# Patient Record
Sex: Female | Born: 1960 | Race: White | Hispanic: No | Smoking: Former smoker
Health system: Southern US, Community
[De-identification: ages and names within clinical notes are randomized; demographics above are authoritative.]

## PROBLEM LIST (undated history)

## (undated) DIAGNOSIS — G8929 Other chronic pain: Secondary | ICD-10-CM

## (undated) DIAGNOSIS — Z124 Encounter for screening for malignant neoplasm of cervix: Secondary | ICD-10-CM

## (undated) DIAGNOSIS — T7840XA Allergy, unspecified, initial encounter: Secondary | ICD-10-CM

## (undated) DIAGNOSIS — Z860100 Personal history of colon polyps, unspecified: Secondary | ICD-10-CM

## (undated) DIAGNOSIS — M1990 Unspecified osteoarthritis, unspecified site: Secondary | ICD-10-CM

## (undated) DIAGNOSIS — M109 Gout, unspecified: Secondary | ICD-10-CM

## (undated) DIAGNOSIS — Z0000 Encounter for general adult medical examination without abnormal findings: Secondary | ICD-10-CM

## (undated) DIAGNOSIS — M549 Dorsalgia, unspecified: Secondary | ICD-10-CM

## (undated) DIAGNOSIS — N83201 Unspecified ovarian cyst, right side: Secondary | ICD-10-CM

## (undated) DIAGNOSIS — F419 Anxiety disorder, unspecified: Secondary | ICD-10-CM

## (undated) DIAGNOSIS — M25561 Pain in right knee: Secondary | ICD-10-CM

## (undated) DIAGNOSIS — G4700 Insomnia, unspecified: Secondary | ICD-10-CM

## (undated) DIAGNOSIS — F329 Major depressive disorder, single episode, unspecified: Secondary | ICD-10-CM

## (undated) DIAGNOSIS — Z1239 Encounter for other screening for malignant neoplasm of breast: Secondary | ICD-10-CM

## (undated) DIAGNOSIS — F4310 Post-traumatic stress disorder, unspecified: Secondary | ICD-10-CM

## (undated) DIAGNOSIS — E559 Vitamin D deficiency, unspecified: Secondary | ICD-10-CM

## (undated) DIAGNOSIS — F418 Other specified anxiety disorders: Secondary | ICD-10-CM

## (undated) DIAGNOSIS — F32A Depression, unspecified: Secondary | ICD-10-CM

## (undated) DIAGNOSIS — G039 Meningitis, unspecified: Secondary | ICD-10-CM

## (undated) DIAGNOSIS — M79671 Pain in right foot: Secondary | ICD-10-CM

## (undated) DIAGNOSIS — J069 Acute upper respiratory infection, unspecified: Secondary | ICD-10-CM

## (undated) DIAGNOSIS — I10 Essential (primary) hypertension: Secondary | ICD-10-CM

## (undated) DIAGNOSIS — Z8739 Personal history of other diseases of the musculoskeletal system and connective tissue: Secondary | ICD-10-CM

## (undated) DIAGNOSIS — N83202 Unspecified ovarian cyst, left side: Secondary | ICD-10-CM

## (undated) HISTORY — DX: Dorsalgia, unspecified: M54.9

## (undated) HISTORY — DX: Vitamin D deficiency, unspecified: E55.9

## (undated) HISTORY — DX: Personal history of other diseases of the musculoskeletal system and connective tissue: Z87.39

## (undated) HISTORY — PX: COLONOSCOPY: SHX174

## (undated) HISTORY — DX: Morbid (severe) obesity due to excess calories: E66.01

## (undated) HISTORY — DX: Pain in right foot: M79.671

## (undated) HISTORY — DX: Allergy, unspecified, initial encounter: T78.40XA

## (undated) HISTORY — DX: Anxiety disorder, unspecified: F41.9

## (undated) HISTORY — DX: Unspecified ovarian cyst, right side: N83.201

## (undated) HISTORY — DX: Personal history of colonic polyps: Z86.010

## (undated) HISTORY — DX: Depression, unspecified: F32.A

## (undated) HISTORY — DX: Encounter for general adult medical examination without abnormal findings: Z00.00

## (undated) HISTORY — DX: Post-traumatic stress disorder, unspecified: F43.10

## (undated) HISTORY — DX: Other specified anxiety disorders: F41.8

## (undated) HISTORY — DX: Unspecified osteoarthritis, unspecified site: M19.90

## (undated) HISTORY — PX: OVARIAN CYST REMOVAL: SHX89

## (undated) HISTORY — DX: Gout, unspecified: M10.9

## (undated) HISTORY — DX: Other chronic pain: G89.29

## (undated) HISTORY — DX: Encounter for screening for malignant neoplasm of cervix: Z12.4

## (undated) HISTORY — DX: Insomnia, unspecified: G47.00

## (undated) HISTORY — DX: Personal history of colon polyps, unspecified: Z86.0100

## (undated) HISTORY — DX: Meningitis, unspecified: G03.9

## (undated) HISTORY — DX: Major depressive disorder, single episode, unspecified: F32.9

## (undated) HISTORY — DX: Essential (primary) hypertension: I10

## (undated) HISTORY — DX: Unspecified ovarian cyst, left side: N83.202

## (undated) HISTORY — DX: Acute upper respiratory infection, unspecified: J06.9

## (undated) HISTORY — DX: Encounter for other screening for malignant neoplasm of breast: Z12.39

## (undated) HISTORY — DX: Pain in right knee: M25.561

---

## 1999-03-28 ENCOUNTER — Other Ambulatory Visit: Admission: RE | Admit: 1999-03-28 | Discharge: 1999-03-28 | Payer: Self-pay | Admitting: *Deleted

## 1999-04-09 ENCOUNTER — Ambulatory Visit (HOSPITAL_COMMUNITY): Admission: RE | Admit: 1999-04-09 | Discharge: 1999-04-09 | Payer: Self-pay | Admitting: *Deleted

## 1999-04-23 ENCOUNTER — Ambulatory Visit (HOSPITAL_COMMUNITY): Admission: RE | Admit: 1999-04-23 | Discharge: 1999-04-23 | Payer: Self-pay | Admitting: *Deleted

## 2000-03-31 ENCOUNTER — Other Ambulatory Visit: Admission: RE | Admit: 2000-03-31 | Discharge: 2000-03-31 | Payer: Self-pay | Admitting: *Deleted

## 2001-05-30 ENCOUNTER — Encounter: Payer: Self-pay | Admitting: Emergency Medicine

## 2001-05-30 ENCOUNTER — Emergency Department (HOSPITAL_COMMUNITY): Admission: EM | Admit: 2001-05-30 | Discharge: 2001-05-30 | Payer: Self-pay | Admitting: *Deleted

## 2002-12-23 ENCOUNTER — Emergency Department (HOSPITAL_COMMUNITY): Admission: EM | Admit: 2002-12-23 | Discharge: 2002-12-23 | Payer: Self-pay | Admitting: Emergency Medicine

## 2002-12-23 ENCOUNTER — Encounter: Payer: Self-pay | Admitting: Emergency Medicine

## 2003-07-14 ENCOUNTER — Encounter: Payer: Self-pay | Admitting: Emergency Medicine

## 2003-07-14 ENCOUNTER — Emergency Department (HOSPITAL_COMMUNITY): Admission: AC | Admit: 2003-07-14 | Discharge: 2003-07-14 | Payer: Self-pay

## 2004-02-09 ENCOUNTER — Other Ambulatory Visit: Admission: RE | Admit: 2004-02-09 | Discharge: 2004-02-09 | Payer: Self-pay | Admitting: Obstetrics and Gynecology

## 2004-03-12 ENCOUNTER — Ambulatory Visit (HOSPITAL_COMMUNITY): Admission: RE | Admit: 2004-03-12 | Discharge: 2004-03-12 | Payer: Self-pay | Admitting: Obstetrics and Gynecology

## 2005-11-20 ENCOUNTER — Ambulatory Visit: Payer: Self-pay | Admitting: Family Medicine

## 2007-02-09 ENCOUNTER — Ambulatory Visit: Payer: Self-pay | Admitting: Family Medicine

## 2007-07-15 ENCOUNTER — Telehealth (INDEPENDENT_AMBULATORY_CARE_PROVIDER_SITE_OTHER): Payer: Self-pay | Admitting: *Deleted

## 2007-07-21 ENCOUNTER — Telehealth: Payer: Self-pay | Admitting: Family Medicine

## 2007-07-22 ENCOUNTER — Telehealth: Payer: Self-pay | Admitting: Family Medicine

## 2007-08-17 ENCOUNTER — Telehealth: Payer: Self-pay | Admitting: Family Medicine

## 2008-03-09 ENCOUNTER — Ambulatory Visit: Payer: Self-pay | Admitting: Family Medicine

## 2008-03-09 DIAGNOSIS — M5416 Radiculopathy, lumbar region: Secondary | ICD-10-CM | POA: Insufficient documentation

## 2008-03-09 DIAGNOSIS — F411 Generalized anxiety disorder: Secondary | ICD-10-CM | POA: Insufficient documentation

## 2008-03-09 DIAGNOSIS — G4700 Insomnia, unspecified: Secondary | ICD-10-CM | POA: Insufficient documentation

## 2008-03-09 DIAGNOSIS — M545 Low back pain: Secondary | ICD-10-CM

## 2008-04-05 ENCOUNTER — Emergency Department (HOSPITAL_BASED_OUTPATIENT_CLINIC_OR_DEPARTMENT_OTHER): Admission: EM | Admit: 2008-04-05 | Discharge: 2008-04-05 | Payer: Self-pay | Admitting: Emergency Medicine

## 2008-04-05 ENCOUNTER — Telehealth: Payer: Self-pay | Admitting: Family Medicine

## 2008-04-06 ENCOUNTER — Telehealth: Payer: Self-pay | Admitting: Family Medicine

## 2008-04-07 ENCOUNTER — Telehealth: Payer: Self-pay | Admitting: Family Medicine

## 2008-04-11 ENCOUNTER — Telehealth: Payer: Self-pay | Admitting: Family Medicine

## 2008-05-18 ENCOUNTER — Telehealth: Payer: Self-pay | Admitting: Family Medicine

## 2008-05-24 ENCOUNTER — Ambulatory Visit: Payer: Self-pay | Admitting: Family Medicine

## 2008-05-24 ENCOUNTER — Telehealth: Payer: Self-pay | Admitting: Family Medicine

## 2008-08-23 ENCOUNTER — Ambulatory Visit: Payer: Self-pay | Admitting: Family Medicine

## 2008-08-23 DIAGNOSIS — F418 Other specified anxiety disorders: Secondary | ICD-10-CM | POA: Insufficient documentation

## 2008-08-23 HISTORY — DX: Other specified anxiety disorders: F41.8

## 2008-09-21 ENCOUNTER — Telehealth: Payer: Self-pay | Admitting: Family Medicine

## 2008-10-05 ENCOUNTER — Telehealth: Payer: Self-pay | Admitting: Family Medicine

## 2008-10-12 ENCOUNTER — Ambulatory Visit: Payer: Self-pay | Admitting: Family Medicine

## 2008-10-12 DIAGNOSIS — E039 Hypothyroidism, unspecified: Secondary | ICD-10-CM | POA: Insufficient documentation

## 2008-10-17 ENCOUNTER — Telehealth: Payer: Self-pay | Admitting: Family Medicine

## 2008-10-31 ENCOUNTER — Telehealth: Payer: Self-pay | Admitting: Family Medicine

## 2008-11-11 ENCOUNTER — Encounter: Payer: Self-pay | Admitting: Family Medicine

## 2008-11-14 ENCOUNTER — Telehealth: Payer: Self-pay | Admitting: Family Medicine

## 2008-11-15 ENCOUNTER — Telehealth: Payer: Self-pay | Admitting: Family Medicine

## 2008-11-28 ENCOUNTER — Ambulatory Visit: Payer: Self-pay | Admitting: Family Medicine

## 2008-11-28 DIAGNOSIS — R0789 Other chest pain: Secondary | ICD-10-CM | POA: Insufficient documentation

## 2008-11-28 DIAGNOSIS — E785 Hyperlipidemia, unspecified: Secondary | ICD-10-CM | POA: Insufficient documentation

## 2008-11-28 DIAGNOSIS — R079 Chest pain, unspecified: Secondary | ICD-10-CM | POA: Insufficient documentation

## 2008-12-06 ENCOUNTER — Ambulatory Visit: Payer: Self-pay | Admitting: Cardiovascular Disease

## 2008-12-07 ENCOUNTER — Ambulatory Visit: Payer: Self-pay

## 2008-12-14 ENCOUNTER — Ambulatory Visit: Payer: Self-pay

## 2008-12-14 ENCOUNTER — Encounter: Payer: Self-pay | Admitting: Cardiovascular Disease

## 2008-12-18 ENCOUNTER — Encounter: Payer: Self-pay | Admitting: Cardiovascular Disease

## 2008-12-18 ENCOUNTER — Ambulatory Visit: Payer: Self-pay | Admitting: Cardiovascular Disease

## 2008-12-18 DIAGNOSIS — R609 Edema, unspecified: Secondary | ICD-10-CM | POA: Insufficient documentation

## 2008-12-18 DIAGNOSIS — R002 Palpitations: Secondary | ICD-10-CM | POA: Insufficient documentation

## 2008-12-19 LAB — CONVERTED CEMR LAB
BUN: 11 mg/dL (ref 6–23)
CO2: 32 meq/L (ref 19–32)
Calcium: 10.2 mg/dL (ref 8.4–10.5)
Chloride: 101 meq/L (ref 96–112)
Creatinine, Ser: 0.6 mg/dL (ref 0.4–1.2)
GFR calc non Af Amer: 113.82 mL/min (ref >60)
Glucose, Bld: 123 mg/dL — ABNORMAL HIGH (ref 70–99)
Potassium: 4.3 meq/L (ref 3.5–5.1)
Sodium: 140 meq/L (ref 135–145)
TSH: 0.74 microintl units/mL (ref 0.35–5.50)

## 2009-01-01 ENCOUNTER — Ambulatory Visit: Payer: Self-pay | Admitting: Family Medicine

## 2009-01-01 LAB — CONVERTED CEMR LAB
Bilirubin Urine: NEGATIVE
Blood in Urine, dipstick: NEGATIVE
Glucose, Urine, Semiquant: NEGATIVE
Ketones, urine, test strip: NEGATIVE
Nitrite: NEGATIVE
Protein, U semiquant: NEGATIVE
Specific Gravity, Urine: 1.01
Urobilinogen, UA: 0.2
WBC Urine, dipstick: NEGATIVE
pH: 6

## 2009-01-09 ENCOUNTER — Ambulatory Visit: Payer: Self-pay | Admitting: Family Medicine

## 2009-01-09 ENCOUNTER — Other Ambulatory Visit: Admission: RE | Admit: 2009-01-09 | Discharge: 2009-01-09 | Payer: Self-pay | Admitting: Family Medicine

## 2009-01-09 ENCOUNTER — Encounter: Payer: Self-pay | Admitting: Family Medicine

## 2009-01-09 LAB — CONVERTED CEMR LAB
ALT: 33 units/L (ref 0–35)
AST: 27 units/L (ref 0–37)
Albumin: 3.8 g/dL (ref 3.5–5.2)
Alkaline Phosphatase: 57 units/L (ref 39–117)
BUN: 25 mg/dL — ABNORMAL HIGH (ref 6–23)
Basophils Absolute: 0 10*3/uL (ref 0.0–0.1)
Basophils Relative: 0.5 % (ref 0.0–3.0)
Bilirubin, Direct: 0.2 mg/dL (ref 0.0–0.3)
CO2: 29 meq/L (ref 19–32)
Calcium: 10.4 mg/dL (ref 8.4–10.5)
Chloride: 104 meq/L (ref 96–112)
Cholesterol: 218 mg/dL — ABNORMAL HIGH (ref 0–200)
Creatinine, Ser: 1 mg/dL (ref 0.4–1.2)
Direct LDL: 155.5 mg/dL
Eosinophils Absolute: 0.2 10*3/uL (ref 0.0–0.7)
Eosinophils Relative: 2.2 % (ref 0.0–5.0)
GFR calc non Af Amer: 63.11 mL/min (ref >60)
Glucose, Bld: 121 mg/dL — ABNORMAL HIGH (ref 70–99)
HCT: 37.8 % (ref 36.0–46.0)
HDL: 48.3 mg/dL (ref >39.00)
Hemoglobin: 13.1 g/dL (ref 12.0–15.0)
Hgb A1c MFr Bld: 6.1 % (ref 4.6–6.5)
Lymphocytes Relative: 32.2 % (ref 12.0–46.0)
Lymphs Abs: 2.2 10*3/uL (ref 0.7–4.0)
MCHC: 34.6 g/dL (ref 30.0–36.0)
MCV: 96.5 fL (ref 78.0–100.0)
Monocytes Absolute: 0.7 10*3/uL (ref 0.1–1.0)
Monocytes Relative: 9.5 % (ref 3.0–12.0)
Neutro Abs: 3.8 10*3/uL (ref 1.4–7.7)
Neutrophils Relative %: 55.6 % (ref 43.0–77.0)
Platelets: 188 10*3/uL (ref 150.0–400.0)
Potassium: 4.7 meq/L (ref 3.5–5.1)
RBC: 3.91 M/uL (ref 3.87–5.11)
RDW: 13 % (ref 11.5–14.6)
Sodium: 145 meq/L (ref 135–145)
TSH: 1.14 microintl units/mL (ref 0.35–5.50)
Total Bilirubin: 1 mg/dL (ref 0.3–1.2)
Total CHOL/HDL Ratio: 5
Total Protein: 6.3 g/dL (ref 6.0–8.3)
Triglycerides: 156 mg/dL — ABNORMAL HIGH (ref 0.0–149.0)
VLDL: 31.2 mg/dL (ref 0.0–40.0)
WBC: 6.9 10*3/uL (ref 4.5–10.5)

## 2009-01-16 ENCOUNTER — Encounter: Payer: Self-pay | Admitting: Family Medicine

## 2009-01-18 ENCOUNTER — Ambulatory Visit: Payer: Self-pay | Admitting: Family Medicine

## 2009-01-18 DIAGNOSIS — K119 Disease of salivary gland, unspecified: Secondary | ICD-10-CM | POA: Insufficient documentation

## 2009-01-18 LAB — CONVERTED CEMR LAB
Basophils Absolute: 0.1 10*3/uL (ref 0.0–0.1)
Basophils Relative: 0.8 % (ref 0.0–3.0)
Eosinophils Absolute: 0.3 10*3/uL (ref 0.0–0.7)
Eosinophils Relative: 3.7 % (ref 0.0–5.0)
HCT: 34.5 % — ABNORMAL LOW (ref 36.0–46.0)
Hemoglobin: 12.3 g/dL (ref 12.0–15.0)
Lymphocytes Relative: 30.3 % (ref 12.0–46.0)
Lymphs Abs: 2.8 10*3/uL (ref 0.7–4.0)
MCHC: 35.5 g/dL (ref 30.0–36.0)
MCV: 95.1 fL (ref 78.0–100.0)
Monocytes Absolute: 0.8 10*3/uL (ref 0.1–1.0)
Monocytes Relative: 8.2 % (ref 3.0–12.0)
Neutro Abs: 5.3 10*3/uL (ref 1.4–7.7)
Neutrophils Relative %: 57 % (ref 43.0–77.0)
Platelets: 202 10*3/uL (ref 150.0–400.0)
RBC: 3.63 M/uL — ABNORMAL LOW (ref 3.87–5.11)
RDW: 13 % (ref 11.5–14.6)
WBC: 9.3 10*3/uL (ref 4.5–10.5)

## 2009-01-23 ENCOUNTER — Telehealth: Payer: Self-pay | Admitting: Family Medicine

## 2009-02-06 ENCOUNTER — Ambulatory Visit (HOSPITAL_COMMUNITY): Admission: RE | Admit: 2009-02-06 | Discharge: 2009-02-06 | Payer: Self-pay | Admitting: Family Medicine

## 2009-02-06 LAB — HM MAMMOGRAPHY

## 2009-02-21 ENCOUNTER — Ambulatory Visit: Payer: Self-pay | Admitting: Family Medicine

## 2009-03-13 ENCOUNTER — Telehealth (INDEPENDENT_AMBULATORY_CARE_PROVIDER_SITE_OTHER): Payer: Self-pay | Admitting: *Deleted

## 2009-03-20 ENCOUNTER — Telehealth: Payer: Self-pay | Admitting: Family Medicine

## 2009-05-22 ENCOUNTER — Telehealth (INDEPENDENT_AMBULATORY_CARE_PROVIDER_SITE_OTHER): Payer: Self-pay | Admitting: *Deleted

## 2009-05-24 ENCOUNTER — Ambulatory Visit: Payer: Self-pay | Admitting: Family Medicine

## 2009-05-24 DIAGNOSIS — M1710 Unilateral primary osteoarthritis, unspecified knee: Secondary | ICD-10-CM | POA: Insufficient documentation

## 2009-05-24 DIAGNOSIS — M5380 Other specified dorsopathies, site unspecified: Secondary | ICD-10-CM | POA: Insufficient documentation

## 2009-05-24 DIAGNOSIS — IMO0002 Reserved for concepts with insufficient information to code with codable children: Secondary | ICD-10-CM

## 2009-05-24 DIAGNOSIS — I10 Essential (primary) hypertension: Secondary | ICD-10-CM | POA: Insufficient documentation

## 2009-05-28 ENCOUNTER — Encounter (INDEPENDENT_AMBULATORY_CARE_PROVIDER_SITE_OTHER): Payer: Self-pay | Admitting: *Deleted

## 2009-08-22 ENCOUNTER — Ambulatory Visit: Payer: Self-pay | Admitting: Family Medicine

## 2009-11-22 ENCOUNTER — Ambulatory Visit: Payer: Self-pay | Admitting: Family Medicine

## 2009-12-20 ENCOUNTER — Telehealth: Payer: Self-pay | Admitting: Family Medicine

## 2010-02-19 ENCOUNTER — Ambulatory Visit: Payer: Self-pay | Admitting: Family Medicine

## 2010-04-22 ENCOUNTER — Ambulatory Visit: Payer: Self-pay | Admitting: Family Medicine

## 2010-04-30 ENCOUNTER — Ambulatory Visit: Payer: Self-pay | Admitting: Family Medicine

## 2010-04-30 ENCOUNTER — Other Ambulatory Visit: Admission: RE | Admit: 2010-04-30 | Discharge: 2010-04-30 | Payer: Self-pay | Admitting: Family Medicine

## 2010-04-30 DIAGNOSIS — R7309 Other abnormal glucose: Secondary | ICD-10-CM | POA: Insufficient documentation

## 2010-04-30 DIAGNOSIS — N951 Menopausal and female climacteric states: Secondary | ICD-10-CM | POA: Insufficient documentation

## 2010-04-30 LAB — CONVERTED CEMR LAB
ALT: 51 units/L — ABNORMAL HIGH (ref 0–35)
AST: 52 units/L — ABNORMAL HIGH (ref 0–37)
Albumin: 4.7 g/dL (ref 3.5–5.2)
Alkaline Phosphatase: 84 units/L (ref 39–117)
BUN: 11 mg/dL (ref 6–23)
Basophils Absolute: 0 10*3/uL (ref 0.0–0.1)
Basophils Relative: 0.4 % (ref 0.0–3.0)
Bilirubin Urine: NEGATIVE
Bilirubin, Direct: 0.2 mg/dL (ref 0.0–0.3)
Blood in Urine, dipstick: NEGATIVE
CO2: 31 meq/L (ref 19–32)
Calcium: 10.6 mg/dL — ABNORMAL HIGH (ref 8.4–10.5)
Chloride: 97 meq/L (ref 96–112)
Cholesterol: 290 mg/dL — ABNORMAL HIGH (ref 0–200)
Creatinine, Ser: 0.8 mg/dL (ref 0.4–1.2)
Direct LDL: 204.2 mg/dL
Eosinophils Absolute: 0.1 10*3/uL (ref 0.0–0.7)
Eosinophils Relative: 1.9 % (ref 0.0–5.0)
GFR calc non Af Amer: 77.82 mL/min (ref >60)
Glucose, Bld: 135 mg/dL — ABNORMAL HIGH (ref 70–99)
Glucose, Urine, Semiquant: NEGATIVE
HCT: 42.7 % (ref 36.0–46.0)
HDL: 52.6 mg/dL (ref >39.00)
Hemoglobin: 14.9 g/dL (ref 12.0–15.0)
Ketones, urine, test strip: NEGATIVE
Lymphocytes Relative: 19.8 % (ref 12.0–46.0)
Lymphs Abs: 1.5 10*3/uL (ref 0.7–4.0)
MCHC: 34.9 g/dL (ref 30.0–36.0)
MCV: 95 fL (ref 78.0–100.0)
Monocytes Absolute: 0.5 10*3/uL (ref 0.1–1.0)
Monocytes Relative: 6.4 % (ref 3.0–12.0)
Neutro Abs: 5.5 10*3/uL (ref 1.4–7.7)
Neutrophils Relative %: 71.5 % (ref 43.0–77.0)
Nitrite: NEGATIVE
Platelets: 231 10*3/uL (ref 150.0–400.0)
Potassium: 3.7 meq/L (ref 3.5–5.1)
Protein, U semiquant: NEGATIVE
RBC: 4.49 M/uL (ref 3.87–5.11)
RDW: 13.4 % (ref 11.5–14.6)
Sodium: 136 meq/L (ref 135–145)
Specific Gravity, Urine: 1.01
TSH: 1.51 microintl units/mL (ref 0.35–5.50)
Total Bilirubin: 1.1 mg/dL (ref 0.3–1.2)
Total CHOL/HDL Ratio: 6
Total Protein: 7.9 g/dL (ref 6.0–8.3)
Triglycerides: 273 mg/dL — ABNORMAL HIGH (ref 0.0–149.0)
Urobilinogen, UA: 0.2
VLDL: 54.6 mg/dL — ABNORMAL HIGH (ref 0.0–40.0)
WBC Urine, dipstick: NEGATIVE
WBC: 7.8 10*3/uL (ref 4.5–10.5)
pH: 5

## 2010-04-30 LAB — HM PAP SMEAR

## 2010-05-02 LAB — CONVERTED CEMR LAB
FSH: 48.1 milliintl units/mL
Hgb A1c MFr Bld: 5.7 % (ref 4.6–6.5)

## 2010-05-14 ENCOUNTER — Encounter: Payer: Self-pay | Admitting: Family Medicine

## 2010-05-14 LAB — CONVERTED CEMR LAB: Pap Smear: NEGATIVE

## 2010-06-24 ENCOUNTER — Telehealth (INDEPENDENT_AMBULATORY_CARE_PROVIDER_SITE_OTHER): Payer: Self-pay | Admitting: *Deleted

## 2010-07-01 ENCOUNTER — Ambulatory Visit: Payer: Self-pay | Admitting: Family Medicine

## 2010-07-01 DIAGNOSIS — K5900 Constipation, unspecified: Secondary | ICD-10-CM | POA: Insufficient documentation

## 2010-07-01 DIAGNOSIS — E669 Obesity, unspecified: Secondary | ICD-10-CM | POA: Insufficient documentation

## 2010-07-23 ENCOUNTER — Telehealth (INDEPENDENT_AMBULATORY_CARE_PROVIDER_SITE_OTHER): Payer: Self-pay | Admitting: *Deleted

## 2010-08-19 ENCOUNTER — Ambulatory Visit: Payer: Self-pay | Admitting: Family Medicine

## 2010-09-17 ENCOUNTER — Ambulatory Visit: Payer: Self-pay | Admitting: Family Medicine

## 2010-10-02 ENCOUNTER — Telehealth: Payer: Self-pay | Admitting: Family Medicine

## 2010-10-03 ENCOUNTER — Telehealth: Payer: Self-pay | Admitting: Family Medicine

## 2010-10-07 ENCOUNTER — Telehealth (INDEPENDENT_AMBULATORY_CARE_PROVIDER_SITE_OTHER): Payer: Self-pay | Admitting: *Deleted

## 2010-10-14 ENCOUNTER — Emergency Department (HOSPITAL_BASED_OUTPATIENT_CLINIC_OR_DEPARTMENT_OTHER)
Admission: EM | Admit: 2010-10-14 | Discharge: 2010-10-15 | Payer: Self-pay | Source: Home / Self Care | Admitting: Emergency Medicine

## 2010-10-16 ENCOUNTER — Ambulatory Visit: Admit: 2010-10-16 | Payer: Self-pay | Admitting: Internal Medicine

## 2010-10-16 ENCOUNTER — Telehealth: Payer: Self-pay | Admitting: Family Medicine

## 2010-10-16 LAB — CBC
HCT: 40.7 % (ref 36.0–46.0)
Hemoglobin: 14.1 g/dL (ref 12.0–15.0)
MCH: 31.8 pg (ref 26.0–34.0)
MCHC: 34.6 g/dL (ref 30.0–36.0)
MCV: 91.7 fL (ref 78.0–100.0)
Platelets: 206 10*3/uL (ref 150–400)
RBC: 4.44 MIL/uL (ref 3.87–5.11)
RDW: 12.8 % (ref 11.5–15.5)
WBC: 12.7 10*3/uL — ABNORMAL HIGH (ref 4.0–10.5)

## 2010-10-16 LAB — DIFFERENTIAL
Basophils Absolute: 0 10*3/uL (ref 0.0–0.1)
Basophils Relative: 0 % (ref 0–1)
Eosinophils Absolute: 0.3 10*3/uL (ref 0.0–0.7)
Eosinophils Relative: 3 % (ref 0–5)
Lymphocytes Relative: 18 % (ref 12–46)
Lymphs Abs: 2.3 10*3/uL (ref 0.7–4.0)
Monocytes Absolute: 0.8 10*3/uL (ref 0.1–1.0)
Monocytes Relative: 6 % (ref 3–12)
Neutro Abs: 9.3 10*3/uL — ABNORMAL HIGH (ref 1.7–7.7)
Neutrophils Relative %: 73 % (ref 43–77)

## 2010-10-16 LAB — POCT CARDIAC MARKERS
CKMB, poc: <1 ng/mL — ABNORMAL LOW (ref 1.0–8.0)
Myoglobin, poc: 28.3 ng/mL (ref 12–200)
Troponin i, poc: <0.05 ng/mL (ref 0.00–0.09)

## 2010-10-16 LAB — BASIC METABOLIC PANEL
BUN: 10 mg/dL (ref 6–23)
CO2: 22 mEq/L (ref 19–32)
Calcium: 10.3 mg/dL (ref 8.4–10.5)
Chloride: 107 mEq/L (ref 96–112)
Creatinine, Ser: 0.6 mg/dL (ref 0.4–1.2)
GFR calc Af Amer: >60 mL/min (ref >60)
GFR calc non Af Amer: >60 mL/min (ref >60)
Glucose, Bld: 110 mg/dL — ABNORMAL HIGH (ref 70–99)
Potassium: 4.2 mEq/L (ref 3.5–5.1)
Sodium: 142 mEq/L (ref 135–145)

## 2010-10-17 ENCOUNTER — Other Ambulatory Visit: Payer: Self-pay | Admitting: Internal Medicine

## 2010-10-17 ENCOUNTER — Telehealth: Payer: Self-pay | Admitting: Internal Medicine

## 2010-10-17 ENCOUNTER — Encounter: Payer: Self-pay | Admitting: Family Medicine

## 2010-10-17 ENCOUNTER — Ambulatory Visit
Admission: RE | Admit: 2010-10-17 | Discharge: 2010-10-17 | Payer: Self-pay | Source: Home / Self Care | Attending: Internal Medicine | Admitting: Internal Medicine

## 2010-10-17 DIAGNOSIS — R109 Unspecified abdominal pain: Secondary | ICD-10-CM | POA: Insufficient documentation

## 2010-10-17 LAB — HEPATIC FUNCTION PANEL
ALT: 25 U/L (ref 0–35)
AST: 32 U/L (ref 0–37)
Albumin: 4 g/dL (ref 3.5–5.2)
Alkaline Phosphatase: 89 U/L (ref 39–117)
Bilirubin, Direct: 0.2 mg/dL (ref 0.0–0.3)
Total Bilirubin: 0.7 mg/dL (ref 0.3–1.2)
Total Protein: 7.4 g/dL (ref 6.0–8.3)

## 2010-10-17 LAB — AMYLASE: Amylase: 57 U/L (ref 27–131)

## 2010-10-17 LAB — LIPASE: Lipase: 185 U/L — ABNORMAL HIGH (ref 11.0–59.0)

## 2010-10-18 ENCOUNTER — Telehealth: Payer: Self-pay

## 2010-10-18 ENCOUNTER — Telehealth: Payer: Self-pay | Admitting: Cardiovascular Disease

## 2010-10-18 DIAGNOSIS — IMO0001 Reserved for inherently not codable concepts without codable children: Secondary | ICD-10-CM | POA: Insufficient documentation

## 2010-10-21 ENCOUNTER — Encounter: Payer: Self-pay | Admitting: Family Medicine

## 2010-10-27 LAB — CONVERTED CEMR LAB
ALT: 60 units/L — ABNORMAL HIGH (ref 0–35)
AST: 29 units/L (ref 0–37)
Albumin: 4 g/dL (ref 3.5–5.2)
Alkaline Phosphatase: 59 units/L (ref 39–117)
BUN: 12 mg/dL (ref 6–23)
Basophils Absolute: 0 10*3/uL (ref 0.0–0.1)
Basophils Relative: 0.2 % (ref 0.0–3.0)
Bilirubin, Direct: 0.1 mg/dL (ref 0.0–0.3)
CO2: 28 meq/L (ref 19–32)
Calcium: 10.4 mg/dL (ref 8.4–10.5)
Chloride: 104 meq/L (ref 96–112)
Cholesterol: 175 mg/dL (ref 0–200)
Creatinine, Ser: 0.5 mg/dL (ref 0.4–1.2)
Eosinophils Absolute: 0.2 10*3/uL (ref 0.0–0.7)
Eosinophils Relative: 2.8 % (ref 0.0–5.0)
GFR calc Af Amer: 170 mL/min
GFR calc non Af Amer: 141 mL/min
Glucose, Bld: 100 mg/dL — ABNORMAL HIGH (ref 70–99)
HCT: 41.6 % (ref 36.0–46.0)
HDL: 36.7 mg/dL — ABNORMAL LOW (ref >39.0)
Hemoglobin: 14.5 g/dL (ref 12.0–15.0)
LDL Cholesterol: 113 mg/dL — ABNORMAL HIGH (ref 0–99)
Lymphocytes Relative: 27.8 % (ref 12.0–46.0)
MCHC: 34.8 g/dL (ref 30.0–36.0)
MCV: 97.1 fL (ref 78.0–100.0)
Monocytes Absolute: 0.5 10*3/uL (ref 0.1–1.0)
Monocytes Relative: 6.7 % (ref 3.0–12.0)
Neutro Abs: 4.4 10*3/uL (ref 1.4–7.7)
Neutrophils Relative %: 62.5 % (ref 43.0–77.0)
Platelets: 233 10*3/uL (ref 150–400)
Potassium: 3.6 meq/L (ref 3.5–5.1)
RBC: 4.28 M/uL (ref 3.87–5.11)
RDW: 12.7 % (ref 11.5–14.6)
Sodium: 140 meq/L (ref 135–145)
Total Bilirubin: 0.7 mg/dL (ref 0.3–1.2)
Total CHOL/HDL Ratio: 4.8
Total Protein: 7.2 g/dL (ref 6.0–8.3)
Triglycerides: 127 mg/dL (ref 0–149)
VLDL: 25 mg/dL (ref 0–40)
WBC: 7 10*3/uL (ref 4.5–10.5)

## 2010-10-29 NOTE — Progress Notes (Signed)
Summary: REQ FOR RETURN CALL  Phone Note Call from Patient   Caller: Patient  [phone] Summary of Call: Pt would like a return call to  [phone] so she can confirm whether or not her medication will be refilled by Dr Abner Greenspan if she comes in for OV and pays the cost of same due to pt having to pay out of pocket due to no insurance.... Pt adv she can't afford going to pain clinic at this time but needs medications refilled.... Appt she has scheduled with Dr Scotty Court has been bumped and has been rescheduled to Dec. 20, 2011 (adv meds will run out before then)...Marland KitchenMarland KitchenAlso, pt adv that she is planning on applying for disability and will need documentation in order for same?  Initial call taken by: Debbra Riding,  July 23, 2010 2:08 PM  Follow-up for Phone Call        I will cont her Oxycodone at [facility] dosing for November unchanged when it is due but I am not a disability doctor so I will not be able to do long term paper work for her, she would have to talk to Dr Scotty Court when he gets back or see a disability doctor Follow-up by: Danise Edge MD,  July 23, 2010 5:19 PM  Additional Follow-up for Phone Call Additional follow up Details #1::        Patient informed. Additional Follow-up by: Josph Macho RMA,  July 24, 2010 8:39 AM

## 2010-10-29 NOTE — Assessment & Plan Note (Signed)
Summary: med check//ccm rsc bmp/njr   Vital Signs:  Patient profile:   50 year old female Weight:      230 pounds Temp:     98.4 degrees F oral Pulse rate:   78 / minute BP sitting:   118 / 78  (left arm) Cuff size:   large  Vitals Entered By: Alfred Levins, CMA (August 19, 2010 3:09 PM) CC: renew meds   History of Present Illness: Patient is a 50 yo caucasian female in today concerned about some increasing pain. She is concerned about her FH of Rheumatoid Arthritis in her father and in a paternal grandfather. Also notes her paternal grandmother has been diagnosed with fibromyalgia. Patient is concerned regarding pain in her hands/stiffness is bad enough to make it difficult to make a fist. Patient also notes increased pain and stiffness in b/l gluteal muscles and now same discomfort behind b/l knees. Patient also notes some burning in b/l thighs for days after even minimal exercise. Has been having redness and swelling in her ankles but not in the hands. The stiffness in the hands does not follow a pattern (ie am vs pm). She is post menopausal, denies hot flashes. Is struggling with some irritability and low grade deprssion. Denies suicidal ideation  Current Medications (verified): 1)  Etodolac 400 Mg  Tabs (Etodolac) .Marland Kitchen.. 1 Tab Two Times A Day Pc 2)  Lisinopril 10 Mg Tabs (Lisinopril) .Marland Kitchen.. 1 By Mouth Once Daily 3)  Alprazolam 1 Mg Tabs (Alprazolam) .Marland Kitchen.. 1 Three Times A Day For Stress 4)  Zolpidem Tartrate 10 Mg Tabs (Zolpidem Tartrate) .Marland Kitchen.. 1 By Mouth Q Hs As Needed Sleep 5)  Triamterene-Hctz 75-50 Mg Tabs (Triamterene-Hctz) .Marland Kitchen.. 1 Tab As Needed Edema 6)  Toprol Xl 25 Mg Xr24h-Tab (Metoprolol Succinate) .Marland Kitchen.. 1 Tab Daily 7)  Cyclobenzaprine Hcl 10 Mg Tabs (Cyclobenzaprine Hcl) .Marland Kitchen.. 1 By Mouth Three Times A Day For Muscle Spasm 8)  Oxycodone Hcl 20 Mg Tabs (Oxycodone Hcl) .Marland Kitchen.. 1 Q4h As Needed Pain , Not Over 4 Per Day Fill Oct 24, May Add These 30 Tabs To The Rx Provided By Her Pmd in  August For October That Is Already On File  Allergies (verified): No Known Drug Allergies  Past History:  Past medical history reviewed for relevance to current acute and chronic problems. Social history (including risk factors) reviewed for relevance to current acute and chronic problems.  Past Medical History: Reviewed history from 02/19/2010 and no changes required. 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  Review of Systems      See HPI  Physical Exam  General:  Well-developed,well-nourished,in no acute distress; alert,appropriate and cooperative throughout examination Head:  Normocephalic and atraumatic without obvious abnormalities. No apparent alopecia or balding. Nose:  External nasal examination shows no deformity or inflammation. Nasal mucosa are pink and moist without lesions or exudates. Mouth:  Oral mucosa and oropharynx without lesions or exudates.  Teeth in good repair. Neck:  No deformities, masses, or tenderness noted. Lungs:  Normal respiratory effort, chest expands symmetrically. Lungs are clear to auscultation, no crackles or wheezes. Heart:  Normal rate and regular rhythm. S1 and S2 normal without gallop, murmur, click, rub or other extra sounds. Abdomen:  Bowel sounds positive,abdomen soft and non-tender without masses, organomegaly or hernias noted. Msk:  No deformity or scoliosis noted of thoracic or lumbar spine.   Pulses:  R and L posterior tibial pulses are full and equal bilaterally Extremities:  No clubbing, cyanosis, edema, or deformity noted with normal full range of motion of all joints.   Neurologic:  No cranial nerve deficits noted. Station and gait are normal. Plantar reflexes are down-going bilaterally. DTRs are symmetrical throughout. Sensory, motor and coordinative functions appear intact. Skin:  Intact without suspicious lesions or rashes Cervical Nodes:  No lymphadenopathy noted Psych:  Cognition  and judgment appear intact. Alert and cooperative with normal attention span and concentration. No apparent delusions, illusions, hallucinations   Impression & Recommendations:  Problem # 1:  DEPRESSION (ICD-311)  Her updated medication list for this problem includes:    Alprazolam 1 Mg Tabs (Alprazolam) .Marland Kitchen... 1 three times a day for stress    Cymbalta 30 Mg Cpep (Duloxetine hcl) .Marland Kitchen... 1 cap by mouth daily x 7 days then increase to 60mg  daily    Cymbalta 60 Mg Cpep (Duloxetine hcl) .Marland Kitchen... 1 cap by mouth daily aftewr a week of 30mg  tabs Consider fibromyalgia, will start above meds, cont current meds and have her f/u with PMD next month  Problem # 2:  ESSENTIAL HYPERTENSION (ICD-401.9)  Her updated medication list for this problem includes:    Lisinopril 10 Mg Tabs (Lisinopril) .Marland Kitchen... 1 by mouth once daily    Triamterene-hctz 75-50 Mg Tabs (Triamterene-hctz) .Marland Kitchen... 1 tab as needed edema    Toprol Xl 25 Mg Xr24h-tab (Metoprolol succinate) .Marland Kitchen... 1 tab daily Adequate control no changes today  Complete Medication List: 1)  Etodolac 400 Mg Tabs (Etodolac) .Marland Kitchen.. 1 tab two times a day pc 2)  Lisinopril 10 Mg Tabs (Lisinopril) .Marland Kitchen.. 1 by mouth once daily 3)  Alprazolam 1 Mg Tabs (Alprazolam) .Marland Kitchen.. 1 three times a day for stress 4)  Zolpidem Tartrate 10 Mg Tabs (Zolpidem tartrate) .Marland Kitchen.. 1 by mouth q hs as needed sleep 5)  Triamterene-hctz 75-50 Mg Tabs (Triamterene-hctz) .Marland Kitchen.. 1 tab as needed edema 6)  Toprol Xl 25 Mg Xr24h-tab (Metoprolol succinate) .Marland Kitchen.. 1 tab daily 7)  Cyclobenzaprine Hcl 10 Mg Tabs (Cyclobenzaprine hcl) .Marland Kitchen.. 1 by mouth three times a day for muscle spasm 8)  Oxycodone Hcl 20 Mg Tabs (Oxycodone hcl) .Marland Kitchen.. 1 q4h as needed pain , not over 4 per 24 hour period, to fill on 08/20/2010 9)  Cymbalta 30 Mg Cpep (Duloxetine hcl) .Marland Kitchen.. 1 cap by mouth daily x 7 days then increase to 60mg  daily 10)  Cymbalta 60 Mg Cpep (Duloxetine hcl) .Marland Kitchen.. 1 cap by mouth daily aftewr a week of 30mg  tabs  Patient  Instructions: 1)  Please schedule a follow-up appointment as needed, has appt with Dr Scotty Court on 09/17/2010. Prescriptions: OXYCODONE HCL 20 MG TABS (OXYCODONE HCL) 1 q4h as needed pain , not over 4 per 24 hour period, to fill on 08/20/2010  #120 x 0   Entered and Authorized by:   Danise Edge MD   Signed by:   Danise Edge MD on 08/19/2010   Method used:   Print then Give to Patient   RxID:   [id number]    Orders Added: 1)  Est. Patient Level III [57846]

## 2010-10-29 NOTE — Progress Notes (Signed)
Summary: MED OTY NOT CORRECT PER PT  Phone Note Call from Patient Call back at Home Phone [phone]   Caller: Patient Call For: Judithann Sheen MD Summary of Call: pt [facility]ycodone 20mg  #90 should have been #120. Pt take med 4 times a day. Rx for oct is for #90 should be #120 please advise. Initial call taken by: Heron Sabins,  June 24, 2010 1:16 PM  Follow-up for Phone Call        will not change amount Dr Scotty Court gives her. She should have rx well into November still, can call back when med is due Follow-up by: Danise Edge MD,  June 24, 2010 3:47 PM  Additional Follow-up for Phone Call Additional follow up Details #1::        Patient informed and informed to contact [place] with further questions. Additional Follow-up by: Josph Macho RMA,  June 24, 2010 4:13 PM

## 2010-10-29 NOTE — Assessment & Plan Note (Signed)
Summary: refill meds/dm   Vital Signs:  Patient profile:   50 year old female Weight:      236 pounds BMI:     39.41 O2 Sat:      97 % Temp:     98.2 degrees F Pulse rate:   80 / minute BP sitting:   112 / 80  (left arm) Cuff size:   large  Vitals Entered By: Pura Spice, RN (Feb 19, 2010 4:14 PM) CC: wants med refills -oxycodone    History of Present Illness: This 50 year old white female who is obese and has had chronic back pain evaluation previously but no improvement but rather stable pain. She has had workup and evaluation past and has degenerative disease she is in today for refill of her medications. She has been instructed to lose weight but this has been the unknown satisfactory approach since she used gained 8 or 9 pounds since her last visit her and she continues to have some anxiety and episodes of tachycardia which has been relieved with Toprol. She's been evaluated by cardiologist no underlying disease although was  Allergies (verified): No Known Drug Allergies  Past History:  Past Medical History: ovarian cyts bilaterall 30   Review of Systems  The patient denies anorexia, fever, weight loss, weight gain, vision loss, decreased hearing, hoarseness, chest pain, syncope, dyspnea on exertion, peripheral edema, prolonged cough, headaches, hemoptysis, abdominal pain, melena, hematochezia, severe indigestion/heartburn, hematuria, incontinence, genital sores, muscle weakness, suspicious skin lesions, transient blindness, difficulty walking, depression, unusual weight change, abnormal bleeding, enlarged lymph nodes, angioedema, breast masses, and testicular masses.    Physical Exam  General:  Well-developed,well-nourished,in no acute distress; alert,appropriate and cooperative throughout examinationoverweight-appearing.   Lungs:  Normal respiratory effort, chest expands symmetrically. Lungs are clear to auscultation, no crackles or wheezes. Heart:  Normal rate and regular  rhythm. S1 and S2 normal without gallop, murmur, click, rub or other extra sounds. Abdomen:  Bowel sounds positive,abdomen soft and non-tender without masses, organomegaly or hernias noted. Msk:  No deformity or scoliosis noted of thoracic or lumbar spine.   Extremities:  No clubbing, cyanosis, edema, or deformity noted with normal full range of motion of all joints.     Impression & Recommendations:  Problem # 1:  MUSCLE SPASM, BACK (ICD-724.8) Assessment Improved  Her updated medication list for this problem includes:    Etodolac 400 Mg Tabs (Etodolac) .Marland Kitchen... 1 tab two times a day pc    Cyclobenzaprine Hcl 10 Mg Tabs (Cyclobenzaprine hcl) .Marland Kitchen... 1 by mouth three times a day for muscle spasm    Oxycodone Hcl 20 Mg Tabs (Oxycodone hcl) .Marland Kitchen... 1 q4h as needed pain, not over 4 per day,may 24    Oxycodone Hcl 20 Mg Tabs (Oxycodone hcl) .Marland Kitchen... 1 q4h as needed pain, not over per day, fill june 24    Oxycodone Hcl 20 Mg Tabs (Oxycodone hcl) .Marland Kitchen... 1 q4h as needed pain , not over 4 per day fill july 24  Problem # 2:  ESSENTIAL HYPERTENSION (ICD-401.9) Assessment: Improved  Her updated medication list for this problem includes:    Lisinopril 10 Mg Tabs (Lisinopril) .Marland Kitchen... 1 by mouth once daily    Triamterene-hctz 75-50 Mg Tabs (Triamterene-hctz) .Marland Kitchen... 1 tab as needed edema    Toprol Xl 25 Mg Xr24h-tab (Metoprolol succinate) .Marland Kitchen... 1 tab daily  Problem # 3:  EDEMA (ICD-782.3) Assessment: Improved  Her updated medication list for this problem includes:    Triamterene-hctz 75-50 Mg Tabs (Triamterene-hctz) .Marland KitchenMarland KitchenMarland KitchenMarland Kitchen  1 tab as needed edema  Problem # 4:  PALPITATIONS, OCCASIONAL (ICD-785.1) Assessment: Improved  Her updated medication list for this problem includes:    Toprol Xl 25 Mg Xr24h-tab (Metoprolol succinate) .Marland Kitchen... 1 tab daily  Problem # 5:  INSOMNIA (ICD-780.52) Assessment: Unchanged  Her updated medication list for this problem includes:    Zolpidem Tartrate 10 Mg Tabs (Zolpidem tartrate)  .Marland Kitchen... 1 by mouth q hs as needed sleep  Problem # 6:  EXOGENOUS OBESITY (ICD-278.00) Assessment: Deteriorated  Complete Medication List: 1)  Etodolac 400 Mg Tabs (Etodolac) .Marland Kitchen.. 1 tab two times a day pc 2)  Lisinopril 10 Mg Tabs (Lisinopril) .Marland Kitchen.. 1 by mouth once daily 3)  Alprazolam 1 Mg Tabs (Alprazolam) .Marland Kitchen.. 1 three times a day for stress 4)  Zolpidem Tartrate 10 Mg Tabs (Zolpidem tartrate) .Marland Kitchen.. 1 by mouth q hs as needed sleep 5)  Triamterene-hctz 75-50 Mg Tabs (Triamterene-hctz) .Marland Kitchen.. 1 tab as needed edema 6)  Toprol Xl 25 Mg Xr24h-tab (Metoprolol succinate) .Marland Kitchen.. 1 tab daily 7)  Cyclobenzaprine Hcl 10 Mg Tabs (Cyclobenzaprine hcl) .Marland Kitchen.. 1 by mouth three times a day for muscle spasm 8)  Oxycodone Hcl 20 Mg Tabs (Oxycodone hcl) .Marland Kitchen.. 1 q4h as needed pain, not over 4 per day,may 24 9)  Oxycodone Hcl 20 Mg Tabs (Oxycodone hcl) .Marland Kitchen.. 1 q4h as needed pain, not over per day, fill june 24 10)  Oxycodone Hcl 20 Mg Tabs (Oxycodone hcl) .Marland Kitchen.. 1 q4h as needed pain , not over 4 per day fill july 24  Patient Instructions: 1)  You need to lose weight. Consider a lower calorie diet and regular exercise.  2)  will refill your medications for chronic back pain\par 3)  Continue patient is prescribed Prescriptions: OXYCODONE HCL 20 MG TABS (OXYCODONE HCL) 1 q4h as needed pain , not over 4 per day fill july 24  #120 x 0   Entered and Authorized by:   Judithann Sheen MD   Signed by:   Judithann Sheen MD on 02/19/2010   Method used:   Print then Give to Patient   RxID:   [id number] OXYCODONE HCL 20 MG TABS (OXYCODONE HCL) 1 q4h as needed pain, not over per day, fill June 24  #120 x 0   Entered and Authorized by:   Judithann Sheen MD   Signed by:   Judithann Sheen MD on 02/19/2010   Method used:   Print then Give to Patient   RxID:   [id number] OXYCODONE HCL 20 MG TABS (OXYCODONE HCL) 1 q4h as needed pain, not over 4 per day,May 24  #120 x 0   Entered and Authorized by:   Judithann Sheen MD   Signed by:   Judithann Sheen MD on 02/19/2010   Method used:   Print then Give to Patient   RxID:   [id number]

## 2010-10-29 NOTE — Assessment & Plan Note (Signed)
Summary: MED CK (REFILLS) // RS   Vital Signs:  Patient profile:   50 year old female Weight:      230 pounds O2 Sat:      98 % Temp:     97.8 degrees F Pulse rate:   90 / minute BP sitting:   110 / 72  (left arm) Cuff size:   large  Vitals Entered By: Pura Spice, RN (November 22, 2009 1:26 PM) CC: refill meds    History of Present Illness: This 50 year old white obese female is in to have her medications refilled and discuss the progression of her medical problems. Her exogenous obesity persist however she has lost 4 pounds she continues to have pain in the low back he gallops as well as some knee pain. Etodolac does help her arthritic pain some but she continues to need her analgesics Patient was in tachycardia have been controlled with Toprol and her blood pressure is being controlled with these medications as well as Maxzide Anxiety and depression continues however the patient states she is doing very well in this regard Needs her medications refilled and plans to return for further examination when it is due  Allergies (verified): No Known Drug Allergies  Review of Systems  The patient denies anorexia, fever, weight loss, weight gain, vision loss, decreased hearing, hoarseness, chest pain, syncope, dyspnea on exertion, peripheral edema, prolonged cough, headaches, hemoptysis, abdominal pain, melena, hematochezia, severe indigestion/heartburn, hematuria, incontinence, genital sores, muscle weakness, suspicious skin lesions, transient blindness, difficulty walking, depression, unusual weight change, abnormal bleeding, enlarged lymph nodes, angioedema, breast masses, and testicular masses.    Physical Exam  General:  Well-developed,well-nourished,in no acute distress; alert,appropriate and cooperative throughout examinationoverweight-appearing.   Lungs:  Normal respiratory effort, chest expands symmetrically. Lungs are clear to auscultation, no crackles or wheezes. Heart:   Normal rate and regular rhythm. S1 and S2 normal without gallop, murmur, click, rub or other extra sounds. Abdomen:  Bowel sounds positive,abdomen soft and non-tender without masses, organomegaly or hernias noted.except OB Msk:  tenderness over both SI joints Extremities:  No clubbing, cyanosis, edema, or deformity noted with normal full range of motion of all joints.     Impression & Recommendations:  Problem # 1:  ESSENTIAL HYPERTENSION (ICD-401.9) Assessment Improved  Her updated medication list for this problem includes:    Lisinopril 10 Mg Tabs (Lisinopril) .Marland Kitchen... 1 by mouth once daily    Triamterene-hctz 75-50 Mg Tabs (Triamterene-hctz) .Marland Kitchen... As needed    Toprol Xl 25 Mg Xr24h-tab (Metoprolol succinate) .Marland Kitchen... 1 tab daily  Problem # 2:  ARTHRITIS, RIGHT KNEE (ICD-716.96) Assessment: Unchanged  Orders: Prescription Created Electronically [phone])  Problem # 3:  PALPITATIONS, OCCASIONAL (ICD-785.1) Assessment: Improved  Her updated medication list for this problem includes:    Toprol Xl 25 Mg Xr24h-tab (Metoprolol succinate) .Marland Kitchen... 1 tab daily  Problem # 4:  CHEST PAIN (ICD-786.50) Assessment: Improved  Problem # 5:  DEPRESSION (ICD-311) Assessment: Improved  Her updated medication list for this problem includes:    Alprazolam 1 Mg Tabs (Alprazolam) .Marland Kitchen... 1 three times a day for stress  Problem # 6:  INSOMNIA (ICD-780.52) Assessment: Improved  Her updated medication list for this problem includes:    Zolpidem Tartrate 10 Mg Tabs (Zolpidem tartrate) .Marland Kitchen... 1 by mouth q hs as needed sleep  Problem # 7:  EXOGENOUS OBESITY (ICD-278.00) Assessment: Unchanged  Problem # 8:  ANXIETY (ICD-300.00) Assessment: Improved  Her updated medication list for this problem includes:  Alprazolam 1 Mg Tabs (Alprazolam) .Marland Kitchen... 1 three times a day for stress  Problem # 9:  BACK PAIN, CHRONIC (ICD-724.5) Assessment: Unchanged  The following medications were removed from the medication  list:    Tramadol Hcl 50 Mg Tabs (Tramadol hcl) .Marland Kitchen... 1-2 q4h as needed pain Her updated medication list for this problem includes:    Etodolac 400 Mg Tabs (Etodolac) .Marland Kitchen... 1 tab two times a day pc    Cyclobenzaprine Hcl 10 Mg Tabs (Cyclobenzaprine hcl) .Marland Kitchen... 1 by mouth three times a day for muscle spasm    Oxycodone Hcl 20 Mg Tabs (Oxycodone hcl) .Marland Kitchen... 1 q4h as needed pain, not over 4 per day,fiapril 24  Complete Medication List: 1)  Etodolac 400 Mg Tabs (Etodolac) .Marland Kitchen.. 1 tab two times a day pc 2)  Lisinopril 10 Mg Tabs (Lisinopril) .Marland Kitchen.. 1 by mouth once daily 3)  Alprazolam 1 Mg Tabs (Alprazolam) .Marland Kitchen.. 1 three times a day for stress 4)  Zolpidem Tartrate 10 Mg Tabs (Zolpidem tartrate) .Marland Kitchen.. 1 by mouth q hs as needed sleep 5)  Triamterene-hctz 75-50 Mg Tabs (Triamterene-hctz) .... As needed 6)  Toprol Xl 25 Mg Xr24h-tab (Metoprolol succinate) .Marland Kitchen.. 1 tab daily 7)  Cyclobenzaprine Hcl 10 Mg Tabs (Cyclobenzaprine hcl) .Marland Kitchen.. 1 by mouth three times a day for muscle spasm 8)  Oxycodone Hcl 20 Mg Tabs (Oxycodone hcl) .Marland Kitchen.. 1 q4h as needed pain, not over 4 per day,fiapril 24  Patient Instructions: 1)  after discussing all of your problems will refill your medications which are noted in 2)  Return for your scheduled physical examination 3)  You need to lose weight. Consider a lower calorie diet and regular exercise. please attempt to this Prescriptions: OXYCODONE HCL 20 MG TABS (OXYCODONE HCL) 1 q4h as needed pain, not over 4 per day,fiApril 24  #120 x 0   Entered and Authorized by:   Judithann Sheen MD   Signed by:   Judithann Sheen MD on 11/22/2009   Method used:   Print then Give to Patient   RxID:   [id number] OXYCODONE HCL 20 MG TABS (OXYCODONE HCL) 1 q4h as needed pain, not over 4 per day,fillMar 245  #120 x 0   Entered and Authorized by:   Judithann Sheen MD   Signed by:   Judithann Sheen MD on 11/22/2009   Method used:   Print then Give to Patient   RxID:    [id number] OXYCODONE HCL 20 MG TABS (OXYCODONE HCL) 1 q4h as needed pain, not over 4 per day,fill Feb 24  #120 x 0   Entered and Authorized by:   Judithann Sheen MD   Signed by:   Judithann Sheen MD on 11/22/2009   Method used:   Print then Give to Patient   RxID:   [id number] ZOLPIDEM TARTRATE 10 MG TABS (ZOLPIDEM TARTRATE) 1 by mouth q hs as needed sleep  #30 x 5   Entered and Authorized by:   Judithann Sheen MD   Signed by:   Judithann Sheen MD on 11/22/2009   Method used:   Print then Give to Patient   RxID:   [id number] ALPRAZOLAM 1 MG TABS (ALPRAZOLAM) 1 three times a day for stress  #90 x 5   Entered and Authorized by:   Judithann Sheen MD   Signed by:   Judithann Sheen MD on 11/22/2009   Method used:  Print then Give to Patient   RxID:   [id number] LISINOPRIL 10 MG TABS (LISINOPRIL) 1 by mouth once daily  #30 x 11   Entered and Authorized by:   Judithann Sheen MD   Signed by:   Judithann Sheen MD on 11/22/2009   Method used:   Electronically to        [facility]* (retail)       [address] [place], [place]  [address]       Ph: [phone]       Fax: [phone]   RxID:   [id number] ETODOLAC 400 MG  TABS (ETODOLAC) 1 tab two times a day pc  #60 x 11   Entered and Authorized by:   Judithann Sheen MD   Signed by:   Judithann Sheen MD on 11/22/2009   Method used:   Electronically to        [facility]* (retail)       [address] [place], [place]  [address]       Ph: [phone]       Fax: [phone]   RxID:   [id number]

## 2010-10-29 NOTE — Progress Notes (Signed)
Summary: REQ FOR MEDICATION  Phone Note Call from Patient   Caller: Patient  [phone] Summary of Call: Pt called and adv that she is attempting to get a prescription refilled for med: Triamterene-hctz 75-50 Mg .Marland Kitchen... Pt adv that she takes the pill as needed ...... Pt adv that she is no longer using [facility] and would like the Rx to be sent to [facility] on [address].  Initial call taken by: Debbra Riding,  December 20, 2009 11:48 AM  Follow-up for Phone Call        [facility]

## 2010-10-29 NOTE — Assessment & Plan Note (Signed)
Summary: DISCUSS MEDS AND REFERRAL TO PAIN CLINIC//SLM   Vital Signs:  Patient profile:   50 year old female Height:      65 inches (165.10 cm) Weight:      236 pounds (107.27 kg) O2 Sat:      96 % on Room air Temp:     98.6 degrees F (37.00 degrees C) oral Pulse rate:   98 / minute BP sitting:   108 / 80  (left arm) Cuff size:   large  Vitals Entered By: Josph Macho RMA (July 01, 2010 12:36 PM)  O2 Flow:  Room air CC: Discuss meds and referral to pain clinic/ flu vaccinaton today/ CF Is Patient Diabetic? No   History of Present Illness: Patient in today for evaluation of chronic low back pain, does have pain from neck all the way down with to the sacrum at times. Also c/o pain in hips. Has been using Oxycodone 20mg  by mouth qid or three times a day for years. She feels that she does better with the QID dosing. Last November QID dosing she felt was helping. She reports at her last visit she was kept with the same sig of 1 q 4 hr as needed max of 4 once daily but only given 90 and she feels her pain is significantly worse on the 3 tabs daily. She reports initially she did not notice that there were less pills, so she ran out early and was in a great deal more pain than usual. She is requesting she return to the 4 a day. No recent injury, f, c, malaise, CP, palp, SOB. She notes a long history of GI difficulty with intermittent diarrhea and constipation. No bloody or tarry stool. She also notes a > 20 year history of urinary hesitancy but without any prolapse/dysuria/hematuria/urgency/frequency.   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 1 Tab Two Times A Day Pc 2)  Lisinopril 10 Mg Tabs (Lisinopril) .Marland Kitchen.. 1 By Mouth Once Daily 3)  Alprazolam 1 Mg Tabs (Alprazolam) .Marland Kitchen.. 1 Three Times A Day For Stress 4)  Zolpidem Tartrate 10 Mg Tabs (Zolpidem Tartrate) .Marland Kitchen.. 1 By Mouth Q Hs As Needed Sleep 5)  Triamterene-Hctz 75-50 Mg Tabs (Triamterene-Hctz) .Marland Kitchen.. 1 Tab As Needed Edema 6)  Toprol Xl 25 Mg Xr24h-Tab (Metoprolol Succinate) .Marland Kitchen.. 1 Tab Daily 7)  Cyclobenzaprine Hcl 10 Mg Tabs (Cyclobenzaprine Hcl) .Marland Kitchen.. 1 By Mouth Three Times A Day For Muscle Spasm 8)  Oxycodone Hcl 20 Mg Tabs (Oxycodone Hcl) .Marland Kitchen.. 1 Q4h As Needed Pain, Not Over 4 Per Day,aug 24 9)  Oxycodone Hcl 20 Mg Tabs (Oxycodone Hcl) .Marland Kitchen.. 1 Q4h As Needed Pain, Not Over Per Day, Fill Sept 24 10)  Oxycodone Hcl 20 Mg  Tabs (Oxycodone Hcl) .Marland Kitchen.. 1 Q4h As Needed Pain , Not Over 4 Per Day Fill Oct 24  Allergies (verified): No Known Drug Allergies  Past History:  Past medical history reviewed for relevance to current acute and chronic problems. Social history (including risk factors) reviewed for relevance to current acute and chronic problems.  Past Medical History: Reviewed history from 02/19/2010 and no changes required. 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  Review of Systems      See HPI       Flu Vaccine Consent Questions     Do you have a history of severe allergic reactions to this vaccine? no    Any prior history of allergic reactions to egg and/or gelatin? no    Do you have a sensitivity  to the preservative Thimersol? no    Do you have a past history of Guillan-Barre Syndrome? no    Do you currently have an acute febrile illness? no    Have you ever had a severe reaction to latex? no    Vaccine information given and explained to patient? yes    Are you currently pregnant? no    Lot Number:[id number]   Exp Date:03/29/2011   Site Given  Left Deltoid IM Josph Macho RMA  July 01, 2010 12:49 PM    Physical Exam  General:  Well-developed,well-nourished,in no acute distress; alert,appropriate and cooperative throughout examination. Obese Head:  Normocephalic and atraumatic without obvious abnormalities. No apparent alopecia or balding. Mouth:  Oral mucosa and oropharynx without lesions or exudates.  Teeth in good repair. Neck:  No deformities, masses, or tenderness noted. Lungs:  Normal respiratory effort, chest expands symmetrically. Lungs are clear to auscultation, no crackles or wheezes. Heart:  Normal rate and regular rhythm. S1 and S2 normal without gallop, murmur, click, rub or other extra sounds. Abdomen:  Bowel sounds positive,abdomen soft and non-tender without masses, organomegaly or hernias noted.   Impression & Recommendations:  Problem # 1:  BACK PAIN, CHRONIC (ICD-724.5)  Her updated medication list for this problem includes:    Etodolac 400 Mg Tabs (Etodolac) .Marland Kitchen... 1 tab two times a day pc    Cyclobenzaprine Hcl 10 Mg Tabs (Cyclobenzaprine hcl) .Marland Kitchen... 1 by mouth three times a day for muscle spasm    Oxycodone Hcl 20 Mg Tabs (Oxycodone hcl) .Marland Kitchen... 1 q4h as needed pain, not over 4 per day, sept 24, may these 30 to the september rx given to the patient by her pmd in august    Oxycodone Hcl 20 Mg Tabs (Oxycodone hcl) .Marland Kitchen... 1 q4h as needed pain, not over per day, fill sept 24    Oxycodone Hcl 20 Mg Tabs (Oxycodone hcl) .Marland Kitchen... 1 q4h as needed pain , not over 4 per day fill oct 24, may add these 30 tabs to the rx provided by her pmd in august for october that is  already on file Reviewed patient chart, did have 120 Oxycodone until last visit. Patient does not acknowledge that they ever discussed decreasing her tabs to just 3 a day. So will allow her to have the extra 30 tabs per month temporarily. She also agrees to referral to pain management for further evaluation. She is warned that ongoing narcotic use has significant risk associated with it and that pain management will help her to sort out ways to manage her pain while minimizing narcotic use. They will also be able to help her consider the diagnosis of Fibromyalgia  as a complicating factor to her pain  Orders: Pain Clinic Referral (Pain)  Problem # 2:  CONSTIPATION, INTERMITTENT (ICD-564.00) Add Align caps, 1 cap daily Benefiber 2 tsp by mouth bid Yogurt daily  If no BM at day2 take Senna S, at day 3 day MOM and a Dulcolax supp. Increase fluids and roughage in diet  Problem # 3:  MORBID OBESITY (ICD-278.01) Decrease by mouth intake, increase roughage and lean proteins, increase exercise  Problem # 4:  ESSENTIAL HYPERTENSION (ICD-401.9)  Her updated medication list for this problem includes:    Lisinopril 10 Mg Tabs (Lisinopril) .Marland Kitchen... 1 by mouth once daily    Triamterene-hctz 75-50 Mg Tabs (Triamterene-hctz) .Marland Kitchen... 1 tab as needed edema    Toprol Xl 25 Mg Xr24h-tab (Metoprolol succinate) .Marland Kitchen... 1 tab daily  Well controlled at current visit, no changes to therapy  Complete Medication List: 1)  Etodolac 400 Mg Tabs (Etodolac) .Marland Kitchen.. 1 tab two times a day pc 2)  Lisinopril 10 Mg Tabs (Lisinopril) .Marland Kitchen.. 1 by mouth once daily 3)  Alprazolam 1 Mg Tabs (Alprazolam) .Marland Kitchen.. 1 three times a day for stress 4)  Zolpidem Tartrate 10 Mg Tabs (Zolpidem tartrate) .Marland Kitchen.. 1 by mouth q hs as needed sleep 5)  Triamterene-hctz 75-50 Mg Tabs (Triamterene-hctz) .Marland Kitchen.. 1 tab as needed edema 6)  Toprol Xl 25 Mg Xr24h-tab (Metoprolol succinate) .Marland Kitchen.. 1 tab daily 7)  Cyclobenzaprine Hcl 10 Mg Tabs (Cyclobenzaprine hcl) .Marland Kitchen.. 1  by mouth three times a day for muscle spasm 8)  Oxycodone Hcl 20 Mg Tabs (Oxycodone hcl) .Marland Kitchen.. 1 q4h as needed pain, not over 4 per day, sept 24, may these 30 to the september rx given to the patient by her pmd in august 9)  Oxycodone Hcl 20 Mg Tabs (Oxycodone hcl) .Marland Kitchen.. 1 q4h as needed pain, not over per day, fill sept 24 10)  Oxycodone Hcl 20 Mg Tabs (Oxycodone hcl) .Marland Kitchen.. 1 q4h as needed pain , not over 4 per day fill oct 24, may add these 30 tabs to the rx provided by her pmd in august for october that is already on file  Other Orders: Admin 1st Vaccine (54098) Flu Vaccine 39yrs + [phone])  Patient Instructions: 1)  Please schedule a follow-up appointment as needed if symptoms worsen or do not improve 2)  Most patients (90%) with low back pain will improve with time ( 2-6 weeks). Keep active but avoid activities that are painful. Apply moist heat and/or ice to lower back several times a day.  3)  Stay as active as possible, attempt gentle daily stretching 4)  For bowels add Align 1 cap daily, yogurt 1 daily and Benefiber 2 tsp by mouth two times a day in food or liquid. 5)  If no BM at day 2 take Senna S if no BM at day 3 take Milk of Mag, 2 tbls by mouth and a Dulcolax supporitory pr repeat in 6 hours if no repsonse Prescriptions: OXYCODONE HCL 20 MG TABS (OXYCODONE HCL) 1 q4h as needed pain, not over 4 per day, Sept 24, may these 30 to the September rx given to the patient by her PMD in August  #30 x 0   Entered and Authorized by:   Danise Edge MD   Signed by:   Danise Edge MD on 07/01/2010   Method used:   Print then Give to Patient   RxID:   [id number] OXYCODONE HCL 20 MG TABS (OXYCODONE HCL) 1 q4h as needed pain , not over  4 per day fill Oct 24, may add these 30 tabs to the rx provided by her PMD in August for October that is already on file  #30 x 0   Entered and Authorized by:   Danise Edge MD   Signed by:   Danise Edge MD on 07/01/2010   Method used:   Print then Give to  Patient   RxID:   [id number]

## 2010-10-29 NOTE — Letter (Signed)
Summary: Results Follow-up Letter  [facility]  [address] [place], [place] [address]   Phone: [phone]  Fax: [phone]    05/14/2010  [address] [place], [place]  [address]  Dear [name] [name],   The following are the results of your recent test(s):  Test     Result     Pap Smear    Normal___ yes ____    _  Sincerely,    Dr Nell Range  [place] [facility]

## 2010-10-29 NOTE — Assessment & Plan Note (Signed)
Summary: cpx/pap/cjr   Vital Signs:  Patient profile:   50 year old female Weight:      236 pounds O2 Sat:      95 % Temp:     97.7 degrees F Pulse rate:   92 / minute Pulse rhythm:   regular BP sitting:   124 / 88  (left arm) Cuff size:   large  Vitals Entered By: Pura Spice, RN (April 30, 2010 2:07 PM) CC: cpx with pap  Is Patient Diabetic? No   History of Present Illness: This 50 year old white female, morbidly obese is in for complete physical examination she relates she is doing well except for difficulty losing weight. She also relates she had her last menstrual period in over one year or an would like to discuss whether she's in menopause or not but does not have any symptom Continues to have some peripheral edema but controlled with diuretic she has no chest pain or palpitation denies any for some time it has continued to take her Toprol Chronic back pain and arthritis and is controlled with etodolac as well as having to take oxycodone  Allergies: No Known Drug Allergies  Past History:  Past Medical History: Last updated: 02/19/2010 ovarian cyts bilaterall 30   Review of Systems      See HPI  The patient denies anorexia, fever, weight loss, weight gain, vision loss, decreased hearing, hoarseness, chest pain, syncope, dyspnea on exertion, peripheral edema, prolonged cough, headaches, hemoptysis, abdominal pain, melena, hematochezia, severe indigestion/heartburn, hematuria, incontinence, genital sores, muscle weakness, suspicious skin lesions, transient blindness, difficulty walking, depression, unusual weight change, abnormal bleeding, enlarged lymph nodes, angioedema, breast masses, and testicular masses.    Physical Exam  General:  Well-developed,well-nourished,in no acute distress; alert,appropriate and cooperative throughout examinationoverweight-appearing.   Head:  Normocephalic and atraumatic without obvious abnormalities. No apparent alopecia or  balding. Eyes:  No corneal or conjunctival inflammation noted. EOMI. Perrla. Funduscopic exam benign, without hemorrhages, exudates or papilledema. Vision grossly normal. Ears:  External ear exam shows no significant lesions or deformities.  Otoscopic examination reveals clear canals, tympanic membranes are intact bilaterally without bulging, retraction, inflammation or discharge. Hearing is grossly normal bilaterally. Nose:  External nasal examination shows no deformity or inflammation. Nasal mucosa are pink and moist without lesions or exudates. Mouth:  Oral mucosa and oropharynx without lesions or exudates.  Teeth in good repair. Neck:  No deformities, masses, or tenderness noted. Chest Wall:  No deformities, masses, or tenderness noted. Breasts:  No mass, nodules, thickening, tenderness, bulging, retraction, inflamation, nipple discharge or skin changes noted.   Lungs:  Normal respiratory effort, chest expands symmetrically. Lungs are clear to auscultation, no crackles or wheezes. Heart:  Normal rate and regular rhythm. S1 and S2 normal without gallop, murmur, click, rub or other extra sounds. Abdomen:  obese abdomen, liver spleen kidneys nonpalpable no masses normal bowel phlegm no tenderness Rectal:  No external abnormalities noted. Normal sphincter tone. No rectal masses or tenderness. Genitalia:  Normal introitus for age, no external lesions, no vaginal discharge, mucosa pink and moist, no vaginal or cervical lesions, no vaginal atrophy, no friaility or hemorrhage, normal uterus size and position, no adnexal masses or tenderness Msk:  No deformity or scoliosis noted of thoracic or lumbar spine.   Extremities:  left pretibial edema and right pretibial edema.  1+ Neurologic:  No cranial nerve deficits noted. Station and gait are normal. Plantar reflexes are down-going bilaterally. DTRs are symmetrical throughout. Sensory, motor and coordinative  functions appear intact. Skin:  Intact without  suspicious lesions or rashes Cervical Nodes:  No lymphadenopathy noted Axillary Nodes:  No palpable lymphadenopathy Inguinal Nodes:  No significant adenopathy Psych:  Cognition and judgment appear intact. Alert and cooperative with normal attention span and concentration. No apparent delusions, illusions, hallucinations   Impression & Recommendations:  Problem # 1:  PHYSICAL EXAMINATION (ICD-V70.0) Assessment Unchanged  Orders: UA Dipstick w/o Micro (automated)  (81003)  Problem # 2:  ESSENTIAL HYPERTENSION (ICD-401.9) Assessment: Improved  Her updated medication list for this problem includes:    Lisinopril 10 Mg Tabs (Lisinopril) .Marland Kitchen... 1 by mouth once daily    Triamterene-hctz 75-50 Mg Tabs (Triamterene-hctz) .Marland Kitchen... 1 tab as needed edema    Toprol Xl 25 Mg Xr24h-tab (Metoprolol succinate) .Marland Kitchen... 1 tab daily  Problem # 3:  ARTHRITIS, RIGHT KNEE (ICD-716.96) Assessment: Improved  Problem # 4:  ANXIETY (ICD-300.00) Assessment: Unchanged  Her updated medication list for this problem includes:    Alprazolam 1 Mg Tabs (Alprazolam) .Marland Kitchen... 1 three times a day for stress  Problem # 5:  BACK PAIN, CHRONIC (ICD-724.5) Assessment: Unchanged  Her updated medication list for this problem includes:    Etodolac 400 Mg Tabs (Etodolac) .Marland Kitchen... 1 tab two times a day pc    Cyclobenzaprine Hcl 10 Mg Tabs (Cyclobenzaprine hcl) .Marland Kitchen... 1 by mouth three times a day for muscle spasm    Oxycodone Hcl 20 Mg Tabs (Oxycodone hcl) .Marland Kitchen... 1 q4h as needed pain, not over 4 per day,aug 24    Oxycodone Hcl 20 Mg Tabs (Oxycodone hcl) .Marland Kitchen... 1 q4h as needed pain, not over per day, fill sept 24    Oxycodone Hcl 20 Mg Tabs (Oxycodone hcl) .Marland Kitchen... 1 q4h as needed pain , not over 4 per day fill oct 24  Problem # 6:  MENOPAUSE, EARLY (ICD-627.2) Assessment: New  Orders: Venipuncture (40347) Specimen Handling (42595) TLB-FSH (Follicle Stimulating Hormone) (83001-FSH)  Problem # 7:  EDEMA (ICD-782.3) Assessment:  Improved  Her updated medication list for this problem includes:    Triamterene-hctz 75-50 Mg Tabs (Triamterene-hctz) .Marland Kitchen... 1 tab as needed edema  Problem # 8:  INSOMNIA (ICD-780.52) Assessment: Unchanged  Her updated medication list for this problem includes:    Zolpidem Tartrate 10 Mg Tabs (Zolpidem tartrate) .Marland Kitchen... 1 by mouth q hs as needed sleep  Complete Medication List: 1)  Etodolac 400 Mg Tabs (Etodolac) .Marland Kitchen.. 1 tab two times a day pc 2)  Lisinopril 10 Mg Tabs (Lisinopril) .Marland Kitchen.. 1 by mouth once daily 3)  Alprazolam 1 Mg Tabs (Alprazolam) .Marland Kitchen.. 1 three times a day for stress 4)  Zolpidem Tartrate 10 Mg Tabs (Zolpidem tartrate) .Marland Kitchen.. 1 by mouth q hs as needed sleep 5)  Triamterene-hctz 75-50 Mg Tabs (Triamterene-hctz) .Marland Kitchen.. 1 tab as needed edema 6)  Toprol Xl 25 Mg Xr24h-tab (Metoprolol succinate) .Marland Kitchen.. 1 tab daily 7)  Cyclobenzaprine Hcl 10 Mg Tabs (Cyclobenzaprine hcl) .Marland Kitchen.. 1 by mouth three times a day for muscle spasm 8)  Oxycodone Hcl 20 Mg Tabs (Oxycodone hcl) .Marland Kitchen.. 1 q4h as needed pain, not over 4 per day,aug 24 9)  Oxycodone Hcl 20 Mg Tabs (Oxycodone hcl) .Marland Kitchen.. 1 q4h as needed pain, not over per day, fill sept 24 10)  Oxycodone Hcl 20 Mg Tabs (Oxycodone hcl) .Marland Kitchen.. 1 q4h as needed pain , not over 4 per day fill oct 24  Other Orders: TLB-A1C / Hgb A1C (Glycohemoglobin) (83036-A1C)  Patient Instructions: 1)  recommend you try much harder regarding weight loss  program as well as increasing your physical activity to help with what 2)  Continue medications as prescribed 3)  We'll call lab result Prescriptions: OXYCODONE HCL 20 MG TABS (OXYCODONE HCL) 1 q4h as needed pain , not over 4 per day fill Oct 24  #90 x 0   Entered and Authorized by:   Judithann Sheen MD   Signed by:   Judithann Sheen MD on 04/30/2010   Method used:   Print then Give to Patient   RxID:   [id number] OXYCODONE HCL 20 MG TABS (OXYCODONE HCL) 1 q4h as needed pain, not over per day, fill Sept 24  #90  x 0   Entered and Authorized by:   Judithann Sheen MD   Signed by:   Judithann Sheen MD on 04/30/2010   Method used:   Print then Give to Patient   RxID:   [id number] OXYCODONE HCL 20 MG TABS (OXYCODONE HCL) 1 q4h as needed pain, not over 4 per day,Aug 24  #90 x 0   Entered and Authorized by:   Judithann Sheen MD   Signed by:   Judithann Sheen MD on 04/30/2010   Method used:   Print then Give to Patient   RxID:   [id number] ZOLPIDEM TARTRATE 10 MG TABS (ZOLPIDEM TARTRATE) 1 by mouth q hs as needed sleep  #30 x 5   Entered and Authorized by:   Judithann Sheen MD   Signed by:   Judithann Sheen MD on 04/30/2010   Method used:   Print then Give to Patient   RxID:   [id number] ALPRAZOLAM 1 MG TABS (ALPRAZOLAM) 1 three times a day for stress  #90 x 5   Entered and Authorized by:   Judithann Sheen MD   Signed by:   Judithann Sheen MD on 04/30/2010   Method used:   Print then Give to Patient   RxID:   [id number] CYCLOBENZAPRINE HCL 10 MG TABS (CYCLOBENZAPRINE HCL) 1 by mouth three times a day for muscle spasm  #90 x 11   Entered and Authorized by:   Judithann Sheen MD   Signed by:   Judithann Sheen MD on 04/30/2010   Method used:   Electronically to        [facility]* (retail)       [address] [place], [place]  [address]       Ph: [phone]       Fax: [phone]   RxID:   [id number] TOPROL XL 25 MG XR24H-TAB (METOPROLOL SUCCINATE) 1 tab daily  #30 x 11   Entered and Authorized by:   Judithann Sheen MD   Signed by:   Judithann Sheen MD on 04/30/2010   Method used:   Electronically to        [facility]* (retail)       [address] [place], [place]  [address]       Ph: [phone]       Fax: [phone]   RxID:   [id number]   Laboratory Results   Urine Tests  Date/Time Recieved: April 30, 2010 3:35  PM  Date/Time Reported: April 30, 2010 3:35 PM  Routine Urinalysis   Color: yellow Appearance: Clear Glucose: negative   (Normal Range: Negative) Bilirubin: negative   (Normal Range: Negative) Ketone: negative   (Normal Range: Negative) Spec. Gravity: 1.010   (Normal Range: 1.003-1.035) Blood: negative   (Normal Range: Negative) pH: 5.0   (Normal Range: 5.0-8.0) Protein: negative   (Normal Range: Negative) Urobilinogen: 0.2   (Normal Range: 0-1) Nitrite: negative   (Normal Range: Negative) Leukocyte Esterace: negative   (Normal Range: Negative)    Comments: Wynona Canes, CMA  April 30, 2010 3:35 PM

## 2010-10-31 NOTE — Progress Notes (Signed)
Summary: Pt req to get 16 tabs of Oxcodone filled today. Pt in pain  Phone Note Call from Patient Call back at Home Phone [phone]   Caller: Patient Summary of Call: Pt took script for Oxcodone to pharmacy and it says not to fill until 11/20/10. Pt says that she needs this filled today for 16 tabs. Pls call.  Initial call taken by: Lucy Antigua,  October 17, 2010 1:17 PM  Follow-up for Phone Call        pt called again. needs meds, asap. thanks. Follow-up by: Warnell Forester,  October 17, 2010 1:37 PM  Additional Follow-up for Phone Call Additional follow up Details #1::        ok to fill. Additional Follow-up by: Edwyna Perfect MD,  October 17, 2010 2:45 PM    Additional Follow-up for Phone Call Additional follow up Details #2::    Controlled Substance Contact given to patient to sign Follow-up by: Trixie Dredge,  October 17, 2010 4:27 PM  Additional Follow-up for Phone Call Additional follow up Details #3:: Details for Additional Follow-up Action Taken: corrected prescription printed. Additional Follow-up by: Edwyna Perfect MD,  October 17, 2010 4:32 PM  New/Updated Medications: OXYCODONE HCL 20 MG TABS (OXYCODONE HCL) one by mouth q6 hours as needed pain. max 4  a day. ok to fill now Prescriptions: OXYCODONE HCL 20 MG TABS (OXYCODONE HCL) one by mouth q6 hours as needed pain. max 4  a day. ok to fill now  #30 x 0   Entered and Authorized by:   Edwyna Perfect MD   Signed by:   Edwyna Perfect MD on 10/17/2010   Method used:   Print then Give to Patient   RxID:   [id number]

## 2010-10-31 NOTE — Assessment & Plan Note (Signed)
Summary: fup er//ccm   Vital Signs:  Patient profile:   50 year old female Weight:      227 pounds Pulse rate:   112 / minute Pulse rhythm:   regular BP sitting:   120 / 80  (left arm) Cuff size:   large  Vitals Entered By: Kyung Rudd, CMA (October 17, 2010 10:01 AM) CC: ER f/u from Monday   CC:  ER f/u from Monday.  History of Present Illness: Patient presents to clinic as a workin for evaluation of abd pain.  States presented to ED in [place] 1/16 with left cp and abd bloating. Underwent CK/EKG/CXR/CBC/chem7 neg. Reports neg nuclear stress  ~2yago. Sees cardiology and plans to f/u in near future. No further cp. Does c/o abd bloating/pain/tenderness with nausea without emesis. +intermittent constipation and does take long term narcotics. Last BM several days ago. States lost  ~60 tablets of oxycodone and has noted subsequent increase in pain.  Preventive Screening-Counseling & Management  Alcohol-Tobacco     Smoking Status: never  Current Medications (verified): 1)  Etodolac 400 Mg  Tabs (Etodolac) .Marland Kitchen.. 1 Tab Two Times A Day Pc 2)  Lisinopril 10 Mg Tabs (Lisinopril) .Marland Kitchen.. 1 By Mouth Once Daily 3)  Alprazolam 1 Mg Tabs (Alprazolam) .Marland Kitchen.. 1 Three Times A Day For Stress 4)  Zolpidem Tartrate 10 Mg Tabs (Zolpidem Tartrate) .Marland Kitchen.. 1 By Mouth Q Hs As Needed Sleep 5)  Triamterene-Hctz 75-50 Mg Tabs (Triamterene-Hctz) .Marland Kitchen.. 1 Tab As Needed Edema 6)  Toprol Xl 25 Mg Xr24h-Tab (Metoprolol Succinate) .Marland Kitchen.. 1 Tab Daily 7)  Cyclobenzaprine Hcl 10 Mg Tabs (Cyclobenzaprine Hcl) .Marland Kitchen.. 1 By Mouth Qid For Mucle Spasm and Pain, Fill On Schedule 8)  Oxycodone Hcl 20 Mg Tabs (Oxycodone Hcl) .Marland Kitchen.. 1 Q4h As Needed Pain , Not Over 4 Per Day Fill 09/17/10 9)  Cymbalta 30 Mg Cpep (Duloxetine Hcl) .Marland Kitchen.. 1 Cap By Mouth Daily X 7 Days Then Increase To 60mg  Daily 10)  Cymbalta 60 Mg Cpep (Duloxetine Hcl) .Marland Kitchen.. 1 Cap By Mouth Daily Aftewr A Week of 30mg  Tabs 11)  Oxycodone Hcl 20 Mg Tabs (Oxycodone Hcl) .Marland Kitchen.. 1q4-6 H As  Needed Pain, Not Over 4 Per Day, Fill On 10/20/10 12)  Oxycodone Hcl 20 Mg Tabs (Oxycodone Hcl) .Marland Kitchen.. 1 Q4-6 H As Needed Pain, Not Over 4 Per Day, Fill Feb 22,2012  Allergies (verified): No Known Drug Allergies  Past History:  Past medical, surgical, family and social histories (including risk factors) reviewed for relevance to current acute and chronic problems.  Past Medical History: Reviewed history from 09/17/2010 and no changes required. ovarian cyts bilaterall 30  chronic back pain Morbid obesity Hypertension Chronic anxiety depression Chronic insomnia Suspected fibromyalgia  Family History: Reviewed history and no changes required.  Social History: Reviewed history and no changes required. Never Smoked Smoking Status:  never  Review of Systems      See HPI General:  Denies chills, fever, and sweats. CV:  Complains of chest pain or discomfort; denies fainting, lightheadness, palpitations, and shortness of breath with exertion. Resp:  Complains of chest discomfort; denies cough and shortness of breath. GI:  Complains of abdominal pain, constipation, and nausea; denies bloody stools, dark tarry stools, loss of appetite, vomiting, vomiting blood, and yellowish skin color. MS:  Complains of joint pain, low back pain, and muscle aches.  Physical Exam  General:  Well-developed,well-nourished,in no acute distress; alert,appropriate and cooperative throughout examination Head:  Normocephalic and atraumatic without obvious abnormalities. No apparent alopecia or  balding. Eyes:  pupils equal, pupils round, corneas and lenses clear, and no injection.   Ears:  no external deformities.   Nose:  no external deformity.   Neck:  No deformities, masses, or tenderness noted. Lungs:  Normal respiratory effort, chest expands symmetrically. Lungs are clear to auscultation, no crackles or wheezes. Heart:  Normal rate and regular rhythm. S1 and S2 normal without gallop, murmur, click, rub or  other extra sounds. Abdomen:  soft, normal bowel sounds, no distention, no masses, no guarding, no rigidity, no rebound tenderness, no abdominal hernia, no hepatomegaly, and no splenomegaly.  +tenderness upper quandrants Skin:  turgor normal, color normal, and no rashes.   Psych:  Oriented X3, good eye contact, not anxious appearing, and not depressed appearing.     Impression & Recommendations:  Problem # 1:  ABDOMINAL PAIN OTHER SPECIFIED SITE (ICD-789.09) Assessment New Obtain amylase/lipase, lft. Obtain abd xray. Begin fleets enema/mag citrate today. Followup if no improvement or worsening.   Orders: Venipuncture (04540) Specimen Handling (98119) T-Abdomen 2-view (74020TC) TLB-Hepatic/Liver Function Pnl (80076-HEPATIC) TLB-Amylase (82150-AMYL) TLB-Lipase (83690-LIPASE)  Problem # 2:  CHEST PAIN (ICD-786.50) Assessment: Unchanged Unremarkable ED w/u. F/u with cardiology as outpt.  Problem # 3:  BACK PAIN, CHRONIC (ICD-724.5) Assessment: Unchanged [place] narcotic database reviewed. Agreed to single prescription for #30 oxycodone. All further prescriptions to be provided by pmd. Cautioned JY:[id number] for sedation, constipation, addiction and/or tolerance and states understanding. Recommended consideration of pain clinic as previously discussed.  Complete Medication List: 1)  Etodolac 400 Mg Tabs (Etodolac) .Marland Kitchen.. 1 tab two times a day pc 2)  Lisinopril 10 Mg Tabs (Lisinopril) .Marland Kitchen.. 1 by mouth once daily 3)  Alprazolam 1 Mg Tabs (Alprazolam) .Marland Kitchen.. 1 three times a day for stress 4)  Zolpidem Tartrate 10 Mg Tabs (Zolpidem tartrate) .Marland Kitchen.. 1 by mouth q hs as needed sleep 5)  Triamterene-hctz 75-50 Mg Tabs (Triamterene-hctz) .Marland Kitchen.. 1 tab as needed edema 6)  Toprol Xl 25 Mg Xr24h-tab (Metoprolol succinate) .Marland Kitchen.. 1 tab daily 7)  Cyclobenzaprine Hcl 10 Mg Tabs (Cyclobenzaprine hcl) .Marland Kitchen.. 1 by mouth qid for mucle spasm and pain, fill on schedule 8)  Oxycodone Hcl 20 Mg Tabs (Oxycodone hcl) .Marland Kitchen.. 1 q4h  as needed pain , not over 4 per day fill 09/17/10 9)  Cymbalta 30 Mg Cpep (Duloxetine hcl) .Marland Kitchen.. 1 cap by mouth daily x 7 days then increase to 60mg  daily 10)  Cymbalta 60 Mg Cpep (Duloxetine hcl) .Marland Kitchen.. 1 cap by mouth daily aftewr a week of 30mg  tabs 11)  Oxycodone Hcl 20 Mg Tabs (Oxycodone hcl) .Marland Kitchen.. 1q4-6 h as needed pain, not over 4 per day, fill on 10/20/10 12)  Oxycodone Hcl 20 Mg Tabs (Oxycodone hcl) .Marland Kitchen.. 1 q4-6 h as needed pain, not over 4 per day, fill feb 22,2012 Prescriptions: OXYCODONE HCL 20 MG TABS (OXYCODONE HCL) 1 q4-6 h as needed pain, not over 4 per day, fill Feb 22,2012  #30 x 0   Entered and Authorized by:   Edwyna Perfect MD   Signed by:   Edwyna Perfect MD on 10/17/2010   Method used:   Print then Give to Patient   RxID:   [id number]    Orders Added: 1)  Venipuncture [13244] 2)  Specimen Handling [99000] 3)  T-Abdomen 2-view [74020TC] 4)  TLB-Hepatic/Liver Function Pnl [80076-HEPATIC] 5)  TLB-Amylase [82150-AMYL] 6)  TLB-Lipase [83690-LIPASE] 7)  Est. Patient Level IV [01027]

## 2010-10-31 NOTE — Progress Notes (Signed)
Summary: Pt lost majority of oxycodone down sink and vent in floor  Phone Note Call from Patient Call back at Home Phone [phone]     Dosage confirmed as above?Dosage Confirmed Summary of Call: Pt called and said that she lost the majority of Oxycodone. Pt says that she only has 33 pills left. Pt says that her room mate startled her and she dropped them in bathroom and they fell down the sink and radiator. Pt req refill. Initial call taken by: Lucy Antigua,  October 02, 2010 3:53 PM  Follow-up for Phone Call        Pt states she will be out of meds by tomorrow. Follow-up by: Trixie Dredge,  October 07, 2010 8:34 AM  Additional Follow-up for Phone Call Additional follow up Details #1::        pt cb Additional Follow-up by: Heron Sabins,  October 08, 2010 11:56 AM    Additional Follow-up for Phone Call Additional follow up Details #2::    call patient not recorder left message to call us at Wednesday I may not be here so I'm leaving a message with daily tell the patient I will not refill the oxycodone at this time I will talk to her about that Follow-up by: Judithann Sheen MD,  October 08, 2010 6:45 PM

## 2010-10-31 NOTE — Letter (Signed)
Summary: CONTROLLED SUBSTANCES CONTRACT  CONTROLLED SUBSTANCES CONTRACT   Imported By: Georgian Co 10/17/2010 16:44:51  _____________________________________________________________________  External Attachment:    Type:   Image     Comment:   External Document

## 2010-10-31 NOTE — Progress Notes (Signed)
  Phone Note Call from Patient   Caller: Patient Call For: Judithann Sheen MD Summary of Call: Wants Dr. Scotty Court to know she went to the ER on 68 with chest pain........ER did not find anything, and discharged.  Had no BM in days, and went to another Dr. in [facility]........did nothing but gave her a 365.00 med, but no diagnosis.  Today she is still in pain, bought OTC indigestion meds that is not helping.  Does not want to do now.  What should she eat now?  No BM since Saturday.  Bland diet until tomorrow when she sees Dr. Rodena Medin. Initial call taken by: [facility] CMA AAMA,  October 16, 2010 3:10 PM  Follow-up for Phone Call        Dr Scotty Court aware Follow-up by: Alfred Levins, CMA,  October 16, 2010 4:32 PM

## 2010-10-31 NOTE — Assessment & Plan Note (Signed)
Summary: back problems//renew medicine//lch---PT [facility] // RS   Vital Signs:  Patient profile:   50 year old female Weight:      242 pounds Temp:     98.5 degrees F oral Pulse rate:   80 / minute Pulse rhythm:   regular BP sitting:   122 / 74  (left arm) Cuff size:   large  Vitals Entered By: Alfred Levins, CMA (September 17, 2010 2:44 PM) CC: renew meds   History of Present Illness: This 50 year old morbidly obese white female who is chronic back pain and also complained of pain in her fingers in her neck. Also complained of some muscle aches and pains episodically. She denies concerned somewhat that maybe she has mild fibromyalgia . She continues to have back pain Morbid obesity, excising the obesity and in fact since her last visit she is gained from 2:30 to 242 Blood pressure control and is normal 122/74 A shunt is in to have her medications renewed   Current Medications (verified): 1)  Etodolac 400 Mg  Tabs (Etodolac) .Marland Kitchen.. 1 Tab Two Times A Day Pc 2)  Lisinopril 10 Mg Tabs (Lisinopril) .Marland Kitchen.. 1 By Mouth Once Daily 3)  Alprazolam 1 Mg Tabs (Alprazolam) .Marland Kitchen.. 1 Three Times A Day For Stress 4)  Zolpidem Tartrate 10 Mg Tabs (Zolpidem Tartrate) .Marland Kitchen.. 1 By Mouth Q Hs As Needed Sleep 5)  Triamterene-Hctz 75-50 Mg Tabs (Triamterene-Hctz) .Marland Kitchen.. 1 Tab As Needed Edema 6)  Toprol Xl 25 Mg Xr24h-Tab (Metoprolol Succinate) .Marland Kitchen.. 1 Tab Daily 7)  Cyclobenzaprine Hcl 10 Mg Tabs (Cyclobenzaprine Hcl) .Marland Kitchen.. 1 By Mouth Three Times A Day For Muscle Spasm 8)  Oxycodone Hcl 20 Mg Tabs (Oxycodone Hcl) .Marland Kitchen.. 1 Q4h As Needed Pain , Not Over 4 Per 24 Hour Period, To Fill On 08/20/2010 9)  Cymbalta 30 Mg Cpep (Duloxetine Hcl) .Marland Kitchen.. 1 Cap By Mouth Daily X 7 Days Then Increase To 60mg  Daily 10)  Cymbalta 60 Mg Cpep (Duloxetine Hcl) .Marland Kitchen.. 1 Cap By Mouth Daily Aftewr A Week of 30mg  Tabs  Allergies (verified): No Known Drug Allergies  Past History:  Past Medical History: ovarian cyts bilaterall 30  chronic back  pain Morbid obesity Hypertension Chronic anxiety depression Chronic insomnia Suspected fibromyalgia  Review of Systems      See HPI  Physical Exam  General:  alert, well-developed, well-nourished, well-hydrated, and overweight-appearing.   Head:  Normocephalic and atraumatic without obvious abnormalities. No apparent alopecia or balding. Eyes:  No corneal or conjunctival inflammation noted. EOMI. Perrla. Funduscopic exam benign, without hemorrhages, exudates or papilledema. Vision grossly normal. Ears:  External ear exam shows no significant lesions or deformities.  Otoscopic examination reveals clear canals, tympanic membranes are intact bilaterally without bulging, retraction, inflammation or discharge. Hearing is grossly normal bilaterally. Nose:  External nasal examination shows no deformity or inflammation. Nasal mucosa are pink and moist without lesions or exudates. Mouth:  Oral mucosa and oropharynx without lesions or exudates.  Teeth in good repair. Neck:   Cervical spine Chest Wall:  No deformities, masses, or tenderness noted. Breasts:  not examined Lungs:  Normal respiratory effort, chest expands symmetrically. Lungs are clear to auscultation, no crackles or wheezes. Heart:  Normal rate and regular rhythm. S1 and S2 normal without gallop, murmur, click, rub or other extra sounds. Abdomen:  a large obese abdomen, liver spleen tenderness or mild palpable Rectal:  not examined Msk:  tenderness cervical as well as tenderness of the PIP joint both plan, tenderness over Pulses:  R  and L carotid,radial,femoral,dorsalis pedis and posterior tibial pulses are full and equal bilaterally Extremities:  left pretibial edema and right pretibial edema.  , trace of edema   Problems:  Medical Problems Added: 1)  Dx of Syncope  (ICD-780.2)  Impression & Recommendations:  Problem # 1:  MORBID OBESITY (ICD-278.01) Assessment Unchanged  Problem # 2:  CONSTIPATION, INTERMITTENT  (ICD-564.00) Assessment: Unchanged  Problem # 3:  HYPERGLYCEMIA (ICD-790.29) Assessment: Unchanged 2 recheck fasting blood sugar  Problem # 4:  ESSENTIAL HYPERTENSION (ICD-401.9) Assessment: Improved  Her updated medication list for this problem includes:    Lisinopril 10 Mg Tabs (Lisinopril) .Marland Kitchen... 1 by mouth once daily    Triamterene-hctz 75-50 Mg Tabs (Triamterene-hctz) .Marland Kitchen... 1 tab as needed edema    Toprol Xl 25 Mg Xr24h-tab (Metoprolol succinate) .Marland Kitchen... 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 1 tab as needed edema  Problem # 7:  PALPITATIONS, OCCASIONAL (ICD-785.1) Assessment: Unchanged  Her updated medication list for this problem includes:    Toprol Xl 25 Mg Xr24h-tab (Metoprolol succinate) .Marland Kitchen... 1 tab daily  Problem # 8:  BACK PAIN, CHRONIC (ICD-724.5) Assessment: Unchanged  Her updated medication list for this problem includes:    Etodolac 400 Mg Tabs (Etodolac) .Marland Kitchen... 1 tab two times a day pc    Cyclobenzaprine Hcl 10 Mg Tabs (Cyclobenzaprine hcl) .Marland Kitchen... 1 by mouth qid for mucle spasm and pain, fill on schedule    Oxycodone Hcl 20 Mg Tabs (Oxycodone hcl) .Marland Kitchen... 1 q4h as needed pain , not over 4 per day fill 09/17/10    Oxycodone Hcl 20 Mg Tabs (Oxycodone hcl) .Marland Kitchen... 1q4-6 h as needed pain, not over 4 per day, fill on 10/20/10    Oxycodone Hcl 20 Mg Tabs (Oxycodone hcl) .Marland Kitchen... 1 q4-6 h as needed pain, not over 4 per day, fill feb 22,2012  Complete Medication List: 1)  Etodolac 400 Mg Tabs (Etodolac) .Marland Kitchen.. 1 tab two times a day pc 2)  Lisinopril 10 Mg Tabs (Lisinopril) .Marland Kitchen.. 1 by mouth once daily 3)  Alprazolam 1 Mg Tabs (Alprazolam) .Marland Kitchen.. 1 three times a day for stress 4)  Zolpidem Tartrate 10 Mg Tabs (Zolpidem tartrate) .Marland Kitchen.. 1 by mouth q hs as needed sleep 5)   Triamterene-hctz 75-50 Mg Tabs (Triamterene-hctz) .Marland Kitchen.. 1 tab as needed edema 6)  Toprol Xl 25 Mg Xr24h-tab (Metoprolol succinate) .Marland Kitchen.. 1 tab daily 7)  Cyclobenzaprine Hcl 10 Mg Tabs (Cyclobenzaprine hcl) .Marland Kitchen.. 1 by mouth qid for mucle spasm and pain, fill on schedule 8)  Oxycodone Hcl 20 Mg Tabs (Oxycodone hcl) .Marland Kitchen.. 1 q4h as needed pain , not over 4 per day fill 09/17/10 9)  Cymbalta 30 Mg Cpep (Duloxetine hcl) .Marland Kitchen.. 1 cap by mouth daily x 7 days then increase to 60mg  daily 10)  Cymbalta 60 Mg Cpep (Duloxetine hcl) .Marland Kitchen.. 1 cap by mouth daily aftewr a week of 30mg  tabs 11)  Oxycodone Hcl 20 Mg Tabs (Oxycodone hcl) .Marland Kitchen.. 1q4-6 h as needed pain, not over 4 per day, fill on 10/20/10 12)  Oxycodone Hcl 20 Mg Tabs (Oxycodone hcl) .Marland Kitchen.. 1 q4-6 h as needed pain, not over 4 per day, fill feb 22,2012  Patient Instructions: 1)  refill medications 2)  Return for physical examination complete 3)   and appropriate 4)  You need to lose  weight. Consider a lower calorie diet and regular exercise.  Prescriptions: CYCLOBENZAPRINE HCL 10 MG TABS (CYCLOBENZAPRINE HCL) 1 by mouth qid for mucle spasm and pain, fill on schedule  #120 x 11   Entered and Authorized by:   Judithann Sheen MD   Signed by:   Judithann Sheen MD on 09/17/2010   Method used:   Electronically to        [facility]* (retail)       [address] [place], [place]  [address]       Ph: [phone]       Fax: [phone]   RxID:   [id number] CYCLOBENZAPRINE HCL 10 MG TABS (CYCLOBENZAPRINE HCL) 1 by mouth qid for mucle spasm and pain, fill on schedule  #120 x 11   Entered and Authorized by:   Judithann Sheen MD   Signed by:   Judithann Sheen MD on 09/17/2010   Method used:   Print then Give to Patient   RxID:   [id number] OXYCODONE HCL 20 MG TABS (OXYCODONE HCL) 1 q4h as needed pain , not over 4 per day fill 09/17/10  #120 x 0   Entered and Authorized by:   Judithann Sheen MD    Signed by:   Judithann Sheen MD on 09/17/2010   Method used:   Print then Give to Patient   RxID:   [id number] OXYCODONE HCL 20 MG TABS (OXYCODONE HCL) 1 q4h as needed pain , not over 4 per 24 hour period, to fill on 12 22 11   #120 x 0   Entered and Authorized by:   Judithann Sheen MD   Signed by:   Judithann Sheen MD on 09/17/2010   Method used:   Print then Give to Patient   RxID:   [id number] OXYCODONE HCL 20 MG TABS (OXYCODONE HCL) 1 q4-6 h as needed pain, not over 4 per day, fill Feb 22,2012  #120 x 0   Entered and Authorized by:   Judithann Sheen MD   Signed by:   Judithann Sheen MD on 09/17/2010   Method used:   Print then Give to Patient   RxID:   [id number] OXYCODONE HCL 20 MG TABS (OXYCODONE HCL) 1q4-6 h as needed pain, not over 4 per day, fill on 10/20/10  #120 x 0   Entered and Authorized by:   Judithann Sheen MD   Signed by:   Judithann Sheen MD on 09/17/2010   Method used:   Print then Give to Patient   RxID:   [id number]    Orders Added: 1)  Est. Patient Level IV [31517]

## 2010-10-31 NOTE — Progress Notes (Signed)
   DDS Request Received sent to [facility]. [name] [name]  October 07, 2010 12:49 PM

## 2010-10-31 NOTE — Progress Notes (Signed)
----   Converted from flag ---- ---- 10/18/2010 7:59 AM, Edwyna Perfect MD wrote: xray normal except moderate amount of stool in colon. should be trying laxative/enema. labs nl except mild elevation of pancreas test. suggest recent inflammation/mild pancreatitis. avoid alcohol. let us know if abd symptoms don't go away in next several days ------------------------------  Phone Note Outgoing Call   Call placed by: Kyung Rudd, CMA,  October 18, 2010 10:17 AM Call placed to: Patient Details for Reason: results Summary of Call: pt did the enema and drank mag citrate as recommended by Dr Rodena Medin and per pt abd doesn't seem to be bloated and tender anymore. Pt notes she still has extremely sharp pain in her left shoulder...pls advise Initial call taken by: Kyung Rudd, CMA,  October 18, 2010 10:21 AM  Follow-up for Phone Call        good news about abd. as for left shoulder. likely musculoskeletal especially if worse with movement/position change. use topical heat and pain medication as needed. see if improves Follow-up by: Edwyna Perfect MD,  October 18, 2010 10:27 AM  Additional Follow-up for Phone Call Additional follow up Details #1::        left message to call back Additional Follow-up by: Kyung Rudd, CMA,  October 18, 2010 4:56 PM  New Problems: MYALGIA (ICD-729.1)   Additional Follow-up for Phone Call Additional follow up Details #2::    pt returned call after business hours on Friday and before business hours on Monday. Message states that she is having a lot of shoulder pain and would like a referral. "I don't know if it's a bone spur, a pintched nerve, a ruptured disc..." She also states that she requested for blood work to be done to test for rheumatoid arthritis and wants to know if it was done and the outcome if it was done. Ms. Hanneman also says she needs needs someone to give her a dx of fibromyalgia. She thinks it's what she has and it has been confirmed, per the  pt, by other staff and doctors but no one has given her an official dx...she wasnts a referral or to see someone who can and will give her the dx. Pt feels like she is getting mixed information from Dr. Scotty Court and Dr. Rodena Medin about her situation. Says she would like to continue with seeing Dr. Rodena Medin but wants a referral  Follow-up by: Kyung Rudd, CMA,  October 21, 2010 8:35 AM  Additional Follow-up for Phone Call Additional follow up Details #3:: Details for Additional Follow-up Action Taken: she did not request that bloodwork from me. the majority of her conversation with me was about her abd symptoms and her narcotics. if she wishes to be officially evaluatd for fibromyalgia then will make rheum referral. her pmd is Dr. Scotty Court. I saw her as a work in only. Additional Follow-up by: Edwyna Perfect MD,  October 21, 2010 8:39 AM  New Problems: MYALGIA (ICD-729.1)  Appended Document:  pt aware

## 2010-10-31 NOTE — Progress Notes (Signed)
Summary: pt has question re appt   Phone Note Call from Patient   Caller: Patient [phone] Reason for Call: Talk to Nurse Summary of Call: pt went to er with chest pain/left arm/shoulder pain-told she has a bile blockage-nothing coronary as far as they could tell told her to fu with Almus Woodham, offered her 2-14, pt not sure that's soon enough Initial call taken by: Glynda Jaeger,  October 18, 2010 3:38 PM  Follow-up for Phone Call        Left message to call back, may need to see dr hodgin instead of Laurrie Toppin for bile blockage Deliah Goody, RN  October 18, 2010 4:47 PM  spoke with pt, she has not had any further problems with chest discomfort but she states she received a letter from [place] to make a follow up. appt scheduled Deliah Goody, RN  October 22, 2010 1:20 PM

## 2010-10-31 NOTE — Progress Notes (Signed)
Summary: Clarification on Rx  Phone Note Call from Patient Call back at Home Phone [phone]   Summary of Call: Pt is trying to get cyclobenzaprine filled at pharmacy for #120, however the pharmacy states they only have record of physician prescribing #90.  Please clarify. Initial call taken by: Trixie Dredge,  October 03, 2010 10:48 AM  Follow-up for Phone Call        Pt is calling back about previous note, and is asking to speak to someone that can help her.  Paged nurse. Follow-up by: Lynann Beaver CMA AAMA,  October 04, 2010 12:25 PM  Additional Follow-up for Phone Call Additional follow up Details #1::        Pt is calling back again to speak to [place]. Additional Follow-up by: Lynann Beaver CMA AAMA,  October 04, 2010 1:33 PM    Prescriptions: CYCLOBENZAPRINE HCL 10 MG TABS (CYCLOBENZAPRINE HCL) 1 by mouth qid for mucle spasm and pain, fill on schedule  #30 x 0   Entered by:   Lynann Beaver CMA AAMA   Authorized by:   Judithann Sheen MD   Signed by:   Lynann Beaver CMA AAMA on 10/04/2010   Method used:   Electronically to        [facility]* (retail)       [address] [place], [place]  [address]       Ph: [phone]       Fax: [phone]   RxID:   [id number]  Per Dr. Scotty Court.

## 2010-11-05 ENCOUNTER — Telehealth: Payer: Self-pay | Admitting: *Deleted

## 2010-11-05 NOTE — Telephone Encounter (Signed)
Wants to talk to Dr. Suzanne Boron about number of Cyclobenzaprine she is getting.

## 2010-11-06 ENCOUNTER — Telehealth: Payer: Self-pay | Admitting: Family Medicine

## 2010-11-06 NOTE — Telephone Encounter (Signed)
Pt called and said that Clonazepam was originally written for #90, and was suppose to have been changed to #120, by Dr Scotty Court. According to, [facility] ,there are no refills and script was not changed to #120. Pt is req that script be rewritten as #120 with refills and called in to [facility].

## 2010-11-08 NOTE — Telephone Encounter (Signed)
ATTEMPTED TO CALL PT, UNABLE TO SPEAK WITH PT

## 2010-11-18 NOTE — Telephone Encounter (Signed)
rx changed

## 2010-11-19 ENCOUNTER — Other Ambulatory Visit: Payer: Self-pay | Admitting: Family Medicine

## 2010-11-20 ENCOUNTER — Other Ambulatory Visit: Payer: Self-pay | Admitting: Family Medicine

## 2010-11-21 ENCOUNTER — Other Ambulatory Visit: Payer: Self-pay | Admitting: Family Medicine

## 2010-11-21 ENCOUNTER — Telehealth: Payer: Self-pay | Admitting: Family Medicine

## 2010-11-21 NOTE — Telephone Encounter (Signed)
Pt needs refill of alprazolam 1 mg sent to [facility] [phone]

## 2010-11-26 NOTE — Telephone Encounter (Signed)
Refilled alprazolam and clorazepam

## 2010-12-09 ENCOUNTER — Encounter: Payer: Self-pay | Admitting: Family Medicine

## 2010-12-13 ENCOUNTER — Encounter: Payer: Self-pay | Admitting: Cardiovascular Disease

## 2010-12-19 ENCOUNTER — Encounter: Payer: Self-pay | Admitting: Family Medicine

## 2010-12-19 ENCOUNTER — Ambulatory Visit (INDEPENDENT_AMBULATORY_CARE_PROVIDER_SITE_OTHER): Payer: BC Managed Care – PPO | Admitting: Family Medicine

## 2010-12-19 ENCOUNTER — Telehealth: Payer: Self-pay | Admitting: Family Medicine

## 2010-12-19 VITALS — BP 138/90 | HR 126 | Temp 98.0°F | Wt 231.0 lb

## 2010-12-19 DIAGNOSIS — K529 Noninfective gastroenteritis and colitis, unspecified: Secondary | ICD-10-CM

## 2010-12-19 DIAGNOSIS — K5289 Other specified noninfective gastroenteritis and colitis: Secondary | ICD-10-CM

## 2010-12-19 DIAGNOSIS — I10 Essential (primary) hypertension: Secondary | ICD-10-CM

## 2010-12-19 DIAGNOSIS — IMO0001 Reserved for inherently not codable concepts without codable children: Secondary | ICD-10-CM

## 2010-12-19 DIAGNOSIS — M129 Arthropathy, unspecified: Secondary | ICD-10-CM

## 2010-12-19 DIAGNOSIS — M1990 Unspecified osteoarthritis, unspecified site: Secondary | ICD-10-CM

## 2010-12-19 DIAGNOSIS — M791 Myalgia, unspecified site: Secondary | ICD-10-CM

## 2010-12-19 DIAGNOSIS — M549 Dorsalgia, unspecified: Secondary | ICD-10-CM

## 2010-12-19 DIAGNOSIS — G8929 Other chronic pain: Secondary | ICD-10-CM

## 2010-12-19 MED ORDER — OXYCODONE HCL 20 MG PO TABS: 120.0000 mg | ORAL_TABLET | Freq: Four times a day (QID) | ORAL | Status: DC | PRN

## 2010-12-19 MED ORDER — QUETIAPINE FUMARATE 25 MG PO TABS: 25.0000 mg | ORAL_TABLET | Freq: Every day | ORAL | Status: AC

## 2010-12-19 MED ORDER — OXYCODONE HCL 20 MG PO TABS: 20.0000 mg | ORAL_TABLET | Freq: Four times a day (QID) | ORAL | Status: DC | PRN

## 2010-12-19 MED ORDER — PROMETHAZINE HCL 25 MG PO TABS: 25.0000 mg | ORAL_TABLET | Freq: Four times a day (QID) | ORAL | Status: DC | PRN

## 2010-12-19 NOTE — Telephone Encounter (Signed)
Pharmacy needed clarification of phenergan rx; 1-2 every 4 to 6 hours----done

## 2010-12-19 NOTE — Patient Instructions (Signed)
You have gastroenteritis, diet should be clear liquids as gatorade, coke ,pesi water, gadually increase to soup as chicken noodle,rice soup. Promethazine tabs for nausea and Imodium for diarrhwa To start seroquel for isomnia and depression Continue other medications for poluarthralgia and fibromyalgia Refilled oxycodone Will write leter after  You see the fibromyalgia specialist

## 2010-12-23 NOTE — Progress Notes (Signed)
  Subjective:    Patient ID: [name] [name], female    DOB: [birth year], 50 y.o.   MRN: [id number] This 50 year old white single female with chronic anxiety on alprazolam 1 mg q.i.d. Chronic arthritis, chronic back pain peripheral edema as well as chronic insomnia and requested a trial of Seroquel to help her sleep. Has hypertension which is controlled Her most acute complaints as she said vomiting diarrhea over the past week was improved slightly She went to see a rheumatologist who diagnosed her as polyarthropathy but not his fibromyalgia and the patient is to see a specialist in fibromyalgia regarding her possible diagnosis. Patient has filed for disability and chronic aching joint pain muscle aches HPI    Review of Systems see history of present illness no other complaints or symptoms     Objective:   Physical Exam the patient is well-developed well-nourished morbidly obese white female who appears acutely old with nausea, vomiting and diarrhea HEENT negative Lungs are clear to palpation percussion off sedation Heart exam normal Abdominal exam reveals mild general tenderness namely increased bowel sounds no masses felt liver spleen kidneys are nonpalpable No edema        Assessment & Plan:   acute gastroenteritis 2 treat with clear liquids gradually increased to semi solids, treat nausea with promethazine and diarrhea with Imodium Chronic back pain arthritis will refill her oxycodone and other necessary medications Hypertension controllled no change oximetry all hydrochlorothiazide and metacarpal Chronic insomnia to be treated with seroquel. 25 mg at bedtime

## 2010-12-26 ENCOUNTER — Other Ambulatory Visit: Payer: Self-pay | Admitting: Family Medicine

## 2010-12-26 ENCOUNTER — Ambulatory Visit: Payer: Self-pay | Admitting: Cardiovascular Disease

## 2010-12-27 ENCOUNTER — Telehealth: Payer: Self-pay | Admitting: Family Medicine

## 2010-12-27 NOTE — Telephone Encounter (Signed)
Pt would like Dr. Scotty Court to call her back at his earliest convince.  She would like a referral regarding a diagnosis and she needs a letter. Please advise

## 2011-01-01 ENCOUNTER — Telehealth: Payer: Self-pay | Admitting: *Deleted

## 2011-01-01 NOTE — Telephone Encounter (Signed)
Pt is calling back and asking if Dr. Scotty Court would call her ASAP.

## 2011-01-01 NOTE — Telephone Encounter (Signed)
Was referred to Dr. Kellie Simmering, and given a diagnosis of polyarthralgia, and wants another referral to DR. Gay in [place], Publishing rights manager.

## 2011-01-02 NOTE — Telephone Encounter (Signed)
Pt is calling and stating she needs to talk to Dr. Scotty Court tonight very badly.

## 2011-01-07 NOTE — Telephone Encounter (Signed)
To refer to Dr. Cardell Peach

## 2011-01-13 ENCOUNTER — Other Ambulatory Visit: Payer: Self-pay | Admitting: Family Medicine

## 2011-01-14 NOTE — Telephone Encounter (Signed)
Does not need referral to DR. Gay, has appt with Dr. Kevan Rosebush

## 2011-01-21 ENCOUNTER — Ambulatory Visit (INDEPENDENT_AMBULATORY_CARE_PROVIDER_SITE_OTHER): Payer: BC Managed Care – PPO | Admitting: Family Medicine

## 2011-01-21 DIAGNOSIS — F329 Major depressive disorder, single episode, unspecified: Secondary | ICD-10-CM

## 2011-01-21 DIAGNOSIS — F32A Depression, unspecified: Secondary | ICD-10-CM

## 2011-01-21 DIAGNOSIS — M797 Fibromyalgia: Secondary | ICD-10-CM

## 2011-01-21 DIAGNOSIS — IMO0001 Reserved for inherently not codable concepts without codable children: Secondary | ICD-10-CM

## 2011-01-21 DIAGNOSIS — F341 Dysthymic disorder: Secondary | ICD-10-CM

## 2011-01-21 DIAGNOSIS — M549 Dorsalgia, unspecified: Secondary | ICD-10-CM

## 2011-01-21 DIAGNOSIS — I10 Essential (primary) hypertension: Secondary | ICD-10-CM

## 2011-01-21 DIAGNOSIS — G8929 Other chronic pain: Secondary | ICD-10-CM

## 2011-01-21 MED ORDER — GABAPENTIN 300 MG PO CAPS: 300.0000 mg | ORAL_CAPSULE | Freq: Three times a day (TID) | ORAL | Status: DC

## 2011-02-05 ENCOUNTER — Telehealth: Payer: Self-pay | Admitting: Family Medicine

## 2011-02-05 DIAGNOSIS — M797 Fibromyalgia: Secondary | ICD-10-CM

## 2011-02-05 NOTE — Telephone Encounter (Signed)
Needs an updated referral to see Dr Titus Dubin. She sees her. But the office needs a new one. Please call her at [phone]. Her appt has not been made yet. Wants to get in next week if possible.

## 2011-02-06 ENCOUNTER — Ambulatory Visit: Payer: Self-pay | Admitting: Family Medicine

## 2011-02-11 ENCOUNTER — Telehealth: Payer: Self-pay

## 2011-02-11 NOTE — Telephone Encounter (Signed)
Referal for Dr. Titus Dubin ok per Dr. Scotty Court

## 2011-02-11 NOTE — Assessment & Plan Note (Signed)
[facility]                            CARDIOLOGY OFFICE NOTE   NAME:[name] [name] MCEVER                       MRN:          [id number]  DATE:12/06/2008                            DOB:          [birth year]    A 50 year old patient referred for multiple complaints including rapid  heart rate, palpitations, heart quivering, atypical chest pain, dyspnea,  hypertension.   The patient has had a couple of episodes recently of quivering of the  heart.  She has felt her heart beating rapidly.  She was seen in the  emergency room recently.  She was in sinus rhythm with sinus tach.  There was no evidence of atrial arrhythmias.  She ruled out for  myocardial infarction.  I do not see recent thyroid studies on her.   She has no history of anemia.   She has also had somewhat atypical chest pain with some sharp pains in  the middle of her chest that can radiate to her shoulders and down her  arms.  They are not necessarily associated with exercise.  She is very  sedentary.  She claims to have lost 25 pounds recently, but continues to  be over 240 pounds.   She has not had a previous stress test or echo.   The patient previously was on a diuretic.  When she was seen in the  emergency room, this was stopped and she was started on lisinopril for  hypertension.   Review of systems is otherwise negative.   Her past medical history is remarkable for no previous surgery.  She has  had central obesity, a question of hypertension, palpitations,  arthritis.   She has no known allergies.   Family history is noncontributory.   The patient is single.  She does not have children.  She is a nonsmoker  and nondrinker.  She has family in the area, but lives by herself.   She is currently unemployed and use to work in the [employer].  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   Meds only include:  1. Etodolac 40 b.i.d.  2. Lisinopril 10 a day.   PHYSICAL EXAMINATION:  GENERAL:  Remarkable for an obese female in no  distress.  VITAL SIGNS:  Blood pressure is 130/80, pulse is 88 and regular,  respiratory 14 and afebrile, weight 240.  HEENT:  Unremarkable.  NECK:  Carotids normal without bruit.  No lymphadenopathy, thyromegaly,  or JVP elevation.  LUNGS:  Clear.  Good diaphragmatic motion.  No wheezing.  HEART:  S1 and S2 with distant heart sounds.  PMI not palpable.  ABDOMEN:  Benign.  Bowel sounds positive.  No AAA.  No tenderness.  No  bruit.  No hepatosplenomegaly, hepatojugular reflux, or tenderness.  EXTREMITIES:  Distal pulses are intact.  No edema.  NEUROLOGIC:  Nonfocal.  SKIN:  Warm and dry.  MUSCULOSKELETAL:  No muscular weakness.   Multiple EKGs from the ER were reviewed.  They were from November 29, 2008.  They show poor R-wave progression,  but were sinus rhythm.   IMPRESSION:  1. Atypical chest pain.  Abnormal EKG likely representing artifact      from large breast tissue.  Check 2-D echocardiogram to rule out      anterior wall myocardial infarction.  Check stress Myoview to rule      out ischemia.  2. Palpitations.  Depending on the results of her echo and Myoview,      she may need an event monitor.  They sound benign; however, since      her blood pressure is suboptimally controlled we will add Toprol 25      mg a day to her lisinopril.  3. Hypertension, currently on new therapy.  Continue a low-sodium      diet.  Encourage weight loss.  4. Central obesity.  Having a normal echo and stress test would make      it easier for the patient to entertain the idea of bariatric      surgery.  She is probably at least 50 pounds overweight.  Diet and      exercise prescriptions were described.  5. Arthritis.  Continue etodolac p.r.n.   Further recommendation will be based on results of her echo and Myoview.  I will see her after this to see how she is doing with her Toprol and  see if an  event monitor is needed.     Noralyn Pick. Eden Emms, MD, [facility]  Electronically Signed    PCN/MedQ  DD: 12/06/2008  DT: 12/07/2008  Job #: [id number]   cc:   Ellin Saba., MD

## 2011-02-11 NOTE — Telephone Encounter (Signed)
Received a voicemail from pt stating that she needed a referral to Dr. Abelina Bachelor and that her depression was worse.  Pt stated that she was having suicidal thoughts since she had been prescribed Gabapentin.  Upon calling pt back and getting details pt stated she had used the term suicidal thoughts lightly.  Pt stated she felt the new medication Gabapentin had amplified her depression and suicidal thoughts. Pt also stated that she had some unusual circumstances that had caused depression as well.  Dr.Staffod suggested that the pt go to [facility]. Pt declined and requested to go off the medication instead. Dr. Scotty Court told pt to go off Gabapentin and pt agreed.

## 2011-02-13 ENCOUNTER — Telehealth: Payer: Self-pay

## 2011-02-13 NOTE — Telephone Encounter (Signed)
Pt wanted a referral to Dr. Corliss Skains and when the referral coordinator sent the request the doctor sent a fax back stating:  "we have nothing further to offer this client.".  Per request of Dr.Stafford the pt was called and she stated that she had never been to an appt with the doctor.  The pt said the office of Dr. Corliss Skains sent her some forms to fill out and she had to cancel her appointment.  When the pt tried to reschedule the office told her she needed another referral.  Pt said the office stated that they treated fibromyalgia.

## 2011-02-15 ENCOUNTER — Encounter: Payer: Self-pay | Admitting: Family Medicine

## 2011-02-15 NOTE — Progress Notes (Signed)
  Subjective:    Patient ID: [name] [name], female    DOB: Feb 01, 1961, 50 y.o.   MRN: [id number] This 50 year old white single female is in for refill of medications, violation of hypertension constipation complains of feeling flushed. She also relates that she stopped Cymbalta because it was causing headaches she related she did get a mammogram which was negative dimension a rheumatologist diagnosed her as fibromyalgia and 2010 she has chronic back pain with degenerative arthritis she is on metoprolol prescribed by Dr.Nishan HPI    Review of Systemssee history of present illness     Objective:   Physical Exam the patient is a well-developed well-nourished obese white female who is alert and cooperative HEENT negative Heart and lungs normal Spine tenderness of the lumbosacral spine admission blood pressure was 110/78 Neurological examination negative       Assessment & Plan:  Chronic back pain and fibromyalgia 2 refill medications and continue same therapy Hypertension is well controlled Plan to refill her oxycodone and continue her regular treatment for hypertension and anxiety patient is very anxious and is being treated with alprazolam 1 mg q.i.d.

## 2011-02-15 NOTE — Patient Instructions (Signed)
My oppression is that her flushing of the face is not due to hypertension but is due to anxiety and stress No other abnormalities found so are recommended to continue the same medications for your pain from chronic back pain as well as fibromyalgia continue her hypertensive medication as this is not the cause of your flushing

## 2011-02-17 ENCOUNTER — Other Ambulatory Visit: Payer: Self-pay | Admitting: Family Medicine

## 2011-02-20 ENCOUNTER — Other Ambulatory Visit: Payer: Self-pay

## 2011-02-20 DIAGNOSIS — M1990 Unspecified osteoarthritis, unspecified site: Secondary | ICD-10-CM

## 2011-02-20 DIAGNOSIS — M797 Fibromyalgia: Secondary | ICD-10-CM

## 2011-02-20 MED ORDER — ALPRAZOLAM 1 MG PO TABS: 1.0000 mg | ORAL_TABLET | Freq: Three times a day (TID) | ORAL | Status: DC | PRN

## 2011-02-20 NOTE — Telephone Encounter (Signed)
rx sent to pharmacy; called pt about referral and pt is request a referral to Dr. Gay---ok per Dr. Scotty Court pt requested to go on Prozac, per Dr. Scotty Court pt will remain on current treatment.

## 2011-03-05 ENCOUNTER — Telehealth: Payer: Self-pay

## 2011-03-05 NOTE — Telephone Encounter (Signed)
Pt wanted a referral to Dr. Cardell Peach in [place]; per Dr. Dillard Essex office " pt has Fibromyalgia dx already and they do not treat for this".  Per Dr. Scotty Court suggest pt go to [facility]. Pt aware of this and would like Dr. Scotty Court to prescribe an antidepressant (Prozac) if possible since pt does not have an appt with Dr. Dillard Essex office.  Pt also request that rx of Oxycodone for next 3 months

## 2011-03-06 MED ORDER — FLUOXETINE HCL 10 MG PO TABS: 10.0000 mg | ORAL_TABLET | Freq: Every day | ORAL | Status: DC

## 2011-03-06 NOTE — Telephone Encounter (Signed)
Per Dr.Stafford pt must fill rx for oxycodone on time.  Dr. Scotty Court authorized prozac 10mg 

## 2011-03-07 NOTE — Telephone Encounter (Signed)
Pt stated she just wants to get rx for next 3 months so she can drop them off to pharmacy

## 2011-03-18 ENCOUNTER — Ambulatory Visit: Payer: Self-pay | Admitting: Family Medicine

## 2011-03-19 ENCOUNTER — Other Ambulatory Visit: Payer: Self-pay

## 2011-03-19 MED ORDER — OXYCODONE HCL 20 MG PO TABS: 120.0000 mg | ORAL_TABLET | Freq: Four times a day (QID) | ORAL | Status: DC | PRN

## 2011-03-19 MED ORDER — OXYCODONE HCL 20 MG PO TABS: 20.0000 mg | ORAL_TABLET | Freq: Four times a day (QID) | ORAL | Status: DC | PRN

## 2011-03-19 NOTE — Telephone Encounter (Signed)
Ok per Dr. Scotty Court to fill pt's rx for oxycodone for the next 3 months.  Pt is aware ready for pickup.

## 2011-03-19 NOTE — Telephone Encounter (Signed)
rx ready for pickup 

## 2011-04-25 ENCOUNTER — Encounter: Payer: Self-pay | Admitting: Family Medicine

## 2011-05-21 ENCOUNTER — Other Ambulatory Visit: Payer: Self-pay | Admitting: Family Medicine

## 2011-06-18 ENCOUNTER — Ambulatory Visit (INDEPENDENT_AMBULATORY_CARE_PROVIDER_SITE_OTHER): Payer: BC Managed Care – PPO | Admitting: Internal Medicine

## 2011-06-18 ENCOUNTER — Encounter: Payer: Self-pay | Admitting: Internal Medicine

## 2011-06-18 ENCOUNTER — Ambulatory Visit: Payer: BC Managed Care – PPO | Admitting: Family Medicine

## 2011-06-18 VITALS — BP 140/94 | HR 68 | Temp 98.2°F | Ht 65.5 in | Wt 228.0 lb

## 2011-06-18 DIAGNOSIS — M549 Dorsalgia, unspecified: Secondary | ICD-10-CM

## 2011-06-18 DIAGNOSIS — N390 Urinary tract infection, site not specified: Secondary | ICD-10-CM

## 2011-06-18 LAB — POCT URINALYSIS DIPSTICK
Bilirubin, UA: NEGATIVE
Glucose, UA: NEGATIVE
Leukocytes, UA: NEGATIVE
Nitrite, UA: NEGATIVE

## 2011-06-18 MED ORDER — OXYCODONE HCL 20 MG PO TABS: 20.0000 mg | ORAL_TABLET | Freq: Four times a day (QID) | ORAL | Status: DC | PRN

## 2011-06-18 NOTE — Progress Notes (Signed)
Subjective:    Patient ID: [name] [name], female    DOB: 06/28/1961, 50 y.o.   MRN: [id number]  HPI  50 year old white female patient of Dr. Scotty Court for followup. She reports chronic history of low back pain she has been taking oxycodone for several years. She denies constipation.  She also has history of fibromyalgia.  She reports signing pain contract with Dr. Scotty Court.  She denies history of previous back surgery. She describes low back pain as intermittent sharp pain that can be 9/10 with prolonged standing or sitting. Her current pain is 6/10. She is not sure "whether I have a pinched nerve"  She is in the process of filing for disability. She previously worked as an Horticulturist, commercial.  Review of Systems See HPI  Past Medical History  Diagnosis Date  . Ovarian cyst, bilateral   . Chronic back pain   . Morbid obesity   . Hypertension   . Anxiety and depression     chronic  . History of fibromyalgia     suspected   . Insomnia     History   Social History  . Marital Status: Divorced    Spouse Name: N/A    Number of Children: N/A  . Years of Education: N/A   Occupational History  . Not on file.   Social History Main Topics  . Smoking status: Former Games developer  . Smokeless tobacco: Not on file  . Alcohol Use: No     occassionally  . Drug Use: Yes    Special: Oxycodone  . Sexually Active: Not on file   Other Topics Concern  . Not on file   Social History Narrative  . No narrative on file    No past surgical history on file.  No family history on file.  Allergies  Allergen Reactions  . Cymbalta (Duloxetine Hcl) Other (See Comments)    Headaches    Current Outpatient Prescriptions on File Prior to Visit  Medication Sig Dispense Refill  . ALPRAZolam (XANAX) 1 MG tablet Take 1 tablet (1 mg total) by mouth 3 (three) times daily as needed.  90 tablet  5  . cyclobenzaprine (FLEXERIL) 10 MG tablet TAKE ONE TABLET BY MOUTH THREE TIMES DAILY AS NEEDED FOR  MUSCLE SPASM  90 tablet  5  . etodolac (LODINE) 400 MG tablet Take 400 mg by mouth 2 (two) times daily after a meal.        . FLUoxetine (PROZAC) 10 MG tablet Take 1 tablet (10 mg total) by mouth daily.  30 tablet  11  . lisinopril (PRINIVIL,ZESTRIL) 10 MG tablet TAKE ONE TABLET BY MOUTH EVERY DAY  30 tablet  10  . metoprolol succinate (TOPROL-XL) 25 MG 24 hr tablet TAKE ONE TABLET BY MOUTH EVERY DAY  30 tablet  2  . triamterene-hydrochlorothiazide (MAXZIDE) 75-50 MG per tablet Take 1 tablet by mouth daily. For edema        . zolpidem (AMBIEN) 10 MG tablet TAKE ONE TABLET BY MOUTH AT BEDTIME AS NEEDED FOR SLEEP  30 tablet  5  . gabapentin (NEURONTIN) 300 MG capsule Take 1 capsule (300 mg total) by mouth 3 (three) times daily.  90 capsule  5    BP 140/94  Pulse 68  Temp(Src) 98.2 F (36.8 C) (Oral)  Ht 5' 5.5" (1.664 m)  Wt 228 lb (103.42 kg)  BMI 37.36 kg/m2        Objective:   Physical Exam   Constitutional:  pleasant, overweight appearing Cardiovascular:  Normal rate, regular rhythm and normal heart sounds.  Exam reveals no gallop and no friction rub.   No murmur heard. Pulmonary/Chest: Effort normal and breath sounds normal.  No wheezes. No rales.  Neurological: Alert. No cranial nerve deficit.  lower extremity strength is 4-5 bilaterally. Patient able to toe and heel walk with mild difficulty.  Her gait is normal.  Patellar reflexes +3 bilaterally.  Sensation is normal Skin: Skin is warm and dry.  Psychiatric: Flat affect     Assessment & Plan:

## 2011-06-18 NOTE — Assessment & Plan Note (Signed)
50 year old white female with history of chronic low back pain. I refilled her pain medication x1 month.  I suggest she followup with her primary care physician and consider MRI of her lumbar spine.  Referral to pain management or neurosurgeon may be a consideration.

## 2011-07-02 ENCOUNTER — Other Ambulatory Visit: Payer: Self-pay

## 2011-07-02 MED ORDER — OXYCODONE HCL 20 MG PO TABS: 20.0000 mg | ORAL_TABLET | Freq: Four times a day (QID) | ORAL | Status: DC | PRN

## 2011-07-02 NOTE — Telephone Encounter (Signed)
Pt called to see if she needed to make another appt to see another physician for her pain medication.    Per Dr. Artist Pais since pt was just seen on 06/18/2011 rx can be filled until Dr. Scotty Court returns.  Pt aware rx ready for pickup.

## 2011-07-09 ENCOUNTER — Ambulatory Visit: Payer: BC Managed Care – PPO | Admitting: Family Medicine

## 2011-08-06 ENCOUNTER — Ambulatory Visit: Payer: BC Managed Care – PPO | Admitting: Family Medicine

## 2011-08-08 ENCOUNTER — Encounter: Payer: Self-pay | Admitting: Internal Medicine

## 2011-08-08 ENCOUNTER — Ambulatory Visit (INDEPENDENT_AMBULATORY_CARE_PROVIDER_SITE_OTHER): Payer: BC Managed Care – PPO | Admitting: Internal Medicine

## 2011-08-08 VITALS — BP 118/74 | Temp 98.0°F | Wt 236.0 lb

## 2011-08-08 DIAGNOSIS — F329 Major depressive disorder, single episode, unspecified: Secondary | ICD-10-CM

## 2011-08-08 DIAGNOSIS — Z23 Encounter for immunization: Secondary | ICD-10-CM

## 2011-08-08 DIAGNOSIS — Z0000 Encounter for general adult medical examination without abnormal findings: Secondary | ICD-10-CM

## 2011-08-08 DIAGNOSIS — I10 Essential (primary) hypertension: Secondary | ICD-10-CM

## 2011-08-08 DIAGNOSIS — M549 Dorsalgia, unspecified: Secondary | ICD-10-CM

## 2011-08-08 DIAGNOSIS — E785 Hyperlipidemia, unspecified: Secondary | ICD-10-CM

## 2011-08-08 MED ORDER — LISINOPRIL 10 MG PO TABS: 10.0000 mg | ORAL_TABLET | Freq: Every day | ORAL | Status: DC

## 2011-08-08 MED ORDER — OXYCODONE HCL 20 MG PO TABS: 20.0000 mg | ORAL_TABLET | Freq: Four times a day (QID) | ORAL | Status: DC | PRN

## 2011-08-08 MED ORDER — TRIAMTERENE-HCTZ 75-50 MG PO TABS: 1.0000 | ORAL_TABLET | Freq: Every day | ORAL | Status: DC

## 2011-08-08 MED ORDER — METOPROLOL SUCCINATE ER 25 MG PO TB24: 25.0000 mg | ORAL_TABLET | Freq: Every day | ORAL | Status: DC

## 2011-08-08 MED ORDER — ZOLPIDEM TARTRATE 10 MG PO TABS: 10.0000 mg | ORAL_TABLET | Freq: Every evening | ORAL | Status: DC | PRN

## 2011-08-08 MED ORDER — ALPRAZOLAM 1 MG PO TABS: 1.0000 mg | ORAL_TABLET | Freq: Three times a day (TID) | ORAL | Status: DC | PRN

## 2011-08-08 MED ORDER — CYCLOBENZAPRINE HCL 10 MG PO TABS: 10.0000 mg | ORAL_TABLET | Freq: Three times a day (TID) | ORAL | Status: DC | PRN

## 2011-08-08 MED ORDER — ETODOLAC 400 MG PO TABS: 400.0000 mg | ORAL_TABLET | Freq: Two times a day (BID) | ORAL | Status: DC

## 2011-08-08 MED ORDER — GABAPENTIN 300 MG PO CAPS: 300.0000 mg | ORAL_CAPSULE | Freq: Three times a day (TID) | ORAL | Status: DC

## 2011-08-08 NOTE — Patient Instructions (Signed)
Followup with Dr. Rodena Medin at [facility] within the next month or a physician of your choice

## 2011-08-08 NOTE — Progress Notes (Signed)
  Subjective:    Patient ID: [name] [name], female    DOB: [birth year], 50 y.o.   MRN: [id number]  HPI  50 year old patient who is seen today for followup. She is a former Dr. Scotty Court  patient who lives in [facility] and will be establishing with another office. She has a history of chronic low back pain as well as fibromyalgia. She is on oxycodone 20 mg 4 times daily she has been seen by rheumatology Kellie Simmering) in the past. She is basically seen today for medication refills until she can establish with her new physician. She has treated hypertension dyslipidemia obesity. Medical regimen reviewed    Review of Systems  Constitutional: Negative.   HENT: Negative for hearing loss, congestion, sore throat, rhinorrhea, dental problem, sinus pressure and tinnitus.   Eyes: Negative for pain, discharge and visual disturbance.  Respiratory: Negative for cough and shortness of breath.   Cardiovascular: Negative for chest pain, palpitations and leg swelling.  Gastrointestinal: Negative for nausea, vomiting, abdominal pain, diarrhea, constipation, blood in stool and abdominal distention.  Genitourinary: Negative for dysuria, urgency, frequency, hematuria, flank pain, vaginal bleeding, vaginal discharge, difficulty urinating, vaginal pain and pelvic pain.  Musculoskeletal: Positive for back pain and arthralgias. Negative for joint swelling and gait problem.  Skin: Negative for rash.  Neurological: Negative for dizziness, syncope, speech difficulty, weakness, numbness and headaches.  Hematological: Negative for adenopathy.  Psychiatric/Behavioral: Negative for behavioral problems, dysphoric mood and agitation. The patient is not nervous/anxious.        Objective:   Physical Exam  Constitutional: She is oriented to person, place, and time. She appears well-developed and well-nourished.  HENT:  Head: Normocephalic.  Right Ear: External ear normal.  Left Ear: External ear normal.  Mouth/Throat: Oropharynx  is clear and moist.  Eyes: Conjunctivae and EOM are normal. Pupils are equal, round, and reactive to light.  Neck: Normal range of motion. Neck supple. No thyromegaly present.  Cardiovascular: Normal rate, regular rhythm, normal heart sounds and intact distal pulses.   Pulmonary/Chest: Effort normal and breath sounds normal.  Abdominal: Soft. Bowel sounds are normal. She exhibits no mass. There is no tenderness.  Musculoskeletal: Normal range of motion.  Lymphadenopathy:    She has no cervical adenopathy.  Neurological: She is alert and oriented to person, place, and time.  Skin: Skin is warm and dry. No rash noted.  Psychiatric: She has a normal mood and affect. Her behavior is normal.          Assessment & Plan:    Chronic pain syndrome. Will refill her oxycodone for one month. She will reestablish with another practice Hypertension well controlled Dyslipidemia

## 2011-09-08 ENCOUNTER — Ambulatory Visit (INDEPENDENT_AMBULATORY_CARE_PROVIDER_SITE_OTHER): Payer: BC Managed Care – PPO | Admitting: Internal Medicine

## 2011-09-08 ENCOUNTER — Encounter: Payer: Self-pay | Admitting: Internal Medicine

## 2011-09-08 DIAGNOSIS — R945 Abnormal results of liver function studies: Secondary | ICD-10-CM

## 2011-09-08 DIAGNOSIS — M549 Dorsalgia, unspecified: Secondary | ICD-10-CM

## 2011-09-08 DIAGNOSIS — R7989 Other specified abnormal findings of blood chemistry: Secondary | ICD-10-CM

## 2011-09-08 DIAGNOSIS — I10 Essential (primary) hypertension: Secondary | ICD-10-CM

## 2011-09-08 DIAGNOSIS — Z79899 Other long term (current) drug therapy: Secondary | ICD-10-CM

## 2011-09-08 DIAGNOSIS — F4310 Post-traumatic stress disorder, unspecified: Secondary | ICD-10-CM

## 2011-09-08 DIAGNOSIS — G8929 Other chronic pain: Secondary | ICD-10-CM

## 2011-09-08 MED ORDER — OXYCODONE HCL 20 MG PO TABS: 20.0000 mg | ORAL_TABLET | Freq: Four times a day (QID) | ORAL | Status: DC | PRN

## 2011-09-08 MED ORDER — CYCLOBENZAPRINE HCL 10 MG PO TABS: 10.0000 mg | ORAL_TABLET | Freq: Three times a day (TID) | ORAL | Status: DC | PRN

## 2011-09-08 MED ORDER — METOPROLOL SUCCINATE ER 25 MG PO TB24: 25.0000 mg | ORAL_TABLET | Freq: Every day | ORAL | Status: DC

## 2011-09-08 NOTE — Assessment & Plan Note (Signed)
Reviewed current narcotic dosing with pt. Aware of potential for addiction and/or tolerance. Recommend and will pursue pain clinic referral. Understands i will temporarily refill her oxycodone until established with pain clinic but ultimately do not feel comfortable assuming long term management of this medication. States understanding and agreement.

## 2011-09-08 NOTE — Progress Notes (Signed)
  Subjective:    Patient ID: [name] [name], female    DOB: [birth year], 50 y.o.   MRN: [id number]  HPI Pt presents to clinic for followup of multiple medical problems. Former pt of Dr. Scotty Court now retired. H/o chronic pain related to chronic back pain and fibromyalgia. tx'ed with longstanding narcotics with oxycodone 20mg  po qid prescribed by previous pmd. Does take 4 a day. Has been evaluated in the past by rheumatology. Also h/o PTSD and anxiety attacks taking xanax 1mg  tid prn. States has been reduced from previous higher dose. Takes xanax ~2x/week. Interested in seeing a therapist. HTN well controlled. Recalls mildly abn LFT's in the past and [place] summer 2012 reportedly demonstrating fatty liver. States mammogram utd. Applying for disability with assistance of a lawyer.  Past Medical History  Diagnosis Date  . Ovarian cyst, bilateral   . Chronic back pain   . Morbid obesity   . Hypertension   . Anxiety and depression     chronic  . History of fibromyalgia     suspected   . Insomnia    No past surgical history on file.  reports that she has quit smoking. She has never used smokeless tobacco. She reports that she drinks alcohol. She reports that she uses illicit drugs (Oxycodone). family history is not on file. Allergies  Allergen Reactions  . Cymbalta (Duloxetine Hcl) Other (See Comments)    Headaches     Review of Systems see hpi    Objective:   Physical Exam  Nursing note and vitals reviewed. Constitutional: She appears well-developed and well-nourished. No distress.  HENT:  Head: Normocephalic and atraumatic.  Right Ear: External ear normal.  Left Ear: External ear normal.  Nose: Nose normal.  Mouth/Throat: Oropharynx is clear and moist.  Eyes: Conjunctivae are normal. Pupils are equal, round, and reactive to light. Right eye exhibits no discharge. Left eye exhibits no discharge. No scleral icterus.  Neck: Neck supple. No thyromegaly present.  Cardiovascular: Normal rate,  regular rhythm and normal heart sounds.   Pulmonary/Chest: Effort normal and breath sounds normal. No respiratory distress. She has no wheezes. She has no rales.  Neurological: She is alert.  Skin: She is not diaphoretic.  Psychiatric: She has a normal mood and affect. Her behavior is normal.          Assessment & Plan:

## 2011-09-08 NOTE — Assessment & Plan Note (Signed)
Normotensive and stable. Continue current regimen. Monitor bp as outpt and followup in clinic as scheduled. Obtain cbc and chem7 

## 2011-09-08 NOTE — Assessment & Plan Note (Signed)
Repeat lft

## 2011-09-08 NOTE — Assessment & Plan Note (Signed)
Encourage sparing use of xanax. Aware of potential for addiction and/or tolerance. Referral for therapist.

## 2011-09-09 LAB — HEPATIC FUNCTION PANEL
AST: 28 U/L (ref 0–37)
Albumin: 4.4 g/dL (ref 3.5–5.2)
Total Bilirubin: 0.5 mg/dL (ref 0.3–1.2)
Total Protein: 7 g/dL (ref 6.0–8.3)

## 2011-09-09 LAB — BASIC METABOLIC PANEL
BUN: 14 mg/dL (ref 6–23)
Glucose, Bld: 102 mg/dL — ABNORMAL HIGH (ref 70–99)
Potassium: 4.7 mEq/L (ref 3.5–5.3)

## 2011-09-09 LAB — CBC
HCT: 41.3 % (ref 36.0–46.0)
Hemoglobin: 13.6 g/dL (ref 12.0–15.0)
MCH: 31.9 pg (ref 26.0–34.0)
MCHC: 32.9 g/dL (ref 30.0–36.0)

## 2011-09-15 ENCOUNTER — Telehealth: Payer: Self-pay | Admitting: *Deleted

## 2011-09-15 DIAGNOSIS — Z79899 Other long term (current) drug therapy: Secondary | ICD-10-CM

## 2011-09-15 MED ORDER — TRIAMTERENE-HCTZ 50-25 MG PO CAPS: 1.0000 | ORAL_CAPSULE | ORAL | Status: DC

## 2011-09-15 NOTE — Telephone Encounter (Signed)
Rx for Dyazide 50-25 mg  Qty 30 Rf 3 sent to pharmacy. Lab order entered for March 2013.

## 2011-10-06 ENCOUNTER — Telehealth: Payer: Self-pay | Admitting: Internal Medicine

## 2011-10-06 NOTE — Telephone Encounter (Signed)
Ok to print for one month. Further rf's per the pain clinic

## 2011-10-06 NOTE — Telephone Encounter (Signed)
Patient states she has appointment with Pain Management   Jan 21   But will be out of her pain medication on Thursday, please call patient

## 2011-10-07 ENCOUNTER — Telehealth: Payer: Self-pay | Admitting: *Deleted

## 2011-10-07 MED ORDER — OXYCODONE HCL 20 MG PO TABS: 20.0000 mg | ORAL_TABLET | Freq: Four times a day (QID) | ORAL | Status: DC | PRN

## 2011-10-07 NOTE — Telephone Encounter (Signed)
Patient called and left voice message stating she is switching her medication to another pharmacy. She is requesting a return phone call regarding the change in pharmacies.

## 2011-10-07 NOTE — Telephone Encounter (Signed)
Call placed to patient at [phone], no answer. A voice message was left informing patient Rx left at front desk for pick up

## 2011-10-08 NOTE — Telephone Encounter (Signed)
Call placed to patient at [phone], no answer. A voice message was left informing patient call was returned. She was asked to call back with more detailed information regarding her request.

## 2011-10-10 NOTE — Telephone Encounter (Signed)
No return call from patient.

## 2011-10-27 ENCOUNTER — Other Ambulatory Visit: Payer: Self-pay | Admitting: Internal Medicine

## 2011-10-27 ENCOUNTER — Encounter: Payer: Self-pay | Admitting: Internal Medicine

## 2011-10-27 ENCOUNTER — Ambulatory Visit (INDEPENDENT_AMBULATORY_CARE_PROVIDER_SITE_OTHER): Payer: BC Managed Care – PPO | Admitting: Internal Medicine

## 2011-10-27 DIAGNOSIS — G8929 Other chronic pain: Secondary | ICD-10-CM

## 2011-10-27 DIAGNOSIS — E785 Hyperlipidemia, unspecified: Secondary | ICD-10-CM

## 2011-10-27 DIAGNOSIS — M549 Dorsalgia, unspecified: Secondary | ICD-10-CM

## 2011-10-27 DIAGNOSIS — E049 Nontoxic goiter, unspecified: Secondary | ICD-10-CM

## 2011-10-27 NOTE — Progress Notes (Signed)
  Subjective:    Patient ID: [name] [name], female    DOB: Feb 25, 1961, 51 y.o.   MRN: [id number]  HPI Pt presents to clinic for followup of multiple medical problems. States is seeing pain clinic for chronic back pain and advised needed mri. States wishes to have mri ordered through this office. Notes pain intermittently radiates to bilateral calves with associated feet numbness. No weakness. Reviewed mildly elevated calcium without mental status change. Pt recalls h/o of possible goiter in the past. No recent imaging. No other complaints.  Past Medical History  Diagnosis Date  . Ovarian cyst, bilateral   . Chronic back pain   . Morbid obesity   . Hypertension   . Anxiety and depression     chronic  . History of fibromyalgia     suspected   . Insomnia    No past surgical history on file.  reports that she has quit smoking. She has never used smokeless tobacco. She reports that she drinks alcohol. She reports that she uses illicit drugs (Oxycodone). family history is not on file. Allergies  Allergen Reactions  . Cymbalta (Duloxetine Hcl) Other (See Comments)    Headaches      Review of Systems see hpi     Objective:   Physical Exam  Nursing note and vitals reviewed. Constitutional: She appears well-developed and well-nourished.  HENT:  Head: Normocephalic and atraumatic.  Musculoskeletal:       5/5 bilateral le strength. Gait nl.          Assessment & Plan:

## 2011-10-28 ENCOUNTER — Other Ambulatory Visit (HOSPITAL_BASED_OUTPATIENT_CLINIC_OR_DEPARTMENT_OTHER): Payer: BC Managed Care – PPO

## 2011-10-28 LAB — BASIC METABOLIC PANEL
BUN: 11 mg/dL (ref 6–23)
CO2: 28 mEq/L (ref 19–32)
Calcium: 11 mg/dL — ABNORMAL HIGH (ref 8.4–10.5)
Chloride: 102 mEq/L (ref 96–112)
Creat: 0.5 mg/dL (ref 0.50–1.10)
Glucose, Bld: 107 mg/dL — ABNORMAL HIGH (ref 70–99)
Potassium: 5 mEq/L (ref 3.5–5.3)
Sodium: 141 mEq/L (ref 135–145)

## 2011-10-28 LAB — PTH, INTACT AND CALCIUM: PTH: 69.7 pg/mL (ref 14.0–72.0)

## 2011-10-28 LAB — LIPID PANEL
Cholesterol: 254 mg/dL — ABNORMAL HIGH (ref 0–200)
HDL: 55 mg/dL (ref >39)
Triglycerides: 283 mg/dL — ABNORMAL HIGH (ref <150)

## 2011-10-29 ENCOUNTER — Ambulatory Visit (HOSPITAL_BASED_OUTPATIENT_CLINIC_OR_DEPARTMENT_OTHER)
Admission: RE | Admit: 2011-10-29 | Discharge: 2011-10-29 | Disposition: A | Discharge status: Home / Self Care | Payer: BC Managed Care – PPO | Source: Ambulatory Visit | Attending: Internal Medicine | Admitting: Internal Medicine

## 2011-10-29 ENCOUNTER — Telehealth: Payer: Self-pay | Admitting: Internal Medicine

## 2011-10-29 DIAGNOSIS — E041 Nontoxic single thyroid nodule: Secondary | ICD-10-CM

## 2011-10-29 DIAGNOSIS — E049 Nontoxic goiter, unspecified: Secondary | ICD-10-CM | POA: Insufficient documentation

## 2011-10-29 NOTE — Telephone Encounter (Signed)
Patient returned phone call. Best# [phone]

## 2011-10-29 NOTE — Telephone Encounter (Signed)
Call placed to patient at [phone], female stated patient was not available. Message was left for patient to return phone call.

## 2011-10-29 NOTE — Telephone Encounter (Signed)
PEER TO PEER REVIEW OF MRI  [facility] 2

## 2011-10-29 NOTE — Telephone Encounter (Signed)
pls inform pt to try and obtain mri through her specialist who recommended it. My order was flagged for review and i feel confident would be denied

## 2011-10-29 NOTE — Telephone Encounter (Signed)
Call placed to patient at [phone], no answer. A detailed voice message was left informing patient per Dr Rodena Medin instructions. She was advised to follow up with Neurology to proceed with MRI.

## 2011-10-31 ENCOUNTER — Telehealth: Payer: Self-pay | Admitting: *Deleted

## 2011-10-31 ENCOUNTER — Other Ambulatory Visit: Payer: Self-pay | Admitting: Internal Medicine

## 2011-10-31 DIAGNOSIS — G8929 Other chronic pain: Secondary | ICD-10-CM

## 2011-10-31 NOTE — Telephone Encounter (Signed)
Patient called requesting lab results from her last office visit. She is also requesting a referral to [facility] Neurology Dr Oren Beckmann [phone] for her back issues.  She was reminded that she informed Dr Rodena Medin that she was advised by "her" neurologist to have a MRI done. Patient states that she does not have a neurologist; and the mri was advised by pain management.

## 2011-11-03 ENCOUNTER — Telehealth: Payer: Self-pay | Admitting: Internal Medicine

## 2011-11-03 NOTE — Telephone Encounter (Signed)
Last refill was 08/08/11 #90. Please advise regarding # of refills.

## 2011-11-03 NOTE — Telephone Encounter (Signed)
ALPRAZOLAM 1MG  QTY 90 SHE IS CHANGING DR[facility] STOR[facility] [address]  [phone]

## 2011-11-04 MED ORDER — ALPRAZOLAM 1 MG PO TABS: 1.0000 mg | ORAL_TABLET | Freq: Three times a day (TID) | ORAL | Status: DC | PRN

## 2011-11-04 NOTE — Telephone Encounter (Signed)
Refill left on pharmacy voicemail. 

## 2011-11-04 NOTE — Telephone Encounter (Signed)
#  90  rf 1 

## 2011-11-08 DIAGNOSIS — E049 Nontoxic goiter, unspecified: Secondary | ICD-10-CM | POA: Insufficient documentation

## 2011-11-08 NOTE — Assessment & Plan Note (Signed)
Schedule ls mri as recommended by pain clinic.

## 2011-11-08 NOTE — Assessment & Plan Note (Signed)
Recommend wt loss

## 2011-11-08 NOTE — Assessment & Plan Note (Signed)
Obtain chem7, pth

## 2011-11-08 NOTE — Assessment & Plan Note (Signed)
Schedule thyroid us. Obtain tsh 

## 2011-11-21 ENCOUNTER — Encounter: Payer: Self-pay | Admitting: *Deleted

## 2011-12-02 ENCOUNTER — Other Ambulatory Visit: Payer: Self-pay | Admitting: *Deleted

## 2011-12-02 NOTE — Telephone Encounter (Signed)
Alprazolam 1 mg tid prn.

## 2011-12-03 NOTE — Telephone Encounter (Signed)
?  has 1 rf remaining

## 2011-12-03 NOTE — Telephone Encounter (Signed)
[facility] at [phone], spoke with pharmacist Adela Glimpse. He was informed patient should have one refill remaining on Rx that was approved in February. He stated there were no refills on file for patient. He was informed one time refill authorized.

## 2011-12-05 ENCOUNTER — Other Ambulatory Visit: Payer: Self-pay | Admitting: *Deleted

## 2011-12-05 MED ORDER — ZOLPIDEM TARTRATE 10 MG PO TABS: 10.0000 mg | ORAL_TABLET | Freq: Every evening | ORAL | Status: DC | PRN

## 2011-12-05 NOTE — Telephone Encounter (Signed)
Patient called and left voice message requesting a refill on Ambien to pharmacy on file.  [facility] 2 refills.  Call placed to patient at [phone], no answer. A voice message was left informing patient Rx sent to pharmacy.

## 2011-12-08 ENCOUNTER — Ambulatory Visit: Payer: BC Managed Care – PPO | Admitting: Internal Medicine

## 2011-12-27 ENCOUNTER — Other Ambulatory Visit: Payer: Self-pay | Admitting: Family Medicine

## 2011-12-29 NOTE — Telephone Encounter (Signed)
Rx refill sent to pharmacy. 

## 2011-12-29 NOTE — Telephone Encounter (Signed)
ok 

## 2012-01-01 ENCOUNTER — Other Ambulatory Visit: Payer: Self-pay | Admitting: *Deleted

## 2012-01-01 NOTE — Telephone Encounter (Signed)
Call placed to patient at [phone], patient states she was having problems with [facility]. Patient states that she missed appointment, and was advised that she was unable to obtain refills until her office visit on 01/12/2012. She states that she has received her refills from them after going back and forth with the pain clinic. She stated that she is not happy with the current pain management facility and will discuss with Dr Rodena Medin at her follow up appointment about options on switching to somewhere else. Patient concerns was addressed by pain clinic. No further action required for this message.

## 2012-01-01 NOTE — Telephone Encounter (Signed)
Patient called and left voice message stating she missed her appointment with pain management, and there is no one available to see her. She would like to know if Dr Rodena Medin would provide a refill on her pain medication, and refer her to a different pain management clinic.

## 2012-01-12 ENCOUNTER — Telehealth: Payer: Self-pay | Admitting: Internal Medicine

## 2012-01-12 MED ORDER — ALPRAZOLAM 1 MG PO TABS: 1.0000 mg | ORAL_TABLET | Freq: Three times a day (TID) | ORAL | Status: DC | PRN

## 2012-01-12 NOTE — Telephone Encounter (Signed)
Alprazolam 1mg  tablet. Take one tablet by mouth 3 times a day as needed.

## 2012-01-12 NOTE — Telephone Encounter (Signed)
Per Dr Rodena Medin okay;Verbal refill left on pharmacy voice mail provider line.

## 2012-01-12 NOTE — Telephone Encounter (Signed)
#  90  rf 1 

## 2012-01-24 ENCOUNTER — Other Ambulatory Visit: Payer: Self-pay | Admitting: Internal Medicine

## 2012-01-24 NOTE — Telephone Encounter (Signed)
Triamterene-Hct 37.5-25mg  refill was sent to pharmacy then cancelled as strength did not match dose on file.  Left message for pt to return my call on Monday to verify current dose.

## 2012-01-26 ENCOUNTER — Ambulatory Visit: Payer: BC Managed Care – PPO | Admitting: Internal Medicine

## 2012-01-26 MED ORDER — TRIAMTERENE-HCTZ 37.5-25 MG PO CAPS: 1.0000 | ORAL_CAPSULE | ORAL | Status: DC

## 2012-01-26 NOTE — Telephone Encounter (Signed)
Patient states that her current dose is 37.5-25mg  capsule. The best # to reach her at is [phone], if you need to return her call

## 2012-01-26 NOTE — Telephone Encounter (Signed)
Spoke to pt, she states she has been taking 37.5-25mg  strength of Triamterene-HCT since December. States that she does not take it every day, only as needed. Sometimes takes 2 a day and sometimes does not need to take any. Refill sent to pharmacy #30 x 1 refill.  Pt wanted to know what Dr Rodena Medin wanted her to do about her elevated calcium level. Reviewed most recent result and recommendations with pt. Pt stated she would like to proceed with endocrine referral and would like to see someone within our network if possible.  Please advise.

## 2012-01-27 ENCOUNTER — Other Ambulatory Visit: Payer: Self-pay | Admitting: Internal Medicine

## 2012-01-27 NOTE — Telephone Encounter (Signed)
Referral order placed.

## 2012-02-02 ENCOUNTER — Ambulatory Visit: Payer: BC Managed Care – PPO | Admitting: Family

## 2012-02-02 ENCOUNTER — Other Ambulatory Visit: Payer: Self-pay | Admitting: Internal Medicine

## 2012-02-02 ENCOUNTER — Telehealth: Payer: Self-pay | Admitting: Internal Medicine

## 2012-02-02 DIAGNOSIS — Z1211 Encounter for screening for malignant neoplasm of colon: Secondary | ICD-10-CM

## 2012-02-02 NOTE — Telephone Encounter (Signed)
Referral order placed.

## 2012-02-02 NOTE — Telephone Encounter (Signed)
Patient would like to be scheduled for colonoscopy   , due to age and family  History

## 2012-02-03 ENCOUNTER — Encounter: Payer: Self-pay | Admitting: Gastroenterology

## 2012-02-24 ENCOUNTER — Telehealth: Payer: Self-pay | Admitting: Internal Medicine

## 2012-02-25 NOTE — Telephone Encounter (Signed)
Is it okay to provide refills for patient? 

## 2012-02-26 MED ORDER — CYCLOBENZAPRINE HCL 10 MG PO TABS: 10.0000 mg | ORAL_TABLET | Freq: Three times a day (TID) | ORAL | Status: DC | PRN

## 2012-02-26 NOTE — Telephone Encounter (Signed)
Ok rf3 

## 2012-02-26 NOTE — Telephone Encounter (Signed)
Rx refill sent to pharmacy. 

## 2012-03-08 ENCOUNTER — Other Ambulatory Visit: Payer: Self-pay | Admitting: *Deleted

## 2012-03-08 MED ORDER — ZOLPIDEM TARTRATE 10 MG PO TABS: 10.0000 mg | ORAL_TABLET | Freq: Every evening | ORAL | Status: DC | PRN

## 2012-03-08 NOTE — Telephone Encounter (Signed)
Is it okay to provide refills on Ambien for patient . There are no future appointments on file for this patient.

## 2012-03-08 NOTE — Telephone Encounter (Signed)
#  30 rf1. Pt cancelled last appt. Needs to schedule a 30 min follow up in a few months

## 2012-03-08 NOTE — Telephone Encounter (Signed)
Verbal refill provided to pharmacist Adela Glimpse. Call placed to patient at [phone], no answer. A detailed voice message was left informing patient to call back to schedule 30 minutes follow up appointment with Dr Rodena Medin for September.

## 2012-03-24 ENCOUNTER — Other Ambulatory Visit: Payer: Self-pay | Admitting: Internal Medicine

## 2012-03-24 NOTE — Telephone Encounter (Signed)
Rx refill sent to pharmacy. 

## 2012-04-13 ENCOUNTER — Encounter: Payer: BC Managed Care – PPO | Admitting: Gastroenterology

## 2012-04-19 ENCOUNTER — Other Ambulatory Visit: Payer: Self-pay | Admitting: Internal Medicine

## 2012-04-19 NOTE — Telephone Encounter (Signed)
Rx Done/SLS 

## 2012-05-20 ENCOUNTER — Telehealth: Payer: Self-pay | Admitting: Internal Medicine

## 2012-05-20 MED ORDER — ALPRAZOLAM 1 MG PO TABS: 1.0000 mg | ORAL_TABLET | Freq: Three times a day (TID) | ORAL | Status: DC | PRN

## 2012-05-20 NOTE — Telephone Encounter (Signed)
Refill-alprazolam 1mg  tablet. Take one tablet by mouth three times a day as needed.

## 2012-05-20 NOTE — Telephone Encounter (Signed)
Ok #90 rf1 

## 2012-05-20 NOTE — Telephone Encounter (Signed)
Is this ok to refill?.. 05/20/12@11 :55am/LMB

## 2012-05-20 NOTE — Telephone Encounter (Signed)
Rx call into pharmacy spoke with [place]/pharmacist. Epic updated... 05/20/12@1 :23pm/lmb

## 2012-06-07 ENCOUNTER — Ambulatory Visit (INDEPENDENT_AMBULATORY_CARE_PROVIDER_SITE_OTHER): Payer: BC Managed Care – PPO | Admitting: Internal Medicine

## 2012-06-07 ENCOUNTER — Encounter: Payer: Self-pay | Admitting: Internal Medicine

## 2012-06-07 VITALS — BP 116/72 | HR 79 | Temp 98.4°F | Resp 16 | Wt 249.2 lb

## 2012-06-07 DIAGNOSIS — I10 Essential (primary) hypertension: Secondary | ICD-10-CM

## 2012-06-07 DIAGNOSIS — E039 Hypothyroidism, unspecified: Secondary | ICD-10-CM

## 2012-06-07 DIAGNOSIS — Z79899 Other long term (current) drug therapy: Secondary | ICD-10-CM

## 2012-06-07 DIAGNOSIS — E785 Hyperlipidemia, unspecified: Secondary | ICD-10-CM

## 2012-06-07 DIAGNOSIS — G4700 Insomnia, unspecified: Secondary | ICD-10-CM

## 2012-06-07 MED ORDER — PHENTERMINE HCL 37.5 MG PO CAPS: 37.5000 mg | ORAL_CAPSULE | ORAL | Status: DC

## 2012-06-07 NOTE — Progress Notes (Signed)
  Subjective:    Patient ID: [name] [name], female    DOB: March 29, 1961, 51 y.o.   MRN: [id number]  HPI Pt presents to clinic for followup of multiple medical problems.  Notes weight gain with amitriptyline. Blood pressure reviewed as normotensive. Currently seeing neurology at [facility]. Also followed by the pain clinic. Has chronic right foot pain status post metatarsal fracture. Total time of visit approximately twenty-six minutes of which greater than 50% of time was spent in counseling.  Past Medical History  Diagnosis Date  . Ovarian cyst, bilateral   . Chronic back pain   . Morbid obesity   . Hypertension   . Anxiety and depression     chronic  . History of fibromyalgia     suspected   . Insomnia    No past surgical history on file.  reports that she has quit smoking. She has never used smokeless tobacco. She reports that she drinks alcohol. She reports that she uses illicit drugs (Oxycodone). family history is not on file. Allergies  Allergen Reactions  . Cymbalta (Duloxetine Hcl) Other (See Comments)    Headaches      Review of Systems see hpi     Objective:   Physical Exam  Nursing note and vitals reviewed. Constitutional: She appears well-developed and well-nourished. No distress.  HENT:  Head: Normocephalic and atraumatic.  Eyes: Conjunctivae normal are normal. No scleral icterus.  Pulmonary/Chest: No respiratory distress. She has no wheezes. She has no rales.  Neurological: She is alert.  Skin: Skin is warm and dry. She is not diaphoretic.  Psychiatric: She has a normal mood and affect.          Assessment & Plan:

## 2012-06-08 LAB — CBC WITH DIFFERENTIAL/PLATELET
Basophils Absolute: 0 10*3/uL (ref 0.0–0.1)
Basophils Relative: 0 % (ref 0–1)
Eosinophils Relative: 3 % (ref 0–5)
HCT: 40.9 % (ref 36.0–46.0)
MCHC: 33.3 g/dL (ref 30.0–36.0)
MCV: 93.6 fL (ref 78.0–100.0)
Monocytes Absolute: 0.5 10*3/uL (ref 0.1–1.0)
Neutro Abs: 4.4 10*3/uL (ref 1.7–7.7)
Platelets: 224 10*3/uL (ref 150–400)
RDW: 13.8 % (ref 11.5–15.5)
WBC: 7.2 10*3/uL (ref 4.0–10.5)

## 2012-06-08 LAB — LIPID PANEL
Cholesterol: 223 mg/dL — ABNORMAL HIGH (ref 0–200)
HDL: 49 mg/dL (ref >39)
Triglycerides: 432 mg/dL — ABNORMAL HIGH (ref <150)

## 2012-06-08 LAB — HEPATIC FUNCTION PANEL
ALT: 22 U/L (ref 0–35)
AST: 22 U/L (ref 0–37)
Alkaline Phosphatase: 78 U/L (ref 39–117)
Indirect Bilirubin: 0.3 mg/dL (ref 0.0–0.9)
Total Protein: 6.7 g/dL (ref 6.0–8.3)

## 2012-06-08 LAB — BASIC METABOLIC PANEL
BUN: 9 mg/dL (ref 6–23)
Calcium: 11 mg/dL — ABNORMAL HIGH (ref 8.4–10.5)
Creat: 0.58 mg/dL (ref 0.50–1.10)

## 2012-06-13 ENCOUNTER — Other Ambulatory Visit: Payer: Self-pay | Admitting: Internal Medicine

## 2012-06-14 NOTE — Telephone Encounter (Signed)
DENIED--too early for refill; last Rx 05.30.13 #90x3-no refill prior to 09.30.13/SLS

## 2012-06-14 NOTE — Telephone Encounter (Signed)
No refill prior to 9/30.

## 2012-06-14 NOTE — Telephone Encounter (Signed)
Request for Cyclobenzaprine 10 mg Sig: [1] tablet up to TID PRN muscle spasms. Last Rx #90x3 05.30.13; verified with pharmacy all refills had been used [as needed medication] and pharmacist verified all had been dispensed [should still be end of September prescription]/SLS Please advise.

## 2012-06-15 ENCOUNTER — Other Ambulatory Visit: Payer: Self-pay | Admitting: *Deleted

## 2012-06-15 NOTE — Telephone Encounter (Signed)
Received call from pt stating she was seen in the office last week because she needed refills on all medications. Reports that the pharmacy has not received any refills from [place] and pt needs everything ASAP. States they did not receive new rx for weight loss either.  Please advise.

## 2012-06-16 ENCOUNTER — Telehealth: Payer: Self-pay | Admitting: Internal Medicine

## 2012-06-16 MED ORDER — CYCLOBENZAPRINE HCL 10 MG PO TABS: 10.0000 mg | ORAL_TABLET | Freq: Three times a day (TID) | ORAL | Status: DC | PRN

## 2012-06-16 MED ORDER — ZOLPIDEM TARTRATE 10 MG PO TABS: 10.0000 mg | ORAL_TABLET | Freq: Every evening | ORAL | Status: DC | PRN

## 2012-06-16 MED ORDER — ETODOLAC 400 MG PO TABS: 400.0000 mg | ORAL_TABLET | Freq: Two times a day (BID) | ORAL | Status: DC

## 2012-06-16 NOTE — Telephone Encounter (Signed)
Refill-zolpidem tartrate 10mg  tablet. Take one tablet by mouth at bedtime as needed for sleep. Last fill 8.22.13

## 2012-06-16 NOTE — Telephone Encounter (Signed)
Called [facility]ill of ambien and [facility]ap[facility] [place] [facility] Verified that pt has refills on file at the pharmacy for alprazolam, metoprolol and lisinopril. Notified pt and she confirmed that Oxycodone comes from pain management, still takes etodolac and reports that she found her phentermine rx. [facility]ac [facility]

## 2012-06-16 NOTE — Telephone Encounter (Signed)
Phentermine was printed at her visit. OK to send ambien #30 no refills. Alprazolam should have a refill on file. Oxycodone is being filled by Landry Corporal NP- no refill.  OK to send 90 tabs flexeril no refills.  Ok to send 1 month supply with 2 refills on Etodolac (is she still using?) , metoprolol lisinopril.

## 2012-06-17 NOTE — Assessment & Plan Note (Signed)
Obtain TSH °

## 2012-06-17 NOTE — Assessment & Plan Note (Signed)
Attempt short-term phentermine. Instructed to monitor blood pressure and heart rate and sketch close followup

## 2012-06-17 NOTE — Assessment & Plan Note (Signed)
Obtain lipid profile and liver function tests

## 2012-06-17 NOTE — Assessment & Plan Note (Signed)
Attempt to decrease Ambien 5 mg

## 2012-06-17 NOTE — Assessment & Plan Note (Signed)
Normotensive and stable. Continue current regimen. Monitor bp as outpt and followup in clinic as scheduled. Obtain CBC and Chem-7

## 2012-06-28 ENCOUNTER — Other Ambulatory Visit: Payer: Self-pay | Admitting: *Deleted

## 2012-06-28 DIAGNOSIS — E785 Hyperlipidemia, unspecified: Secondary | ICD-10-CM

## 2012-06-28 NOTE — Telephone Encounter (Signed)
Message copied by Regis Bill on Mon Jun 28, 2012  9:01 AM ------      Message from: Edwyna Perfect      Created: Mon Jun 21, 2012  9:17 PM       TG high. Low fat diet and exercise. Recommend fenofibrate 134mg  qd. Recheck lipid/lft 272.4 in one month

## 2012-06-28 NOTE — Telephone Encounter (Signed)
LMOM with contact name & number for return call RE: results & further MD instructions/recommendations/SLS New Rx & future lab orders pending.

## 2012-06-29 MED ORDER — FENOFIBRATE MICRONIZED 134 MG PO CAPS: 134.0000 mg | ORAL_CAPSULE | Freq: Every day | ORAL | Status: DC

## 2012-06-29 NOTE — Telephone Encounter (Signed)
Pt informed, understood & agreed; new Rx order to pharmacy, future lab orders placed/SLS

## 2012-07-15 ENCOUNTER — Other Ambulatory Visit: Payer: Self-pay | Admitting: Family

## 2012-07-15 ENCOUNTER — Other Ambulatory Visit: Payer: Self-pay | Admitting: Internal Medicine

## 2012-07-16 ENCOUNTER — Telehealth: Payer: Self-pay | Admitting: Internal Medicine

## 2012-07-16 NOTE — Telephone Encounter (Signed)
Refill cyclobenzaprine 10 mg tablet take 1 by mouth 3 times a day as needed qty 90 last fill not shown

## 2012-07-16 NOTE — Telephone Encounter (Signed)
Rx printed; [facility] [address]

## 2012-07-19 ENCOUNTER — Ambulatory Visit: Payer: BC Managed Care – PPO | Admitting: Internal Medicine

## 2012-08-02 ENCOUNTER — Ambulatory Visit: Payer: BC Managed Care – PPO | Admitting: Internal Medicine

## 2012-08-02 ENCOUNTER — Telehealth: Payer: Self-pay | Admitting: *Deleted

## 2012-08-02 NOTE — Telephone Encounter (Signed)
Office Message [address] [place], [place] [address] p. [phone] f. [phone] To: Lorrin Mais Fax: [phone] From: [facility] Date/ Time: 08/01/2012 6:59 PM Taken By: Forbes Cellar, CSR Caller: Elois Facility: not collected Patient: [name] [name], [name] [name] DOB: [birth year] Phone: [phone] Reason for Call: See info below Regarding Appointment: Yes Appt Date: 08/02/2012 Appt Time: 13;45 Provider: Marguarite Arbour (Adults only) Reason: Cancel Appointment Details: schedule conflict Outcome: Cancelled appointment in EPIC [facility])

## 2012-08-10 ENCOUNTER — Other Ambulatory Visit: Payer: Self-pay | Admitting: Internal Medicine

## 2012-08-11 NOTE — Telephone Encounter (Signed)
Rx to pharmacy/SLS 

## 2012-08-12 ENCOUNTER — Other Ambulatory Visit: Payer: Self-pay | Admitting: Family

## 2012-08-13 NOTE — Telephone Encounter (Signed)
Cyclobenzaprine request [Last Rx 10.17.13 #90x0]/SLS Please advise.

## 2012-08-13 NOTE — Telephone Encounter (Signed)
For her to need a refill she would have taken 3 a day every single day. deny

## 2012-08-18 ENCOUNTER — Other Ambulatory Visit: Payer: Self-pay | Admitting: Family

## 2012-08-18 NOTE — Telephone Encounter (Signed)
[name] Cotta, MD 08/13/2012 3:10 PM Signed  For her to need a refill she would have taken 3 a day every single day. deny Meghan Tiemann, CMA 08/13/2012 9:25 AM Signed  Cyclobenzaprine request [Last Rx 10.17.13 #90x0]/SLS  Please advise.  Denied-AGAIN/SLS

## 2012-08-19 ENCOUNTER — Telehealth: Payer: Self-pay | Admitting: *Deleted

## 2012-08-19 ENCOUNTER — Other Ambulatory Visit: Payer: Self-pay | Admitting: *Deleted

## 2012-08-19 MED ORDER — CYCLOBENZAPRINE HCL 10 MG PO TABS: ORAL_TABLET | ORAL | Status: DC

## 2012-08-19 NOTE — Telephone Encounter (Signed)
Pt called stating that a new Rx is needed every month from her pharmacy for her cyclobenzaprine & "other providers would give her more than one month supply at a time". Pt went on to say that this issue "has to be resolved & she is getting really upset about this and she is not going to come in for provider to look her over to say [facility], pay the bill", she states that she knows this is something that she should have discussed at her last OV & she really likes Dr. Rodena Medin but she "might have to start shopping around for a new PCP". Pt states that she is "tired of having to call & have the pharmacy call for refill [cyclobenzaprine]". Pt states that she was in office last month for OV & medication refills: [last OV 09.09.13 & was due for follow-up in one month, canceled 10.21.13 & 11.04.13 appts; patient has pattern of receiving medication & then canceling OV]; pt c/o having trouble getting cyclobenzaprine refill [medication refilled 10.18.13 #90: denied 11.14.13 d/t request too soon, as Rx is to be taken AS NEEDED], pt reports that original provider that prescribed medication gave dosing instructions to be taken with pain medication-every 6 hours as needed, not more than 4 per day; explained to pt that Dr. Ilda Foil instructions are to take medication up to 3 times daily AS NEEDED, not as her pain medication [Oxycodone] is prescribed, and that her request for refill w/i 30 days of her last #90 refill given meant that she would have to be taking Rx 3 times daily, every day & that is not her instructions given by her provider. Pt began to tell of hardships she has been facing lately [such as MRI, pinched nerve, nerve conduction study, filing for disability, seeing numerous physicians, not having money to pay co-pays for OV]; explained to patient that it is necessary to follow provider instructions; such as keeping appointments, doing lab orders, taking medications as prescribed; as this is how her health maintenance is  followed & without being compliant to provider's instructions, not only could it have an adverse affect on her healthcare; but could also cause provider to be liable of non-compliance with healthcare regulations. Patient informed that without follow-up office visit that she would be unable to receive refill of medication and that if she did not keep appointment, no further refill authorizations would be given. Pt informed that she is to take medication as prescribed and will only be given a minimal amount on refill per provider and this will continue to be provider policy; and we understand if she feels she needs to seek a new PCP that we will be glad to forward her medical records. Pt no longer wishes to change provider & understands her responsibility as the patient to be compliant with provider instructions; scheduled follow-up OV, Tues. 11.26.13 at 3:00pm; provider informed/SLS

## 2012-08-24 ENCOUNTER — Ambulatory Visit: Payer: BC Managed Care – PPO | Admitting: Internal Medicine

## 2012-10-11 ENCOUNTER — Other Ambulatory Visit: Payer: Self-pay | Admitting: Internal Medicine

## 2012-10-11 NOTE — Telephone Encounter (Signed)
Rx to pharmacy/SLS 

## 2012-11-16 ENCOUNTER — Ambulatory Visit: Payer: BC Managed Care – PPO | Admitting: Family

## 2012-11-18 ENCOUNTER — Ambulatory Visit: Payer: BC Managed Care – PPO | Admitting: Family Medicine

## 2012-12-17 ENCOUNTER — Other Ambulatory Visit: Payer: Self-pay | Admitting: Internal Medicine

## 2012-12-17 NOTE — Telephone Encounter (Signed)
Cyclobenzaprine request DENIED--Patient should contact provider about appointment/SLS  Please see 11.21.13 phone note where pt was given detailed instructions about this matter and told that [1] provider did not want her taking medication TID QD, only As Needed [2] it is necessary to follow provider instructions; such as keeping appointments, doing lab orders, taking medications as prescribed and [3] without follow-up office visit that she would be unable to receive refill of medication. Patient stated she understands her responsibility as the patient to be compliant with provider instructions; scheduled follow-up OV, Tues. 11.26.13 at 3:00pm/SLS  Patient canceled follow-up appointment given & all future appointments scheduled/SLS     

## 2012-12-17 NOTE — Telephone Encounter (Signed)
Agree 

## 2012-12-24 ENCOUNTER — Other Ambulatory Visit: Payer: Self-pay | Admitting: Internal Medicine

## 2012-12-27 ENCOUNTER — Telehealth: Payer: Self-pay | Admitting: *Deleted

## 2012-12-27 NOTE — Telephone Encounter (Signed)
LMOM with contact name and number for return call RE: medication refill [denial cyclobenzaprine]; pt instructed [11.21.13 phone note] about importance of following provider instructions on taking medications & her responsibility as the patient to be compliant with provider instructions on office visits [canceled last [5] with different providers]. Pt informed must have OV prior to Rx refill authorization/SLS

## 2012-12-27 NOTE — Telephone Encounter (Signed)
Cyclobenzaprine request DENIED--Patient should contact provider about appointment/SLS  Please see 11.21.13 phone note where pt was given detailed instructions about this matter and told that [1] provider did not want her taking medication TID QD, only As Needed [2] it is necessary to follow provider instructions; such as keeping appointments, doing lab orders, taking medications as prescribed and [3] without follow-up office visit that she would be unable to receive refill of medication. Patient stated she understands her responsibility as the patient to be compliant with provider instructions; scheduled follow-up OV, Tues. 11.26.13 at 3:00pm/SLS  Patient canceled follow-up appointment given & all future appointments scheduled/SLS

## 2013-01-04 ENCOUNTER — Encounter: Payer: Self-pay | Admitting: Family Medicine

## 2013-01-04 ENCOUNTER — Other Ambulatory Visit: Payer: Self-pay | Admitting: Family Medicine

## 2013-01-04 ENCOUNTER — Ambulatory Visit (INDEPENDENT_AMBULATORY_CARE_PROVIDER_SITE_OTHER): Payer: BC Managed Care – PPO | Admitting: Family Medicine

## 2013-01-04 VITALS — BP 118/84 | HR 83 | Temp 98.2°F | Ht 65.5 in | Wt 243.0 lb

## 2013-01-04 DIAGNOSIS — I10 Essential (primary) hypertension: Secondary | ICD-10-CM

## 2013-01-04 DIAGNOSIS — R7309 Other abnormal glucose: Secondary | ICD-10-CM

## 2013-01-04 DIAGNOSIS — G4700 Insomnia, unspecified: Secondary | ICD-10-CM

## 2013-01-04 DIAGNOSIS — E785 Hyperlipidemia, unspecified: Secondary | ICD-10-CM

## 2013-01-04 LAB — HEPATIC FUNCTION PANEL
ALT: 31 U/L (ref 0–35)
AST: 20 U/L (ref 0–37)
Albumin: 4.4 g/dL (ref 3.5–5.2)
Bilirubin, Direct: 0.1 mg/dL (ref 0.0–0.3)
Total Bilirubin: 0.3 mg/dL (ref 0.3–1.2)

## 2013-01-04 LAB — LIPID PANEL
Cholesterol: 195 mg/dL (ref 0–200)
HDL: 60 mg/dL (ref >39)
LDL Cholesterol: 107 mg/dL — ABNORMAL HIGH (ref 0–99)
Triglycerides: 141 mg/dL (ref <150)
VLDL: 28 mg/dL (ref 0–40)

## 2013-01-04 LAB — CBC
Hemoglobin: 13.4 g/dL (ref 12.0–15.0)
MCH: 31.4 pg (ref 26.0–34.0)
MCHC: 33.9 g/dL (ref 30.0–36.0)
RDW: 13.5 % (ref 11.5–15.5)

## 2013-01-04 MED ORDER — ZOLPIDEM TARTRATE 10 MG PO TABS: 10.0000 mg | ORAL_TABLET | Freq: Every evening | ORAL | Status: DC | PRN

## 2013-01-04 MED ORDER — PHENTERMINE HCL 37.5 MG PO CAPS: 37.5000 mg | ORAL_CAPSULE | ORAL | Status: DC

## 2013-01-04 MED ORDER — ALPRAZOLAM 1 MG PO TABS: 1.0000 mg | ORAL_TABLET | Freq: Three times a day (TID) | ORAL | Status: DC | PRN

## 2013-01-04 NOTE — Patient Instructions (Addendum)

## 2013-01-04 NOTE — Assessment & Plan Note (Signed)
Minimize simple carbs 

## 2013-01-04 NOTE — Progress Notes (Signed)
Patient ID: TODD ARGABRIGHT, female   DOB: 05-Jun-1961, 52 y.o.   MRN: [id number] ADYSSON REVELLE [id number] 1960-12-11 01/04/2013      Progress Note-Follow Up  Subjective  Chief Complaint  Chief Complaint  Patient presents with  . Medication Refill    Xanax    HPI  Is a 52 year old Caucasian female who is in today for followup. She continues to struggle with pain management for back pain. No recent illness. She has generally been in good health recently. No fevers or chills. No chest pain or palpitations. No shortness or breath GI or GU complaints noted today. She has been trying to lose weight and eating a significantly better diet. Increased fresh fruits and vegetables and decreased her carbohydrate intake  Past Medical History  Diagnosis Date  . Ovarian cyst, bilateral   . Chronic back pain   . Morbid obesity   . Hypertension   . Anxiety and depression     chronic  . History of fibromyalgia     suspected   . Insomnia     No past surgical history on file.  No family history on file.  History   Social History  . Marital Status: Divorced    Spouse Name: N/A    Number of Children: N/A  . Years of Education: N/A   Occupational History  . Not on file.   Social History Main Topics  . Smoking status: Former Games developer  . Smokeless tobacco: Never Used  . Alcohol Use: Yes     Comment: occassionally  . Drug Use: Yes    Special: Oxycodone  . Sexually Active: Not on file   Other Topics Concern  . Not on file   Social History Narrative  . No narrative on file    Current Outpatient Prescriptions on File Prior to Visit  Medication Sig Dispense Refill  . cyclobenzaprine (FLEXERIL) 10 MG tablet TAKE 1 TABLET BY MOUTH 3 TIMES A DAY AS NEEDED  90 tablet  3  . lisinopril (PRINIVIL,ZESTRIL) 10 MG tablet TAKE 1 TABLET BY MOUTH EVERY DAY  30 tablet  3  . metoprolol succinate (TOPROL-XL) 25 MG 24 hr tablet TAKE 1 TABLET BY MOUTH EVERY DAY  30 tablet  6  . Oxycodone HCl 20 MG TABS  Take 1 tablet (20 mg total) by mouth every 6 (six) hours as needed. For pain, not over 4 per day  120 each  0  . triamterene-hydrochlorothiazide (DYAZIDE) 37.5-25 MG per capsule Take 1 each (1 capsule total) by mouth every morning.  30 capsule  1   No current facility-administered medications on file prior to visit.    Allergies  Allergen Reactions  . Cymbalta (Duloxetine Hcl) Other (See Comments)    Headaches  . Fiorinal (Butalbital-Aspirin-Caffeine) Other (See Comments)    Mental Clarity.    Review of Systems  Review of Systems  Constitutional: Positive for malaise/fatigue. Negative for fever.  HENT: Negative for congestion.   Eyes: Negative for discharge.  Respiratory: Negative for shortness of breath.   Cardiovascular: Negative for chest pain, palpitations and leg swelling.  Gastrointestinal: Negative for nausea, abdominal pain and diarrhea.  Genitourinary: Negative for dysuria.  Musculoskeletal: Positive for back pain. Negative for falls.  Skin: Negative for rash.  Neurological: Negative for loss of consciousness and headaches.  Endo/Heme/Allergies: Negative for polydipsia.  Psychiatric/Behavioral: Negative for depression and suicidal ideas. The patient is not nervous/anxious and does not have insomnia.     Objective  BP 118/84  Pulse 83  Temp(Src) 98.2 F (36.8 C) (Oral)  Ht 5' 5.5" (1.664 m)  Wt 243 lb 0.6 oz (110.242 kg)  BMI 39.81 kg/m2  SpO2 94%  Physical Exam  Physical Exam  Constitutional: She is oriented to person, place, and time and well-developed, well-nourished, and in no distress. No distress.  HENT:  Head: Normocephalic and atraumatic.  Eyes: Conjunctivae are normal.  Neck: Neck supple. No thyromegaly present.  Cardiovascular: Normal rate, regular rhythm and normal heart sounds.   No murmur heard. Pulmonary/Chest: Effort normal and breath sounds normal. She has no wheezes.  Abdominal: She exhibits no distension and no mass.  Musculoskeletal:  She exhibits no edema.  Lymphadenopathy:    She has no cervical adenopathy.  Neurological: She is alert and oriented to person, place, and time.  Skin: Skin is warm and dry. No rash noted. She is not diaphoretic.  Psychiatric: Memory, affect and judgment normal.    Lab Results  Component Value Date   TSH 2.260 06/07/2012   Lab Results  Component Value Date   WBC 7.2 06/07/2012   HGB 13.6 06/07/2012   HCT 40.9 06/07/2012   MCV 93.6 06/07/2012   PLT 224 06/07/2012   Lab Results  Component Value Date   CREATININE 0.58 06/07/2012   BUN 9 06/07/2012   NA 141 06/07/2012   K 4.3 06/07/2012   CL 100 06/07/2012   CO2 34* 06/07/2012   Lab Results  Component Value Date   ALT 22 06/07/2012   AST 22 06/07/2012   ALKPHOS 78 06/07/2012   BILITOT 0.4 06/07/2012   Lab Results  Component Value Date   CHOL 223* 06/07/2012   Lab Results  Component Value Date   HDL 49 06/07/2012   Lab Results  Component Value Date   LDLCALC Comment:   Not calculated due to Triglyceride >400. Suggest ordering Direct LDL (Unit Code: 40981).   Total Cholesterol/HDL Ratio:CHD Risk                        Coronary Heart Disease Risk Table                                        Men       Women          1/2 Average Risk              3.4        3.3              Average Risk              5.0        4.4           2X Average Risk              9.6        7.1           3X Average Risk             23.4       11.0 Use the calculated Patient Ratio above and the CHD Risk table  to determine the patient's CHD Risk. ATP III Classification (LDL):       < 100        mg/dL         Optimal      191 - 129  mg/dL         Near or Above Optimal      130 - 159     mg/dL         Borderline High      160 - 189     mg/dL         High       > 213        mg/dL         Very High   0/04/6577   Lab Results  Component Value Date   TRIG 432* 06/07/2012   Lab Results  Component Value Date   CHOLHDL 4.6 06/07/2012     Assessment & Plan  ESSENTIAL HYPERTENSION Well  controlled, no changes to meds  HYPERLIPIDEMIA Repeat lipid panel today, avoid trans fats and consider a krill iol cap daily  MORBID OBESITY Has made numeorus dietary changes and is loosing weight encouraged increased exercise. Given a refill on Phentermine she will stop it if she has any concerning symptoms. Consider DASH diet.  HYPERGLYCEMIA Minimize simple carbs.

## 2013-01-04 NOTE — Assessment & Plan Note (Signed)
Well controlled, no changes to meds 

## 2013-01-04 NOTE — Assessment & Plan Note (Addendum)
Has made numeorus dietary changes and is loosing weight encouraged increased exercise. Given a refill on Phentermine she will stop it if she has any concerning symptoms. Consider DASH diet.

## 2013-01-04 NOTE — Assessment & Plan Note (Signed)
Repeat lipid panel today, avoid trans fats and consider a krill iol cap daily

## 2013-01-07 ENCOUNTER — Telehealth: Payer: Self-pay | Admitting: Family Medicine

## 2013-01-07 MED ORDER — CYCLOBENZAPRINE HCL 10 MG PO TABS: ORAL_TABLET | ORAL | Status: DC

## 2013-01-07 NOTE — Telephone Encounter (Signed)
Ok to refill cyclobenzaprine same strength, sig, #90 1 rf

## 2013-01-07 NOTE — Telephone Encounter (Signed)
Please advise? Last filled on 08/19/12 #90 with 3 refills.

## 2013-01-07 NOTE — Telephone Encounter (Signed)
Rx sent in to pharmacy. 

## 2013-01-07 NOTE — Telephone Encounter (Signed)
Refill- cyclobenzaprine 10mg  tablet. Take one tablet by mouth three times a day as needed. Qty 90 last fill 2.12.14

## 2013-01-12 ENCOUNTER — Telehealth: Payer: Self-pay | Admitting: *Deleted

## 2013-01-12 MED ORDER — PHENTERMINE HCL 37.5 MG PO TABS: 37.5000 mg | ORAL_TABLET | Freq: Every day | ORAL | Status: DC

## 2013-01-12 NOTE — Telephone Encounter (Signed)
Pt reports that when she was in office on 04.08.14 and was given Phentermine Rx in capsule form & states that she forgot to request to have prescription in Tablet form, so that she may score them if she chooses not to take full dose at any time; verified with pharmacy that Rx has not been filled by patient/SLS Please advise on requested change.

## 2013-01-12 NOTE — Telephone Encounter (Signed)
OK to change Phentermine to the tablet form just cancel her refills of the caps. Same strength, same sig.

## 2013-01-12 NOTE — Telephone Encounter (Signed)
Change made w/pharmacy via phone; [facility] with contact name and number RE: Rx request done/SLS

## 2013-02-03 ENCOUNTER — Telehealth: Payer: Self-pay

## 2013-02-03 ENCOUNTER — Other Ambulatory Visit: Payer: Self-pay | Admitting: Family Medicine

## 2013-02-03 NOTE — Telephone Encounter (Signed)
Patient left a message stating that she would like Beconase (AQ) 25 G sent to [facility] on [address]? Please advise?

## 2013-02-03 NOTE — Telephone Encounter (Signed)
So i am willing to write it but I do not see it in her chart or what she uses it for. Ask her how she uses it and we can send it in

## 2013-02-04 MED ORDER — BECLOMETHASONE DIPROP MONOHYD 42 MCG/SPRAY NA SUSP: NASAL | Status: DC

## 2013-02-04 NOTE — Telephone Encounter (Signed)
RX sent- per pt she uses this for allergies and the directions are 1 spray in each nostril daily prn

## 2013-02-08 ENCOUNTER — Telehealth: Payer: Self-pay

## 2013-02-08 NOTE — Telephone Encounter (Signed)
Pharmacy is sending over paperwork for PS on Betonase AQ- nasal spray.

## 2013-02-10 NOTE — Telephone Encounter (Signed)
Patient informed. 

## 2013-02-10 NOTE — Telephone Encounter (Signed)
Pt called again to see what is going on with this request?  I called and talked to pharmacist again (due to not receiving this) and the phone number for PA is [phone] and pts id # is  [phone]

## 2013-02-14 NOTE — Telephone Encounter (Signed)
PA initiated # [id number]

## 2013-02-14 NOTE — Telephone Encounter (Signed)
Paperwork on MD's desk waiting for signature and will be faxed in the morning.

## 2013-02-14 NOTE — Telephone Encounter (Signed)
Pt called again and was informed that we are waiting to hear from the insurance company. Pt voiced understanding

## 2013-02-15 NOTE — Telephone Encounter (Signed)
Paperwork faxed °

## 2013-02-16 MED ORDER — FLUTICASONE FUROATE 27.5 MCG/SPRAY NA SUSP: 2.0000 | Freq: Every day | NASAL | Status: DC | PRN

## 2013-02-16 NOTE — Telephone Encounter (Signed)
I haven't received the PA back yet but pt left a message stating that her insurance company mailed her a letter stating that they will pay for Fluticasone or Triamcinolone. Pt would like MD to go ahead and call one of these two medications in. Please advise?

## 2013-02-16 NOTE — Telephone Encounter (Signed)
Have her try the Fluticasone nasal, 2 sprays each nostril daily prn allergies, Dispe #1 bottle with 3 rf

## 2013-02-16 NOTE — Telephone Encounter (Signed)
RX sent and pt informed 

## 2013-02-18 ENCOUNTER — Telehealth: Payer: Self-pay | Admitting: *Deleted

## 2013-02-18 MED ORDER — FLUTICASONE PROPIONATE 50 MCG/ACT NA SUSP: 2.0000 | Freq: Every day | NASAL | Status: DC

## 2013-02-18 NOTE — Telephone Encounter (Signed)
Patient left two message on voice mail stating that her insurance would not cover for her nasal spray. Patient also stated that pharmacy was going to send a list of nasal sprays that her insurance would cover. [phone]

## 2013-02-18 NOTE — Telephone Encounter (Signed)
Will rx fluticasone

## 2013-02-18 NOTE — Telephone Encounter (Signed)
Received [facility] from [facility] that pt's insurance will cover: flunisolide, fluticasone propionate, triamcinolone acetonide, nasonex, Qnasal.  Please advise if one of these would be an appropriate substitution?

## 2013-02-18 NOTE — Telephone Encounter (Signed)
Left a message on patient's voice mail letting her know we had sent in the fluticasone.

## 2013-02-24 ENCOUNTER — Telehealth: Payer: Self-pay | Admitting: Family Medicine

## 2013-02-24 MED ORDER — TRIAMTERENE-HCTZ 37.5-25 MG PO CAPS: 1.0000 | ORAL_CAPSULE | ORAL | Status: DC

## 2013-02-24 NOTE — Telephone Encounter (Signed)
Refill- triamterene-hctz 37.5-25mg  cap. Take one each(1 capsule total) by mouth every morning. Qty 30 last fill 4.29.13

## 2013-02-24 NOTE — Telephone Encounter (Signed)
RX sent

## 2013-03-11 ENCOUNTER — Other Ambulatory Visit: Payer: Self-pay | Admitting: Family Medicine

## 2013-04-13 ENCOUNTER — Other Ambulatory Visit: Payer: Self-pay | Admitting: Internal Medicine

## 2013-04-13 ENCOUNTER — Other Ambulatory Visit: Payer: Self-pay | Admitting: Family Medicine

## 2013-04-13 NOTE — Telephone Encounter (Signed)
Will forward to Dr. Abner Greenspan since she has been receiving 90 day supply.

## 2013-04-13 NOTE — Telephone Encounter (Signed)
Please advise refill? Last RX was done on 01-04-13 quantity 90 with 1 refill  If ok fax to [phone] 

## 2013-04-13 NOTE — Telephone Encounter (Signed)
Please advise refill? Last RX was done on 01-04-13 quantity 90 with 1 refill  If ok fax to [phone]

## 2013-04-13 NOTE — Telephone Encounter (Signed)
What medicine is this request for?

## 2013-04-14 ENCOUNTER — Other Ambulatory Visit: Payer: Self-pay | Admitting: Family Medicine

## 2013-04-14 NOTE — Telephone Encounter (Signed)
Ambien is being requested.

## 2013-04-14 NOTE — Telephone Encounter (Signed)
We already did this

## 2013-05-05 ENCOUNTER — Ambulatory Visit (INDEPENDENT_AMBULATORY_CARE_PROVIDER_SITE_OTHER): Payer: BC Managed Care – PPO | Admitting: Family Medicine

## 2013-05-05 ENCOUNTER — Encounter: Payer: Self-pay | Admitting: Family Medicine

## 2013-05-05 VITALS — Ht 65.5 in

## 2013-05-05 DIAGNOSIS — G4700 Insomnia, unspecified: Secondary | ICD-10-CM

## 2013-05-05 DIAGNOSIS — R7989 Other specified abnormal findings of blood chemistry: Secondary | ICD-10-CM

## 2013-05-05 DIAGNOSIS — R945 Abnormal results of liver function studies: Secondary | ICD-10-CM

## 2013-05-05 DIAGNOSIS — I10 Essential (primary) hypertension: Secondary | ICD-10-CM

## 2013-05-05 DIAGNOSIS — E049 Nontoxic goiter, unspecified: Secondary | ICD-10-CM

## 2013-05-05 DIAGNOSIS — E039 Hypothyroidism, unspecified: Secondary | ICD-10-CM

## 2013-05-05 DIAGNOSIS — E785 Hyperlipidemia, unspecified: Secondary | ICD-10-CM

## 2013-05-05 DIAGNOSIS — R609 Edema, unspecified: Secondary | ICD-10-CM

## 2013-05-05 DIAGNOSIS — M549 Dorsalgia, unspecified: Secondary | ICD-10-CM

## 2013-05-05 DIAGNOSIS — J309 Allergic rhinitis, unspecified: Secondary | ICD-10-CM

## 2013-05-05 LAB — RENAL FUNCTION PANEL
Albumin: 4.9 g/dL (ref 3.5–5.2)
BUN: 13 mg/dL (ref 6–23)
Calcium: 11.3 mg/dL — ABNORMAL HIGH (ref 8.4–10.5)
Creat: 0.66 mg/dL (ref 0.50–1.10)
Glucose, Bld: 97 mg/dL (ref 70–99)

## 2013-05-05 LAB — CBC
HCT: 41.8 % (ref 36.0–46.0)
Hemoglobin: 14.1 g/dL (ref 12.0–15.0)
MCH: 31 pg (ref 26.0–34.0)
MCHC: 33.7 g/dL (ref 30.0–36.0)

## 2013-05-05 LAB — HEPATIC FUNCTION PANEL
Albumin: 4.9 g/dL (ref 3.5–5.2)
Alkaline Phosphatase: 86 U/L (ref 39–117)
Total Bilirubin: 0.7 mg/dL (ref 0.3–1.2)

## 2013-05-05 LAB — TSH: TSH: 1.478 u[IU]/mL (ref 0.350–4.500)

## 2013-05-05 LAB — LIPID PANEL
HDL: 69 mg/dL (ref >39)
Triglycerides: 63 mg/dL (ref <150)

## 2013-05-05 MED ORDER — METOPROLOL SUCCINATE ER 25 MG PO TB24: 25.0000 mg | ORAL_TABLET | Freq: Every day | ORAL | Status: DC

## 2013-05-05 MED ORDER — CYCLOBENZAPRINE HCL 10 MG PO TABS: 10.0000 mg | ORAL_TABLET | Freq: Three times a day (TID) | ORAL | Status: DC | PRN

## 2013-05-05 MED ORDER — TRIAMTERENE-HCTZ 37.5-25 MG PO CAPS: 1.0000 | ORAL_CAPSULE | Freq: Every day | ORAL | Status: DC | PRN

## 2013-05-05 MED ORDER — ZOLPIDEM TARTRATE 10 MG PO TABS: 10.0000 mg | ORAL_TABLET | Freq: Every evening | ORAL | Status: DC | PRN

## 2013-05-05 MED ORDER — PHENTERMINE HCL 37.5 MG PO TABS: 37.5000 mg | ORAL_TABLET | Freq: Every day | ORAL | Status: DC

## 2013-05-05 MED ORDER — LISINOPRIL 10 MG PO TABS: 10.0000 mg | ORAL_TABLET | Freq: Every day | ORAL | Status: DC

## 2013-05-05 MED ORDER — FLUTICASONE PROPIONATE 50 MCG/ACT NA SUSP: 2.0000 | Freq: Every day | NASAL | Status: DC

## 2013-05-05 NOTE — Progress Notes (Signed)
Patient ID: [name] [name], female   DOB: Jul 11, 1961, 52 y.o.   MRN: [id number] [name] [name] [id number] Oct 05, 1960 05/05/2013      Progress Note-Follow Up  Subjective  Chief Complaint  Chief Complaint  Patient presents with  . Follow-up    3 month    HPI  Patient is a 52 year old Caucasian female who is in followup. Her major complaint is of chronic neck pain. She's pain management monthly. Has had some injections which have been marginally helpful. She denies recent illness. She's: Weight loss program as well. No fevers or chills. Chest palpitations and shortness of breath GI or GU complaints at this time. Struggling with insomnia. Sleepy time tea and marginally helpful and  Past Medical History  Diagnosis Date  . Ovarian cyst, bilateral   . Chronic back pain   . Morbid obesity   . Hypertension   . Anxiety and depression     chronic  . History of fibromyalgia     suspected   . Insomnia     History reviewed. No pertinent past surgical history.  History reviewed. No pertinent family history.  History   Social History  . Marital Status: Divorced    Spouse Name: N/A    Number of Children: N/A  . Years of Education: N/A   Occupational History  . Not on file.   Social History Main Topics  . Smoking status: Former Games developer  . Smokeless tobacco: Never Used  . Alcohol Use: Yes     Comment: occassionally  . Drug Use: Yes    Special: Oxycodone  . Sexually Active: Not on file   Other Topics Concern  . Not on file   Social History Narrative  . No narrative on file    Current Outpatient Prescriptions on File Prior to Visit  Medication Sig Dispense Refill  . ALPRAZolam (XANAX) 1 MG tablet TAKE 1 TABLET BY MOUTH 3 TIMES A DAY AS NEEDED  90 tablet  1  . Oxycodone HCl 20 MG TABS Take 1 tablet (20 mg total) by mouth every 6 (six) hours as needed. For pain, not over 4 per day  120 each  0  . OxyCODONE HCl ER (OXYCONTIN) 30 MG T12A Take 1 tablet by mouth 2 (two) times daily.        No current facility-administered medications on file prior to visit.    Allergies  Allergen Reactions  . Cymbalta (Duloxetine Hcl) Other (See Comments)    Headaches  . Fiorinal (Butalbital-Aspirin-Caffeine) Other (See Comments)    Mental Clarity.    Review of Systems  Review of Systems  Constitutional: Positive for malaise/fatigue. Negative for fever.  HENT: Negative for congestion.   Eyes: Negative for discharge.  Respiratory: Negative for shortness of breath.   Cardiovascular: Negative for chest pain, palpitations and leg swelling.  Gastrointestinal: Negative for nausea, abdominal pain and diarrhea.  Genitourinary: Negative for dysuria.  Musculoskeletal: Positive for back pain. Negative for falls.  Skin: Negative for rash.  Neurological: Negative for loss of consciousness and headaches.  Endo/Heme/Allergies: Negative for polydipsia.  Psychiatric/Behavioral: Negative for depression and suicidal ideas. The patient is not nervous/anxious and does not have insomnia.     Objective  Ht 5' 5.5" (1.664 m)  Physical Exam  Physical Exam  Constitutional: She is oriented to person, place, and time and well-developed, well-nourished, and in no distress. No distress.  HENT:  Head: Normocephalic and atraumatic.  Eyes: Conjunctivae are normal.  Neck: Neck supple. No thyromegaly present.  Cardiovascular:  Normal rate, regular rhythm and normal heart sounds.   No murmur heard. Pulmonary/Chest: Effort normal and breath sounds normal. She has no wheezes.  Abdominal: She exhibits no distension and no mass.  Musculoskeletal: She exhibits no edema.  Lymphadenopathy:    She has no cervical adenopathy.  Neurological: She is alert and oriented to person, place, and time.  Skin: Skin is warm and dry. No rash noted. She is not diaphoretic.  Psychiatric: Memory, affect and judgment normal.    Lab Results  Component Value Date   TSH 2.260 06/07/2012   Lab Results  Component Value  Date   WBC 7.5 01/04/2013   HGB 13.4 01/04/2013   HCT 39.5 01/04/2013   MCV 92.5 01/04/2013   PLT 244 01/04/2013   Lab Results  Component Value Date   CREATININE 0.58 06/07/2012   BUN 9 06/07/2012   NA 141 06/07/2012   K 4.3 06/07/2012   CL 100 06/07/2012   CO2 34* 06/07/2012   Lab Results  Component Value Date   ALT 31 01/04/2013   AST 20 01/04/2013   ALKPHOS 67 01/04/2013   BILITOT 0.3 01/04/2013   Lab Results  Component Value Date   CHOL 195 01/04/2013   Lab Results  Component Value Date   HDL 60 01/04/2013   Lab Results  Component Value Date   LDLCALC 107* 01/04/2013   Lab Results  Component Value Date   TRIG 141 01/04/2013   Lab Results  Component Value Date   CHOLHDL 3.3 01/04/2013     Assessment & Plan  Hypercalcemia Persistent, will check a PTH and offer referral to endocrinology, minimize calcium in diet  Goiter Will order thyroid ultrasound if patient agrees  ESSENTIAL HYPERTENSION Adequately controlled refills given  Abnormal liver function tests normalized  UNSPECIFIED HYPOTHYROIDISM Resolved at present  MORBID OBESITY Is following with a weight loss prgram. Discouraged from using BHCG treatments. Encouraged dASH diet and increased activity.  BACK PAIN, CHRONIC Is still struggling with chronic pain, has had some epidural injections with mild relief. Given refill on her Flexeril today, encouraged activity as tolerated and gentle as tolerated.

## 2013-05-05 NOTE — Patient Instructions (Addendum)
Next appt with labs prior to visit, lipid, renal, cbc, tsh, hepatic  DASH Diet The DASH diet stands for "Dietary Approaches to Stop Hypertension." It is a healthy eating plan that has been shown to reduce high blood pressure (hypertension) in as little as 14 days, while also possibly providing other significant health benefits. These other health benefits include reducing the risk of breast cancer after menopause and reducing the risk of type 2 diabetes, heart disease, colon cancer, and stroke. Health benefits also include weight loss and slowing kidney failure in patients with chronic kidney disease.  DIET GUIDELINES  Limit salt (sodium). Your diet should contain less than 1500 mg of sodium daily.  Limit refined or processed carbohydrates. Your diet should include mostly whole grains. Desserts and added sugars should be used sparingly.  Include small amounts of heart-healthy fats. These types of fats include nuts, oils, and tub margarine. Limit saturated and trans fats. These fats have been shown to be harmful in the body. CHOOSING FOODS  The following food groups are based on a 2000 calorie diet. See your Registered Dietitian for individual calorie needs. Grains and Grain Products (6 to 8 servings daily)  Eat More Often: Whole-wheat bread, brown rice, whole-grain or wheat pasta, quinoa, popcorn without added fat or salt (air popped).  Eat Less Often: White bread, white pasta, white rice, cornbread. Vegetables (4 to 5 servings daily)  Eat More Often: Fresh, frozen, and canned vegetables. Vegetables may be raw, steamed, roasted, or grilled with a minimal amount of fat.  Eat Less Often/Avoid: Creamed or fried vegetables. Vegetables in a cheese sauce. Fruit (4 to 5 servings daily)  Eat More Often: All fresh, canned (in natural juice), or frozen fruits. Dried fruits without added sugar. One hundred percent fruit juice ( cup [237 mL] daily).  Eat Less Often: Dried fruits with added sugar.  Canned fruit in light or heavy syrup. [employer], Fish, and Poultry (2 servings or less daily. One serving is 3 to 4 oz [85-114 g]).  Eat More Often: Ninety percent or leaner ground beef, tenderloin, sirloin. Round cuts of beef, chicken breast, [place] breast. All fish. Grill, bake, or broil your meat. Nothing should be fried.  Eat Less Often/Avoid: Fatty cuts of meat, [place], or chicken leg, thigh, or wing. Fried cuts of meat or fish. Dairy (2 to 3 servings)  Eat More Often: Low-fat or fat-free milk, low-fat plain or light yogurt, reduced-fat or part-skim cheese.  Eat Less Often/Avoid: Milk (whole, 2%).Whole milk yogurt. Full-fat cheeses. Nuts, Seeds, and Legumes (4 to 5 servings per week)  Eat More Often: All without added salt.  Eat Less Often/Avoid: Salted nuts and seeds, canned beans with added salt. Fats and Sweets (limited)  Eat More Often: Vegetable oils, tub margarines without trans fats, sugar-free gelatin. Mayonnaise and salad dressings.  Eat Less Often/Avoid: Coconut oils, palm oils, butter, stick margarine, cream, half and half, cookies, candy, pie. FOR MORE INFORMATION The Dash Diet Eating Plan: www.dashdiet.org Document Released: 09/04/2011 Document Revised: 12/08/2011 Document Reviewed: 09/04/2011 [facility] Patient Information 2014 [place], [place].

## 2013-05-06 ENCOUNTER — Telehealth: Payer: Self-pay

## 2013-05-06 NOTE — Telephone Encounter (Signed)
Also would repeat a thyroid ultrasound due to hi calcium and history of goiter if she agrees

## 2013-05-06 NOTE — Assessment & Plan Note (Signed)
Will order thyroid ultrasound if patient agrees

## 2013-05-06 NOTE — Telephone Encounter (Signed)
Per Katrina w/[facility] PTH can't be added due to it being a critical frozen

## 2013-05-06 NOTE — Telephone Encounter (Signed)
So let patient know we should run that and have her come back in and do that maybe when she does her thyroid ultrasound and repeat a renal panel with the PTH

## 2013-05-06 NOTE — Assessment & Plan Note (Signed)
Persistent, will check a PTH and offer referral to endocrinology, minimize calcium in diet

## 2013-05-06 NOTE — Telephone Encounter (Signed)
I left a message for pt to return my call.  

## 2013-05-06 NOTE — Telephone Encounter (Signed)
Message copied by Court [name] on Fri May 06, 2013  3:39 PM ------      Message from: Danise Edge A      Created: Fri May 06, 2013  9:48 AM       For her hi calcium since it is persistent, we should check the PTH but also I am going to refer her to endocrinology if she agrees, in the meantime she should minimize calcium in the diet ------

## 2013-05-06 NOTE — Assessment & Plan Note (Signed)
Adequately controlled refills given

## 2013-05-06 NOTE — Assessment & Plan Note (Signed)
normalized 

## 2013-05-07 NOTE — Assessment & Plan Note (Signed)
Resolved at present.

## 2013-05-07 NOTE — Assessment & Plan Note (Signed)
Is following with a weight loss prgram. Discouraged from using BHCG treatments. Encouraged dASH diet and increased activity.

## 2013-05-07 NOTE — Assessment & Plan Note (Signed)
Is still struggling with chronic pain, has had some epidural injections with mild relief. Given refill on her Flexeril today, encouraged activity as tolerated and gentle as tolerated.

## 2013-05-10 ENCOUNTER — Other Ambulatory Visit: Payer: Self-pay | Admitting: Internal Medicine

## 2013-05-10 ENCOUNTER — Other Ambulatory Visit: Payer: Self-pay | Admitting: Family Medicine

## 2013-05-10 NOTE — Telephone Encounter (Signed)
great

## 2013-05-10 NOTE — Telephone Encounter (Signed)
Called pharmacy who stated that they did receive rx on 05/05/13.

## 2013-05-10 NOTE — Telephone Encounter (Signed)
FYI:  Patient informed and states she already has an appt with Dr Allena Katz at the end of the month for the thyroid and will discuss everything with him.

## 2013-05-11 ENCOUNTER — Other Ambulatory Visit: Payer: Self-pay | Admitting: Internal Medicine

## 2013-06-07 ENCOUNTER — Other Ambulatory Visit: Payer: Self-pay | Admitting: Family Medicine

## 2013-06-08 NOTE — Telephone Encounter (Signed)
To early for refills. Alprazolam RX was wrote on 04-13-13 quantity 90 with 1 refill and Flexeril RX was wrote on 05-05-13 quantity 90 with 5 refills

## 2013-06-20 ENCOUNTER — Other Ambulatory Visit: Payer: Self-pay | Admitting: Family Medicine

## 2013-06-20 NOTE — Telephone Encounter (Signed)
RX faxed

## 2013-06-20 NOTE — Telephone Encounter (Signed)
Please advise refill? Last RX was done on 04-13-13 quantity 90 with 1 refill?  If ok fax to [phone]

## 2013-07-02 ENCOUNTER — Encounter: Payer: Self-pay | Admitting: Family Medicine

## 2013-07-30 ENCOUNTER — Other Ambulatory Visit: Payer: Self-pay | Admitting: Family Medicine

## 2013-08-31 ENCOUNTER — Other Ambulatory Visit: Payer: Self-pay | Admitting: Family Medicine

## 2013-09-01 NOTE — Telephone Encounter (Signed)
Please advise?   Last RX was 06-20-13 quantity 90 with 1 refill  If ok fax to [phone]

## 2013-09-01 NOTE — Telephone Encounter (Signed)
RX faxed

## 2013-11-01 ENCOUNTER — Ambulatory Visit: Payer: BC Managed Care – PPO | Admitting: Family Medicine

## 2013-11-11 ENCOUNTER — Telehealth: Payer: Self-pay

## 2013-11-11 DIAGNOSIS — R7309 Other abnormal glucose: Secondary | ICD-10-CM

## 2013-11-11 DIAGNOSIS — I10 Essential (primary) hypertension: Secondary | ICD-10-CM

## 2013-11-11 DIAGNOSIS — E785 Hyperlipidemia, unspecified: Secondary | ICD-10-CM

## 2013-11-11 DIAGNOSIS — E039 Hypothyroidism, unspecified: Secondary | ICD-10-CM

## 2013-11-11 NOTE — Telephone Encounter (Signed)
Lab order placed.

## 2013-11-13 ENCOUNTER — Other Ambulatory Visit: Payer: Self-pay | Admitting: Family Medicine

## 2013-11-14 NOTE — Telephone Encounter (Signed)
Please advise refill?  Last RX was 05-05-13 quantity 90 with 5 refills  If ok fax to [phone]

## 2013-11-25 ENCOUNTER — Other Ambulatory Visit: Payer: Self-pay | Admitting: Family Medicine

## 2013-11-28 NOTE — Telephone Encounter (Signed)
RX faxed

## 2013-11-28 NOTE — Telephone Encounter (Signed)
Please advise refills?  Last Alprazolam rx wrote 08-31-13 quantity 90 with 1 refill  Last Zolpidem RX wrote 05-05-13 quantity 30 with 3 refills  If ok fax to [phone]

## 2013-12-05 ENCOUNTER — Encounter: Payer: Self-pay | Admitting: Family Medicine

## 2013-12-05 ENCOUNTER — Ambulatory Visit (INDEPENDENT_AMBULATORY_CARE_PROVIDER_SITE_OTHER): Payer: Managed Care, Other (non HMO) | Admitting: Family Medicine

## 2013-12-05 DIAGNOSIS — I10 Essential (primary) hypertension: Secondary | ICD-10-CM

## 2013-12-05 DIAGNOSIS — E785 Hyperlipidemia, unspecified: Secondary | ICD-10-CM

## 2013-12-05 DIAGNOSIS — M549 Dorsalgia, unspecified: Secondary | ICD-10-CM

## 2013-12-05 DIAGNOSIS — E669 Obesity, unspecified: Secondary | ICD-10-CM

## 2013-12-05 DIAGNOSIS — G4700 Insomnia, unspecified: Secondary | ICD-10-CM

## 2013-12-05 MED ORDER — ZOLPIDEM TARTRATE 10 MG PO TABS: 10.0000 mg | ORAL_TABLET | Freq: Every evening | ORAL | Status: DC | PRN

## 2013-12-05 MED ORDER — TRIAMTERENE-HCTZ 37.5-25 MG PO CAPS: 1.0000 | ORAL_CAPSULE | Freq: Every day | ORAL | Status: DC

## 2013-12-05 MED ORDER — LISINOPRIL 10 MG PO TABS: 10.0000 mg | ORAL_TABLET | Freq: Every day | ORAL | Status: DC

## 2013-12-05 MED ORDER — CYCLOBENZAPRINE HCL 10 MG PO TABS: 10.0000 mg | ORAL_TABLET | Freq: Three times a day (TID) | ORAL | Status: DC | PRN

## 2013-12-05 MED ORDER — PHENTERMINE HCL 37.5 MG PO TABS: 37.5000 mg | ORAL_TABLET | Freq: Every day | ORAL | Status: DC

## 2013-12-05 MED ORDER — ALPRAZOLAM 1 MG PO TABS: 1.0000 mg | ORAL_TABLET | Freq: Three times a day (TID) | ORAL | Status: DC | PRN

## 2013-12-05 MED ORDER — METOPROLOL SUCCINATE ER 25 MG PO TB24: 25.0000 mg | ORAL_TABLET | Freq: Every day | ORAL | Status: DC

## 2013-12-05 NOTE — Patient Instructions (Signed)
Contrave Bel[facility]q for weight loss   Basic Carbohydrate Counting Basic carbohydrate counting is a way to plan meals. It is done by counting the amount of carbohydrate in foods. Foods that have carbohydrates are starches (grains, beans, starchy vegetables) and sweets. Eating carbohydrates increases blood glucose (sugar) levels. People with diabetes use carbohydrate counting to help keep their blood glucose at a normal level.  COUNTING CARBOHYDRATES IN FOODS The first step in counting carbohydrates is to learn how many carbohydrate servings you should have in every meal. A dietitian can plan this for you. After learning the amount of carbohydrates to include in your meal plan, you can start to choose the carbohydrate-containing foods you want to eat.  There are 2 ways to identify the amount of carbohydrates in the foods you eat.  Read the Nutrition Facts panel on food labels. You need 2 pieces of information from the Nutrition Facts panel to count carbohydrates this way:  Serving size.  Total carbohydrate (in grams). Decide how many servings you will be eating. If it is 1 serving, you will be eating the amount of carbohydrate listed on the panel. If you will be eating 2 servings, you will be eating double the amount of carbohydrate listed on the panel.   Learn serving sizes. A serving size of most carbohydrate-containing foods is about 15 grams (g). Listed below are single serving sizes of common carbohydrate-containing foods:  1 slice bread.   cup unsweetened, dry cereal.   cup hot cereal.   cup rice.   cup mashed potatoes.   cup pasta.  1 cup fresh fruit.   cup canned fruit.  1 cup milk (whole, 2%, or skim).   cup starchy vegetables (peas, corn, or potatoes). Counting carbohydrates this way is similar to looking on the Nutrition Facts panel. Decide how many servings you will eat first. Multiply the number of servings you eat by 15 g. For example, if you have 2 cups of  strawberries, you had 2 servings. That means you had 30 g of carbohydrate (2 servings x 15 g = 30 g). CALCULATING CARBOHYDRATES IN A MEAL Sample dinner  3 oz chicken breast.   cup brown rice.   cup corn.  1 cup fat-free milk.  1 cup strawberries with sugar-free whipped topping. Carbohydrate calculation First, identify the foods that contain carbohydrate:  Rice.  Corn.  Milk.  Strawberries. Calculate the number of servings eaten:  2 servings rice.  1 serving corn.  1 serving milk.  1 serving strawberries. Multiply the number of servings by 15 g:  2 servings rice x 15 g = 30 g.  1 serving corn x 15 g = 15 g.  1 serving milk x 15 g = 15 g.  1 serving strawberries x 15 g = 15 g. Add the amounts to find the total carbohydrates eaten: 30 g + 15 g + 15 g + 15 g = 75 g carbohydrate eaten at dinner. Document Released: 09/15/2005 Document Revised: 12/08/2011 Document Reviewed: 08/01/2011 [facility] Patient Information 2014 [place], [place].

## 2013-12-05 NOTE — Progress Notes (Signed)
Patient ID: NELEH MULDOON, female   DOB: 1961/05/21, 53 y.o.   MRN: [id number] BIRTTANY DECHELLIS [id number] Aug 28, 1961 12/05/2013      Progress Note-Follow Up  Subjective  Chief Complaint  Chief Complaint  Patient presents with  . Follow-up    6 month    HPI  Patient is a 53 year old female in today for routine medical care. Doing well. Continues to struggle with back pain and paresthesias to toes on b/l legs but is managing with help of pain management and chiropractor. Denies any recent illness. Denies CP/palp/SOB/HA/congestion/fevers/GI or GU c/o. Taking meds as prescribed  Past Medical History  Diagnosis Date  . Ovarian cyst, bilateral   . Chronic back pain   . Morbid obesity   . Hypertension   . Anxiety and depression     chronic  . History of fibromyalgia     suspected   . Insomnia     History reviewed. No pertinent past surgical history.  History reviewed. No pertinent family history.  History   Social History  . Marital Status: Divorced    Spouse Name: N/A    Number of Children: N/A  . Years of Education: N/A   Occupational History  . Not on file.   Social History Main Topics  . Smoking status: Former Research scientist (life sciences)  . Smokeless tobacco: Never Used  . Alcohol Use: Yes     Comment: occassionally  . Drug Use: Yes    Special: Oxycodone  . Sexual Activity: Not on file   Other Topics Concern  . Not on file   Social History Narrative  . No narrative on file    Current Outpatient Prescriptions on File Prior to Visit  Medication Sig Dispense Refill  . ALPRAZolam (XANAX) 1 MG tablet TAKE 1 TABLET BY MOUTH 3 TIMES A DAY AS NEEDED  90 tablet  1  . cyclobenzaprine (FLEXERIL) 10 MG tablet TAKE 1 TABLET (10 MG TOTAL) BY MOUTH 3 (THREE) TIMES DAILY AS NEEDED FOR MUSCLE SPASMS.  90 tablet  5  . fluticasone (FLONASE) 50 MCG/ACT nasal spray Place 2 sprays into the nose daily.  16 g  2  . lisinopril (PRINIVIL,ZESTRIL) 10 MG tablet Take 1 tablet (10 mg total) by mouth daily.   90 tablet  1  . metoprolol succinate (TOPROL-XL) 25 MG 24 hr tablet TAKE 1 TABLET BY MOUTH EVERY DAY  30 tablet  6  . triamterene-hydrochlorothiazide (DYAZIDE) 37.5-25 MG per capsule TAKE 1 EACH (1 CAPSULE TOTAL) BY MOUTH EVERY MORNING.  30 capsule  3  . zolpidem (AMBIEN) 10 MG tablet TAKE 1 TABLET BY MOUTH AT BEDTIME AS NEEDED FOR SLEEP  30 tablet  2  . phentermine (ADIPEX-P) 37.5 MG tablet Take 1 tablet (37.5 mg total) by mouth daily before breakfast.  30 tablet  2   No current facility-administered medications on file prior to visit.    Allergies  Allergen Reactions  . Cymbalta [Duloxetine Hcl] Other (See Comments)    Headaches  . Fiorinal [Butalbital-Aspirin-Caffeine] Other (See Comments)    Mental Clarity.    Review of Systems  Review of Systems  Constitutional: Negative for fever and malaise/fatigue.  HENT: Negative for congestion.   Eyes: Negative for discharge.  Respiratory: Negative for shortness of breath.   Cardiovascular: Negative for chest pain, palpitations and leg swelling.  Gastrointestinal: Negative for nausea, abdominal pain and diarrhea.  Genitourinary: Negative for dysuria.  Musculoskeletal: Positive for back pain. Negative for falls.  Skin: Negative for rash.  Neurological: Negative for loss of consciousness and headaches.  Endo/Heme/Allergies: Negative for polydipsia.  Psychiatric/Behavioral: Negative for depression and suicidal ideas. The patient is not nervous/anxious and does not have insomnia.     Objective  BP 120/82  Pulse 79  Temp(Src) 97.8 F (36.6 C) (Oral)  Ht 5' 5.5" (1.664 m)  Wt 229 lb 0.6 oz (103.892 kg)  BMI 37.52 kg/m2  SpO2 98%  Physical Exam  Physical Exam  Constitutional: She is oriented to person, place, and time and well-developed, well-nourished, and in no distress. No distress.  HENT:  Head: Normocephalic and atraumatic.  Eyes: Conjunctivae are normal.  Neck: Neck supple. No thyromegaly present.  Cardiovascular: Normal  rate, regular rhythm and normal heart sounds.   No murmur heard. Pulmonary/Chest: Effort normal and breath sounds normal. She has no wheezes.  Abdominal: She exhibits no distension and no mass.  Musculoskeletal: She exhibits no edema.  Lymphadenopathy:    She has no cervical adenopathy.  Neurological: She is alert and oriented to person, place, and time.  Skin: Skin is warm and dry. No rash noted. She is not diaphoretic.  Psychiatric: Memory, affect and judgment normal.    Lab Results  Component Value Date   TSH 1.478 05/05/2013   Lab Results  Component Value Date   WBC 9.7 05/05/2013   HGB 14.1 05/05/2013   HCT 41.8 05/05/2013   MCV 91.9 05/05/2013   PLT 285 05/05/2013   Lab Results  Component Value Date   CREATININE 0.66 05/05/2013   BUN 13 05/05/2013   NA 139 05/05/2013   K 4.7 05/05/2013   CL 103 05/05/2013   CO2 30 05/05/2013   Lab Results  Component Value Date   ALT 23 05/05/2013   AST 21 05/05/2013   ALKPHOS 86 05/05/2013   BILITOT 0.7 05/05/2013   Lab Results  Component Value Date   CHOL 199 05/05/2013   Lab Results  Component Value Date   HDL 69 05/05/2013   Lab Results  Component Value Date   LDLCALC 117* 05/05/2013   Lab Results  Component Value Date   TRIG 63 05/05/2013   Lab Results  Component Value Date   CHOLHDL 2.9 05/05/2013     Assessment & Plan  INSOMNIA Encouraged good sleep hygiene such as dark, quiet room. No blue/green glowing lights such as computer screens in bedroom. No alcohol or stimulants in evening. Cut down on caffeine as able. Regular exercise is helpful but not just prior to bed time.   MORBID OBESITY Encouraged DASH diet, decreased po intake, increased exercise and minimize trans fats., may continue Phentermine for 3 more months then must stop  Hypercalcemia Patient reports a history of elevated PTH and calcium, continues to follow with endocrinology, Dr Rudi Heap PAIN, CHRONIC Following with pain management and chiropractic

## 2013-12-05 NOTE — Progress Notes (Signed)
Pre visit review using our clinic review tool, if applicable. No additional management support is needed unless otherwise documented below in the visit note. 

## 2013-12-06 ENCOUNTER — Telehealth: Payer: Self-pay | Admitting: Family Medicine

## 2013-12-06 NOTE — Telephone Encounter (Signed)
Relevant patient education assigned to patient using Emmi. ° °

## 2013-12-11 ENCOUNTER — Encounter: Payer: Self-pay | Admitting: Family Medicine

## 2013-12-11 NOTE — Assessment & Plan Note (Signed)
Following with pain management and chiropractic

## 2013-12-11 NOTE — Assessment & Plan Note (Signed)
Encouraged good sleep hygiene such as dark, quiet room. No blue/green glowing lights such as computer screens in bedroom. No alcohol or stimulants in evening. Cut down on caffeine as able. Regular exercise is helpful but not just prior to bed time.  

## 2013-12-11 NOTE — Assessment & Plan Note (Signed)
Patient reports a history of elevated PTH and calcium, continues to follow with endocrinology, Dr Posey Pronto

## 2013-12-11 NOTE — Assessment & Plan Note (Signed)
Encouraged DASH diet, decreased po intake, increased exercise and minimize trans fats., may continue Phentermine for 3 more months then must stop

## 2014-01-19 ENCOUNTER — Ambulatory Visit: Payer: Managed Care, Other (non HMO) | Admitting: Physician Assistant

## 2014-01-19 ENCOUNTER — Telehealth: Payer: Self-pay

## 2014-01-19 NOTE — Telephone Encounter (Signed)
So I am not sure if she means flonase or Singulair. Flonase is like the Beconase spray so she needs one or the other but we can add the Singulair 10 mg po daily prn allergies, disp#30 with 2 rf, come in if no better

## 2014-01-19 NOTE — Telephone Encounter (Signed)
Patient left a message stating that she called yesterday and spoke to a nurse and was put on to see cody today (01-19-14). Patient stated in message that she is getting really bad headaches from her allergies. Pt states the Beconase Aq helps but she would like to try what MD wanted to put her on last year for her allergies? Pt doesn't want to come in would just like md to send in the RX?   Please advise?

## 2014-01-23 MED ORDER — BECLOMETHASONE DIPROP MONOHYD 42 MCG/SPRAY NA SUSP: 1.0000 | Freq: Two times a day (BID) | NASAL | Status: AC

## 2014-01-23 NOTE — Telephone Encounter (Signed)
I sent in the nose spray. Did pt state if she wanted the singulair?

## 2014-01-23 NOTE — Telephone Encounter (Signed)
Pt is requesting the beconase AQ

## 2014-01-23 NOTE — Telephone Encounter (Signed)
No she just wanted the beconase AQ

## 2014-01-27 LAB — HM MAMMOGRAPHY: HM MAMMO: NORMAL

## 2014-02-07 ENCOUNTER — Telehealth: Payer: Self-pay | Admitting: Family Medicine

## 2014-02-07 NOTE — Telephone Encounter (Signed)
Patient thinks she may be bi polar and or adhd.  Should she see Dr Charlett Blake about these or be referred to someone else?

## 2014-02-07 NOTE — Telephone Encounter (Signed)
So please provide her with the phone number for [facility] (maybe [place]) and if she is able she can see them to confirm the diagnosis and then see me to discuss meds or a psychiatrist. She should come see me if she is struggling or feeling fragile or at risk presently.

## 2014-02-07 NOTE — Telephone Encounter (Signed)
Please advise 

## 2014-02-08 NOTE — Telephone Encounter (Signed)
Patient called back regarding this. I informed her of what Dr. Charlett Blake states

## 2014-02-10 ENCOUNTER — Telehealth: Payer: Self-pay | Admitting: Family Medicine

## 2014-02-10 NOTE — Telephone Encounter (Signed)
It is not a good idea because it would likely invalidate her pain contract and then she would not have any one to prescribe her meds long term.

## 2014-02-10 NOTE — Telephone Encounter (Signed)
Pt informed and stated she would try to get worked in

## 2014-02-10 NOTE — Telephone Encounter (Signed)
Patient states that she missed her appointment with the pain clinic and the office cannot get her back in for a month out. She states that she is going to call the pain clinic on Monday to see if she can get "worked in" sometime next week.   Patient would like to know if Dr. Charlett Blake would refill enough medication if the pain clinic cannot get her in before a month?  Oxycodone 20mg . 3 daily oxycoton 40mg . 1 every 12 hours

## 2014-02-23 ENCOUNTER — Ambulatory Visit (INDEPENDENT_AMBULATORY_CARE_PROVIDER_SITE_OTHER): Payer: 59 | Admitting: Licensed Clinical Social Worker

## 2014-02-23 DIAGNOSIS — F4310 Post-traumatic stress disorder, unspecified: Secondary | ICD-10-CM

## 2014-02-27 ENCOUNTER — Other Ambulatory Visit (INDEPENDENT_AMBULATORY_CARE_PROVIDER_SITE_OTHER): Payer: 59

## 2014-02-27 DIAGNOSIS — F4310 Post-traumatic stress disorder, unspecified: Secondary | ICD-10-CM

## 2014-02-28 ENCOUNTER — Ambulatory Visit (INDEPENDENT_AMBULATORY_CARE_PROVIDER_SITE_OTHER): Payer: 59 | Admitting: Licensed Clinical Social Worker

## 2014-02-28 DIAGNOSIS — F4310 Post-traumatic stress disorder, unspecified: Secondary | ICD-10-CM

## 2014-03-01 ENCOUNTER — Telehealth: Payer: Self-pay | Admitting: Family Medicine

## 2014-03-01 NOTE — Telephone Encounter (Signed)
Patient states that she was recently diagnosed with ADHD and is trying to get an appointment with Dr. Erling Cruz. She states that Dr. Erling Cruz cannot see her for about 3 weeks and would like to know if Dr. Charlett Blake would prescribe her enough medication until her appointment.

## 2014-03-02 NOTE — Telephone Encounter (Signed)
Please advise 

## 2014-03-02 NOTE — Telephone Encounter (Signed)
Yes but I would need access to the evaluation that was done and for her to come in to discuss side effects and check vitals before starting

## 2014-03-03 NOTE — Telephone Encounter (Signed)
Appointment scheduled for 03/06/14 and patient will sign release and have records sent.

## 2014-03-03 NOTE — Telephone Encounter (Signed)
Please advise pt of MD's note

## 2014-03-06 ENCOUNTER — Encounter: Payer: Self-pay | Admitting: Family Medicine

## 2014-03-06 ENCOUNTER — Ambulatory Visit (INDEPENDENT_AMBULATORY_CARE_PROVIDER_SITE_OTHER): Payer: Managed Care, Other (non HMO) | Admitting: Family Medicine

## 2014-03-06 VITALS — BP 124/82 | HR 66 | Temp 98.6°F | Ht 65.5 in | Wt 224.1 lb

## 2014-03-06 DIAGNOSIS — R945 Abnormal results of liver function studies: Secondary | ICD-10-CM

## 2014-03-06 DIAGNOSIS — E039 Hypothyroidism, unspecified: Secondary | ICD-10-CM

## 2014-03-06 DIAGNOSIS — I10 Essential (primary) hypertension: Secondary | ICD-10-CM

## 2014-03-06 DIAGNOSIS — F988 Other specified behavioral and emotional disorders with onset usually occurring in childhood and adolescence: Secondary | ICD-10-CM

## 2014-03-06 DIAGNOSIS — R252 Cramp and spasm: Secondary | ICD-10-CM

## 2014-03-06 DIAGNOSIS — E785 Hyperlipidemia, unspecified: Secondary | ICD-10-CM

## 2014-03-06 DIAGNOSIS — R7989 Other specified abnormal findings of blood chemistry: Secondary | ICD-10-CM

## 2014-03-06 LAB — CALCIUM, IONIZED: Calcium, Ion: 1.38 mmol/L — ABNORMAL HIGH (ref 1.12–1.32)

## 2014-03-06 MED ORDER — AMPHETAMINE-DEXTROAMPHETAMINE 10 MG PO TABS: 10.0000 mg | ORAL_TABLET | Freq: Two times a day (BID) | ORAL | Status: DC

## 2014-03-06 NOTE — Assessment & Plan Note (Signed)
Repeat LFTs today.

## 2014-03-06 NOTE — Patient Instructions (Signed)
Drink 64 oz of clear fluids  Attention Deficit Hyperactivity Disorder Attention deficit hyperactivity disorder (ADHD) is a problem with behavior issues based on the way the brain functions (neurobehavioral disorder). It is a common reason for behavior and academic problems in school. SYMPTOMS  There are 3 types of ADHD. The 3 types and some of the symptoms include:  Inattentive  Gets bored or distracted easily.  Loses or forgets things. Forgets to hand in homework.  Has trouble organizing or completing tasks.  Difficulty staying on task.  An inability to organize daily tasks and school work.  Leaving projects, chores, or homework unfinished.  Trouble paying attention or responding to details. Careless mistakes.  Difficulty following directions. Often seems like is not listening.  Dislikes activities that require sustained attention (like chores or homework).  Hyperactive-impulsive  Feels like it is impossible to sit still or stay in a seat. Fidgeting with hands and feet.  Trouble waiting turn.  Talking too much or out of turn. Interruptive.  Speaks or acts impulsively.  Aggressive, disruptive behavior.  Constantly busy or on the go, noisy.  Often leaves seat when they are expected to remain seated.  Often runs or climbs where it is not appropriate, or feels very restless.  Combined  Has symptoms of both of the above. Often children with ADHD feel discouraged about themselves and with school. They often perform well below their abilities in school. As children get older, the excess motor activities can calm down, but the problems with paying attention and staying organized persist. Most children do not outgrow ADHD but with good treatment can learn to cope with the symptoms. DIAGNOSIS  When ADHD is suspected, the diagnosis should be made by professionals trained in ADHD. This professional will collect information about the individual suspected of having ADHD.  Information must be collected from various settings where the person lives, works, or attends school.  Diagnosis will include:  Confirming symptoms began in childhood.  Ruling out other reasons for the child's behavior.  The health care providers will check with the child's school and check their medical records.  They will talk to teachers and parents.  Behavior rating scales for the child will be filled out by those dealing with the child on a daily basis. A diagnosis is made only after all information has been considered. TREATMENT  Treatment usually includes behavioral treatment, tutoring or extra support in school, and stimulant medicines. Because of the way a person's brain works with ADHD, these medicines decrease impulsivity and hyperactivity and increase attention. This is different than how they would work in a person who does not have ADHD. Other medicines used include antidepressants and certain blood pressure medicines. Most experts agree that treatment for ADHD should address all aspects of the person's functioning. Along with medicines, treatment should include structured classroom management at school. Parents should reward good behavior, provide constant discipline, and limit-setting. Tutoring should be available for the child as needed. ADHD is a life-long condition. If untreated, the disorder can have long-term serious effects into adolescence and adulthood. HOME CARE INSTRUCTIONS   Often with ADHD there is a lot of frustration among family members dealing with the condition. Blame and anger are also feelings that are common. In many cases, because the problem affects the family as a whole, the entire family may need help. A therapist can help the family find better ways to handle the disruptive behaviors of the person with ADHD and promote change. If the person with ADHD  is young, most of the therapist's work is with the parents. Parents will learn techniques for coping with  and improving their child's behavior. Sometimes only the child with the ADHD needs counseling. Your health care providers can help you make these decisions.  Children with ADHD may need help learning how to organize. Some helpful tips include:  Keep routines the same every day from wake-up time to bedtime. Schedule all activities, including homework and playtime. Keep the schedule in a place where the person with ADHD will often see it. Mark schedule changes as far in advance as possible.  Schedule outdoor and indoor recreation.  Have a place for everything and keep everything in its place. This includes clothing, backpacks, and school supplies.  Encourage writing down assignments and bringing home needed books. Work with your child's teachers for assistance in organizing school work.  Offer your child a well-balanced diet. Breakfast that includes a balance of whole grains, protein and, fruits or vegetables is especially important for school performance. Children should avoid drinks with caffeine including:  Soft drinks.  Coffee.  Tea.  However, some older children (adolescents) may find these drinks helpful in improving their attention. Because it can also be common for adolescents with ADHD to become addicted to caffeine, talk with your health care provider about what is a safe amount of caffeine intake for your child.  Children with ADHD need consistent rules that they can understand and follow. If rules are followed, give small rewards. Children with ADHD often receive, and expect, criticism. Look for good behavior and praise it. Set realistic goals. Give clear instructions. Look for activities that can foster success and self-esteem. Make time for pleasant activities with your child. Give lots of affection.  Parents are their children's greatest advocates. Learn as much as possible about ADHD. This helps you become a stronger and better advocate for your child. It also helps you educate  your child's teachers and instructors if they feel inadequate in these areas. Parent support groups are often helpful. A national group with local chapters is called Children and Adults with Attention Deficit Hyperactivity Disorder (CHADD). SEEK MEDICAL CARE IF:  Your child has repeated muscle twitches, cough or speech outbursts.  Your child has sleep problems.  Your child has a marked loss of appetite.  Your child develops depression.  Your child has new or worsening behavioral problems.  Your child develops dizziness.  Your child has a racing heart.  Your child has stomach pains.  Your child develops headaches. SEEK IMMEDIATE MEDICAL CARE IF:  Your child has been diagnosed with depression or anxiety and the symptoms seem to be getting worse.  Your child has been depressed and suddenly appears to have increased energy or motivation.  You are worried that your child is having a bad reaction to a medication he or she is taking for ADHD. Document Released: 09/05/2002 Document Revised: 07/06/2013 Document Reviewed: 05/23/2013 [facility] Patient Information 2014 [place].

## 2014-03-06 NOTE — Assessment & Plan Note (Addendum)
Repeat renal panel, ionized calcium and vitamin d level. Does see endocrinology, Dr Posey Pronto is encouraged to return there for eval. Is asymptomatic

## 2014-03-06 NOTE — Progress Notes (Signed)
Pre visit review using our clinic review tool, if applicable. No additional management support is needed unless otherwise documented below in the visit note. 

## 2014-03-06 NOTE — Assessment & Plan Note (Signed)
Tolerating statin, encouraged heart healthy diet, avoid trans fats, minimize simple carbs and saturated fats. Increase exercise as tolerated 

## 2014-03-06 NOTE — Assessment & Plan Note (Signed)
Well controlled, no changes to meds. Encouraged heart healthy diet such as the DASH diet and exercise as tolerated.  °

## 2014-03-06 NOTE — Progress Notes (Signed)
Patient ID: LAJOYA DOMBEK, female   DOB: 01/28/1961, 53 y.o.   MRN: [id number] ZULEMA PULASKI [id number] 1960-10-29 03/06/2014      Progress Note-Follow Up  Subjective  Chief Complaint  Chief Complaint  Patient presents with  . discuss ADHD medications    HPI  Patient is a 53 year old female in today for routine medical care. Patient is in today for reevaluation and consideration of a new diagnosis. She has been seeing behavioral health and has been tested and ADHD he has been confirmed. She has trouble with focusing and completing tasks. Notes she is having a significant trouble clutter in her home and would like to start medications but is having trouble getting a quick appointment with psychiatry. Feels otherwise anxious but physically well. No recent illness. Does have some trouble with muscle cramps and has noted some increase in that recently. Denies CP/palp/SOB/HA/congestion/fevers/GI or GU c/o. Taking meds as prescribed  Past Medical History  Diagnosis Date  . Ovarian cyst, bilateral   . Chronic back pain   . Morbid obesity   . Hypertension   . Anxiety and depression     chronic  . History of fibromyalgia     suspected   . Insomnia   . Depression with anxiety 08/23/2008    Qualifier: Diagnosis of  By: Niel Hummer MD, Lorinda Creed     No past surgical history on file.  No family history on file.  History   Social History  . Marital Status: Divorced    Spouse Name: N/A    Number of Children: N/A  . Years of Education: N/A   Occupational History  . Not on file.   Social History Main Topics  . Smoking status: Former Research scientist (life sciences)  . Smokeless tobacco: Never Used  . Alcohol Use: Yes     Comment: occassionally  . Drug Use: Yes    Special: Oxycodone  . Sexual Activity: Not on file   Other Topics Concern  . Not on file   Social History Narrative  . No narrative on file    Current Outpatient Prescriptions on File Prior to Visit  Medication Sig Dispense Refill  .  ALPRAZolam (XANAX) 1 MG tablet Take 1 tablet (1 mg total) by mouth 3 (three) times daily as needed for anxiety.  90 tablet  2  . beclomethasone (BECONASE-AQ) 42 MCG/SPRAY nasal spray Place 1 spray into both nostrils 2 (two) times daily. Dose is for each nostril.  25 g  1  . cyclobenzaprine (FLEXERIL) 10 MG tablet Take 1 tablet (10 mg total) by mouth 3 (three) times daily as needed for muscle spasms.  90 tablet  2  . fluticasone (FLONASE) 50 MCG/ACT nasal spray Place 2 sprays into the nose daily.  16 g  2  . gabapentin (NEURONTIN) 300 MG capsule       . lisinopril (PRINIVIL,ZESTRIL) 10 MG tablet Take 1 tablet (10 mg total) by mouth daily.  90 tablet  1  . metoprolol succinate (TOPROL-XL) 25 MG 24 hr tablet Take 1 tablet (25 mg total) by mouth daily.  90 tablet  1  . Oxycodone HCl 20 MG TABS Take 20 mg by mouth every 6 (six) hours as needed. For pain, not over 3 per day      . OxyCODONE HCl ER (OXYCONTIN) 30 MG T12A Take 1 tablet by mouth 2 (two) times daily.      Marland Kitchen triamterene-hydrochlorothiazide (DYAZIDE) 37.5-25 MG per capsule Take 1 each (1 capsule total) by  mouth daily.  90 capsule  1  . zolpidem (AMBIEN) 10 MG tablet Take 1 tablet (10 mg total) by mouth at bedtime as needed for sleep.  30 tablet  2   No current facility-administered medications on file prior to visit.    Allergies  Allergen Reactions  . Cymbalta [Duloxetine Hcl] Other (See Comments)    Headaches  . Fiorinal [Butalbital-Aspirin-Caffeine] Other (See Comments)    Mental Clarity.    Review of Systems  Review of Systems  Constitutional: Negative for fever and malaise/fatigue.  HENT: Negative for congestion.   Eyes: Negative for discharge.  Respiratory: Negative for shortness of breath.   Cardiovascular: Negative for chest pain, palpitations and leg swelling.  Gastrointestinal: Negative for nausea, abdominal pain and diarrhea.  Genitourinary: Negative for dysuria.  Musculoskeletal: Negative for falls.  Skin: Negative  for rash.  Neurological: Negative for loss of consciousness and headaches.  Endo/Heme/Allergies: Negative for polydipsia.  Psychiatric/Behavioral: Positive for depression. Negative for suicidal ideas. The patient is nervous/anxious. The patient does not have insomnia.     Objective  BP 124/82  Pulse 66  Temp(Src) 98.6 F (37 C) (Oral)  Ht 5' 5.5" (1.664 m)  Wt 224 lb 1.3 oz (101.642 kg)  BMI 36.71 kg/m2  SpO2 96%  Physical Exam  Physical Exam  Constitutional: She is oriented to person, place, and time and well-developed, well-nourished, and in no distress. No distress.  HENT:  Head: Normocephalic and atraumatic.  Eyes: Conjunctivae are normal.  Neck: Neck supple. No thyromegaly present.  Cardiovascular: Normal rate, regular rhythm and normal heart sounds.   No murmur heard. Pulmonary/Chest: Effort normal and breath sounds normal. She has no wheezes.  Abdominal: She exhibits no distension and no mass.  Musculoskeletal: She exhibits no edema.  Lymphadenopathy:    She has no cervical adenopathy.  Neurological: She is alert and oriented to person, place, and time.  Skin: Skin is warm and dry. No rash noted. She is not diaphoretic.  Psychiatric: Memory, affect and judgment normal.    Lab Results  Component Value Date   TSH 1.478 05/05/2013   Lab Results  Component Value Date   WBC 9.7 05/05/2013   HGB 14.1 05/05/2013   HCT 41.8 05/05/2013   MCV 91.9 05/05/2013   PLT 285 05/05/2013   Lab Results  Component Value Date   CREATININE 0.66 05/05/2013   BUN 13 05/05/2013   NA 139 05/05/2013   K 4.7 05/05/2013   CL 103 05/05/2013   CO2 30 05/05/2013   Lab Results  Component Value Date   ALT 23 05/05/2013   AST 21 05/05/2013   ALKPHOS 86 05/05/2013   BILITOT 0.7 05/05/2013   Lab Results  Component Value Date   CHOL 199 05/05/2013   Lab Results  Component Value Date   HDL 69 05/05/2013   Lab Results  Component Value Date   LDLCALC 117* 05/05/2013   Lab Results  Component Value Date   TRIG  63 05/05/2013   Lab Results  Component Value Date   CHOLHDL 2.9 05/05/2013     Assessment & Plan  ESSENTIAL HYPERTENSION Well controlled, no changes to meds. Encouraged heart healthy diet such as the DASH diet and exercise as tolerated.   MORBID OBESITY Encouraged DASH diet, decrease po intake and increase exercise as tolerated. Needs 7-8 hours of sleep nightly. Avoid trans fats, eat small, frequent meals every 4-5 hours with lean proteins, complex carbs and healthy fats. Minimize simple carbs, GMO foods.  Hypercalcemia  Repeat renal panel, ionized calcium and vitamin d level. Does see endocrinology, Dr Posey Pronto is encouraged to return there for eval. Is asymptomatic  HYPERLIPIDEMIA Tolerating statin, encouraged heart healthy diet, avoid trans fats, minimize simple carbs and saturated fats. Increase exercise as tolerated  Abnormal liver function tests Repeat LFTs today  ADD (attention deficit disorder) Encouraged to proceed with psychiatric evaluation. Will start Adderall at low dose after stopping Phentermine today. Warned regarding possible side effects and to stop if any concerns. Return in one month for vitals check and reeval

## 2014-03-06 NOTE — Assessment & Plan Note (Signed)
Encouraged DASH diet, decrease po intake and increase exercise as tolerated. Needs 7-8 hours of sleep nightly. Avoid trans fats, eat small, frequent meals every 4-5 hours with lean proteins, complex carbs and healthy fats. Minimize simple carbs, GMO foods. 

## 2014-03-06 NOTE — Assessment & Plan Note (Signed)
Encouraged to proceed with psychiatric evaluation. Will start Adderall at low dose after stopping Phentermine today. Warned regarding possible side effects and to stop if any concerns. Return in one month for vitals check and reeval

## 2014-03-07 LAB — RENAL FUNCTION PANEL
Albumin: 4 g/dL (ref 3.5–5.2)
BUN: 13 mg/dL (ref 6–23)
CHLORIDE: 102 meq/L (ref 96–112)
CO2: 28 mEq/L (ref 19–32)
Calcium: 10.4 mg/dL (ref 8.4–10.5)
Creat: 0.47 mg/dL — ABNORMAL LOW (ref 0.50–1.10)
Glucose, Bld: 100 mg/dL — ABNORMAL HIGH (ref 70–99)
PHOSPHORUS: 3.8 mg/dL (ref 2.3–4.6)
POTASSIUM: 4.2 meq/L (ref 3.5–5.3)
Sodium: 137 mEq/L (ref 135–145)

## 2014-03-07 LAB — HEPATIC FUNCTION PANEL
ALBUMIN: 4 g/dL (ref 3.5–5.2)
ALT: 20 U/L (ref 0–35)
AST: 17 U/L (ref 0–37)
Alkaline Phosphatase: 73 U/L (ref 39–117)
Bilirubin, Direct: 0.1 mg/dL (ref 0.0–0.3)
Indirect Bilirubin: 0.2 mg/dL (ref 0.2–1.2)
Total Bilirubin: 0.3 mg/dL (ref 0.2–1.2)
Total Protein: 6.1 g/dL (ref 6.0–8.3)

## 2014-03-07 LAB — LIPID PANEL
Cholesterol: 202 mg/dL — ABNORMAL HIGH (ref 0–200)
HDL: 53 mg/dL (ref >39)
LDL CALC: 84 mg/dL (ref 0–99)
Total CHOL/HDL Ratio: 3.8 Ratio
Triglycerides: 326 mg/dL — ABNORMAL HIGH (ref <150)
VLDL: 65 mg/dL — AB (ref 0–40)

## 2014-03-07 LAB — TSH: TSH: 4.096 u[IU]/mL (ref 0.350–4.500)

## 2014-03-07 LAB — MAGNESIUM: Magnesium: 1.8 mg/dL (ref 1.5–2.5)

## 2014-03-07 LAB — VITAMIN D 25 HYDROXY (VIT D DEFICIENCY, FRACTURES): VIT D 25 HYDROXY: 42 ng/mL (ref 30–89)

## 2014-03-23 ENCOUNTER — Encounter: Payer: Self-pay | Admitting: Family Medicine

## 2014-04-04 ENCOUNTER — Telehealth (HOSPITAL_COMMUNITY): Payer: Self-pay

## 2014-04-04 ENCOUNTER — Encounter (HOSPITAL_COMMUNITY): Payer: Self-pay | Admitting: Psychiatry

## 2014-04-04 ENCOUNTER — Ambulatory Visit (INDEPENDENT_AMBULATORY_CARE_PROVIDER_SITE_OTHER): Payer: 59 | Admitting: Psychiatry

## 2014-04-04 VITALS — BP 127/83 | HR 83 | Ht 65.0 in | Wt 222.6 lb

## 2014-04-04 DIAGNOSIS — F988 Other specified behavioral and emotional disorders with onset usually occurring in childhood and adolescence: Secondary | ICD-10-CM

## 2014-04-04 DIAGNOSIS — Z8659 Personal history of other mental and behavioral disorders: Secondary | ICD-10-CM

## 2014-04-04 DIAGNOSIS — Z79899 Other long term (current) drug therapy: Secondary | ICD-10-CM

## 2014-04-04 DIAGNOSIS — F4310 Post-traumatic stress disorder, unspecified: Secondary | ICD-10-CM

## 2014-04-04 MED ORDER — AMPHETAMINE-DEXTROAMPHETAMINE 15 MG PO TABS: 15.0000 mg | ORAL_TABLET | Freq: Two times a day (BID) | ORAL | Status: DC
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
## 2014-04-07 ENCOUNTER — Ambulatory Visit (INDEPENDENT_AMBULATORY_CARE_PROVIDER_SITE_OTHER): Payer: Managed Care, Other (non HMO) | Admitting: Family Medicine

## 2014-04-07 ENCOUNTER — Encounter: Payer: Self-pay | Admitting: Family Medicine

## 2014-04-07 VITALS — BP 122/74 | HR 74 | Temp 98.4°F | Ht 65.5 in | Wt 225.1 lb

## 2014-04-07 DIAGNOSIS — I10 Essential (primary) hypertension: Secondary | ICD-10-CM

## 2014-04-07 DIAGNOSIS — F418 Other specified anxiety disorders: Secondary | ICD-10-CM

## 2014-04-07 DIAGNOSIS — F341 Dysthymic disorder: Secondary | ICD-10-CM

## 2014-04-07 DIAGNOSIS — F988 Other specified behavioral and emotional disorders with onset usually occurring in childhood and adolescence: Secondary | ICD-10-CM

## 2014-04-07 DIAGNOSIS — E785 Hyperlipidemia, unspecified: Secondary | ICD-10-CM

## 2014-04-07 MED ORDER — AMPHETAMINE-DEXTROAMPHETAMINE 15 MG PO TABS: 15.0000 mg | ORAL_TABLET | Freq: Two times a day (BID) | ORAL | Status: DC

## 2014-04-07 NOTE — Progress Notes (Signed)
Patient ID: [name] [name], female   DOB: September 02, 1961, 53 y.o.   MRN: [id number] CEL[facility] [id number] May 28, 1961 04/07/2014      Progress Note-Follow Up  Subjective  Chief Complaint  Chief Complaint  Patient presents with  . Follow-up    4 week    HPI  Patient is a 53 year old female in today for routine medical care. Doing well. Has had ADD confirmed by behavioral health and they agree she does not have bipolar disorder. She is to sit Adderall increased to 50 mg with good results. No headache, chest pain, palpitations, anorexia, anxiety increased insomnia. No recent illness or new concerns. Denies CP/palp/SOB/HA/congestion/fevers/GI or GU c/o. Taking meds as prescribed  Past Medical History  Diagnosis Date  . Ovarian cyst, bilateral   . Chronic back pain   . Morbid obesity   . Hypertension   . Anxiety and depression     chronic  . History of fibromyalgia     suspected   . Insomnia   . Depression with anxiety 08/23/2008    Qualifier: Diagnosis of  By: Niel Hummer MD, [place] PTSD (post-traumatic stress disorder)     Past Surgical History  Procedure Laterality Date  . Ovarian cyst removal      Family History  Problem Relation Age of Onset  . ADD / ADHD Neg Hx   . Depression Neg Hx   . Alcohol abuse Neg Hx   . Drug abuse Neg Hx     History   Social History  . Marital Status: Divorced    Spouse Name: N/A    Number of Children: N/A  . Years of Education: N/A   Occupational History  . Not on file.   Social History Main Topics  . Smoking status: Former Smoker    Quit date: 04/04/1989  . Smokeless tobacco: Never Used  . Alcohol Use: Yes     Comment: occassionally- 2 glassses of wine 2x/month  . Drug Use: No     Comment: take meds as prescribed  . Sexual Activity: Not on file   Other Topics Concern  . Not on file   Social History Narrative  . No narrative on file    Current Outpatient Prescriptions on File Prior to Visit  Medication Sig  Dispense Refill  . ALPRAZolam (XANAX) 1 MG tablet Take 1 tablet (1 mg total) by mouth 3 (three) times daily as needed for anxiety.  90 tablet  2  . amphetamine-dextroamphetamine (ADDERALL) 15 MG tablet Take 1 tablet (15 mg total) by mouth 2 (two) times daily.  60 tablet  0  . beclomethasone (BECONASE-AQ) 42 MCG/SPRAY nasal spray Place 1 spray into both nostrils 2 (two) times daily. Dose is for each nostril.  25 g  1  . cyclobenzaprine (FLEXERIL) 10 MG tablet Take 1 tablet (10 mg total) by mouth 3 (three) times daily as needed for muscle spasms.  90 tablet  2  . fluticasone (FLONASE) 50 MCG/ACT nasal spray Place 2 sprays into the nose daily.  16 g  2  . gabapentin (NEURONTIN) 300 MG capsule       . lisinopril (PRINIVIL,ZESTRIL) 10 MG tablet Take 1 tablet (10 mg total) by mouth daily.  90 tablet  1  . metoprolol succinate (TOPROL-XL) 25 MG 24 hr tablet Take 1 tablet (25 mg total) by mouth daily.  90 tablet  1  . Oxycodone HCl 20 MG TABS Take 20 mg by mouth every 6 (six) hours  as needed. For pain, not over 3 per day      . OxyCODONE HCl ER (OXYCONTIN) 30 MG T12A Take 1 tablet by mouth 2 (two) times daily.      Marland Kitchen triamterene-hydrochlorothiazide (DYAZIDE) 37.5-25 MG per capsule Take 1 each (1 capsule total) by mouth daily.  90 capsule  1  . zolpidem (AMBIEN) 10 MG tablet Take 1 tablet (10 mg total) by mouth at bedtime as needed for sleep.  30 tablet  2   No current facility-administered medications on file prior to visit.    Allergies  Allergen Reactions  . Cymbalta [Duloxetine Hcl] Other (See Comments)    Headaches  . Fiorinal [Butalbital-Aspirin-Caffeine] Other (See Comments)    Mental Clarity.    Review of Systems  Review of Systems  Constitutional: Negative for fever and malaise/fatigue.  HENT: Negative for congestion.   Eyes: Negative for discharge.  Respiratory: Negative for shortness of breath.   Cardiovascular: Negative for chest pain, palpitations and leg swelling.   Gastrointestinal: Positive for nausea. Negative for abdominal pain and diarrhea.  Genitourinary: Negative for dysuria.  Musculoskeletal: Negative for falls.  Skin: Negative for rash.  Neurological: Negative for loss of consciousness and headaches.  Endo/Heme/Allergies: Negative for polydipsia.  Psychiatric/Behavioral: Negative for depression and suicidal ideas. The patient is nervous/anxious and has insomnia.     Objective  BP 122/74  Pulse 74  Temp(Src) 98.4 F (36.9 C) (Oral)  Ht 5' 5.5" (1.664 m)  Wt 225 lb 1.9 oz (102.114 kg)  BMI 36.88 kg/m2  SpO2 97%  Physical Exam  Physical Exam  Constitutional: She is oriented to person, place, and time and well-developed, well-nourished, and in no distress. No distress.  HENT:  Head: Normocephalic and atraumatic.  Eyes: Conjunctivae are normal.  Neck: Neck supple. No thyromegaly present.  Cardiovascular: Normal rate, regular rhythm and normal heart sounds.   No murmur heard. Pulmonary/Chest: Effort normal and breath sounds normal. She has no wheezes.  Abdominal: She exhibits no distension and no mass.  Musculoskeletal: She exhibits no edema.  Lymphadenopathy:    She has no cervical adenopathy.  Neurological: She is alert and oriented to person, place, and time.  Skin: Skin is warm and dry. No rash noted. She is not diaphoretic.  Psychiatric: Memory, affect and judgment normal.    Lab Results  Component Value Date   TSH 4.096 03/06/2014   Lab Results  Component Value Date   WBC 9.7 05/05/2013   HGB 14.1 05/05/2013   HCT 41.8 05/05/2013   MCV 91.9 05/05/2013   PLT 285 05/05/2013   Lab Results  Component Value Date   CREATININE 0.47* 03/06/2014   BUN 13 03/06/2014   NA 137 03/06/2014   K 4.2 03/06/2014   CL 102 03/06/2014   CO2 28 03/06/2014   Lab Results  Component Value Date   ALT 20 03/06/2014   AST 17 03/06/2014   ALKPHOS 73 03/06/2014   BILITOT 0.3 03/06/2014   Lab Results  Component Value Date   CHOL 202* 03/06/2014   Lab Results   Component Value Date   HDL 53 03/06/2014   Lab Results  Component Value Date   LDLCALC 84 03/06/2014   Lab Results  Component Value Date   TRIG 326* 03/06/2014   Lab Results  Component Value Date   CHOLHDL 3.8 03/06/2014     Assessment & Plan  ESSENTIAL HYPERTENSION Well controlled, no changes to meds. Encouraged heart healthy diet such as the DASH diet and exercise  as tolerated.   HYPERLIPIDEMIA Encouraged heart healthy diet, increase exercise, avoid trans fats, consider a krill oil cap daily  ADD (attention deficit disorder) Recently diagnosed by behavioral health and has just had Adderall increased to 15 mg po bid am willing to prescribe this medicine  Depression with anxiety Behavioral health does not affirm Bipolar d/o  MORBID OBESITY Encouraged DASH diet, decrease po intake and increase exercise as tolerated. Needs 7-8 hours of sleep nightly. Avoid trans fats, eat small, frequent meals every 4-5 hours with lean proteins, complex carbs and healthy fats. Minimize simple carbs, GMO foods.

## 2014-04-07 NOTE — Assessment & Plan Note (Signed)
Encouraged DASH diet, decrease po intake and increase exercise as tolerated. Needs 7-8 hours of sleep nightly. Avoid trans fats, eat small, frequent meals every 4-5 hours with lean proteins, complex carbs and healthy fats. Minimize simple carbs, GMO foods. 

## 2014-04-07 NOTE — Assessment & Plan Note (Signed)
Behavioral health does not affirm Bipolar d/o

## 2014-04-07 NOTE — Progress Notes (Signed)
Pre visit review using our clinic review tool, if applicable. No additional management support is needed unless otherwise documented below in the visit note. 

## 2014-04-07 NOTE — Assessment & Plan Note (Signed)
Recently diagnosed by behavioral health and has just had Adderall increased to 15 mg po bid am willing to prescribe this medicine

## 2014-04-07 NOTE — Patient Instructions (Signed)

## 2014-04-07 NOTE — Assessment & Plan Note (Signed)
Well controlled, no changes to meds. Encouraged heart healthy diet such as the DASH diet and exercise as tolerated.  °

## 2014-04-07 NOTE — Assessment & Plan Note (Signed)
Encouraged heart healthy diet, increase exercise, avoid trans fats, consider a krill oil cap daily 

## 2014-05-02 ENCOUNTER — Other Ambulatory Visit: Payer: Self-pay | Admitting: Family Medicine

## 2014-05-11 ENCOUNTER — Telehealth: Payer: Self-pay

## 2014-05-11 ENCOUNTER — Encounter: Payer: Self-pay | Admitting: Family Medicine

## 2014-05-11 ENCOUNTER — Ambulatory Visit (INDEPENDENT_AMBULATORY_CARE_PROVIDER_SITE_OTHER): Payer: Managed Care, Other (non HMO) | Admitting: Family Medicine

## 2014-05-11 VITALS — BP 104/68 | HR 69 | Temp 98.1°F | Ht 65.5 in | Wt 221.0 lb

## 2014-05-11 DIAGNOSIS — I10 Essential (primary) hypertension: Secondary | ICD-10-CM

## 2014-05-11 DIAGNOSIS — F988 Other specified behavioral and emotional disorders with onset usually occurring in childhood and adolescence: Secondary | ICD-10-CM

## 2014-05-11 DIAGNOSIS — E785 Hyperlipidemia, unspecified: Secondary | ICD-10-CM

## 2014-05-11 DIAGNOSIS — G4700 Insomnia, unspecified: Secondary | ICD-10-CM

## 2014-05-11 MED ORDER — AMPHETAMINE-DEXTROAMPHETAMINE 20 MG PO TABS: 20.0000 mg | ORAL_TABLET | Freq: Two times a day (BID) | ORAL | Status: DC

## 2014-05-11 MED ORDER — ZOLPIDEM TARTRATE 10 MG PO TABS: 10.0000 mg | ORAL_TABLET | Freq: Every evening | ORAL | Status: DC | PRN

## 2014-05-11 NOTE — Patient Instructions (Signed)

## 2014-05-11 NOTE — Assessment & Plan Note (Signed)
Well controlled, no changes to meds. Encouraged heart healthy diet such as the DASH diet and exercise as tolerated.  °

## 2014-05-11 NOTE — Telephone Encounter (Signed)
Patient brought the Adderall 15 mg RX dated for 8-15 RX.  I shredded RX

## 2014-05-11 NOTE — Assessment & Plan Note (Signed)
Partial response to Adderall 15 mg daily, does not take second dose frequently

## 2014-05-11 NOTE — Progress Notes (Signed)
Pre visit review using our clinic review tool, if applicable. No additional management support is needed unless otherwise documented below in the visit note. 

## 2014-05-14 ENCOUNTER — Encounter: Payer: Self-pay | Admitting: Family Medicine

## 2014-05-14 NOTE — Progress Notes (Signed)
Patient ID: [name] [name], female   DOB: 01/16/1961, 53 y.o.   MRN: [id number] BAILEY FAIELLA [id number] 23-Dec-1960 05/14/2014      Progress Note-Follow Up  Subjective  Chief Complaint  Chief Complaint  Patient presents with  . Follow-up    5 week    HPI  Patient is a 53 year old female in today for routine medical care. No acute complaints of tolerated Adderal and it helped her energy some but she often did not take her second dose due to timing. No HA/cp/palp/anorexia/sob. No recent illness, congestion, fevers  Past Medical History  Diagnosis Date  . Ovarian cyst, bilateral   . Chronic back pain   . Morbid obesity   . Hypertension   . Anxiety and depression     chronic  . History of fibromyalgia     suspected   . Insomnia   . Depression with anxiety 08/23/2008    Qualifier: Diagnosis of  By: Niel Hummer MD, [place] PTSD (post-traumatic stress disorder)     Past Surgical History  Procedure Laterality Date  . Ovarian cyst removal      Family History  Problem Relation Age of Onset  . ADD / ADHD Neg Hx   . Depression Neg Hx   . Alcohol abuse Neg Hx   . Drug abuse Neg Hx     History   Social History  . Marital Status: Divorced    Spouse Name: N/A    Number of Children: N/A  . Years of Education: N/A   Occupational History  . Not on file.   Social History Main Topics  . Smoking status: Former Smoker    Quit date: 04/04/1989  . Smokeless tobacco: Never Used  . Alcohol Use: Yes     Comment: occassionally- 2 glassses of wine 2x/month  . Drug Use: No     Comment: take meds as prescribed  . Sexual Activity: Not on file   Other Topics Concern  . Not on file   Social History Narrative  . No narrative on file    Current Outpatient Prescriptions on File Prior to Visit  Medication Sig Dispense Refill  . ALPRAZolam (XANAX) 1 MG tablet TAKE 1 TABLET BY MOUTH 3 TIMES A DAY AS NEEDED FOR ANXIETY  90 tablet  2  . beclomethasone (BECONASE-AQ) 42 MCG/SPRAY  nasal spray Place 1 spray into both nostrils 2 (two) times daily. Dose is for each nostril.  25 g  1  . cyclobenzaprine (FLEXERIL) 10 MG tablet Take 1 tablet (10 mg total) by mouth 3 (three) times daily as needed for muscle spasms.  90 tablet  2  . lisinopril (PRINIVIL,ZESTRIL) 10 MG tablet Take 1 tablet (10 mg total) by mouth daily.  90 tablet  1  . metoprolol succinate (TOPROL-XL) 25 MG 24 hr tablet Take 1 tablet (25 mg total) by mouth daily.  90 tablet  1  . Oxycodone HCl 20 MG TABS Take 20 mg by mouth every 6 (six) hours as needed. For pain, not over 3 per day      . OxyCODONE HCl ER (OXYCONTIN) 30 MG T12A Take 1 tablet by mouth 2 (two) times daily.      Marland Kitchen triamterene-hydrochlorothiazide (DYAZIDE) 37.5-25 MG per capsule Take 1 each (1 capsule total) by mouth daily.  90 capsule  1   No current facility-administered medications on file prior to visit.    Allergies  Allergen Reactions  . Cymbalta [Duloxetine Hcl] Other (See  Comments)    Headaches  . Fiorinal [Butalbital-Aspirin-Caffeine] Other (See Comments)    Mental Clarity.    Review of Systems  Review of Systems  Constitutional: Positive for malaise/fatigue. Negative for fever.  HENT: Negative for congestion.   Eyes: Negative for discharge.  Respiratory: Negative for shortness of breath.   Cardiovascular: Negative for chest pain, palpitations and leg swelling.  Gastrointestinal: Negative for nausea, abdominal pain and diarrhea.  Genitourinary: Negative for dysuria.  Musculoskeletal: Negative for falls.  Skin: Negative for rash.  Neurological: Negative for loss of consciousness and headaches.  Endo/Heme/Allergies: Negative for polydipsia.  Psychiatric/Behavioral: Negative for depression and suicidal ideas. The patient is not nervous/anxious and does not have insomnia.     Objective  BP 104/68  Pulse 69  Temp(Src) 98.1 F (36.7 C) (Oral)  Ht 5' 5.5" (1.664 m)  Wt 221 lb 0.6 oz (100.263 kg)  BMI 36.21 kg/m2  SpO2  97%  Physical Exam  Physical Exam  Constitutional: She is oriented to person, place, and time and well-developed, well-nourished, and in no distress. No distress.  HENT:  Head: Normocephalic and atraumatic.  Eyes: Conjunctivae are normal.  Neck: Neck supple. No thyromegaly present.  Cardiovascular: Normal rate, regular rhythm and normal heart sounds.   No murmur heard. Pulmonary/Chest: Effort normal and breath sounds normal. She has no wheezes.  Abdominal: She exhibits no distension and no mass.  Musculoskeletal: She exhibits no edema.  Lymphadenopathy:    She has no cervical adenopathy.  Neurological: She is alert and oriented to person, place, and time.  Skin: Skin is warm and dry. No rash noted. She is not diaphoretic.  Psychiatric: Memory, affect and judgment normal.    Lab Results  Component Value Date   TSH 4.096 03/06/2014   Lab Results  Component Value Date   WBC 9.7 05/05/2013   HGB 14.1 05/05/2013   HCT 41.8 05/05/2013   MCV 91.9 05/05/2013   PLT 285 05/05/2013   Lab Results  Component Value Date   CREATININE 0.47* 03/06/2014   BUN 13 03/06/2014   NA 137 03/06/2014   K 4.2 03/06/2014   CL 102 03/06/2014   CO2 28 03/06/2014   Lab Results  Component Value Date   ALT 20 03/06/2014   AST 17 03/06/2014   ALKPHOS 73 03/06/2014   BILITOT 0.3 03/06/2014   Lab Results  Component Value Date   CHOL 202* 03/06/2014   Lab Results  Component Value Date   HDL 53 03/06/2014   Lab Results  Component Value Date   LDLCALC 84 03/06/2014   Lab Results  Component Value Date   TRIG 326* 03/06/2014   Lab Results  Component Value Date   CHOLHDL 3.8 03/06/2014     Assessment & Plan  ESSENTIAL HYPERTENSION Well controlled, no changes to meds. Encouraged heart healthy diet such as the DASH diet and exercise as tolerated.   ADD (attention deficit disorder) Partial response to Adderall 15 mg daily, does not take second dose frequently  MORBID OBESITY Encouraged DASH diet, decrease po intake and  increase exercise as tolerated. Needs 7-8 hours of sleep nightly. Avoid trans fats, eat small, frequent meals every 4-5 hours with lean proteins, complex carbs and healthy fats. Minimize simple carbs, GMO foods. Increased fatigue helped some with Adderall will try increasing dosing to 20 mg and reassess  HYPERLIPIDEMIA Encouraged heart healthy diet, increase exercise, avoid trans fats, consider a krill oil cap daily  INSOMNIA Not worse with adderall but she has been  not taking it late in the day. Encouraged good sleep hygiene such as dark, quiet room. No blue/green glowing lights such as computer screens in bedroom. No alcohol or stimulants in evening. Cut down on caffeine as able. Regular exercise is helpful but not just prior to bed time.

## 2014-05-14 NOTE — Assessment & Plan Note (Signed)
Encouraged heart healthy diet, increase exercise, avoid trans fats, consider a krill oil cap daily 

## 2014-05-14 NOTE — Assessment & Plan Note (Signed)
Not worse with adderall but she has been not taking it late in the day. Encouraged good sleep hygiene such as dark, quiet room. No blue/green glowing lights such as computer screens in bedroom. No alcohol or stimulants in evening. Cut down on caffeine as able. Regular exercise is helpful but not just prior to bed time.

## 2014-05-14 NOTE — Assessment & Plan Note (Signed)
Encouraged DASH diet, decrease po intake and increase exercise as tolerated. Needs 7-8 hours of sleep nightly. Avoid trans fats, eat small, frequent meals every 4-5 hours with lean proteins, complex carbs and healthy fats. Minimize simple carbs, GMO foods. Increased fatigue helped some with Adderall will try increasing dosing to 20 mg and reassess

## 2014-06-09 ENCOUNTER — Encounter: Payer: Self-pay | Admitting: Family Medicine

## 2014-06-09 ENCOUNTER — Ambulatory Visit (INDEPENDENT_AMBULATORY_CARE_PROVIDER_SITE_OTHER): Payer: Managed Care, Other (non HMO) | Admitting: Family Medicine

## 2014-06-09 VITALS — BP 128/91 | HR 60 | Temp 98.3°F | Ht 65.5 in | Wt 219.4 lb

## 2014-06-09 DIAGNOSIS — I10 Essential (primary) hypertension: Secondary | ICD-10-CM

## 2014-06-09 DIAGNOSIS — J069 Acute upper respiratory infection, unspecified: Secondary | ICD-10-CM

## 2014-06-09 DIAGNOSIS — F988 Other specified behavioral and emotional disorders with onset usually occurring in childhood and adolescence: Secondary | ICD-10-CM

## 2014-06-09 DIAGNOSIS — E785 Hyperlipidemia, unspecified: Secondary | ICD-10-CM

## 2014-06-09 MED ORDER — LISINOPRIL 10 MG PO TABS: 10.0000 mg | ORAL_TABLET | Freq: Every day | ORAL | Status: DC

## 2014-06-09 MED ORDER — AMPHETAMINE-DEXTROAMPHETAMINE 20 MG PO TABS: 20.0000 mg | ORAL_TABLET | Freq: Two times a day (BID) | ORAL | Status: DC

## 2014-06-09 NOTE — Progress Notes (Signed)
Pre visit review using our clinic review tool, if applicable. No additional management support is needed unless otherwise documented below in the visit note. 

## 2014-06-09 NOTE — Patient Instructions (Signed)
Encouraged increased rest and hydration, add probiotics, zinc such as Coldeze or Xicam. Treat fevers as needed, Digestive Advantage, Phillips colon health. Mucinex twice daily.   Upper Respiratory Infection, Adult An upper respiratory infection (URI) is also sometimes known as the common cold. The upper respiratory tract includes the nose, sinuses, throat, trachea, and bronchi. Bronchi are the airways leading to the lungs. Most people improve within 1 week, but symptoms can last up to 2 weeks. A residual cough may last even longer.  CAUSES Many different viruses can infect the tissues lining the upper respiratory tract. The tissues become irritated and inflamed and often become very moist. Mucus production is also common. A cold is contagious. You can easily spread the virus to others by oral contact. This includes kissing, sharing a glass, coughing, or sneezing. Touching your mouth or nose and then touching a surface, which is then touched by another person, can also spread the virus. SYMPTOMS  Symptoms typically develop 1 to 3 days after you come in contact with a cold virus. Symptoms vary from person to person. They may include:  Runny nose.  Sneezing.  Nasal congestion.  Sinus irritation.  Sore throat.  Loss of voice (laryngitis).  Cough.  Fatigue.  Muscle aches.  Loss of appetite.  Headache.  Low-grade fever. DIAGNOSIS  You might diagnose your own cold based on familiar symptoms, since most people get a cold 2 to 3 times a year. Your caregiver can confirm this based on your exam. Most importantly, your caregiver can check that your symptoms are not due to another disease such as strep throat, sinusitis, pneumonia, asthma, or epiglottitis. Blood tests, throat tests, and X-rays are not necessary to diagnose a common cold, but they may sometimes be helpful in excluding other more serious diseases. Your caregiver will decide if any further tests are required. RISKS AND  COMPLICATIONS  You may be at risk for a more severe case of the common cold if you smoke cigarettes, have chronic heart disease (such as heart failure) or lung disease (such as asthma), or if you have a weakened immune system. The very young and very old are also at risk for more serious infections. Bacterial sinusitis, middle ear infections, and bacterial pneumonia can complicate the common cold. The common cold can worsen asthma and chronic obstructive pulmonary disease (COPD). Sometimes, these complications can require emergency medical care and may be life-threatening. PREVENTION  The best way to protect against getting a cold is to practice good hygiene. Avoid oral or hand contact with people with cold symptoms. Wash your hands often if contact occurs. There is no clear evidence that vitamin C, vitamin E, echinacea, or exercise reduces the chance of developing a cold. However, it is always recommended to get plenty of rest and practice good nutrition. TREATMENT  Treatment is directed at relieving symptoms. There is no cure. Antibiotics are not effective, because the infection is caused by a virus, not by bacteria. Treatment may include:  Increased fluid intake. Sports drinks offer valuable electrolytes, sugars, and fluids.  Breathing heated mist or steam (vaporizer or shower).  Eating chicken soup or other clear broths, and maintaining good nutrition.  Getting plenty of rest.  Using gargles or lozenges for comfort.  Controlling fevers with ibuprofen or acetaminophen as directed by your caregiver.  Increasing usage of your inhaler if you have asthma. Zinc gel and zinc lozenges, taken in the first 24 hours of the common cold, can shorten the duration and lessen the severity of  symptoms. Pain medicines may help with fever, muscle aches, and throat pain. A variety of non-prescription medicines are available to treat congestion and runny nose. Your caregiver can make recommendations and may  suggest nasal or lung inhalers for other symptoms.  HOME CARE INSTRUCTIONS   Only take over-the-counter or prescription medicines for pain, discomfort, or fever as directed by your caregiver.  Use a warm mist humidifier or inhale steam from a shower to increase air moisture. This may keep secretions moist and make it easier to breathe.  Drink enough water and fluids to keep your urine clear or pale yellow.  Rest as needed.  Return to work when your temperature has returned to normal or as your caregiver advises. You may need to stay home longer to avoid infecting others. You can also use a face mask and careful hand washing to prevent spread of the virus. SEEK MEDICAL CARE IF:   After the first few days, you feel you are getting worse rather than better.  You need your caregiver's advice about medicines to control symptoms.  You develop chills, worsening shortness of breath, or brown or red sputum. These may be signs of pneumonia.  You develop yellow or brown nasal discharge or pain in the face, especially when you bend forward. These may be signs of sinusitis.  You develop a fever, swollen neck glands, pain with swallowing, or white areas in the back of your throat. These may be signs of strep throat. SEEK IMMEDIATE MEDICAL CARE IF:   You have a fever.  You develop severe or persistent headache, ear pain, sinus pain, or chest pain.  You develop wheezing, a prolonged cough, cough up blood, or have a change in your usual mucus (if you have chronic lung disease).  You develop sore muscles or a stiff neck. Document Released: 03/11/2001 Document Revised: 12/08/2011 Document Reviewed: 12/21/2013 [facility] Patient Information 2015 [place], [place]. This information is not intended to replace advice given to you by your health care provider. Make sure you discuss any questions you have with your health care provider.

## 2014-06-12 ENCOUNTER — Encounter: Payer: Self-pay | Admitting: Family Medicine

## 2014-06-12 DIAGNOSIS — J069 Acute upper respiratory infection, unspecified: Secondary | ICD-10-CM

## 2014-06-12 HISTORY — DX: Acute upper respiratory infection, unspecified: J06.9

## 2014-06-12 NOTE — Assessment & Plan Note (Signed)
Well controlled, no changes to meds. Encouraged heart healthy diet such as the DASH diet and exercise as tolerated.  °

## 2014-06-12 NOTE — Assessment & Plan Note (Signed)
Encouraged increased rest and hydration, add probiotics, zinc such as Coldeze or Xicam. Treat fevers as needed 

## 2014-06-12 NOTE — Assessment & Plan Note (Signed)
Encouraged DASH diet, decrease po intake and increase exercise as tolerated. Needs 7-8 hours of sleep nightly. Avoid trans fats, eat small, frequent meals every 4-5 hours with lean proteins, complex carbs and healthy fats. Minimize simple carbs, GMO foods. 

## 2014-06-12 NOTE — Progress Notes (Signed)
Patient ID: [name] [name], female   DOB: 26-Aug-1961, 53 y.o.   MRN: [id number] [name] [name] [id number] 16-Feb-1961 06/12/2014      Progress Note-Follow Up  Subjective  Chief Complaint  Chief Complaint  Patient presents with  . Follow-up    4 week    HPI  Patient is a 53 year old female in today for routine medical care. Followup. Generally doing well. He is struggling with some mild respiratory symptoms but they are improving. Had a sore throat but that is resolved. Now has a productive cough but this is also improving. Had some headache and congestion but this is improving. Cough is worse during the day. No nausea vomiting or diarrhea. Denies CP/palp/SOB/HA/congestion/fevers/GI or GU c/o. Taking meds as prescribed  Past Medical History  Diagnosis Date  . Ovarian cyst, bilateral   . Chronic back pain   . Morbid obesity   . Hypertension   . Anxiety and depression     chronic  . History of fibromyalgia     suspected   . Insomnia   . Depression with anxiety 08/23/2008    Qualifier: Diagnosis of  By: Niel Hummer MD, [place] PTSD (post-traumatic stress disorder)   . Acute upper respiratory infections of unspecified site 06/12/2014    Past Surgical History  Procedure Laterality Date  . Ovarian cyst removal      Family History  Problem Relation Age of Onset  . ADD / ADHD Neg Hx   . Depression Neg Hx   . Alcohol abuse Neg Hx   . Drug abuse Neg Hx     History   Social History  . Marital Status: Divorced    Spouse Name: N/A    Number of Children: N/A  . Years of Education: N/A   Occupational History  . Not on file.   Social History Main Topics  . Smoking status: Former Smoker    Quit date: 04/04/1989  . Smokeless tobacco: Never Used  . Alcohol Use: Yes     Comment: occassionally- 2 glassses of wine 2x/month  . Drug Use: No     Comment: take meds as prescribed  . Sexual Activity: Not on file   Other Topics Concern  . Not on file   Social History  Narrative  . No narrative on file    Current Outpatient Prescriptions on File Prior to Visit  Medication Sig Dispense Refill  . ALPRAZolam (XANAX) 1 MG tablet TAKE 1 TABLET BY MOUTH 3 TIMES A DAY AS NEEDED FOR ANXIETY  90 tablet  2  . beclomethasone (BECONASE-AQ) 42 MCG/SPRAY nasal spray Place 1 spray into both nostrils 2 (two) times daily. Dose is for each nostril.  25 g  1  . cyclobenzaprine (FLEXERIL) 10 MG tablet Take 1 tablet (10 mg total) by mouth 3 (three) times daily as needed for muscle spasms.  90 tablet  2  . metoprolol succinate (TOPROL-XL) 25 MG 24 hr tablet Take 1 tablet (25 mg total) by mouth daily.  90 tablet  1  . Oxycodone HCl 20 MG TABS Take 20 mg by mouth every 6 (six) hours as needed. For pain, not over 3 per day      . OxyCODONE HCl ER (OXYCONTIN) 30 MG T12A Take 1 tablet by mouth 2 (two) times daily.      Marland Kitchen triamterene-hydrochlorothiazide (DYAZIDE) 37.5-25 MG per capsule Take 1 each (1 capsule total) by mouth daily.  90 capsule  1  . zolpidem (AMBIEN)  10 MG tablet Take 1 tablet (10 mg total) by mouth at bedtime as needed for sleep.  30 tablet  2   No current facility-administered medications on file prior to visit.    Allergies  Allergen Reactions  . Cymbalta [Duloxetine Hcl] Other (See Comments)    Headaches  . Fiorinal [Butalbital-Aspirin-Caffeine] Other (See Comments)    Mental Clarity.    Review of Systems  Review of Systems  Constitutional: Positive for malaise/fatigue. Negative for fever.  HENT: Positive for congestion.   Eyes: Negative for discharge.  Respiratory: Positive for cough and sputum production. Negative for shortness of breath.   Cardiovascular: Negative for chest pain, palpitations and leg swelling.  Gastrointestinal: Negative for nausea, abdominal pain and diarrhea.  Genitourinary: Negative for dysuria.  Musculoskeletal: Negative for falls.  Skin: Negative for rash.  Neurological: Positive for headaches. Negative for loss of  consciousness.  Endo/Heme/Allergies: Negative for polydipsia.  Psychiatric/Behavioral: Negative for depression and suicidal ideas. The patient is not nervous/anxious and does not have insomnia.     Objective  BP 128/91  Pulse 60  Temp(Src) 98.3 F (36.8 C) (Oral)  Ht 5' 5.5" (1.664 m)  Wt 219 lb 6.4 oz (99.519 kg)  BMI 35.94 kg/m2  SpO2 97%  Physical Exam  Physical Exam  Constitutional: She is oriented to person, place, and time and well-developed, well-nourished, and in no distress. No distress.  HENT:  Head: Normocephalic and atraumatic.  Eyes: Conjunctivae are normal.  Neck: Neck supple. No thyromegaly present.  Cardiovascular: Normal rate, regular rhythm and normal heart sounds.   No murmur heard. Pulmonary/Chest: Effort normal and breath sounds normal. She has no wheezes.  Abdominal: She exhibits no distension and no mass.  Musculoskeletal: She exhibits no edema.  Lymphadenopathy:    She has no cervical adenopathy.  Neurological: She is alert and oriented to person, place, and time.  Skin: Skin is warm and dry. No rash noted. She is not diaphoretic.  Psychiatric: Memory, affect and judgment normal.    Lab Results  Component Value Date   TSH 4.096 03/06/2014   Lab Results  Component Value Date   WBC 9.7 05/05/2013   HGB 14.1 05/05/2013   HCT 41.8 05/05/2013   MCV 91.9 05/05/2013   PLT 285 05/05/2013   Lab Results  Component Value Date   CREATININE 0.47* 03/06/2014   BUN 13 03/06/2014   NA 137 03/06/2014   K 4.2 03/06/2014   CL 102 03/06/2014   CO2 28 03/06/2014   Lab Results  Component Value Date   ALT 20 03/06/2014   AST 17 03/06/2014   ALKPHOS 73 03/06/2014   BILITOT 0.3 03/06/2014   Lab Results  Component Value Date   CHOL 202* 03/06/2014   Lab Results  Component Value Date   HDL 53 03/06/2014   Lab Results  Component Value Date   LDLCALC 84 03/06/2014   Lab Results  Component Value Date   TRIG 326* 03/06/2014   Lab Results  Component Value Date   CHOLHDL 3.8 03/06/2014      Assessment & Plan  ESSENTIAL HYPERTENSION Well controlled, no changes to meds. Encouraged heart healthy diet such as the DASH diet and exercise as tolerated.   HYPERLIPIDEMIA Encouraged heart healthy diet, increase exercise, avoid trans fats, consider a krill oil cap daily  MORBID OBESITY Encouraged DASH diet, decrease po intake and increase exercise as tolerated. Needs 7-8 hours of sleep nightly. Avoid trans fats, eat small, frequent meals every 4-5 hours with lean  proteins, complex carbs and healthy fats. Minimize simple carbs, GMO foods.  ADD (attention deficit disorder) Doing well with Adderall given refill today  Acute upper respiratory infections of unspecified site Encouraged increased rest and hydration, add probiotics, zinc such as Coldeze or Xicam. Treat fevers as needed

## 2014-06-12 NOTE — Assessment & Plan Note (Signed)
Doing well with Adderall given refill today

## 2014-06-12 NOTE — Assessment & Plan Note (Signed)
Encouraged heart healthy diet, increase exercise, avoid trans fats, consider a krill oil cap daily 

## 2014-06-28 ENCOUNTER — Telehealth: Payer: Self-pay | Admitting: Family Medicine

## 2014-06-28 NOTE — Telephone Encounter (Signed)
Caller name: Birtie Relation to pt: self Call back number: [phone] Pharmacy:  Reason for call:   Patient would like to know if Dr. Charlett Blake would write a letter to excuse her of jury duty. She states that she has a condition that hinders her from sitting/standing too long

## 2014-06-29 NOTE — Telephone Encounter (Signed)
Please write her a note to excuse her from jury duty for numerous medical concerns, including significant pain

## 2014-06-29 NOTE — Telephone Encounter (Signed)
Please advise 

## 2014-06-30 NOTE — Telephone Encounter (Signed)
Pt informed that letter was ready and pt would like to pick it up today.  Letter put in misc basket for front desk to pick up

## 2014-07-06 ENCOUNTER — Ambulatory Visit (HOSPITAL_COMMUNITY): Payer: Self-pay | Admitting: Psychiatry

## 2014-07-11 ENCOUNTER — Other Ambulatory Visit: Payer: Self-pay | Admitting: Family Medicine

## 2014-07-28 ENCOUNTER — Ambulatory Visit: Payer: Managed Care, Other (non HMO) | Admitting: Family Medicine

## 2014-08-02 ENCOUNTER — Other Ambulatory Visit: Payer: Self-pay | Admitting: Family Medicine

## 2014-08-03 NOTE — Telephone Encounter (Signed)
rx printed for md to sign and fax 

## 2014-08-07 ENCOUNTER — Encounter: Payer: Self-pay | Admitting: Family Medicine

## 2014-08-07 ENCOUNTER — Ambulatory Visit (INDEPENDENT_AMBULATORY_CARE_PROVIDER_SITE_OTHER): Payer: Managed Care, Other (non HMO) | Admitting: Family Medicine

## 2014-08-07 VITALS — BP 133/79 | HR 76 | Temp 97.9°F | Ht 65.5 in | Wt 218.0 lb

## 2014-08-07 DIAGNOSIS — I10 Essential (primary) hypertension: Secondary | ICD-10-CM

## 2014-08-07 DIAGNOSIS — K5900 Constipation, unspecified: Secondary | ICD-10-CM

## 2014-08-07 DIAGNOSIS — E785 Hyperlipidemia, unspecified: Secondary | ICD-10-CM

## 2014-08-07 MED ORDER — AMPHETAMINE-DEXTROAMPHETAMINE 15 MG PO TABS: 15.0000 mg | ORAL_TABLET | Freq: Two times a day (BID) | ORAL | Status: DC

## 2014-08-07 NOTE — Patient Instructions (Signed)

## 2014-08-07 NOTE — Progress Notes (Signed)
Patient ID: [name] [name], female   DOB: [birth year], 53 y.o.   MRN: [id number] PRESTON WEILL [id number] 12-May-1961 08/07/2014      Progress Note-Follow Up  Subjective  Chief Complaint  Chief Complaint  Patient presents with  . Follow-up    HPI  Patient is a 53 year old female in today for routine medical care. Continues to struggle with chronic pain, notably in large joints and right foot s/p fracture. No recent illness. Under a great deal of stress. Denies CP/palp/SOB/HA/congestion/fevers/GI or GU c/o. Taking meds as prescribed  Past Medical History  Diagnosis Date  . Ovarian cyst, bilateral   . Chronic back pain   . Morbid obesity   . Hypertension   . Anxiety and depression     chronic  . History of fibromyalgia     suspected   . Insomnia   . Depression with anxiety 08/23/2008    Qualifier: Diagnosis of  By: Niel Hummer MD, [place] PTSD (post-traumatic stress disorder)   . Acute upper respiratory infections of unspecified site 06/12/2014    Past Surgical History  Procedure Laterality Date  . Ovarian cyst removal      Family History  Problem Relation Age of Onset  . ADD / ADHD Neg Hx   . Depression Neg Hx   . Alcohol abuse Neg Hx   . Drug abuse Neg Hx     History   Social History  . Marital Status: Divorced    Spouse Name: N/A    Number of Children: N/A  . Years of Education: N/A   Occupational History  . Not on file.   Social History Main Topics  . Smoking status: Former Smoker    Quit date: 04/04/1989  . Smokeless tobacco: Never Used  . Alcohol Use: Yes     Comment: occassionally- 2 glassses of wine 2x/month  . Drug Use: No     Comment: take meds as prescribed  . Sexual Activity: Not on file   Other Topics Concern  . Not on file   Social History Narrative    Current Outpatient Prescriptions on File Prior to Visit  Medication Sig Dispense Refill  . ALPRAZolam (XANAX) 1 MG tablet TAKE 1 TABLET 3 TIMES A DAY AS NEEDED FOR ANXIETY 90 tablet  2  . amphetamine-dextroamphetamine (ADDERALL) 20 MG tablet Take 1 tablet (20 mg total) by mouth 2 (two) times daily. October 2015 rx 60 tablet 0  . beclomethasone (BECONASE-AQ) 42 MCG/SPRAY nasal spray Place 1 spray into both nostrils 2 (two) times daily. Dose is for each nostril. 25 g 1  . cyclobenzaprine (FLEXERIL) 10 MG tablet Take 1 tablet (10 mg total) by mouth 3 (three) times daily as needed for muscle spasms. 90 tablet 2  . lisinopril (PRINIVIL,ZESTRIL) 10 MG tablet Take 1 tablet (10 mg total) by mouth daily. 90 tablet 1  . metoprolol succinate (TOPROL-XL) 25 MG 24 hr tablet Take 1 tablet (25 mg total) by mouth daily. 90 tablet 1  . Oxycodone HCl 20 MG TABS Take 20 mg by mouth every 6 (six) hours as needed. For pain, not over 3 per day    . OxyCODONE HCl ER (OXYCONTIN) 30 MG T12A Take 1 tablet by mouth 2 (two) times daily.    Marland Kitchen triamterene-hydrochlorothiazide (DYAZIDE) 37.5-25 MG per capsule TAKE 1 EACH (1 CAPSULE TOTAL) BY MOUTH EVERY MORNING. 30 capsule 3  . zolpidem (AMBIEN) 10 MG tablet Take 1 tablet (10 mg total) by mouth at  bedtime as needed for sleep. 30 tablet 2   No current facility-administered medications on file prior to visit.    Allergies  Allergen Reactions  . Cymbalta [Duloxetine Hcl] Other (See Comments)    Headaches  . Fiorinal [Butalbital-Aspirin-Caffeine] Other (See Comments)    Mental Clarity.    Review of Systems  Review of Systems  Constitutional: Negative for fever and malaise/fatigue.  HENT: Negative for congestion.   Eyes: Negative for discharge.  Respiratory: Negative for shortness of breath.   Cardiovascular: Negative for chest pain, palpitations and leg swelling.  Gastrointestinal: Negative for nausea, abdominal pain and diarrhea.  Genitourinary: Negative for dysuria.  Musculoskeletal: Positive for joint pain. Negative for falls.       Right foot pain s/p fracture of 1st MTR 1 month ago  Skin: Negative for rash.  Neurological: Negative for loss  of consciousness and headaches.  Endo/Heme/Allergies: Negative for polydipsia.  Psychiatric/Behavioral: Negative for depression and suicidal ideas. The patient is nervous/anxious. The patient does not have insomnia.     Objective  BP 133/79 mmHg  Pulse 76  Temp(Src) 97.9 F (36.6 C) (Oral)  Ht 5' 5.5" (1.664 m)  Wt 218 lb (98.884 kg)  BMI 35.71 kg/m2  SpO2 100%  Physical Exam  Physical Exam  Constitutional: She is oriented to person, place, and time and well-developed, well-nourished, and in no distress. No distress.  HENT:  Head: Normocephalic and atraumatic.  Eyes: Conjunctivae are normal.  Neck: Neck supple. No thyromegaly present.  Cardiovascular: Normal rate, regular rhythm and normal heart sounds.   No murmur heard. Pulmonary/Chest: Effort normal and breath sounds normal. She has no wheezes.  Abdominal: She exhibits no distension and no mass.  Musculoskeletal: She exhibits no edema.  Lymphadenopathy:    She has no cervical adenopathy.  Neurological: She is alert and oriented to person, place, and time.  Skin: Skin is warm and dry. No rash noted. She is not diaphoretic.  Psychiatric: Memory, affect and judgment normal.    Lab Results  Component Value Date   TSH 4.096 03/06/2014   Lab Results  Component Value Date   WBC 9.7 05/05/2013   HGB 14.1 05/05/2013   HCT 41.8 05/05/2013   MCV 91.9 05/05/2013   PLT 285 05/05/2013   Lab Results  Component Value Date   CREATININE 0.47* 03/06/2014   BUN 13 03/06/2014   NA 137 03/06/2014   K 4.2 03/06/2014   CL 102 03/06/2014   CO2 28 03/06/2014   Lab Results  Component Value Date   ALT 20 03/06/2014   AST 17 03/06/2014   ALKPHOS 73 03/06/2014   BILITOT 0.3 03/06/2014   Lab Results  Component Value Date   CHOL 202* 03/06/2014   Lab Results  Component Value Date   HDL 53 03/06/2014   Lab Results  Component Value Date   LDLCALC 84 03/06/2014   Lab Results  Component Value Date   TRIG 326* 03/06/2014    Lab Results  Component Value Date   CHOLHDL 3.8 03/06/2014     Assessment & Plan  Essential hypertension Well controlled, no changes to meds. Encouraged heart healthy diet such as the DASH diet and exercise as tolerated.   Constipation Encouraged increased hydration and fiber in diet. Daily probiotics. If bowels not moving can use MOM 2 tbls po in 4 oz of warm prune juice by mouth every 2-3 days. If no results then repeat in 4 hours with  Dulcolax suppository pr, may repeat again in 4  more hours as needed. Seek care if symptoms worsen. Consider daily Miralax and/or Dulcolax if symptoms persist.   MORBID OBESITY Encouraged DASH diet, decrease po intake and increase exercise as tolerated. Needs 7-8 hours of sleep nightly. Avoid trans fats, eat small, frequent meals every 4-5 hours with lean proteins, complex carbs and healthy fats. Minimize simple carbs  Hyperlipidemia Encouraged heart healthy diet, increase exercise, avoid trans fats, consider a krill oil cap daily

## 2014-08-07 NOTE — Progress Notes (Signed)
Pre visit review using our clinic review tool, if applicable. No additional management support is needed unless otherwise documented below in the visit note. 

## 2014-08-20 NOTE — Assessment & Plan Note (Signed)
Encouraged DASH diet, decrease po intake and increase exercise as tolerated. Needs 7-8 hours of sleep nightly. Avoid trans fats, eat small, frequent meals every 4-5 hours with lean proteins, complex carbs and healthy fats. Minimize simple carbs 

## 2014-08-20 NOTE — Assessment & Plan Note (Signed)
Well controlled, no changes to meds. Encouraged heart healthy diet such as the DASH diet and exercise as tolerated.  °

## 2014-08-20 NOTE — Assessment & Plan Note (Signed)
Encouraged heart healthy diet, increase exercise, avoid trans fats, consider a krill oil cap daily 

## 2014-08-20 NOTE — Assessment & Plan Note (Signed)
Encouraged increased hydration and fiber in diet. Daily probiotics. If bowels not moving can use MOM 2 tbls po in 4 oz of warm prune juice by mouth every 2-3 days. If no results then repeat in 4 hours with  Dulcolax suppository pr, may repeat again in 4 more hours as needed. Seek care if symptoms worsen. Consider daily Miralax and/or Dulcolax if symptoms persist.  

## 2014-08-31 ENCOUNTER — Other Ambulatory Visit: Payer: Self-pay | Admitting: Family Medicine

## 2014-08-31 NOTE — Telephone Encounter (Signed)
rx printed for md to sign tomorrow and fax

## 2014-10-27 ENCOUNTER — Other Ambulatory Visit: Payer: Self-pay | Admitting: Family Medicine

## 2014-10-30 ENCOUNTER — Other Ambulatory Visit: Payer: Self-pay | Admitting: Family Medicine

## 2014-10-30 DIAGNOSIS — I10 Essential (primary) hypertension: Secondary | ICD-10-CM

## 2014-10-30 NOTE — Telephone Encounter (Signed)
Caller name: Zamiyah, Resendes Relation to pt: self  Call back number: [phone] Pharmacy: [id number] - [place], [place] - [address] AT [address] [phone] (Phone) [phone] (Fax)     Reason for call:  ALPRAZolam Duanne Moron) 1 MG tablet  lisinopril (PRINIVIL,ZESTRIL) 10 MG tablet  metoprolol succinate (TOPROL-XL) 25 MG 24 hr tablet triamterene-hydrochlorothiazide (DYAZIDE) 37.5-25 MG per capsule

## 2014-11-01 MED ORDER — ALPRAZOLAM 1 MG PO TABS: ORAL_TABLET | ORAL | Status: DC

## 2014-11-01 MED ORDER — LISINOPRIL 10 MG PO TABS: 10.0000 mg | ORAL_TABLET | Freq: Every day | ORAL | Status: DC

## 2014-11-01 NOTE — Addendum Note (Signed)
Addended by: Rockwell Germany on: 11/01/2014 12:02 PM   Modules accepted: Orders

## 2014-11-01 NOTE — Telephone Encounter (Signed)
FYI: NEVER RECEIVED PRINTED SCRIPT VIA MACHINE 02.03.16 in office; phoned in Rx to pharmacy/SLS

## 2014-11-01 NOTE — Telephone Encounter (Signed)
  Medication Detail      Disp Refills Start End     ALPRAZolam (XANAX) 1 MG tablet 90 tablet 2 08/03/2014     Sig: TAKE 1 TABLET 3 TIMES A DAY AS NEEDED FOR ANXIETY    Class: Print    Notes to Pharmacy: Not to exceed 5 additional fills before 10/29/2014     Pharmacy     All other prescriptions taken care of with the exception of the Alprazolam 1 mg/SLS Last filled 11.05.15, #90x2 Please Advise on refills/SLS

## 2014-11-01 NOTE — Telephone Encounter (Signed)
I will print now and sign in am

## 2014-11-09 ENCOUNTER — Encounter: Payer: Self-pay | Admitting: Family Medicine

## 2014-11-09 ENCOUNTER — Ambulatory Visit (INDEPENDENT_AMBULATORY_CARE_PROVIDER_SITE_OTHER): Payer: Managed Care, Other (non HMO) | Admitting: Family Medicine

## 2014-11-09 VITALS — BP 122/72 | HR 87 | Temp 97.8°F | Ht 65.0 in | Wt 219.5 lb

## 2014-11-09 DIAGNOSIS — F909 Attention-deficit hyperactivity disorder, unspecified type: Secondary | ICD-10-CM

## 2014-11-09 DIAGNOSIS — I10 Essential (primary) hypertension: Secondary | ICD-10-CM

## 2014-11-09 DIAGNOSIS — F418 Other specified anxiety disorders: Secondary | ICD-10-CM

## 2014-11-09 DIAGNOSIS — F4310 Post-traumatic stress disorder, unspecified: Secondary | ICD-10-CM

## 2014-11-09 DIAGNOSIS — F988 Other specified behavioral and emotional disorders with onset usually occurring in childhood and adolescence: Secondary | ICD-10-CM

## 2014-11-09 MED ORDER — SERTRALINE HCL 50 MG PO TABS: 50.0000 mg | ORAL_TABLET | Freq: Every day | ORAL | Status: DC

## 2014-11-09 NOTE — Patient Instructions (Signed)

## 2014-11-22 NOTE — Assessment & Plan Note (Signed)
Encouraged DASH diet, decrease po intake and increase exercise as tolerated. Needs 7-8 hours of sleep nightly. Avoid trans fats, eat small, frequent meals every 4-5 hours with lean proteins, complex carbs and healthy fats. Minimize simple carbs, GMO foods. 

## 2014-11-22 NOTE — Assessment & Plan Note (Addendum)
Sertraline daily is started today.

## 2014-11-22 NOTE — Assessment & Plan Note (Signed)
Well controlled, no changes to meds. Encouraged heart healthy diet such as the DASH diet and exercise as tolerated.  °

## 2014-11-22 NOTE — Assessment & Plan Note (Addendum)
Has stopped Adderall and is doing well. Will not restart at this time

## 2014-11-22 NOTE — Progress Notes (Signed)
[name] [name]  [id number] 15-Jan-1961 11/22/2014      Progress Note-Follow Up  Subjective  Chief Complaint  Chief Complaint  Patient presents with  . Follow-up    HPI  Patient is a 54 y.o. female in today for routine medical care. Doing well at the present physically. Stopped Adderall and is doing well without it. Is struggling with increased anxiety and anhedonia, used Fluoxetine in past without good results. No recent illness. Denies CP/palp/SOB/HA/congestion/fevers/GI or GU c/o. Taking meds as prescribed.  Past Medical History  Diagnosis Date  . Ovarian cyst, bilateral   . Chronic back pain   . Morbid obesity   . Hypertension   . Anxiety and depression     chronic  . History of fibromyalgia     suspected   . Insomnia   . Depression with anxiety 08/23/2008    Qualifier: Diagnosis of  By: Niel Hummer MD, [place] PTSD (post-traumatic stress disorder)   . Acute upper respiratory infections of unspecified site 06/12/2014    Past Surgical History  Procedure Laterality Date  . Ovarian cyst removal      Family History  Problem Relation Age of Onset  . ADD / ADHD Neg Hx   . Depression Neg Hx   . Alcohol abuse Neg Hx   . Drug abuse Neg Hx     History   Social History  . Marital Status: Divorced    Spouse Name: N/A  . Number of Children: N/A  . Years of Education: N/A   Occupational History  . Not on file.   Social History Main Topics  . Smoking status: Former Smoker    Quit date: 04/04/1989  . Smokeless tobacco: Never Used  . Alcohol Use: Yes     Comment: occassionally- 2 glassses of wine 2x/month  . Drug Use: No     Comment: take meds as prescribed  . Sexual Activity: Not on file   Other Topics Concern  . Not on file   Social History Narrative    Current Outpatient Prescriptions on File Prior to Visit  Medication Sig Dispense Refill  . ALPRAZolam (XANAX) 1 MG tablet TAKE 1 TABLET BY MOUTH 3 TIMES A DAY AS NEEDED FOR ANXIETY 90 tablet 2  .  beclomethasone (BECONASE-AQ) 42 MCG/SPRAY nasal spray Place 1 spray into both nostrils 2 (two) times daily. Dose is for each nostril. 25 g 1  . lisinopril (PRINIVIL,ZESTRIL) 10 MG tablet Take 1 tablet (10 mg total) by mouth daily. 90 tablet 1  . metoprolol succinate (TOPROL-XL) 25 MG 24 hr tablet Take 1 tablet (25 mg total) by mouth daily. 90 tablet 1  . Oxycodone HCl 20 MG TABS Take 20 mg by mouth every 6 (six) hours as needed. For pain, not over 3 per day    . OxyCODONE HCl ER (OXYCONTIN) 30 MG T12A Take 1 tablet by mouth 2 (two) times daily.    Marland Kitchen triamterene-hydrochlorothiazide (DYAZIDE) 37.5-25 MG per capsule TAKE 1 EACH (1 CAPSULE TOTAL) BY MOUTH EVERY MORNING. 30 capsule 3  . zolpidem (AMBIEN) 10 MG tablet TAKE 1 TABLET BY MOUTH AT BEDTIME AS NEEED FOR SLEEP 30 tablet 2   No current facility-administered medications on file prior to visit.    Allergies  Allergen Reactions  . Cymbalta [Duloxetine Hcl] Other (See Comments)    Headaches  . Fiorinal [Butalbital-Aspirin-Caffeine] Other (See Comments)    Mental Clarity.    Review of Systems  ROS  Objective  BP  122/72 mmHg  Pulse 87  Temp(Src) 97.8 F (36.6 C) (Oral)  Ht 5\' 5"  (1.651 m)  Wt 219 lb 8 oz (99.565 kg)  BMI 36.53 kg/m2  SpO2 96%  Physical Exam  Physical Exam  Lab Results  Component Value Date   TSH 4.096 03/06/2014   Lab Results  Component Value Date   WBC 9.7 05/05/2013   HGB 14.1 05/05/2013   HCT 41.8 05/05/2013   MCV 91.9 05/05/2013   PLT 285 05/05/2013   Lab Results  Component Value Date   CREATININE 0.47* 03/06/2014   BUN 13 03/06/2014   NA 137 03/06/2014   K 4.2 03/06/2014   CL 102 03/06/2014   CO2 28 03/06/2014   Lab Results  Component Value Date   ALT 20 03/06/2014   AST 17 03/06/2014   ALKPHOS 73 03/06/2014   BILITOT 0.3 03/06/2014   Lab Results  Component Value Date   CHOL 202* 03/06/2014   Lab Results  Component Value Date   HDL 53 03/06/2014   Lab Results  Component  Value Date   LDLCALC 84 03/06/2014   Lab Results  Component Value Date   TRIG 326* 03/06/2014   Lab Results  Component Value Date   CHOLHDL 3.8 03/06/2014     Assessment & Plan  Essential hypertension Well controlled, no changes to meds. Encouraged heart healthy diet such as the DASH diet and exercise as tolerated.    MORBID OBESITY Encouraged DASH diet, decrease po intake and increase exercise as tolerated. Needs 7-8 hours of sleep nightly. Avoid trans fats, eat small, frequent meals every 4-5 hours with lean proteins, complex carbs and healthy fats. Minimize simple carbs, GMO foods.   ADD (attention deficit disorder) Has stopped Adderall and is doing well. Will not restart at this time   PTSD (post-traumatic stress disorder) Sertraline daily is started today.

## 2014-11-30 ENCOUNTER — Telehealth: Payer: Self-pay | Admitting: Family Medicine

## 2014-11-30 NOTE — Telephone Encounter (Signed)
Yes OK to switch to tabs

## 2014-11-30 NOTE — Telephone Encounter (Signed)
Triamterene-hydrochlorothiazide 7.5-25 mg  CAPSULE is on back order, is it ok to change to Tabs??

## 2014-12-01 MED ORDER — TRIAMTERENE-HCTZ 37.5-25 MG PO TABS: 1.0000 | ORAL_TABLET | Freq: Every day | ORAL | Status: DC

## 2014-12-01 NOTE — Telephone Encounter (Signed)
Changed in medication list to tablet and sent in to pharmacy

## 2015-01-03 ENCOUNTER — Telehealth: Payer: Self-pay | Admitting: Family Medicine

## 2015-01-03 DIAGNOSIS — F988 Other specified behavioral and emotional disorders with onset usually occurring in childhood and adolescence: Secondary | ICD-10-CM

## 2015-01-03 NOTE — Telephone Encounter (Signed)
OK to restart the Adderall 20 mg tabs 1 tab po bid disp #60

## 2015-01-03 NOTE — Telephone Encounter (Signed)
Please advise 

## 2015-01-03 NOTE — Telephone Encounter (Signed)
Caller name: Candace Relation to pt: self Call back number:  [phone] Pharmacy:  Reason for call:   Patient states that she would like to go back on Adderall. She thought that the pharmacy had one refill left but they do not and is requesting a new rx.

## 2015-01-04 MED ORDER — AMPHETAMINE-DEXTROAMPHETAMINE 20 MG PO TABS: 20.0000 mg | ORAL_TABLET | Freq: Two times a day (BID) | ORAL | Status: DC

## 2015-01-04 NOTE — Telephone Encounter (Signed)
Spoke with Pt, informed her that Rx is ready for pick up at front desk.

## 2015-01-04 NOTE — Telephone Encounter (Signed)
Rx printed, awaiting MD signature.  

## 2015-01-27 ENCOUNTER — Other Ambulatory Visit: Payer: Self-pay | Admitting: Family Medicine

## 2015-01-29 NOTE — Telephone Encounter (Signed)
[facility] [place]

## 2015-01-29 NOTE — Telephone Encounter (Signed)
[facility]

## 2015-02-05 ENCOUNTER — Ambulatory Visit: Payer: Managed Care, Other (non HMO) | Admitting: Family Medicine

## 2015-02-12 ENCOUNTER — Ambulatory Visit: Payer: Self-pay | Admitting: Family Medicine

## 2015-02-22 ENCOUNTER — Encounter: Payer: Self-pay | Admitting: Family Medicine

## 2015-02-22 ENCOUNTER — Ambulatory Visit (INDEPENDENT_AMBULATORY_CARE_PROVIDER_SITE_OTHER): Payer: Managed Care, Other (non HMO) | Admitting: Family Medicine

## 2015-02-22 VITALS — BP 120/62 | HR 86 | Temp 98.3°F | Ht 65.0 in | Wt 229.4 lb

## 2015-02-22 DIAGNOSIS — F909 Attention-deficit hyperactivity disorder, unspecified type: Secondary | ICD-10-CM | POA: Diagnosis not present

## 2015-02-22 DIAGNOSIS — K5900 Constipation, unspecified: Secondary | ICD-10-CM

## 2015-02-22 DIAGNOSIS — E785 Hyperlipidemia, unspecified: Secondary | ICD-10-CM | POA: Diagnosis not present

## 2015-02-22 DIAGNOSIS — F418 Other specified anxiety disorders: Secondary | ICD-10-CM

## 2015-02-22 DIAGNOSIS — G4700 Insomnia, unspecified: Secondary | ICD-10-CM

## 2015-02-22 DIAGNOSIS — I10 Essential (primary) hypertension: Secondary | ICD-10-CM | POA: Diagnosis not present

## 2015-02-22 DIAGNOSIS — R739 Hyperglycemia, unspecified: Secondary | ICD-10-CM

## 2015-02-22 DIAGNOSIS — F988 Other specified behavioral and emotional disorders with onset usually occurring in childhood and adolescence: Secondary | ICD-10-CM

## 2015-02-22 MED ORDER — SERTRALINE HCL 50 MG PO TABS: 50.0000 mg | ORAL_TABLET | Freq: Every day | ORAL | Status: DC

## 2015-02-22 MED ORDER — ZOLPIDEM TARTRATE 10 MG PO TABS: 10.0000 mg | ORAL_TABLET | Freq: Every evening | ORAL | Status: DC | PRN

## 2015-02-22 MED ORDER — AMPHETAMINE-DEXTROAMPHETAMINE 20 MG PO TABS: 20.0000 mg | ORAL_TABLET | Freq: Two times a day (BID) | ORAL | Status: DC

## 2015-02-22 MED ORDER — ALPRAZOLAM 1 MG PO TABS: 1.0000 mg | ORAL_TABLET | Freq: Three times a day (TID) | ORAL | Status: DC | PRN

## 2015-02-22 MED ORDER — SERTRALINE HCL 100 MG PO TABS: 100.0000 mg | ORAL_TABLET | Freq: Every day | ORAL | Status: DC

## 2015-02-22 NOTE — Progress Notes (Signed)
Pre visit review using our clinic review tool, if applicable. No additional management support is needed unless otherwise documented below in the visit note. 

## 2015-03-04 ENCOUNTER — Encounter: Payer: Self-pay | Admitting: Family Medicine

## 2015-03-04 NOTE — Assessment & Plan Note (Signed)
Well controlled, no changes to meds. Encouraged heart healthy diet such as the DASH diet and exercise as tolerated.  °

## 2015-03-04 NOTE — Assessment & Plan Note (Signed)
Encouraged good sleep hygiene such as dark, quiet room. No blue/green glowing lights such as computer screens in bedroom. No alcohol or stimulants in evening. Cut down on caffeine as able. Regular exercise is helpful but not just prior to bed time. May continue Ambien prn

## 2015-03-04 NOTE — Assessment & Plan Note (Signed)
Tolerating Adderall and continue same

## 2015-03-04 NOTE — Assessment & Plan Note (Signed)
Encouraged increased hydration and fiber in diet. Daily probiotics. If bowels not moving can use MOM 2 tbls po in 4 oz of warm prune juice by mouth every 2-3 days. If no results then repeat in 4 hours with  Dulcolax suppository pr, may repeat again in 4 more hours as needed. Seek care if symptoms worsen. Consider daily Miralax and/or Dulcolax if symptoms persist.  

## 2015-03-04 NOTE — Assessment & Plan Note (Signed)
Encouraged DASH diet, decrease po intake and increase exercise as tolerated. Needs 7-8 hours of sleep nightly. Avoid trans fats, eat small, frequent meals every 4-5 hours with lean proteins, complex carbs and healthy fats. Minimize simple carbs, GMO foods. 

## 2015-03-04 NOTE — Progress Notes (Signed)
[name] [name]  [id number] [birth year] 03/04/2015      Progress Note-Follow Up  Subjective  Chief Complaint  Chief Complaint  Patient presents with  . Follow-up    HPI  Patient is a 54 y.o. female in today for routine medical care. Is requesting refills on several meds. Acknowledges she still struggling with high anxiety and stress. Patient in today for follow-up. She overall is doing well but acknowledges persistent insomnia, back pain, neck pain and joint pain. No recent acute illness. Denies CP/palp/SOB/HA/congestion/fevers/GI or GU c/o. Taking meds as prescribed  Past Medical History  Diagnosis Date  . Ovarian cyst, bilateral   . Chronic back pain   . Morbid obesity   . Hypertension   . Anxiety and depression     chronic  . History of fibromyalgia     suspected   . Insomnia   . Depression with anxiety 08/23/2008    Qualifier: Diagnosis of  By: Niel Hummer MD, [place] PTSD (post-traumatic stress disorder)   . Acute upper respiratory infections of unspecified site 06/12/2014    Past Surgical History  Procedure Laterality Date  . Ovarian cyst removal      Family History  Problem Relation Age of Onset  . ADD / ADHD Neg Hx   . Depression Neg Hx   . Alcohol abuse Neg Hx   . Drug abuse Neg Hx     History   Social History  . Marital Status: Divorced    Spouse Name: N/A  . Number of Children: N/A  . Years of Education: N/A   Occupational History  . Not on file.   Social History Main Topics  . Smoking status: Former Smoker    Quit date: 04/04/1989  . Smokeless tobacco: Never Used  . Alcohol Use: Yes     Comment: occassionally- 2 glassses of wine 2x/month  . Drug Use: No     Comment: take meds as prescribed  . Sexual Activity: Not on file   Other Topics Concern  . Not on file   Social History Narrative    Current Outpatient Prescriptions on File Prior to Visit  Medication Sig Dispense Refill  . beclomethasone (BECONASE-AQ) 42 MCG/SPRAY nasal  spray Place 1 spray into both nostrils 2 (two) times daily. Dose is for each nostril. 25 g 1  . lisinopril (PRINIVIL,ZESTRIL) 10 MG tablet Take 1 tablet (10 mg total) by mouth daily. 90 tablet 1  . metoprolol succinate (TOPROL-XL) 25 MG 24 hr tablet Take 1 tablet (25 mg total) by mouth daily. 90 tablet 1  . Oxycodone HCl 20 MG TABS Take 20 mg by mouth every 6 (six) hours as needed. For pain, not over 3 per day    . triamterene-hydrochlorothiazide (MAXZIDE-25) 37.5-25 MG per tablet TAKE 1 TABLET BY MOUTH DAILY. 30 tablet 4   No current facility-administered medications on file prior to visit.    Allergies  Allergen Reactions  . Cymbalta [Duloxetine Hcl] Other (See Comments)    Headaches  . Fiorinal [Butalbital-Aspirin-Caffeine] Other (See Comments)    Mental Clarity.    Review of Systems  Review of Systems  Constitutional: Negative for fever and malaise/fatigue.  HENT: Negative for congestion.   Eyes: Negative for discharge.  Respiratory: Negative for shortness of breath.   Cardiovascular: Negative for chest pain, palpitations and leg swelling.  Gastrointestinal: Negative for nausea, abdominal pain and diarrhea.  Genitourinary: Negative for dysuria.  Musculoskeletal: Positive for back pain, joint pain and neck pain.  Negative for falls.  Skin: Negative for rash.  Neurological: Negative for loss of consciousness and headaches.  Endo/Heme/Allergies: Negative for polydipsia.  Psychiatric/Behavioral: Positive for depression. Negative for suicidal ideas. The patient is nervous/anxious and has insomnia.     Objective  BP 120/62 mmHg  Pulse 86  Temp(Src) 98.3 F (36.8 C) (Oral)  Ht 5\' 5"  (1.651 m)  Wt 229 lb 6 oz (104.044 kg)  BMI 38.17 kg/m2  SpO2 95%  Physical Exam  Physical Exam  Constitutional: She is oriented to person, place, and time and well-developed, well-nourished, and in no distress. No distress.  HENT:  Head: Normocephalic and atraumatic.  Right Ear: External  ear normal.  Left Ear: External ear normal.  Nose: Nose normal.  Mouth/Throat: Oropharynx is clear and moist. No oropharyngeal exudate.  Eyes: Conjunctivae are normal. Pupils are equal, round, and reactive to light. Right eye exhibits no discharge. Left eye exhibits no discharge. No scleral icterus.  Neck: Normal range of motion. Neck supple. No thyromegaly present.  Cardiovascular: Normal rate, regular rhythm, normal heart sounds and intact distal pulses.   No murmur heard. Pulmonary/Chest: Effort normal and breath sounds normal. No respiratory distress. She has no wheezes. She has no rales.  Abdominal: Soft. Bowel sounds are normal. She exhibits no distension and no mass. There is no tenderness.  Musculoskeletal: Normal range of motion. She exhibits no edema or tenderness.  Lymphadenopathy:    She has no cervical adenopathy.  Neurological: She is alert and oriented to person, place, and time. She has normal reflexes. No cranial nerve deficit. Coordination normal.  Skin: Skin is warm and dry. No rash noted. She is not diaphoretic.  Psychiatric: Mood, memory and affect normal.    Lab Results  Component Value Date   TSH 4.096 03/06/2014   Lab Results  Component Value Date   WBC 9.7 05/05/2013   HGB 14.1 05/05/2013   HCT 41.8 05/05/2013   MCV 91.9 05/05/2013   PLT 285 05/05/2013   Lab Results  Component Value Date   CREATININE 0.47* 03/06/2014   BUN 13 03/06/2014   NA 137 03/06/2014   K 4.2 03/06/2014   CL 102 03/06/2014   CO2 28 03/06/2014   Lab Results  Component Value Date   ALT 20 03/06/2014   AST 17 03/06/2014   ALKPHOS 73 03/06/2014   BILITOT 0.3 03/06/2014   Lab Results  Component Value Date   CHOL 202* 03/06/2014   Lab Results  Component Value Date   HDL 53 03/06/2014   Lab Results  Component Value Date   LDLCALC 84 03/06/2014   Lab Results  Component Value Date   TRIG 326* 03/06/2014   Lab Results  Component Value Date   CHOLHDL 3.8 03/06/2014      Assessment & Plan  Essential hypertension Well controlled, no changes to meds. Encouraged heart healthy diet such as the DASH diet and exercise as tolerated.    Constipation Encouraged increased hydration and fiber in diet. Daily probiotics. If bowels not moving can use MOM 2 tbls po in 4 oz of warm prune juice by mouth every 2-3 days. If no results then repeat in 4 hours with  Dulcolax suppository pr, may repeat again in 4 more hours as needed. Seek care if symptoms worsen. Consider daily Miralax and/or Dulcolax if symptoms persist.    Depression with anxiety Increase sertraline to 100 mg daily and may use Xanax prn   Insomnia Encouraged good sleep hygiene such as dark, quiet room.  No blue/green glowing lights such as computer screens in bedroom. No alcohol or stimulants in evening. Cut down on caffeine as able. Regular exercise is helpful but not just prior to bed time. May continue Ambien prn   MORBID OBESITY Encouraged DASH diet, decrease po intake and increase exercise as tolerated. Needs 7-8 hours of sleep nightly. Avoid trans fats, eat small, frequent meals every 4-5 hours with lean proteins, complex carbs and healthy fats. Minimize simple carbs, GMO foods.   ADD (attention deficit disorder) Tolerating Adderall and continue same

## 2015-03-04 NOTE — Assessment & Plan Note (Signed)
Increase sertraline to 100 mg daily and may use Xanax prn

## 2015-04-24 ENCOUNTER — Other Ambulatory Visit: Payer: Self-pay | Admitting: Family Medicine

## 2015-05-18 ENCOUNTER — Telehealth: Payer: Self-pay | Admitting: Family Medicine

## 2015-05-18 NOTE — Telephone Encounter (Signed)
pre visit letter mailed 05/18/15

## 2015-05-27 ENCOUNTER — Other Ambulatory Visit: Payer: Self-pay | Admitting: Family Medicine

## 2015-05-28 NOTE — Telephone Encounter (Signed)
Faxed hardcopy for Alprazolam to [facility]

## 2015-06-01 ENCOUNTER — Other Ambulatory Visit: Payer: Managed Care, Other (non HMO)

## 2015-06-08 ENCOUNTER — Encounter: Payer: Managed Care, Other (non HMO) | Admitting: Family Medicine

## 2015-06-11 ENCOUNTER — Ambulatory Visit (INDEPENDENT_AMBULATORY_CARE_PROVIDER_SITE_OTHER): Payer: Managed Care, Other (non HMO) | Admitting: Family Medicine

## 2015-06-11 ENCOUNTER — Encounter: Payer: Self-pay | Admitting: Family Medicine

## 2015-06-11 VITALS — BP 104/62 | HR 74 | Temp 97.9°F | Resp 16 | Ht 65.0 in | Wt 241.2 lb

## 2015-06-11 DIAGNOSIS — R7989 Other specified abnormal findings of blood chemistry: Secondary | ICD-10-CM

## 2015-06-11 DIAGNOSIS — E785 Hyperlipidemia, unspecified: Secondary | ICD-10-CM

## 2015-06-11 DIAGNOSIS — R945 Abnormal results of liver function studies: Secondary | ICD-10-CM

## 2015-06-11 DIAGNOSIS — M791 Myalgia: Secondary | ICD-10-CM

## 2015-06-11 DIAGNOSIS — I10 Essential (primary) hypertension: Secondary | ICD-10-CM

## 2015-06-11 DIAGNOSIS — M129 Arthropathy, unspecified: Secondary | ICD-10-CM | POA: Diagnosis not present

## 2015-06-11 DIAGNOSIS — IMO0001 Reserved for inherently not codable concepts without codable children: Secondary | ICD-10-CM

## 2015-06-11 DIAGNOSIS — M609 Myositis, unspecified: Secondary | ICD-10-CM

## 2015-06-11 DIAGNOSIS — Z0000 Encounter for general adult medical examination without abnormal findings: Secondary | ICD-10-CM | POA: Diagnosis not present

## 2015-06-11 DIAGNOSIS — E039 Hypothyroidism, unspecified: Secondary | ICD-10-CM | POA: Diagnosis not present

## 2015-06-11 DIAGNOSIS — M25561 Pain in right knee: Secondary | ICD-10-CM

## 2015-06-11 DIAGNOSIS — IMO0002 Reserved for concepts with insufficient information to code with codable children: Secondary | ICD-10-CM

## 2015-06-11 DIAGNOSIS — R739 Hyperglycemia, unspecified: Secondary | ICD-10-CM

## 2015-06-11 DIAGNOSIS — E559 Vitamin D deficiency, unspecified: Secondary | ICD-10-CM

## 2015-06-11 DIAGNOSIS — M1710 Unilateral primary osteoarthritis, unspecified knee: Secondary | ICD-10-CM

## 2015-06-11 LAB — RHEUMATOID FACTOR

## 2015-06-11 NOTE — Progress Notes (Signed)
Pre visit review using our clinic review tool, if applicable. No additional management support is needed unless otherwise documented below in the visit note. 

## 2015-06-11 NOTE — Patient Instructions (Signed)
Preventive Care for Adults A healthy lifestyle and preventive care can promote health and wellness. Preventive health guidelines for women include the following key practices.  A routine yearly physical is a good way to check with your health care provider about your health and preventive screening. It is a chance to share any concerns and updates on your health and to receive a thorough exam.  Visit your dentist for a routine exam and preventive care every 6 months. Brush your teeth twice a day and floss once a day. Good oral hygiene prevents tooth decay and gum disease.  The frequency of eye exams is based on your age, health, family medical history, use of contact lenses, and other factors. Follow your health care provider's recommendations for frequency of eye exams.  Eat a healthy diet. Foods like vegetables, fruits, whole grains, low-fat dairy products, and lean protein foods contain the nutrients you need without too many calories. Decrease your intake of foods high in solid fats, added sugars, and salt. Eat the right amount of calories for you.Get information about a proper diet from your health care provider, if necessary.  Regular physical exercise is one of the most important things you can do for your health. Most adults should get at least 150 minutes of moderate-intensity exercise (any activity that increases your heart rate and causes you to sweat) each week. In addition, most adults need muscle-strengthening exercises on 2 or more days a week.  Maintain a healthy weight. The body mass index (BMI) is a screening tool to identify possible weight problems. It provides an estimate of body fat based on height and weight. Your health care provider can find your BMI and can help you achieve or maintain a healthy weight.For adults 20 years and older:  A BMI below 18.5 is considered underweight.  A BMI of 18.5 to 24.9 is normal.  A BMI of 25 to 29.9 is considered overweight.  A BMI of  30 and above is considered obese.  Maintain normal blood lipids and cholesterol levels by exercising and minimizing your intake of saturated fat. Eat a balanced diet with plenty of fruit and vegetables. Blood tests for lipids and cholesterol should begin at age 76 and be repeated every 5 years. If your lipid or cholesterol levels are high, you are over 50, or you are at high risk for heart disease, you may need your cholesterol levels checked more frequently.Ongoing high lipid and cholesterol levels should be treated with medicines if diet and exercise are not working.  If you smoke, find out from your health care provider how to quit. If you do not use tobacco, do not start.  Lung cancer screening is recommended for adults aged 22-80 years who are at high risk for developing lung cancer because of a history of smoking. A yearly low-dose CT scan of the lungs is recommended for people who have at least a 30-pack-year history of smoking and are a current smoker or have quit within the past 15 years. A pack year of smoking is smoking an average of 1 pack of cigarettes a day for 1 year (for example: 1 pack a day for 30 years or 2 packs a day for 15 years). Yearly screening should continue until the smoker has stopped smoking for at least 15 years. Yearly screening should be stopped for people who develop a health problem that would prevent them from having lung cancer treatment.  If you are pregnant, do not drink alcohol. If you are breastfeeding,  be very cautious about drinking alcohol. If you are not pregnant and choose to drink alcohol, do not have more than 1 drink per day. One drink is considered to be 12 ounces (355 mL) of beer, 5 ounces (148 mL) of wine, or 1.5 ounces (44 mL) of liquor.  Avoid use of street drugs. Do not share needles with anyone. Ask for help if you need support or instructions about stopping the use of drugs.  High blood pressure causes heart disease and increases the risk of  stroke. Your blood pressure should be checked at least every 1 to 2 years. Ongoing high blood pressure should be treated with medicines if weight loss and exercise do not work.  If you are 75-52 years old, ask your health care provider if you should take aspirin to prevent strokes.  Diabetes screening involves taking a blood sample to check your fasting blood sugar level. This should be done once every 3 years, after age 15, if you are within normal weight and without risk factors for diabetes. Testing should be considered at a younger age or be carried out more frequently if you are overweight and have at least 1 risk factor for diabetes.  Breast cancer screening is essential preventive care for women. You should practice "breast self-awareness." This means understanding the normal appearance and feel of your breasts and may include breast self-examination. Any changes detected, no matter how small, should be reported to a health care provider. Women in their 58s and 30s should have a clinical breast exam (CBE) by a health care provider as part of a regular health exam every 1 to 3 years. After age 16, women should have a CBE every year. Starting at age 53, women should consider having a mammogram (breast X-ray test) every year. Women who have a family history of breast cancer should talk to their health care provider about genetic screening. Women at a high risk of breast cancer should talk to their health care providers about having an MRI and a mammogram every year.  Breast cancer gene (BRCA)-related cancer risk assessment is recommended for women who have family members with BRCA-related cancers. BRCA-related cancers include breast, ovarian, tubal, and peritoneal cancers. Having family members with these cancers may be associated with an increased risk for harmful changes (mutations) in the breast cancer genes BRCA1 and BRCA2. Results of the assessment will determine the need for genetic counseling and  BRCA1 and BRCA2 testing.  Routine pelvic exams to screen for cancer are no longer recommended for nonpregnant women who are considered low risk for cancer of the pelvic organs (ovaries, uterus, and vagina) and who do not have symptoms. Ask your health care provider if a screening pelvic exam is right for you.  If you have had past treatment for cervical cancer or a condition that could lead to cancer, you need Pap tests and screening for cancer for at least 20 years after your treatment. If Pap tests have been discontinued, your risk factors (such as having a new sexual partner) need to be reassessed to determine if screening should be resumed. Some women have medical problems that increase the chance of getting cervical cancer. In these cases, your health care provider may recommend more frequent screening and Pap tests.  The HPV test is an additional test that may be used for cervical cancer screening. The HPV test looks for the virus that can cause the cell changes on the cervix. The cells collected during the Pap test can be  tested for HPV. The HPV test could be used to screen women aged 30 years and older, and should be used in women of any age who have unclear Pap test results. After the age of 30, women should have HPV testing at the same frequency as a Pap test.  Colorectal cancer can be detected and often prevented. Most routine colorectal cancer screening begins at the age of 50 years and continues through age 75 years. However, your health care provider may recommend screening at an earlier age if you have risk factors for colon cancer. On a yearly basis, your health care provider may provide home test kits to check for hidden blood in the stool. Use of a small camera at the end of a tube, to directly examine the colon (sigmoidoscopy or colonoscopy), can detect the earliest forms of colorectal cancer. Talk to your health care provider about this at age 50, when routine screening begins. Direct  exam of the colon should be repeated every 5-10 years through age 75 years, unless early forms of pre-cancerous polyps or small growths are found.  People who are at an increased risk for hepatitis B should be screened for this virus. You are considered at high risk for hepatitis B if:  You were born in a country where hepatitis B occurs often. Talk with your health care provider about which countries are considered high risk.  Your parents were born in a high-risk country and you have not received a shot to protect against hepatitis B (hepatitis B vaccine).  You have HIV or AIDS.  You use needles to inject street drugs.  You live with, or have sex with, someone who has hepatitis B.  You get hemodialysis treatment.  You take certain medicines for conditions like cancer, organ transplantation, and autoimmune conditions.  Hepatitis C blood testing is recommended for all people born from 1945 through 1965 and any individual with known risks for hepatitis C.  Practice safe sex. Use condoms and avoid high-risk sexual practices to reduce the spread of sexually transmitted infections (STIs). STIs include gonorrhea, chlamydia, syphilis, trichomonas, herpes, HPV, and human immunodeficiency virus (HIV). Herpes, HIV, and HPV are viral illnesses that have no cure. They can result in disability, cancer, and death.  You should be screened for sexually transmitted illnesses (STIs) including gonorrhea and chlamydia if:  You are sexually active and are younger than 24 years.  You are older than 24 years and your health care provider tells you that you are at risk for this type of infection.  Your sexual activity has changed since you were last screened and you are at an increased risk for chlamydia or gonorrhea. Ask your health care provider if you are at risk.  If you are at risk of being infected with HIV, it is recommended that you take a prescription medicine daily to prevent HIV infection. This is  called preexposure prophylaxis (PrEP). You are considered at risk if:  You are a heterosexual woman, are sexually active, and are at increased risk for HIV infection.  You take drugs by injection.  You are sexually active with a partner who has HIV.  Talk with your health care provider about whether you are at high risk of being infected with HIV. If you choose to begin PrEP, you should first be tested for HIV. You should then be tested every 3 months for as long as you are taking PrEP.  Osteoporosis is a disease in which the bones lose minerals and strength   with aging. This can result in serious bone fractures or breaks. The risk of osteoporosis can be identified using a bone density scan. Women ages 65 years and over and women at risk for fractures or osteoporosis should discuss screening with their health care providers. Ask your health care provider whether you should take a calcium supplement or vitamin D to reduce the rate of osteoporosis.  Menopause can be associated with physical symptoms and risks. Hormone replacement therapy is available to decrease symptoms and risks. You should talk to your health care provider about whether hormone replacement therapy is right for you.  Use sunscreen. Apply sunscreen liberally and repeatedly throughout the day. You should seek shade when your shadow is shorter than you. Protect yourself by wearing long sleeves, pants, a wide-brimmed hat, and sunglasses year round, whenever you are outdoors.  Once a month, do a whole body skin exam, using a mirror to look at the skin on your back. Tell your health care provider of new moles, moles that have irregular borders, moles that are larger than a pencil eraser, or moles that have changed in shape or color.  Stay current with required vaccines (immunizations).  Influenza vaccine. All adults should be immunized every year.  Tetanus, diphtheria, and acellular pertussis (Td, Tdap) vaccine. Pregnant women should  receive 1 dose of Tdap vaccine during each pregnancy. The dose should be obtained regardless of the length of time since the last dose. Immunization is preferred during the 27th-36th week of gestation. An adult who has not previously received Tdap or who does not know her vaccine status should receive 1 dose of Tdap. This initial dose should be followed by tetanus and diphtheria toxoids (Td) booster doses every 10 years. Adults with an unknown or incomplete history of completing a 3-dose immunization series with Td-containing vaccines should begin or complete a primary immunization series including a Tdap dose. Adults should receive a Td booster every 10 years.  Varicella vaccine. An adult without evidence of immunity to varicella should receive 2 doses or a second dose if she has previously received 1 dose. Pregnant females who do not have evidence of immunity should receive the first dose after pregnancy. This first dose should be obtained before leaving the health care facility. The second dose should be obtained 4-8 weeks after the first dose.  Human papillomavirus (HPV) vaccine. Females aged 13-26 years who have not received the vaccine previously should obtain the 3-dose series. The vaccine is not recommended for use in pregnant females. However, pregnancy testing is not needed before receiving a dose. If a female is found to be pregnant after receiving a dose, no treatment is needed. In that case, the remaining doses should be delayed until after the pregnancy. Immunization is recommended for any person with an immunocompromised condition through the age of 26 years if she did not get any or all doses earlier. During the 3-dose series, the second dose should be obtained 4-8 weeks after the first dose. The third dose should be obtained 24 weeks after the first dose and 16 weeks after the second dose.  Zoster vaccine. One dose is recommended for adults aged 60 years or older unless certain conditions are  present.  Measles, mumps, and rubella (MMR) vaccine. Adults born before 1957 generally are considered immune to measles and mumps. Adults born in 1957 or later should have 1 or more doses of MMR vaccine unless there is a contraindication to the vaccine or there is laboratory evidence of immunity to   each of the three diseases. A routine second dose of MMR vaccine should be obtained at least 28 days after the first dose for students attending postsecondary schools, health care workers, or international travelers. People who received inactivated measles vaccine or an unknown type of measles vaccine during 1963-1967 should receive 2 doses of MMR vaccine. People who received inactivated mumps vaccine or an unknown type of mumps vaccine before 1979 and are at high risk for mumps infection should consider immunization with 2 doses of MMR vaccine. For females of childbearing age, rubella immunity should be determined. If there is no evidence of immunity, females who are not pregnant should be vaccinated. If there is no evidence of immunity, females who are pregnant should delay immunization until after pregnancy. Unvaccinated health care workers born before 1957 who lack laboratory evidence of measles, mumps, or rubella immunity or laboratory confirmation of disease should consider measles and mumps immunization with 2 doses of MMR vaccine or rubella immunization with 1 dose of MMR vaccine.  Pneumococcal 13-valent conjugate (PCV13) vaccine. When indicated, a person who is uncertain of her immunization history and has no record of immunization should receive the PCV13 vaccine. An adult aged 19 years or older who has certain medical conditions and has not been previously immunized should receive 1 dose of PCV13 vaccine. This PCV13 should be followed with a dose of pneumococcal polysaccharide (PPSV23) vaccine. The PPSV23 vaccine dose should be obtained at least 8 weeks after the dose of PCV13 vaccine. An adult aged 19  years or older who has certain medical conditions and previously received 1 or more doses of PPSV23 vaccine should receive 1 dose of PCV13. The PCV13 vaccine dose should be obtained 1 or more years after the last PPSV23 vaccine dose.  Pneumococcal polysaccharide (PPSV23) vaccine. When PCV13 is also indicated, PCV13 should be obtained first. All adults aged 65 years and older should be immunized. An adult younger than age 65 years who has certain medical conditions should be immunized. Any person who resides in a nursing home or long-term care facility should be immunized. An adult smoker should be immunized. People with an immunocompromised condition and certain other conditions should receive both PCV13 and PPSV23 vaccines. People with human immunodeficiency virus (HIV) infection should be immunized as soon as possible after diagnosis. Immunization during chemotherapy or radiation therapy should be avoided. Routine use of PPSV23 vaccine is not recommended for American Indians, [place] Natives, or people younger than 65 years unless there are medical conditions that require PPSV23 vaccine. When indicated, people who have unknown immunization and have no record of immunization should receive PPSV23 vaccine. One-time revaccination 5 years after the first dose of PPSV23 is recommended for people aged 19-64 years who have chronic kidney failure, nephrotic syndrome, asplenia, or immunocompromised conditions. People who received 1-2 doses of PPSV23 before age 65 years should receive another dose of PPSV23 vaccine at age 65 years or later if at least 5 years have passed since the previous dose. Doses of PPSV23 are not needed for people immunized with PPSV23 at or after age 65 years.  Meningococcal vaccine. Adults with asplenia or persistent complement component deficiencies should receive 2 doses of quadrivalent meningococcal conjugate (MenACWY-D) vaccine. The doses should be obtained at least 2 months apart.  Microbiologists working with certain meningococcal bacteria, military recruits, people at risk during an outbreak, and people who travel to or live in countries with a high rate of meningitis should be immunized. A first-year college student up through age   21 years who is living in a residence hall should receive a dose if she did not receive a dose on or after her 16th birthday. Adults who have certain high-risk conditions should receive one or more doses of vaccine.  Hepatitis A vaccine. Adults who wish to be protected from this disease, have certain high-risk conditions, work with hepatitis A-infected animals, work in hepatitis A research labs, or travel to or work in countries with a high rate of hepatitis A should be immunized. Adults who were previously unvaccinated and who anticipate close contact with an international adoptee during the first 60 days after arrival in the [place] from a country with a high rate of hepatitis A should be immunized.  Hepatitis B vaccine. Adults who wish to be protected from this disease, have certain high-risk conditions, may be exposed to blood or other infectious body fluids, are household contacts or sex partners of hepatitis B positive people, are clients or workers in certain care facilities, or travel to or work in countries with a high rate of hepatitis B should be immunized.  Haemophilus influenzae type b (Hib) vaccine. A previously unvaccinated person with asplenia or sickle cell disease or having a scheduled splenectomy should receive 1 dose of Hib vaccine. Regardless of previous immunization, a recipient of a hematopoietic stem cell transplant should receive a 3-dose series 6-12 months after her successful transplant. Hib vaccine is not recommended for adults with HIV infection. Preventive Services / Frequency Ages 64 to 68 years  Blood pressure check.** / Every 1 to 2 years.  Lipid and cholesterol check.** / Every 5 years beginning at age  22.  Clinical breast exam.** / Every 3 years for women in their 88s and 53s.  BRCA-related cancer risk assessment.** / For women who have family members with a BRCA-related cancer (breast, ovarian, tubal, or peritoneal cancers).  Pap test.** / Every 2 years from ages 90 through 51. Every 3 years starting at age 21 through age 56 or 3 with a history of 3 consecutive normal Pap tests.  HPV screening.** / Every 3 years from ages 24 through ages 1 to 46 with a history of 3 consecutive normal Pap tests.  Hepatitis C blood test.** / For any individual with known risks for hepatitis C.  Skin self-exam. / Monthly.  Influenza vaccine. / Every year.  Tetanus, diphtheria, and acellular pertussis (Tdap, Td) vaccine.** / Consult your health care provider. Pregnant women should receive 1 dose of Tdap vaccine during each pregnancy. 1 dose of Td every 10 years.  Varicella vaccine.** / Consult your health care provider. Pregnant females who do not have evidence of immunity should receive the first dose after pregnancy.  HPV vaccine. / 3 doses over 6 months, if 72 and younger. The vaccine is not recommended for use in pregnant females. However, pregnancy testing is not needed before receiving a dose.  Measles, mumps, rubella (MMR) vaccine.** / You need at least 1 dose of MMR if you were born in 1957 or later. You may also need a 2nd dose. For females of childbearing age, rubella immunity should be determined. If there is no evidence of immunity, females who are not pregnant should be vaccinated. If there is no evidence of immunity, females who are pregnant should delay immunization until after pregnancy.  Pneumococcal 13-valent conjugate (PCV13) vaccine.** / Consult your health care provider.  Pneumococcal polysaccharide (PPSV23) vaccine.** / 1 to 2 doses if you smoke cigarettes or if you have certain conditions.  Meningococcal vaccine.** /  1 dose if you are age 19 to 21 years and a first-year college  student living in a residence hall, or have one of several medical conditions, you need to get vaccinated against meningococcal disease. You may also need additional booster doses.  Hepatitis A vaccine.** / Consult your health care provider.  Hepatitis B vaccine.** / Consult your health care provider.  Haemophilus influenzae type b (Hib) vaccine.** / Consult your health care provider. Ages 40 to 64 years  Blood pressure check.** / Every 1 to 2 years.  Lipid and cholesterol check.** / Every 5 years beginning at age 20 years.  Lung cancer screening. / Every year if you are aged 55-80 years and have a 30-pack-year history of smoking and currently smoke or have quit within the past 15 years. Yearly screening is stopped once you have quit smoking for at least 15 years or develop a health problem that would prevent you from having lung cancer treatment.  Clinical breast exam.** / Every year after age 40 years.  BRCA-related cancer risk assessment.** / For women who have family members with a BRCA-related cancer (breast, ovarian, tubal, or peritoneal cancers).  Mammogram.** / Every year beginning at age 40 years and continuing for as long as you are in good health. Consult with your health care provider.  Pap test.** / Every 3 years starting at age 30 years through age 65 or 70 years with a history of 3 consecutive normal Pap tests.  HPV screening.** / Every 3 years from ages 30 years through ages 65 to 70 years with a history of 3 consecutive normal Pap tests.  Fecal occult blood test (FOBT) of stool. / Every year beginning at age 50 years and continuing until age 75 years. You may not need to do this test if you get a colonoscopy every 10 years.  Flexible sigmoidoscopy or colonoscopy.** / Every 5 years for a flexible sigmoidoscopy or every 10 years for a colonoscopy beginning at age 50 years and continuing until age 75 years.  Hepatitis C blood test.** / For all people born from 1945 through  1965 and any individual with known risks for hepatitis C.  Skin self-exam. / Monthly.  Influenza vaccine. / Every year.  Tetanus, diphtheria, and acellular pertussis (Tdap/Td) vaccine.** / Consult your health care provider. Pregnant women should receive 1 dose of Tdap vaccine during each pregnancy. 1 dose of Td every 10 years.  Varicella vaccine.** / Consult your health care provider. Pregnant females who do not have evidence of immunity should receive the first dose after pregnancy.  Zoster vaccine.** / 1 dose for adults aged 60 years or older.  Measles, mumps, rubella (MMR) vaccine.** / You need at least 1 dose of MMR if you were born in 1957 or later. You may also need a 2nd dose. For females of childbearing age, rubella immunity should be determined. If there is no evidence of immunity, females who are not pregnant should be vaccinated. If there is no evidence of immunity, females who are pregnant should delay immunization until after pregnancy.  Pneumococcal 13-valent conjugate (PCV13) vaccine.** / Consult your health care provider.  Pneumococcal polysaccharide (PPSV23) vaccine.** / 1 to 2 doses if you smoke cigarettes or if you have certain conditions.  Meningococcal vaccine.** / Consult your health care provider.  Hepatitis A vaccine.** / Consult your health care provider.  Hepatitis B vaccine.** / Consult your health care provider.  Haemophilus influenzae type b (Hib) vaccine.** / Consult your health care provider. Ages 65   years and over  Blood pressure check.** / Every 1 to 2 years.  Lipid and cholesterol check.** / Every 5 years beginning at age 22 years.  Lung cancer screening. / Every year if you are aged 73-80 years and have a 30-pack-year history of smoking and currently smoke or have quit within the past 15 years. Yearly screening is stopped once you have quit smoking for at least 15 years or develop a health problem that would prevent you from having lung cancer  treatment.  Clinical breast exam.** / Every year after age 4 years.  BRCA-related cancer risk assessment.** / For women who have family members with a BRCA-related cancer (breast, ovarian, tubal, or peritoneal cancers).  Mammogram.** / Every year beginning at age 40 years and continuing for as long as you are in good health. Consult with your health care provider.  Pap test.** / Every 3 years starting at age 9 years through age 34 or 91 years with 3 consecutive normal Pap tests. Testing can be stopped between 65 and 70 years with 3 consecutive normal Pap tests and no abnormal Pap or HPV tests in the past 10 years.  HPV screening.** / Every 3 years from ages 57 years through ages 64 or 45 years with a history of 3 consecutive normal Pap tests. Testing can be stopped between 65 and 70 years with 3 consecutive normal Pap tests and no abnormal Pap or HPV tests in the past 10 years.  Fecal occult blood test (FOBT) of stool. / Every year beginning at age 15 years and continuing until age 17 years. You may not need to do this test if you get a colonoscopy every 10 years.  Flexible sigmoidoscopy or colonoscopy.** / Every 5 years for a flexible sigmoidoscopy or every 10 years for a colonoscopy beginning at age 86 years and continuing until age 71 years.  Hepatitis C blood test.** / For all people born from [birth year] through 1964 and any individual with known risks for hepatitis C.  Osteoporosis screening.** / A one-time screening for women ages 83 years and over and women at risk for fractures or osteoporosis.  Skin self-exam. / Monthly.  Influenza vaccine. / Every year.  Tetanus, diphtheria, and acellular pertussis (Tdap/Td) vaccine.** / 1 dose of Td every 10 years.  Varicella vaccine.** / Consult your health care provider.  Zoster vaccine.** / 1 dose for adults aged 61 years or older.  Pneumococcal 13-valent conjugate (PCV13) vaccine.** / Consult your health care provider.  Pneumococcal  polysaccharide (PPSV23) vaccine.** / 1 dose for all adults aged 28 years and older.  Meningococcal vaccine.** / Consult your health care provider.  Hepatitis A vaccine.** / Consult your health care provider.  Hepatitis B vaccine.** / Consult your health care provider.  Haemophilus influenzae type b (Hib) vaccine.** / Consult your health care provider. ** Family history and personal history of risk and conditions may change your health care provider's recommendations. Document Released: 11/11/2001 Document Revised: 01/30/2014 Document Reviewed: 02/10/2011 [facility] Patient Information 2015 [place], [place]. This information is not intended to replace advice given to you by your health care provider. Make sure you discuss any questions you have with your health care provider.

## 2015-06-12 LAB — CBC
HCT: 39.8 % (ref 36.0–46.0)
Hemoglobin: 13.3 g/dL (ref 12.0–15.0)
MCHC: 33.4 g/dL (ref 30.0–36.0)
MCV: 95.6 fl (ref 78.0–100.0)
Platelets: 208 10*3/uL (ref 150.0–400.0)
RBC: 4.16 Mil/uL (ref 3.87–5.11)
RDW: 13.8 % (ref 11.5–15.5)
WBC: 6.2 10*3/uL (ref 4.0–10.5)

## 2015-06-12 LAB — COMPREHENSIVE METABOLIC PANEL
ALBUMIN: 4.1 g/dL (ref 3.5–5.2)
ALK PHOS: 81 U/L (ref 39–117)
ALT: 29 U/L (ref 0–35)
AST: 23 U/L (ref 0–37)
BUN: 13 mg/dL (ref 6–23)
CO2: 28 mEq/L (ref 19–32)
Calcium: 10.8 mg/dL — ABNORMAL HIGH (ref 8.4–10.5)
Chloride: 102 mEq/L (ref 96–112)
Creatinine, Ser: 0.55 mg/dL (ref 0.40–1.20)
GFR: 122.59 mL/min (ref >60.00)
GLUCOSE: 106 mg/dL — AB (ref 70–99)
Potassium: 3.9 mEq/L (ref 3.5–5.1)
SODIUM: 140 meq/L (ref 135–145)
TOTAL PROTEIN: 7 g/dL (ref 6.0–8.3)
Total Bilirubin: 0.5 mg/dL (ref 0.2–1.2)

## 2015-06-12 LAB — VITAMIN D 25 HYDROXY (VIT D DEFICIENCY, FRACTURES): VITD: 25.62 ng/mL — AB (ref 30.00–100.00)

## 2015-06-12 LAB — HEMOGLOBIN A1C: Hgb A1c MFr Bld: 5.4 % (ref 4.6–6.5)

## 2015-06-12 LAB — SEDIMENTATION RATE: SED RATE: 16 mm/h (ref 0–22)

## 2015-06-12 LAB — LIPID PANEL
CHOL/HDL RATIO: 3
CHOLESTEROL: 226 mg/dL — AB (ref 0–200)
HDL: 73.8 mg/dL (ref >39.00)
NonHDL: 151.74
Triglycerides: 257 mg/dL — ABNORMAL HIGH (ref 0.0–149.0)
VLDL: 51.4 mg/dL — ABNORMAL HIGH (ref 0.0–40.0)

## 2015-06-12 LAB — LYME AB/WESTERN BLOT REFLEX: B burgdorferi Ab IgG+IgM: 0.38 {ISR}

## 2015-06-12 LAB — ANA: Anti Nuclear Antibody(ANA): NEGATIVE

## 2015-06-12 LAB — LDL CHOLESTEROL, DIRECT: Direct LDL: 131 mg/dL

## 2015-06-12 LAB — URIC ACID: URIC ACID, SERUM: 8.1 mg/dL — AB (ref 2.4–7.0)

## 2015-06-12 LAB — TSH: TSH: 1.92 u[IU]/mL (ref 0.35–4.50)

## 2015-06-12 LAB — PTH, INTACT AND CALCIUM
Calcium: 10.2 mg/dL (ref 8.4–10.5)
PTH: 72 pg/mL — ABNORMAL HIGH (ref 14–64)

## 2015-06-13 ENCOUNTER — Telehealth: Payer: Self-pay

## 2015-06-13 LAB — ROCKY MTN SPOTTED FVR ABS PNL(IGG+IGM)
RMSF IgG: 0.1 IV
RMSF IgM: 0.62 IV

## 2015-06-13 NOTE — Telephone Encounter (Signed)
-----   Message from Mosie Lukes, MD sent at 06/12/2015 10:16 PM EDT ----- Notify vitamin d low. pth and calcium is high, would treat vit d for 12 weeks then recheck pth, vit d and cmp to assess response. Start Vitamin D 50000 IU caps 1 cap po q week x 12 weeks disp #4 with 4 rf also start Vitamin D 2000 IU caps daily as well. Also cholesterol is notably hi still would ask her to consider starting statin, Atorvastatin 10 mg tabs 1 tab po daily disp #30 with 3 rf, check lipid also in 3 months

## 2015-06-13 NOTE — Telephone Encounter (Signed)
Pt would like to know if she should still go to to Rheumatology and possible Endocrinology for PTH and Calcium. Please advise.

## 2015-06-13 NOTE — Telephone Encounter (Signed)
Should definitely see Endocrinology. Rheumatology is up to her

## 2015-06-14 NOTE — Telephone Encounter (Signed)
Pt notified states she would like to try to find her own provider at the moment but will let us know if she needs a referral

## 2015-06-21 ENCOUNTER — Other Ambulatory Visit: Payer: Self-pay | Admitting: Family Medicine

## 2015-06-21 ENCOUNTER — Telehealth: Payer: Self-pay | Admitting: Family Medicine

## 2015-06-21 DIAGNOSIS — M549 Dorsalgia, unspecified: Secondary | ICD-10-CM

## 2015-06-21 NOTE — Telephone Encounter (Signed)
Pt states she discussed neuro/pain mgmt referral with Dr. Charlett Blake. She is not happy with current provider. She would like to see Dr. Park Breed, [facility], Fax # [phone], appt cannot be scheduled until she has a referral.  Pt said she was told to take Vit D but no RX was called in. [facility] [address]

## 2015-06-21 NOTE — Telephone Encounter (Signed)
Referral placed her vitamin d is not super low but can send in vitamin D 50000 IU caps 1 cap po q week x 12 weeks and recheck in 3 months, disp #4 with 3 rf

## 2015-06-22 MED ORDER — VITAMIN D (ERGOCALCIFEROL) 1.25 MG (50000 UNIT) PO CAPS: 50000.0000 [IU] | ORAL_CAPSULE | ORAL | Status: DC

## 2015-06-22 NOTE — Telephone Encounter (Signed)
[facility] [address]  Patient notified.  Patient states she has marked her calendar to call and schedule lab visit, would not like to schedule at this time.

## 2015-06-24 ENCOUNTER — Encounter: Payer: Self-pay | Admitting: Family Medicine

## 2015-06-24 DIAGNOSIS — Z0000 Encounter for general adult medical examination without abnormal findings: Secondary | ICD-10-CM | POA: Insufficient documentation

## 2015-06-24 DIAGNOSIS — M25561 Pain in right knee: Secondary | ICD-10-CM | POA: Insufficient documentation

## 2015-06-24 DIAGNOSIS — E559 Vitamin D deficiency, unspecified: Secondary | ICD-10-CM

## 2015-06-24 HISTORY — DX: Vitamin D deficiency, unspecified: E55.9

## 2015-06-24 HISTORY — DX: Encounter for general adult medical examination without abnormal findings: Z00.00

## 2015-06-24 HISTORY — DX: Pain in right knee: M25.561

## 2015-06-24 NOTE — Assessment & Plan Note (Signed)
hgba1c acceptable, minimize simple carbs. Increase exercise as tolerated.  

## 2015-06-24 NOTE — Progress Notes (Signed)
[name] [name] [id number] [birth year] 06/24/2015      Progress Note New Patient  Subjective  Chief Complaint  Chief Complaint  Patient presents with  . Medicare Wellness    needs Mmg  . Knee Pain    right knee, no swelling, no redness, intermittent x3-4 months, worse in morning, concerned about Rheumatoid arthritis     HPI  Patient is a 54 year old female in today for routine medical care. She is in today for annual exam and evaluation of numerous concerns. Her right knee is worsening. Is been no trauma or severe swelling or heat. The pain is persistent. No previous injury. Otherwise feels well. No polyuria or polydipsia. Back pain is persistent but stable. Anhedonia at times but no severe depression or suicidal ideation. Has been trying to increase activity and minimize carbohydrate intake. Denies CP/palp/SOB/HA/congestion/fevers/GI or GU c/o. Taking meds as prescribed  Past Medical History  Diagnosis Date  . Ovarian cyst, bilateral   . Chronic back pain   . Morbid obesity   . Hypertension   . Anxiety and depression     chronic  . History of fibromyalgia     suspected   . Insomnia   . Depression with anxiety 08/23/2008    Qualifier: Diagnosis of  By: Niel Hummer MD, [place] PTSD (post-traumatic stress disorder)   . Acute upper respiratory infections of unspecified site 06/12/2014    Past Surgical History  Procedure Laterality Date  . Ovarian cyst removal      Family History  Problem Relation Age of Onset  . ADD / ADHD Neg Hx   . Depression Neg Hx   . Alcohol abuse Neg Hx   . Drug abuse Neg Hx   . Rheum arthritis Father   . Diabetes Father   . Congestive Heart Failure Father   . Hypertension Father   . Cancer Maternal Uncle 64    colon  . Hip fracture Maternal Grandmother   . COPD Maternal Grandfather 87    colon  . Lymphoma Paternal Grandmother     Social History   Social History  . Marital Status: Divorced    Spouse Name: N/A  . Number of Children:  N/A  . Years of Education: N/A   Occupational History  . Not on file.   Social History Main Topics  . Smoking status: Former Smoker    Quit date: 04/04/1989  . Smokeless tobacco: Never Used  . Alcohol Use: Yes     Comment: occassionally- 2 glassses of wine 2x/month  . Drug Use: No     Comment: take meds as prescribed  . Sexual Activity: Not on file   Other Topics Concern  . Not on file   Social History Narrative    Current Outpatient Prescriptions on File Prior to Visit  Medication Sig Dispense Refill  . ALPRAZolam (XANAX) 1 MG tablet TAKE 1 TABLET BY MOUTH 3 TIMES DAILY AS NEEDED 90 tablet 2  . beclomethasone (BECONASE-AQ) 42 MCG/SPRAY nasal spray Place 1 spray into both nostrils 2 (two) times daily. Dose is for each nostril. 25 g 1  . lisinopril (PRINIVIL,ZESTRIL) 10 MG tablet TAKE 1 TABLET (10 MG TOTAL) BY MOUTH DAILY. 90 tablet 1  . metoprolol succinate (TOPROL-XL) 25 MG 24 hr tablet TAKE 1 TABLET BY MOUTH EVERY DAY 30 tablet 6  . sertraline (ZOLOFT) 100 MG tablet Take 1 tablet (100 mg total) by mouth daily. 30 tablet 5  . triamterene-hydrochlorothiazide (MAXZIDE-25) 37.5-25 MG per  tablet TAKE 1 TABLET BY MOUTH DAILY. 30 tablet 4  . zolpidem (AMBIEN) 10 MG tablet Take 1 tablet (10 mg total) by mouth at bedtime as needed for sleep. 30 tablet 2  . amphetamine-dextroamphetamine (ADDERALL) 20 MG tablet Take 1 tablet (20 mg total) by mouth 2 (two) times daily. (Patient not taking: Reported on 06/11/2015) 60 tablet 0   No current facility-administered medications on file prior to visit.    Allergies  Allergen Reactions  . Cymbalta [Duloxetine Hcl] Other (See Comments)    Headaches  . Fiorinal [Butalbital-Aspirin-Caffeine] Other (See Comments)    Mental Clarity.    Review of Systems  Review of Systems  Constitutional: Positive for malaise/fatigue. Negative for fever and chills.  HENT: Negative for congestion and hearing loss.   Eyes: Negative for discharge.  Respiratory:  Negative for cough, sputum production and shortness of breath.   Cardiovascular: Negative for chest pain, palpitations and leg swelling.  Gastrointestinal: Negative for heartburn, nausea, vomiting, abdominal pain, diarrhea, constipation and blood in stool.  Genitourinary: Negative for dysuria, urgency, frequency and hematuria.  Musculoskeletal: Positive for joint pain. Negative for myalgias, back pain and falls.  Skin: Negative for rash.  Neurological: Negative for dizziness, sensory change, loss of consciousness, weakness and headaches.  Endo/Heme/Allergies: Negative for environmental allergies. Does not bruise/bleed easily.  Psychiatric/Behavioral: Negative for depression and suicidal ideas. The patient is nervous/anxious. The patient does not have insomnia.     Objective  BP 104/62 mmHg  Pulse 74  Temp(Src) 97.9 F (36.6 C) (Oral)  Resp 16  Ht 5\' 5"  (1.651 m)  Wt 241 lb 3.2 oz (109.408 kg)  BMI 40.14 kg/m2  SpO2 94%  Physical Exam  Physical Exam  Constitutional: She is oriented to person, place, and time and well-developed, well-nourished, and in no distress. No distress.  HENT:  Head: Normocephalic and atraumatic.  Right Ear: External ear normal.  Left Ear: External ear normal.  Nose: Nose normal.  Mouth/Throat: Oropharynx is clear and moist. No oropharyngeal exudate.  Eyes: Conjunctivae are normal. Pupils are equal, round, and reactive to light. Right eye exhibits no discharge. Left eye exhibits no discharge. No scleral icterus.  Neck: Normal range of motion. Neck supple. No thyromegaly present.  Cardiovascular: Normal rate, regular rhythm, normal heart sounds and intact distal pulses.   No murmur heard. Pulmonary/Chest: Effort normal and breath sounds normal. No respiratory distress. She has no wheezes. She has no rales.  Abdominal: Soft. Bowel sounds are normal. She exhibits no distension and no mass. There is no tenderness.  Musculoskeletal: Normal range of motion. She  exhibits no edema or tenderness.  Lymphadenopathy:    She has no cervical adenopathy.  Neurological: She is alert and oriented to person, place, and time. She has normal reflexes. No cranial nerve deficit. Coordination normal.  Skin: Skin is warm and dry. No rash noted. She is not diaphoretic.  Psychiatric: Mood, memory and affect normal.       Assessment & Plan  Vitamin D deficiency Started on Vit D 50000 IU weekly and 2000 IU daily  HYPERGLYCEMIA hgba1c acceptable, minimize simple carbs. Increase exercise as tolerated.  Hypercalcemia With elevated PTH, is following with endocrinology, calcium improved on recheck  MORBID OBESITY Encouraged DASH diet, decrease po intake and increase exercise as tolerated. Needs 7-8 hours of sleep nightly. Avoid trans fats, eat small, frequent meals every 4-5 hours with lean proteins, complex carbs and healthy fats. Minimize simple carbs, GMO foods.  Essential hypertension Well controlled, no changes  to meds. Encouraged heart healthy diet such as the DASH diet and exercise as tolerated.   Hyperlipidemia Encouraged heart healthy diet, increase exercise, avoid trans fats, consider a krill oil cap daily  Hypothyroidism On Levothyroxine, continue to monitor  Knee pain, right Labs for rheumatologic concerns are negative. Encouraged topical treatments and consider referral if symptoms worsen  Preventative health care Patient encouraged to maintain heart healthy diet, regular exercise, adequate sleep. Consider daily probiotics. Take medications as prescribed. Given and reviewed copy of ACP documents from [employer] and encouraged to complete and return. Labs reviewed.

## 2015-06-24 NOTE — Assessment & Plan Note (Signed)
Started on Vit D 50000 IU weekly and 2000 IU daily

## 2015-06-24 NOTE — Assessment & Plan Note (Signed)
Well controlled, no changes to meds. Encouraged heart healthy diet such as the DASH diet and exercise as tolerated.  °

## 2015-06-24 NOTE — Assessment & Plan Note (Signed)
On Levothyroxine, continue to monitor 

## 2015-06-24 NOTE — Assessment & Plan Note (Signed)
Patient encouraged to maintain heart healthy diet, regular exercise, adequate sleep. Consider daily probiotics. Take medications as prescribed. Given and reviewed copy of ACP documents from [place] Secretary of State and encouraged to complete and return. Labs reviewed.  

## 2015-06-24 NOTE — Assessment & Plan Note (Signed)
Labs for rheumatologic concerns are negative. Encouraged topical treatments and consider referral if symptoms worsen

## 2015-06-24 NOTE — Assessment & Plan Note (Signed)
With elevated PTH, is following with endocrinology, calcium improved on recheck

## 2015-06-24 NOTE — Assessment & Plan Note (Signed)
Encouraged heart healthy diet, increase exercise, avoid trans fats, consider a krill oil cap daily 

## 2015-06-24 NOTE — Assessment & Plan Note (Signed)
Encouraged DASH diet, decrease po intake and increase exercise as tolerated. Needs 7-8 hours of sleep nightly. Avoid trans fats, eat small, frequent meals every 4-5 hours with lean proteins, complex carbs and healthy fats. Minimize simple carbs, GMO foods. 

## 2015-08-08 ENCOUNTER — Telehealth: Payer: Self-pay | Admitting: Family Medicine

## 2015-08-08 NOTE — Telephone Encounter (Signed)
Relation to [id number] Call back number: [phone]  :  Reason for call:  Patient would like PCP to take over  pain medication, patient is no longer going to pain management and would like PCP to eventually refer her out but for now would like PCP to take over medication, please advise

## 2015-08-09 ENCOUNTER — Encounter: Payer: Self-pay | Admitting: Family Medicine

## 2015-08-09 NOTE — Telephone Encounter (Signed)
Called the patient and she did agree to sign contract and UDS. The patches are not due for refill until another week or so.  ALSO, she is taking Oxycodone 15 mg to take tid prn for breakthrough pain.  Receives #90 a month, not due until 08/24/2015 for refill. Would you be able/willing to take that over as well? The patient also stated she is going to try and see a psy. To take over her anxiety medication. She is coming by today to do the contract/UDS.  Put at the front desk.

## 2015-08-09 NOTE — Telephone Encounter (Signed)
Patient notified PCP agreed to take over refills on oxycodone.  Updated medication list to add oxycodone to list.  Patient will call in a couple weeks when medications are due.

## 2015-08-09 NOTE — Telephone Encounter (Signed)
OK to fill her Oxycodone as well

## 2015-08-09 NOTE — Telephone Encounter (Signed)
I am willing to take over if her doses stay stable. She will need a substance use contract and a UDS Then am willing to write her Fentanyl

## 2015-08-17 ENCOUNTER — Telehealth: Payer: Self-pay | Admitting: Family Medicine

## 2015-08-17 MED ORDER — FENTANYL 25 MCG/HR TD PT72: 25.0000 ug | MEDICATED_PATCH | TRANSDERMAL | Status: DC

## 2015-08-17 NOTE — Telephone Encounter (Signed)
°  Relation to [id number] Call back [place]:  Reason for call: pt is needing rx oxyCODONE (ROXICODONE) 15 MG immediate release tablet, please call when available for pick up

## 2015-08-17 NOTE — Telephone Encounter (Signed)
OK to change to the 37.5 patch with same sig, disp #10

## 2015-08-17 NOTE — Telephone Encounter (Signed)
Patient called back to inform her fentanyl patch was written incorrectly. She has been on the fentanyl 37.5 and wanted to know previous to that.   Number to call back [phone]

## 2015-08-17 NOTE — Telephone Encounter (Signed)
Patient came to the office to request pain patch refills PCP agreed to take over refills.   Patient completed pain contract and UDS. See telephone note dated 08/08/2015 in regards to this refill.  Printed patches and PCP signed.

## 2015-08-20 MED ORDER — FENTANYL 37.5 MCG/HR TD PT72: 1.0000 | MEDICATED_PATCH | TRANSDERMAL | Status: DC

## 2015-08-20 MED ORDER — OXYCODONE HCL 15 MG PO TABS: 15.0000 mg | ORAL_TABLET | Freq: Three times a day (TID) | ORAL | Status: DC | PRN

## 2015-08-20 NOTE — Telephone Encounter (Signed)
Previous bottle of medication is on PCP's desk

## 2015-08-20 NOTE — Telephone Encounter (Signed)
Changed prescription, printed new one and on counter for signature by PCP.

## 2015-08-20 NOTE — Telephone Encounter (Signed)
Printed and on counter for signature. 

## 2015-08-20 NOTE — Addendum Note (Signed)
Addended by: Sharon Seller B on: 08/20/2015 07:40 AM   Modules accepted: Orders, Medications

## 2015-08-20 NOTE — Telephone Encounter (Signed)
Please print Oxycodone 15 mg tabs 1 tab po tid prn pain disp  #90

## 2015-08-20 NOTE — Telephone Encounter (Signed)
Called the patient informed to pickup hardcopy at the front desk. Her friend Cheron Schaumann (appt at 2 with PCP) will pickup at the front desk. Patient informed as well and friend will take home to the patient.

## 2015-08-20 NOTE — Telephone Encounter (Signed)
Bringing old rx bottle to show what she was taking prior meds

## 2015-08-20 NOTE — Telephone Encounter (Signed)
Patient informed to pickup hardcopy of prescription at the front desk.

## 2015-08-20 NOTE — Telephone Encounter (Signed)
Pt states that original bottle was dispensed on 07/20/15 and she is really wanting to get filled. Her roommate has an appt today at 2:00pm with Dr. Charlett Blake. She is wanting Debbie to pick up RX for her if we can write it for then.

## 2015-08-31 ENCOUNTER — Other Ambulatory Visit: Payer: Self-pay | Admitting: Family Medicine

## 2015-09-03 ENCOUNTER — Telehealth: Payer: Self-pay | Admitting: Family Medicine

## 2015-09-03 MED ORDER — ALPRAZOLAM 1 MG PO TABS: 1.0000 mg | ORAL_TABLET | Freq: Three times a day (TID) | ORAL | Status: DC | PRN

## 2015-09-03 NOTE — Telephone Encounter (Signed)
OK to refill Alprazolam with same strength, same sig, same number 2 rf

## 2015-09-03 NOTE — Telephone Encounter (Signed)
Water quality scientist and on counter for signature. Faxed hard[facility] [place]

## 2015-09-03 NOTE — Telephone Encounter (Signed)
Relation to [id number] Call back number: [phone]  Pharmacy:  Reason for call:  Patient requesting a refill ALPRAZolam (XANAX) 1 MG tablet

## 2015-09-03 NOTE — Telephone Encounter (Signed)
Requesting: Alprazolam Contract  Signed on 08/16/15 UDS  Moderate Last OV   06/11/15 Last Refill   #90 with 2 refills on 05/27/2015  Please Advise

## 2015-09-10 ENCOUNTER — Ambulatory Visit: Payer: Managed Care, Other (non HMO) | Admitting: Family Medicine

## 2015-09-12 ENCOUNTER — Telehealth: Payer: Self-pay | Admitting: Family Medicine

## 2015-09-12 DIAGNOSIS — Z1159 Encounter for screening for other viral diseases: Secondary | ICD-10-CM

## 2015-09-12 NOTE — Telephone Encounter (Signed)
Relation to [id number] Call back number:[phone]   Reason for call:   Patient would like shingles and hep c injection before the new year and would like orders. Please advise   Patient requesting a refill oxyCODONE (ROXICODONE) 15 MG immediate and FentaNYL 37.5 MCG/HR PT72 patient states she is aware she is not due but would like to pick up due to the holidays approaching and she does not want a delay.

## 2015-09-13 MED ORDER — FENTANYL 37.5 MCG/HR TD PT72: 1.0000 | MEDICATED_PATCH | TRANSDERMAL | Status: DC

## 2015-09-13 MED ORDER — OXYCODONE HCL 15 MG PO TABS: 15.0000 mg | ORAL_TABLET | Freq: Three times a day (TID) | ORAL | Status: DC | PRN

## 2015-09-13 NOTE — Telephone Encounter (Signed)
Requesting:  Oxycodone and Fentanyl Patch Contract signed on 08/16/15 UDS  Moderate Last OV   06/11/15 Last Refill   Oxy--#90 with 0 refills on 08/20/2015                     Fentanyl---#10 with 0 refills on 08/20/2015 Please Advise

## 2015-09-13 NOTE — Telephone Encounter (Signed)
OK to refill both meds to be filled on 12/21. Hepatitis C is not an injection it is a blood test to screen for hep C. Please order the Hep C antibody test for need for viral screen. The zostavax may not be covered by her insurance before she turns 66. If she has checked with them or is willing to take th risk she will get a bill we can proceed. She will just have to sign that she knows

## 2015-09-13 NOTE — Telephone Encounter (Signed)
Printed both prescriptions and on counter for signature. Called the patient informed of hardcopy to pickup at the front desk. Also put order in for Hep C lab test and the patient will schedule nurse visit for Shingles shot as she already has discussed with current insurance shingles is covered 100%.

## 2015-09-14 ENCOUNTER — Other Ambulatory Visit: Payer: Managed Care, Other (non HMO)

## 2015-09-18 ENCOUNTER — Ambulatory Visit: Payer: Managed Care, Other (non HMO)

## 2015-09-18 ENCOUNTER — Other Ambulatory Visit: Payer: Managed Care, Other (non HMO)

## 2015-09-27 ENCOUNTER — Other Ambulatory Visit (INDEPENDENT_AMBULATORY_CARE_PROVIDER_SITE_OTHER): Payer: Managed Care, Other (non HMO)

## 2015-09-27 ENCOUNTER — Ambulatory Visit: Payer: Managed Care, Other (non HMO)

## 2015-09-27 ENCOUNTER — Other Ambulatory Visit: Payer: Managed Care, Other (non HMO)

## 2015-09-27 DIAGNOSIS — Z23 Encounter for immunization: Secondary | ICD-10-CM

## 2015-09-27 DIAGNOSIS — Z1159 Encounter for screening for other viral diseases: Secondary | ICD-10-CM

## 2015-09-27 MED ORDER — ZOSTER VACCINE LIVE 19400 UNT/0.65ML ~~LOC~~ SOLR
0.6500 mL | Freq: Once | SUBCUTANEOUS | Status: AC
  Administered 2015-09-27: 19400 [IU] via SUBCUTANEOUS

## 2015-09-27 MED ORDER — ZOSTER VACCINE LIVE 19400 UNT/0.65ML ~~LOC~~ SOLR: 0.6500 mL | Freq: Once | SUBCUTANEOUS | Status: DC

## 2015-09-28 LAB — HEPATITIS C ANTIBODY: HCV AB: NEGATIVE

## 2015-10-16 ENCOUNTER — Telehealth: Payer: Self-pay | Admitting: Family Medicine

## 2015-10-16 NOTE — Telephone Encounter (Signed)
°  Relation to pt: Self Call back number: [phone]   Reason for call:  Patient requesting a refill FentaNYL 37.5 MCG/HR PT72 and oxyCODONE (ROXICODONE) 15 MG immediate release tablet

## 2015-10-16 NOTE — Telephone Encounter (Signed)
Requesting:  Fentanyl and oxycodone Contract  Signed on 08/16/2015 UDS  Moderate, next is due 11/16/2015 Last OV    06/11/2015 Last Refill   Oxy. #90 with 0 refills on 09/13/2015                    Fentnyl #10 with 0 refills on 09/13/2015  Please Advise

## 2015-10-16 NOTE — Telephone Encounter (Signed)
OK to refill both meds as requestedr

## 2015-10-17 MED ORDER — OXYCODONE HCL 15 MG PO TABS: 15.0000 mg | ORAL_TABLET | Freq: Three times a day (TID) | ORAL | Status: DC | PRN

## 2015-10-17 MED ORDER — FENTANYL 37.5 MCG/HR TD PT72: 1.0000 | MEDICATED_PATCH | TRANSDERMAL | Status: DC

## 2015-10-17 NOTE — Telephone Encounter (Signed)
Rx printed and placed in Dr. Blyth's red folder for review and signature.   

## 2015-10-18 ENCOUNTER — Telehealth: Payer: Self-pay | Admitting: Family Medicine

## 2015-10-18 NOTE — Telephone Encounter (Signed)
She needs to bring back last rx then. But I do not believe we wrote it as DAW so they should just autosubstitute for the generic but since they will not then OK to rewrite with a statement saying please dispense generic

## 2015-10-18 NOTE — Telephone Encounter (Signed)
Patient also needs a new rx written for a generic form of FentaNYL 37.5 MCG/HR PT72 [id number]. States her insurance will not pay for it unless it is generic

## 2015-10-18 NOTE — Telephone Encounter (Signed)
Signed hardcopy placed at the front desk.

## 2015-10-18 NOTE — Telephone Encounter (Signed)
Caller name: Lorane Gell   Relationship to patient: Pharmacy   Can be reached: [phone]  Pharmacy: [facility]   Reason for call: He says that the pt has a new insurance since this prescription was last filled. He says that the insurance will not cover the strength. He provided a phone number to insurance if needed. [phone].   Please assist further.

## 2015-10-19 ENCOUNTER — Telehealth: Payer: Self-pay | Admitting: Family Medicine

## 2015-10-19 ENCOUNTER — Ambulatory Visit: Payer: Self-pay | Admitting: Family Medicine

## 2015-10-19 MED ORDER — FENTANYL 25 MCG/HR TD PT72
25.0000 ug | MEDICATED_PATCH | TRANSDERMAL | Status: DC
Start: 2015-10-19 — End: 2015-11-13

## 2015-10-19 MED ORDER — FENTANYL 12 MCG/HR TD PT72: 12.5000 ug | MEDICATED_PATCH | TRANSDERMAL | Status: DC

## 2015-10-19 NOTE — Telephone Encounter (Signed)
Patient informed of PCP instructions.  She will go back to the pharmacy to try and get hardcopy back and bring into PCP to change.

## 2015-10-19 NOTE — Telephone Encounter (Signed)
Called the pharmacist and her new insurance the 37.5 mg will not cover. It will however cover doing 2 prescriptions one for 25mg  and 12 mg.  He stated many folks have to do this to get the desired amount.  The pharmacist has voided the 37.5 mg to save the patient a trip. Dr. Charlett Blake agreed to change this for the patient.  Printed new prescriptions (updated list) PCP signed and called the patient informed to pickup at our office.

## 2015-10-19 NOTE — Telephone Encounter (Signed)
Called back left msg. To call back.

## 2015-10-19 NOTE — Telephone Encounter (Signed)
Pt is returning your call. Pt says that it is important that she speaks with you soon.    CB: [phone]

## 2015-10-22 NOTE — Telephone Encounter (Signed)
Pt was no show 10/19/15 3:00pm for follow up appt, pt did not reschedule, charge or no charge?

## 2015-10-22 NOTE — Telephone Encounter (Signed)
Charge and no more meds til seen

## 2015-10-23 ENCOUNTER — Encounter: Payer: Self-pay | Admitting: Family Medicine

## 2015-10-23 NOTE — Telephone Encounter (Signed)
Marked to charge and mailing letter °

## 2015-10-26 ENCOUNTER — Other Ambulatory Visit: Payer: Self-pay | Admitting: Family Medicine

## 2015-10-30 NOTE — Telephone Encounter (Signed)
No charge. 

## 2015-10-30 NOTE — Telephone Encounter (Signed)
Pt said msg was left with Neoma Laming, her roommate, for appt confirmation and she says she didn't know about the appointment. Pt rescheduled for 11/13/15. She is asking if the fee can be waived.

## 2015-10-31 NOTE — Telephone Encounter (Signed)
[place] - please do no charge for no show 10/19/15

## 2015-11-13 ENCOUNTER — Ambulatory Visit (INDEPENDENT_AMBULATORY_CARE_PROVIDER_SITE_OTHER): Payer: BLUE CROSS/BLUE SHIELD | Admitting: Family Medicine

## 2015-11-13 ENCOUNTER — Encounter: Payer: Self-pay | Admitting: Family Medicine

## 2015-11-13 VITALS — BP 124/76 | HR 76 | Temp 98.9°F | Ht 65.0 in | Wt 241.2 lb

## 2015-11-13 DIAGNOSIS — K5900 Constipation, unspecified: Secondary | ICD-10-CM | POA: Diagnosis not present

## 2015-11-13 DIAGNOSIS — R7309 Other abnormal glucose: Secondary | ICD-10-CM | POA: Diagnosis not present

## 2015-11-13 DIAGNOSIS — I10 Essential (primary) hypertension: Secondary | ICD-10-CM

## 2015-11-13 DIAGNOSIS — E785 Hyperlipidemia, unspecified: Secondary | ICD-10-CM | POA: Diagnosis not present

## 2015-11-13 MED ORDER — FENTANYL 25 MCG/HR TD PT72: 25.0000 ug | MEDICATED_PATCH | TRANSDERMAL | Status: DC

## 2015-11-13 MED ORDER — VITAMIN D (ERGOCALCIFEROL) 1.25 MG (50000 UNIT) PO CAPS: 50000.0000 [IU] | ORAL_CAPSULE | ORAL | Status: DC

## 2015-11-13 MED ORDER — METOPROLOL SUCCINATE ER 25 MG PO TB24: 25.0000 mg | ORAL_TABLET | Freq: Every day | ORAL | Status: DC

## 2015-11-13 MED ORDER — OXYCODONE HCL 15 MG PO TABS: 15.0000 mg | ORAL_TABLET | Freq: Three times a day (TID) | ORAL | Status: DC | PRN

## 2015-11-13 MED ORDER — FENTANYL 12 MCG/HR TD PT72: 12.5000 ug | MEDICATED_PATCH | TRANSDERMAL | Status: DC

## 2015-11-13 MED ORDER — LISINOPRIL 10 MG PO TABS: ORAL_TABLET | ORAL | Status: DC

## 2015-11-13 MED ORDER — TRIAMTERENE-HCTZ 37.5-25 MG PO TABS: 1.0000 | ORAL_TABLET | Freq: Every day | ORAL | Status: DC

## 2015-11-13 NOTE — Patient Instructions (Signed)

## 2015-11-13 NOTE — Assessment & Plan Note (Signed)
Well controlled, no changes to meds. Encouraged heart healthy diet such as the DASH diet and exercise as tolerated.  °

## 2015-11-13 NOTE — Progress Notes (Signed)
Pre visit review using our clinic review tool, if applicable. No additional management support is needed unless otherwise documented below in the visit note. 

## 2015-11-13 NOTE — Assessment & Plan Note (Signed)
Encouraged heart healthy diet, increase exercise, avoid trans fats, consider a krill oil cap daily 

## 2015-11-18 NOTE — Assessment & Plan Note (Signed)
Stable on current meds will be establishing with pain management

## 2015-11-18 NOTE — Progress Notes (Signed)
Patient ID: IMAYA SAUNDERS, female   DOB: Nov 20, 1960, 55 y.o.   MRN: [id number]   Subjective:    Patient ID: Timmie Foerster, female    DOB: August 25, 1961, 55 y.o.   MRN: [id number]  Chief Complaint  Patient presents with  . Follow-up    HPI Patient is in today for follow up. Continues to struggle with chronic pain but manages with current meds well. Constipation is tolerable. No recent illness or acute concerns. Denies CP/palp/SOB/HA/congestion/fevers/GI or GU c/o. Taking meds as prescribed  Past Medical History  Diagnosis Date  . Ovarian cyst, bilateral   . Chronic back pain   . Morbid obesity ([place])   . Hypertension   . Anxiety and depression     chronic  . History of fibromyalgia     suspected   . Insomnia   . Depression with anxiety 08/23/2008    Qualifier: Diagnosis of  By: Niel Hummer MD, [place] PTSD (post-traumatic stress disorder)   . Acute upper respiratory infections of unspecified site 06/12/2014  . Vitamin D deficiency 06/24/2015  . Knee pain, right 06/24/2015  . Preventative health care 06/24/2015    Past Surgical History  Procedure Laterality Date  . Ovarian cyst removal      Family History  Problem Relation Age of Onset  . ADD / ADHD Neg Hx   . Depression Neg Hx   . Alcohol abuse Neg Hx   . Drug abuse Neg Hx   . Rheum arthritis Father   . Diabetes Father   . Congestive Heart Failure Father   . Hypertension Father   . Cancer Maternal Uncle 62    colon  . Hip fracture Maternal Grandmother   . COPD Maternal Grandfather 87    colon  . Lymphoma Paternal Grandmother     Social History   Social History  . Marital Status: Divorced    Spouse Name: N/A  . Number of Children: N/A  . Years of Education: N/A   Occupational History  . Not on file.   Social History Main Topics  . Smoking status: Former Smoker    Quit date: 04/04/1989  . Smokeless tobacco: Never Used  . Alcohol Use: Yes     Comment: occassionally- 2 glassses of wine 2x/month  . Drug  Use: No     Comment: take meds as prescribed  . Sexual Activity: Not on file   Other Topics Concern  . Not on file   Social History Narrative    Outpatient Prescriptions Prior to Visit  Medication Sig Dispense Refill  . ALPRAZolam (XANAX) 1 MG tablet Take 1 tablet (1 mg total) by mouth 3 (three) times daily as needed. 90 tablet 2  . beclomethasone (BECONASE-AQ) 42 MCG/SPRAY nasal spray Place 1 spray into both nostrils 2 (two) times daily. Dose is for each nostril. 25 g 1  . sertraline (ZOLOFT) 100 MG tablet TAKE 1 TABLET (100 MG TOTAL) BY MOUTH DAILY. 30 tablet 5  . zolpidem (AMBIEN) 10 MG tablet Take 1 tablet (10 mg total) by mouth at bedtime as needed for sleep. 30 tablet 2  . fentaNYL (DURAGESIC - DOSED MCG/HR) 12 MCG/HR Place 1 patch (12.5 mcg total) onto the skin every 3 (three) days. 10 patch 0  . fentaNYL (DURAGESIC - DOSED MCG/HR) 25 MCG/HR patch Place 1 patch (25 mcg total) onto the skin every 3 (three) days. 10 patch 0  . lisinopril (PRINIVIL,ZESTRIL) 10 MG tablet TAKE 1 TABLET (10 MG TOTAL) BY  MOUTH DAILY. 90 tablet 1  . metoprolol succinate (TOPROL-XL) 25 MG 24 hr tablet TAKE 1 TABLET BY MOUTH EVERY DAY 30 tablet 6  . oxyCODONE (ROXICODONE) 15 MG immediate release tablet Take 1 tablet (15 mg total) by mouth 3 (three) times daily as needed for pain. 90 tablet 0  . triamterene-hydrochlorothiazide (MAXZIDE-25) 37.5-25 MG per tablet TAKE 1 TABLET BY MOUTH DAILY. 30 tablet 4  . Vitamin D, Ergocalciferol, (DRISDOL) 50000 UNITS CAPS capsule Take 1 capsule (50,000 Units total) by mouth every 7 (seven) days. 4 capsule 3  . amphetamine-dextroamphetamine (ADDERALL) 20 MG tablet Take 1 tablet (20 mg total) by mouth 2 (two) times daily. (Patient not taking: Reported on 11/13/2015) 60 tablet 0   No facility-administered medications prior to visit.    Allergies  Allergen Reactions  . Cymbalta [Duloxetine Hcl] Other (See Comments)    Headaches  . Fiorinal [Butalbital-Aspirin-Caffeine]  Other (See Comments)    Mental Clarity.    Review of Systems  Constitutional: Negative for fever and malaise/fatigue.  HENT: Negative for congestion.   Eyes: Negative for discharge.  Respiratory: Negative for shortness of breath.   Cardiovascular: Negative for chest pain, palpitations and leg swelling.  Gastrointestinal: Negative for nausea and abdominal pain.  Genitourinary: Negative for dysuria.  Musculoskeletal: Positive for back pain. Negative for falls.  Skin: Negative for rash.  Neurological: Negative for loss of consciousness and headaches.  Endo/Heme/Allergies: Negative for environmental allergies.  Psychiatric/Behavioral: Negative for depression. The patient is not nervous/anxious.        Objective:    Physical Exam  Constitutional: She is oriented to person, place, and time. She appears well-developed and well-nourished. No distress.  HENT:  Head: Normocephalic and atraumatic.  Nose: Nose normal.  Eyes: Right eye exhibits no discharge. Left eye exhibits no discharge.  Neck: Normal range of motion. Neck supple.  Cardiovascular: Normal rate and regular rhythm.   No murmur heard. Pulmonary/Chest: Effort normal and breath sounds normal.  Abdominal: Soft. Bowel sounds are normal. There is no tenderness.  Musculoskeletal: She exhibits no edema.  Neurological: She is alert and oriented to person, place, and time.  Skin: Skin is warm and dry.  Psychiatric: She has a normal mood and affect.  Nursing note and vitals reviewed.   BP 124/76 mmHg  Pulse 76  Temp(Src) 98.9 F (37.2 C) (Oral)  Ht 5\' 5"  (1.651 m)  Wt 241 lb 4 oz (109.43 kg)  BMI 40.15 kg/m2  SpO2 94% Wt Readings from Last 3 Encounters:  11/13/15 241 lb 4 oz (109.43 kg)  06/11/15 241 lb 3.2 oz (109.408 kg)  02/22/15 229 lb 6 oz (104.044 kg)     Lab Results  Component Value Date   WBC 6.2 06/11/2015   HGB 13.3 06/11/2015   HCT 39.8 06/11/2015   PLT 208.0 06/11/2015   GLUCOSE 106* 06/11/2015   CHOL  226* 06/11/2015   TRIG 257.0* 06/11/2015   HDL 73.80 06/11/2015   LDLDIRECT 131.0 06/11/2015   LDLCALC 84 03/06/2014   ALT 29 06/11/2015   AST 23 06/11/2015   NA 140 06/11/2015   K 3.9 06/11/2015   CL 102 06/11/2015   CREATININE 0.55 06/11/2015   BUN 13 06/11/2015   CO2 28 06/11/2015   TSH 1.92 06/11/2015   HGBA1C 5.4 06/11/2015    Lab Results  Component Value Date   TSH 1.92 06/11/2015   Lab Results  Component Value Date   WBC 6.2 06/11/2015   HGB 13.3 06/11/2015   HCT  39.8 06/11/2015   MCV 95.6 06/11/2015   PLT 208.0 06/11/2015   Lab Results  Component Value Date   NA 140 06/11/2015   K 3.9 06/11/2015   CO2 28 06/11/2015   GLUCOSE 106* 06/11/2015   BUN 13 06/11/2015   CREATININE 0.55 06/11/2015   BILITOT 0.5 06/11/2015   ALKPHOS 81 06/11/2015   AST 23 06/11/2015   ALT 29 06/11/2015   PROT 7.0 06/11/2015   ALBUMIN 4.1 06/11/2015   CALCIUM 10.8* 06/11/2015   CALCIUM 10.2 06/11/2015   GFR 122.59 06/11/2015   Lab Results  Component Value Date   CHOL 226* 06/11/2015   Lab Results  Component Value Date   HDL 73.80 06/11/2015   Lab Results  Component Value Date   LDLCALC 84 03/06/2014   Lab Results  Component Value Date   TRIG 257.0* 06/11/2015   Lab Results  Component Value Date   CHOLHDL 3 06/11/2015   Lab Results  Component Value Date   HGBA1C 5.4 06/11/2015       Assessment & Plan:   Problem List Items Addressed This Visit    Constipation    Encouraged increased hydration and fiber in diet. Daily probiotics. If bowels not moving can use MOM 2 tbls po in 4 oz of warm prune juice by mouth every 2-3 days. If no results then repeat in 4 hours with  Dulcolax suppository pr, may repeat again in 4 more hours as needed. Seek care if symptoms worsen.       Essential hypertension    Well controlled, no changes to meds. Encouraged heart healthy diet such as the DASH diet and exercise as tolerated.       Relevant Medications    triamterene-hydrochlorothiazide (MAXZIDE-25) 37.5-25 MG tablet   metoprolol succinate (TOPROL-XL) 25 MG 24 hr tablet   lisinopril (PRINIVIL,ZESTRIL) 10 MG tablet   HYPERGLYCEMIA    hgba1c acceptable, minimize simple carbs. Increase exercise as tolerated.       Hyperlipidemia - Primary    Encouraged heart healthy diet, increase exercise, avoid trans fats, consider a krill oil cap daily      Relevant Medications   triamterene-hydrochlorothiazide (MAXZIDE-25) 37.5-25 MG tablet   metoprolol succinate (TOPROL-XL) 25 MG 24 hr tablet   lisinopril (PRINIVIL,ZESTRIL) 10 MG tablet      I have changed Ms. Delagarza's triamterene-hydrochlorothiazide, metoprolol succinate, fentaNYL, and fentaNYL. I am also having her maintain her beclomethasone, amphetamine-dextroamphetamine, zolpidem, sertraline, ALPRAZolam, lisinopril, oxyCODONE, and Vitamin D (Ergocalciferol).  Meds ordered this encounter  Medications  . triamterene-hydrochlorothiazide (MAXZIDE-25) 37.5-25 MG tablet    Sig: Take 1 tablet by mouth daily.    Dispense:  30 tablet    Refill:  5  . metoprolol succinate (TOPROL-XL) 25 MG 24 hr tablet    Sig: Take 1 tablet (25 mg total) by mouth daily.    Dispense:  30 tablet    Refill:  5  . lisinopril (PRINIVIL,ZESTRIL) 10 MG tablet    Sig: TAKE 1 TABLET (10 MG TOTAL) BY MOUTH DAILY.    Dispense:  30 tablet    Refill:  5  . oxyCODONE (ROXICODONE) 15 MG immediate release tablet    Sig: Take 1 tablet (15 mg total) by mouth 3 (three) times daily as needed for pain.    Dispense:  90 tablet    Refill:  0  . Vitamin D, Ergocalciferol, (DRISDOL) 50000 units CAPS capsule    Sig: Take 1 capsule (50,000 Units total) by mouth every 7 (seven) days.  Dispense:  4 capsule    Refill:  3  . fentaNYL (DURAGESIC - DOSED MCG/HR) 25 MCG/HR patch    Sig: Place 1 patch (25 mcg total) onto the skin every 3 (three) days. Fill 11/19/2015    Dispense:  10 patch    Refill:  0  . fentaNYL (DURAGESIC - DOSED MCG/HR)  12 MCG/HR    Sig: Place 1 patch (12.5 mcg total) onto the skin every 3 (three) days. Can fill on 11/19/2015    Dispense:  10 patch    Refill:  0     Penni Homans, MD

## 2015-11-18 NOTE — Assessment & Plan Note (Signed)
hgba1c acceptable, minimize simple carbs. Increase exercise as tolerated.  

## 2015-11-18 NOTE — Assessment & Plan Note (Signed)
Encouraged increased hydration and fiber in diet. Daily probiotics. If bowels not moving can use MOM 2 tbls po in 4 oz of warm prune juice by mouth every 2-3 days. If no results then repeat in 4 hours with  Dulcolax suppository pr, may repeat again in 4 more hours as needed. Seek care if symptoms worsen 

## 2015-12-07 ENCOUNTER — Other Ambulatory Visit: Payer: Self-pay | Admitting: Family Medicine

## 2015-12-10 NOTE — Telephone Encounter (Signed)
Faxed hardcopy for Alprazolam to [facility] in [facility]

## 2015-12-11 ENCOUNTER — Other Ambulatory Visit: Payer: Self-pay | Admitting: Family Medicine

## 2015-12-12 ENCOUNTER — Other Ambulatory Visit: Payer: Self-pay | Admitting: Family Medicine

## 2015-12-12 NOTE — Telephone Encounter (Signed)
Pt called in because she says that she need a medication refill on her oxyCODONE and also fentaNYL Rx. Pt says that she would like to speak with CMA first before requesting refill.     CB: [phone]

## 2015-12-13 MED ORDER — OXYCODONE HCL 15 MG PO TABS: 15.0000 mg | ORAL_TABLET | Freq: Three times a day (TID) | ORAL | Status: DC | PRN

## 2015-12-13 MED ORDER — FENTANYL 12 MCG/HR TD PT72: 12.5000 ug | MEDICATED_PATCH | TRANSDERMAL | Status: DC

## 2015-12-13 MED ORDER — FENTANYL 25 MCG/HR TD PT72: 25.0000 ug | MEDICATED_PATCH | TRANSDERMAL | Status: DC

## 2015-12-13 NOTE — Telephone Encounter (Signed)
Called the patient informed to pickup hardcopy's at the front desk. 

## 2015-12-13 NOTE — Telephone Encounter (Signed)
Spoke to the pateint and she got her oxycodone refill on the 18th of February and patches on the 21st of February. Advise on refills of all as she is hoping to get to pickup at the same time as to not make so many trips

## 2016-01-09 ENCOUNTER — Telehealth: Payer: Self-pay | Admitting: Family Medicine

## 2016-01-09 MED ORDER — OXYCODONE HCL 15 MG PO TABS: 15.0000 mg | ORAL_TABLET | Freq: Three times a day (TID) | ORAL | Status: DC | PRN

## 2016-01-09 NOTE — Telephone Encounter (Signed)
Pt is requesting refill on Oxycodone and Fentanyl. Dr. Frederik Pear Pt.  Last OV: 11/13/2015 Last Fill on Oxycodone: 12/13/2015 #90 and 0RF Last Fill on Fentanyl: 12/13/2015 #10 and 0RF UDS: 08/16/2015 Moderate risk  Please advise .

## 2016-01-09 NOTE — Telephone Encounter (Signed)
I did rx oxycodone. But very limited rx. Covering for Dr. Charlett Blake. Classified as moderate risk. Friend is going to pick up rx. Will give limited number of oxycodone to provide some pain reflief. Don't feel comfortable giving fentanyl.

## 2016-01-09 NOTE — Telephone Encounter (Signed)
Can be reached: [phone] ok to leave msg on machine that RX is ready (not with roommate bc she may forget)  Reason for call: pt called for refill on oxycodone and fentanyl. She had a death in the family and will need to have a roommate come pick up the RX [name]).

## 2016-01-09 NOTE — Telephone Encounter (Signed)
Spoke with friend on DPR that pt gives permission to pick up the medication and her friend Ms. Danelle Earthly was aware that Rx was ready for pickup.

## 2016-01-14 ENCOUNTER — Telehealth: Payer: Self-pay

## 2016-01-14 MED ORDER — FENTANYL 25 MCG/HR TD PT72: 25.0000 ug | MEDICATED_PATCH | TRANSDERMAL | Status: DC

## 2016-01-14 MED ORDER — OXYCODONE HCL 15 MG PO TABS: 15.0000 mg | ORAL_TABLET | Freq: Three times a day (TID) | ORAL | Status: DC | PRN

## 2016-01-14 MED ORDER — FENTANYL 12 MCG/HR TD PT72: 12.5000 ug | MEDICATED_PATCH | TRANSDERMAL | Status: DC

## 2016-01-14 NOTE — Telephone Encounter (Addendum)
Can you please clarify which fentalyn patch to refill she is on 85mcg and 12 mcg. Please clarify.

## 2016-01-14 NOTE — Telephone Encounter (Signed)
Patient has been notified to pick up prescription said she may send someone to pic up today.

## 2016-01-14 NOTE — Telephone Encounter (Signed)
Refilled patient meds with #90 and 0 rf on oxycodone and patches #10 with 0rf

## 2016-01-14 NOTE — Telephone Encounter (Signed)
OK to refill Fentanyl and Oxycodone with same sig and same number

## 2016-01-14 NOTE — Telephone Encounter (Signed)
Pt says that it was for both 25 mcg and also 12 mcg .

## 2016-01-14 NOTE — Telephone Encounter (Signed)
Pt called back in to request a refill on medications now that PCP is back in the office. 1. Oxycodone 2.Fentanyl.

## 2016-01-14 NOTE — Addendum Note (Signed)
Addended by: Ricky Ala on: 01/14/2016 03:58 PM   Modules accepted: Orders

## 2016-01-14 NOTE — Telephone Encounter (Signed)
Pt is requesting a call back for confirmation. Pt is very anxious about getting her medication.   CB: [phone]

## 2016-02-12 ENCOUNTER — Encounter: Payer: Self-pay | Admitting: Family Medicine

## 2016-02-12 ENCOUNTER — Ambulatory Visit (INDEPENDENT_AMBULATORY_CARE_PROVIDER_SITE_OTHER): Payer: BLUE CROSS/BLUE SHIELD | Admitting: Family Medicine

## 2016-02-12 VITALS — BP 124/78 | HR 62 | Temp 98.6°F | Ht 65.0 in | Wt 240.4 lb

## 2016-02-12 DIAGNOSIS — Z0000 Encounter for general adult medical examination without abnormal findings: Secondary | ICD-10-CM

## 2016-02-12 DIAGNOSIS — E039 Hypothyroidism, unspecified: Secondary | ICD-10-CM

## 2016-02-12 DIAGNOSIS — I10 Essential (primary) hypertension: Secondary | ICD-10-CM

## 2016-02-12 DIAGNOSIS — R7309 Other abnormal glucose: Secondary | ICD-10-CM

## 2016-02-12 DIAGNOSIS — E559 Vitamin D deficiency, unspecified: Secondary | ICD-10-CM

## 2016-02-12 DIAGNOSIS — M549 Dorsalgia, unspecified: Secondary | ICD-10-CM

## 2016-02-12 DIAGNOSIS — E785 Hyperlipidemia, unspecified: Secondary | ICD-10-CM

## 2016-02-12 MED ORDER — ALPRAZOLAM 1 MG PO TABS: 1.0000 mg | ORAL_TABLET | Freq: Three times a day (TID) | ORAL | Status: DC

## 2016-02-12 MED ORDER — FENTANYL 25 MCG/HR TD PT72: 25.0000 ug | MEDICATED_PATCH | TRANSDERMAL | Status: DC

## 2016-02-12 MED ORDER — OXYCODONE HCL 15 MG PO TABS: 15.0000 mg | ORAL_TABLET | Freq: Three times a day (TID) | ORAL | Status: DC | PRN

## 2016-02-12 MED ORDER — FENTANYL 12 MCG/HR TD PT72: 12.5000 ug | MEDICATED_PATCH | TRANSDERMAL | Status: DC

## 2016-02-12 MED ORDER — ZOLPIDEM TARTRATE 10 MG PO TABS: 10.0000 mg | ORAL_TABLET | Freq: Every evening | ORAL | Status: DC | PRN

## 2016-02-12 NOTE — Progress Notes (Signed)
Pre visit review using our clinic review tool, if applicable. No additional management support is needed unless otherwise documented below in the visit note. 

## 2016-02-12 NOTE — Assessment & Plan Note (Signed)
Patient reports a greater than 90% pain improvement after starting a mushroom supplement. Did have a fall yesterday when her dog tripped her. She is doing better even after the fall

## 2016-02-12 NOTE — Patient Instructions (Signed)
Insomnia Insomnia is a sleep disorder that makes it difficult to fall asleep or to stay asleep. Insomnia can cause tiredness (fatigue), low energy, difficulty concentrating, mood swings, and poor performance at work or school.  There are three different ways to classify insomnia:  Difficulty falling asleep.  Difficulty staying asleep.  Waking up too early in the morning. Any type of insomnia can be long-term (chronic) or short-term (acute). Both are common. Short-term insomnia usually lasts for three months or less. Chronic insomnia occurs at least three times a week for longer than three months. CAUSES  Insomnia may be caused by another condition, situation, or substance, such as:  Anxiety.  Certain medicines.  Gastroesophageal reflux disease (GERD) or other gastrointestinal conditions.  Asthma or other breathing conditions.  Restless legs syndrome, sleep apnea, or other sleep disorders.  Chronic pain.  Menopause. This may include hot flashes.  Stroke.  Abuse of alcohol, tobacco, or illegal drugs.  Depression.  Caffeine.   Neurological disorders, such as Alzheimer disease.  An overactive thyroid (hyperthyroidism). The cause of insomnia may not be known. RISK FACTORS Risk factors for insomnia include:  Gender. Women are more commonly affected than men.  Age. Insomnia is more common as you get older.  Stress. This may involve your professional or personal life.  Income. Insomnia is more common in people with lower income.  Lack of exercise.   Irregular work schedule or night shifts.  Traveling between different time zones. SIGNS AND SYMPTOMS If you have insomnia, trouble falling asleep or trouble staying asleep is the main symptom. This may lead to other symptoms, such as:  Feeling fatigued.  Feeling nervous about going to sleep.  Not feeling rested in the morning.  Having trouble concentrating.  Feeling irritable, anxious, or depressed. TREATMENT   Treatment for insomnia depends on the cause. If your insomnia is caused by an underlying condition, treatment will focus on addressing the condition. Treatment may also include:   Medicines to help you sleep.  Counseling or therapy.  Lifestyle adjustments. HOME CARE INSTRUCTIONS   Take medicines only as directed by your health care provider.  Keep regular sleeping and waking hours. Avoid naps.  Keep a sleep diary to help you and your health care provider figure out what could be causing your insomnia. Include:   When you sleep.  When you wake up during the night.  How well you sleep.   How rested you feel the next day.  Any side effects of medicines you are taking.  What you eat and drink.   Make your bedroom a comfortable place where it is easy to fall asleep:  Put up shades or special blackout curtains to block light from outside.  Use a white noise machine to block noise.  Keep the temperature cool.   Exercise regularly as directed by your health care provider. Avoid exercising right before bedtime.  Use relaxation techniques to manage stress. Ask your health care provider to suggest some techniques that may work well for you. These may include:  Breathing exercises.  Routines to release muscle tension.  Visualizing peaceful scenes.  Cut back on alcohol, caffeinated beverages, and cigarettes, especially close to bedtime. These can disrupt your sleep.  Do not overeat or eat spicy foods right before bedtime. This can lead to digestive discomfort that can make it hard for you to sleep.  Limit screen use before bedtime. This includes:  Watching TV.  Using your smartphone, tablet, and computer.  Stick to a routine. This   can help you fall asleep faster. Try to do a quiet activity, brush your teeth, and go to bed at the same time each night.  Get out of bed if you are still awake after 15 minutes of trying to sleep. Keep the lights down, but try reading or  doing a quiet activity. When you feel sleepy, go back to bed.  Make sure that you drive carefully. Avoid driving if you feel very sleepy.  Keep all follow-up appointments as directed by your health care provider. This is important. SEEK MEDICAL CARE IF:   You are tired throughout the day or have trouble in your daily routine due to sleepiness.  You continue to have sleep problems or your sleep problems get worse. SEEK IMMEDIATE MEDICAL CARE IF:   You have serious thoughts about hurting yourself or someone else.   This information is not intended to replace advice given to you by your health care provider. Make sure you discuss any questions you have with your health care provider.   Document Released: 09/12/2000 Document Revised: 06/06/2015 Document Reviewed: 06/16/2014 Elsevier Interactive Patient Education 2016 [employer]  

## 2016-02-12 NOTE — Assessment & Plan Note (Signed)
Encouraged DASH diet, decrease po intake and increase exercise as tolerated. Needs 7-8 hours of sleep nightly. Avoid trans fats, eat small, frequent meals every 4-5 hours with lean proteins, complex carbs and healthy fats. Minimize simple carbs 

## 2016-02-12 NOTE — Assessment & Plan Note (Signed)
i

## 2016-02-12 NOTE — Assessment & Plan Note (Signed)
Mild, has improved

## 2016-02-12 NOTE — Progress Notes (Signed)
Subjective:    Patient ID: [name] [name], female    DOB: [birth year], 55 y.o.   MRN: [id number]  Chief Complaint  Patient presents with  . Follow-up    HPI Patient is in today for follow up.   Patient has some concerns with sleep medications.  Patient has been having some issue with Right knee has been diagnosed with arthritis, is currently following up with [facility], but still has been struggling with constant knee pain has been taking a mushroom supplement that has improved her knee pain.  Patient also reports having a recent fall on mothers day, twisted her right foot which caused the knee to flare some but nothing more than usual.  Denies CP/palp/SOB/HA/congestion/fevers/GI or GU c/o. Taking meds as prescribed.   Past Medical History  Diagnosis Date  . Ovarian cyst, bilateral   . Chronic back pain   . Morbid obesity ([place])   . Hypertension   . Anxiety and depression     chronic  . History of fibromyalgia     suspected   . Insomnia   . Depression with anxiety 08/23/2008    Qualifier: Diagnosis of  By: Niel Hummer MD, [place] PTSD (post-traumatic stress disorder)   . Acute upper respiratory infections of unspecified site 06/12/2014  . Vitamin D deficiency 06/24/2015  . Knee pain, right 06/24/2015  . Preventative health care 06/24/2015    Past Surgical History  Procedure Laterality Date  . Ovarian cyst removal      Family History  Problem Relation Age of Onset  . ADD / ADHD Neg Hx   . Depression Neg Hx   . Alcohol abuse Neg Hx   . Drug abuse Neg Hx   . Rheum arthritis Father   . Diabetes Father   . Congestive Heart Failure Father   . Hypertension Father   . Cancer Maternal Uncle 84    colon  . Hip fracture Maternal Grandmother   . COPD Maternal Grandfather 87    colon  . Lymphoma Paternal Grandmother     Social History   Social History  . Marital Status: Divorced    Spouse Name: N/A  . Number of Children: N/A  . Years of Education: N/A    Occupational History  . Not on file.   Social History Main Topics  . Smoking status: Former Smoker    Quit date: 04/04/1989  . Smokeless tobacco: Never Used  . Alcohol Use: Yes     Comment: occassionally- 2 glassses of wine 2x/month  . Drug Use: No     Comment: take meds as prescribed  . Sexual Activity: Not on file   Other Topics Concern  . Not on file   Social History Narrative    Outpatient Prescriptions Prior to Visit  Medication Sig Dispense Refill  . amphetamine-dextroamphetamine (ADDERALL) 20 MG tablet Take 1 tablet (20 mg total) by mouth 2 (two) times daily. 60 tablet 0  . beclomethasone (BECONASE-AQ) 42 MCG/SPRAY nasal spray Place 1 spray into both nostrils 2 (two) times daily. Dose is for each nostril. 25 g 1  . lisinopril (PRINIVIL,ZESTRIL) 10 MG tablet TAKE 1 TABLET (10 MG TOTAL) BY MOUTH DAILY. 30 tablet 5  . metoprolol succinate (TOPROL-XL) 25 MG 24 hr tablet Take 1 tablet (25 mg total) by mouth daily. 30 tablet 5  . sertraline (ZOLOFT) 100 MG tablet TAKE 1 TABLET (100 MG TOTAL) BY MOUTH DAILY. 30 tablet 5  . triamterene-hydrochlorothiazide (MAXZIDE-25) 37.5-25 MG  tablet Take 1 tablet by mouth daily. 30 tablet 5  . Vitamin D, Ergocalciferol, (DRISDOL) 50000 units CAPS capsule Take 1 capsule (50,000 Units total) by mouth every 7 (seven) days. 4 capsule 3  . ALPRAZolam (XANAX) 1 MG tablet TAKE 1 TABLET THREE TIMES A DAY 90 tablet 2  . fentaNYL (DURAGESIC - DOSED MCG/HR) 12 MCG/HR Place 1 patch (12.5 mcg total) onto the skin every 3 (three) days. Can fill on 11/19/2015 10 patch 0  . fentaNYL (DURAGESIC - DOSED MCG/HR) 25 MCG/HR patch Place 1 patch (25 mcg total) onto the skin every 3 (three) days. Fill 11/19/2015 10 patch 0  . oxyCODONE (ROXICODONE) 15 MG immediate release tablet Take 1 tablet (15 mg total) by mouth 3 (three) times daily as needed for pain. 90 tablet 0  . zolpidem (AMBIEN) 10 MG tablet Take 1 tablet (10 mg total) by mouth at bedtime as needed for sleep.  30 tablet 2   No facility-administered medications prior to visit.    Allergies  Allergen Reactions  . Cymbalta [Duloxetine Hcl] Other (See Comments)    Headaches  . Fiorinal [Butalbital-Aspirin-Caffeine] Other (See Comments)    Mental Clarity.    Review of Systems  Constitutional: Negative for fever and malaise/fatigue.  HENT: Negative for congestion.   Eyes: Negative for blurred vision.  Respiratory: Negative for shortness of breath.   Cardiovascular: Negative for chest pain, palpitations and leg swelling.  Gastrointestinal: Negative for nausea, abdominal pain and blood in stool.  Genitourinary: Negative for dysuria and frequency.  Musculoskeletal: Positive for joint pain. Negative for falls.  Skin: Negative for rash.  Neurological: Negative for dizziness, loss of consciousness and headaches.  Endo/Heme/Allergies: Negative for environmental allergies.  Psychiatric/Behavioral: Negative for depression. The patient has insomnia. The patient is not nervous/anxious.        Objective:    Physical Exam  Constitutional: She is oriented to person, place, and time. She appears well-developed and well-nourished. No distress.  HENT:  Head: Normocephalic and atraumatic.  Eyes: Conjunctivae are normal.  Neck: Neck supple. No thyromegaly present.  Cardiovascular: Normal rate, regular rhythm and normal heart sounds.   No murmur heard. Pulmonary/Chest: Effort normal and breath sounds normal. No respiratory distress.  Abdominal: Soft. Bowel sounds are normal. She exhibits no distension and no mass. There is no tenderness.  Musculoskeletal: She exhibits no edema.  Lymphadenopathy:    She has no cervical adenopathy.  Neurological: She is alert and oriented to person, place, and time.  Skin: Skin is warm and dry.  Psychiatric: She has a normal mood and affect. Her behavior is normal.    BP 124/78 mmHg  Pulse 62  Temp(Src) 98.6 F (37 C) (Oral)  Ht 5\' 5"  (1.651 m)  Wt 240 lb 6 oz  (109.033 kg)  BMI 40.00 kg/m2  SpO2 96% Wt Readings from Last 3 Encounters:  02/12/16 240 lb 6 oz (109.033 kg)  11/13/15 241 lb 4 oz (109.43 kg)  06/11/15 241 lb 3.2 oz (109.408 kg)     Lab Results  Component Value Date   WBC 7.2 02/12/2016   HGB 13.8 02/12/2016   HCT 41.0 02/12/2016   PLT 214.0 02/12/2016   GLUCOSE 101* 02/12/2016   CHOL 258* 02/12/2016   TRIG 311.0* 02/12/2016   HDL 52.10 02/12/2016   LDLDIRECT 136.0 02/12/2016   LDLCALC 84 03/06/2014   ALT 66* 02/12/2016   AST 56* 02/12/2016   NA 138 02/12/2016   K 4.8 02/12/2016   CL 102 02/12/2016  CREATININE 0.59 02/12/2016   BUN 17 02/12/2016   CO2 29 02/12/2016   TSH 2.37 02/12/2016   HGBA1C 5.4 06/11/2015    Lab Results  Component Value Date   TSH 2.37 02/12/2016   Lab Results  Component Value Date   WBC 7.2 02/12/2016   HGB 13.8 02/12/2016   HCT 41.0 02/12/2016   MCV 92.6 02/12/2016   PLT 214.0 02/12/2016   Lab Results  Component Value Date   NA 138 02/12/2016   K 4.8 02/12/2016   CO2 29 02/12/2016   GLUCOSE 101* 02/12/2016   BUN 17 02/12/2016   CREATININE 0.59 02/12/2016   BILITOT 0.5 02/12/2016   ALKPHOS 71 02/12/2016   AST 56* 02/12/2016   ALT 66* 02/12/2016   PROT 7.2 02/12/2016   ALBUMIN 4.4 02/12/2016   CALCIUM 10.8* 02/12/2016   GFR 112.77 02/12/2016   Lab Results  Component Value Date   CHOL 258* 02/12/2016   Lab Results  Component Value Date   HDL 52.10 02/12/2016   Lab Results  Component Value Date   LDLCALC 84 03/06/2014   Lab Results  Component Value Date   TRIG 311.0* 02/12/2016   Lab Results  Component Value Date   CHOLHDL 5 02/12/2016   Lab Results  Component Value Date   HGBA1C 5.4 06/11/2015       Assessment & Plan:   Problem List Items Addressed This Visit    Vitamin D deficiency   Relevant Orders   CBC (Completed)   TSH (Completed)   Lipid panel (Completed)   Comprehensive metabolic panel (Completed)   VITAMIN D 25 Hydroxy (Vit-D  Deficiency, Fractures) (Completed)   Preventative health care   Relevant Orders   CBC (Completed)   TSH (Completed)   Lipid panel (Completed)   Comprehensive metabolic panel (Completed)   VITAMIN D 25 Hydroxy (Vit-D Deficiency, Fractures) (Completed)   MORBID OBESITY    Encouraged DASH diet, decrease po intake and increase exercise as tolerated. Needs 7-8 hours of sleep nightly. Avoid trans fats, eat small, frequent meals every 4-5 hours with lean proteins, complex carbs and healthy fats. Minimize simple carbs      Relevant Orders   CBC (Completed)   TSH (Completed)   Lipid panel (Completed)   Comprehensive metabolic panel (Completed)   VITAMIN D 25 Hydroxy (Vit-D Deficiency, Fractures) (Completed)   Hypothyroidism   Relevant Orders   CBC (Completed)   TSH (Completed)   Lipid panel (Completed)   Comprehensive metabolic panel (Completed)   VITAMIN D 25 Hydroxy (Vit-D Deficiency, Fractures) (Completed)   Hyperlipidemia   Relevant Orders   CBC (Completed)   TSH (Completed)   Lipid panel (Completed)   Comprehensive metabolic panel (Completed)   VITAMIN D 25 Hydroxy (Vit-D Deficiency, Fractures) (Completed)   HYPERGLYCEMIA    minimize simple carbs. Increase exercise as tolerated.       Relevant Orders   CBC (Completed)   TSH (Completed)   Lipid panel (Completed)   Comprehensive metabolic panel (Completed)   VITAMIN D 25 Hydroxy (Vit-D Deficiency, Fractures) (Completed)   Hypercalcemia    i       Relevant Orders   CBC (Completed)   TSH (Completed)   Lipid panel (Completed)   Comprehensive metabolic panel (Completed)   VITAMIN D 25 Hydroxy (Vit-D Deficiency, Fractures) (Completed)   Essential hypertension - Primary    Well controlled, no changes to meds. Encouraged heart healthy diet such as the DASH diet and exercise as tolerated.  Relevant Orders   CBC (Completed)   TSH (Completed)   Lipid panel (Completed)   Comprehensive metabolic panel (Completed)    VITAMIN D 25 Hydroxy (Vit-D Deficiency, Fractures) (Completed)   Backache    Encouraged moist heat and gentle stretching as tolerated. May try NSAIDs and prescription meds as directed and report if symptoms worsen or seek immediate care      Relevant Medications   oxyCODONE (ROXICODONE) 15 MG immediate release tablet   fentaNYL (DURAGESIC - DOSED MCG/HR) 25 MCG/HR patch   fentaNYL (DURAGESIC - DOSED MCG/HR) 12 MCG/HR      I have changed Ms. Ealey's fentaNYL, fentaNYL, and ALPRAZolam. I am also having her maintain her beclomethasone, amphetamine-dextroamphetamine, sertraline, triamterene-hydrochlorothiazide, metoprolol succinate, lisinopril, Vitamin D (Ergocalciferol), oxyCODONE, and zolpidem.  Meds ordered this encounter  Medications  . oxyCODONE (ROXICODONE) 15 MG immediate release tablet    Sig: Take 1 tablet (15 mg total) by mouth 3 (three) times daily as needed for pain.    Dispense:  90 tablet    Refill:  0  . fentaNYL (DURAGESIC - DOSED MCG/HR) 25 MCG/HR patch    Sig: Place 1 patch (25 mcg total) onto the skin every 3 (three) days.    Dispense:  10 patch    Refill:  0  . fentaNYL (DURAGESIC - DOSED MCG/HR) 12 MCG/HR    Sig: Place 1 patch (12.5 mcg total) onto the skin every 3 (three) days.    Dispense:  10 patch    Refill:  0  . ALPRAZolam (XANAX) 1 MG tablet    Sig: Take 1 tablet (1 mg total) by mouth 3 (three) times daily.    Dispense:  90 tablet    Refill:  2  . zolpidem (AMBIEN) 10 MG tablet    Sig: Take 1 tablet (10 mg total) by mouth at bedtime as needed for sleep.    Dispense:  30 tablet    Refill:  2     Penni Homans, MD

## 2016-02-13 LAB — LIPID PANEL
CHOL/HDL RATIO: 5
Cholesterol: 258 mg/dL — ABNORMAL HIGH (ref 0–200)
HDL: 52.1 mg/dL (ref >39.00)
NONHDL: 205.7
TRIGLYCERIDES: 311 mg/dL — AB (ref 0.0–149.0)
VLDL: 62.2 mg/dL — ABNORMAL HIGH (ref 0.0–40.0)

## 2016-02-13 LAB — CBC
HEMATOCRIT: 41 % (ref 36.0–46.0)
HEMOGLOBIN: 13.8 g/dL (ref 12.0–15.0)
MCHC: 33.7 g/dL (ref 30.0–36.0)
MCV: 92.6 fl (ref 78.0–100.0)
PLATELETS: 214 10*3/uL (ref 150.0–400.0)
RBC: 4.43 Mil/uL (ref 3.87–5.11)
RDW: 14.6 % (ref 11.5–15.5)
WBC: 7.2 10*3/uL (ref 4.0–10.5)

## 2016-02-13 LAB — COMPREHENSIVE METABOLIC PANEL
ALT: 66 U/L — ABNORMAL HIGH (ref 0–35)
AST: 56 U/L — ABNORMAL HIGH (ref 0–37)
Albumin: 4.4 g/dL (ref 3.5–5.2)
Alkaline Phosphatase: 71 U/L (ref 39–117)
BUN: 17 mg/dL (ref 6–23)
CHLORIDE: 102 meq/L (ref 96–112)
CO2: 29 meq/L (ref 19–32)
Calcium: 10.8 mg/dL — ABNORMAL HIGH (ref 8.4–10.5)
Creatinine, Ser: 0.59 mg/dL (ref 0.40–1.20)
GFR: 112.77 mL/min (ref >60.00)
GLUCOSE: 101 mg/dL — AB (ref 70–99)
POTASSIUM: 4.8 meq/L (ref 3.5–5.1)
SODIUM: 138 meq/L (ref 135–145)
Total Bilirubin: 0.5 mg/dL (ref 0.2–1.2)
Total Protein: 7.2 g/dL (ref 6.0–8.3)

## 2016-02-13 LAB — TSH: TSH: 2.37 u[IU]/mL (ref 0.35–4.50)

## 2016-02-13 LAB — LDL CHOLESTEROL, DIRECT: Direct LDL: 136 mg/dL

## 2016-02-13 LAB — VITAMIN D 25 HYDROXY (VIT D DEFICIENCY, FRACTURES): VITD: 39.38 ng/mL (ref 30.00–100.00)

## 2016-02-14 ENCOUNTER — Other Ambulatory Visit: Payer: Self-pay | Admitting: Family Medicine

## 2016-02-14 MED ORDER — COLESEVELAM HCL 625 MG PO TABS: ORAL_TABLET | ORAL | Status: DC

## 2016-02-16 DIAGNOSIS — S61215A Laceration without foreign body of left ring finger without damage to nail, initial encounter: Secondary | ICD-10-CM | POA: Diagnosis not present

## 2016-02-17 NOTE — Assessment & Plan Note (Signed)
Well controlled, no changes to meds. Encouraged heart healthy diet such as the DASH diet and exercise as tolerated.  °

## 2016-02-17 NOTE — Assessment & Plan Note (Signed)
minimize simple carbs. Increase exercise as tolerated.  

## 2016-02-17 NOTE — Assessment & Plan Note (Signed)
Encouraged moist heat and gentle stretching as tolerated. May try NSAIDs and prescription meds as directed and report if symptoms worsen or seek immediate care 

## 2016-02-20 ENCOUNTER — Telehealth: Payer: Self-pay | Admitting: *Deleted

## 2016-02-20 NOTE — Telephone Encounter (Signed)
Opened in error

## 2016-02-20 NOTE — Telephone Encounter (Signed)
PA initiated on [email or web address] of [place]. Awaiting determination. JG//CMA

## 2016-02-26 MED ORDER — CHOLESTYRAMINE 4 G PO PACK: 4.0000 g | PACK | Freq: Two times a day (BID) | ORAL | Status: DC

## 2016-02-26 NOTE — Addendum Note (Signed)
Addended by: Sharon Seller B on: 02/26/2016 02:40 PM   Modules accepted: Orders, Medications

## 2016-02-26 NOTE — Telephone Encounter (Signed)
PA for Welchol denied.  Covered alternatives include colestipol and cholestyramine. Please advise. JG//CMA

## 2016-02-26 NOTE — Telephone Encounter (Signed)
Ok to rx cholstyramine 4 gm packet in liquid po bid disp 30 day supply with 3 rf. D/c welchol

## 2016-02-26 NOTE — Telephone Encounter (Signed)
Updated medication list. Sent in new medication. Patient informed

## 2016-02-27 ENCOUNTER — Telehealth: Payer: Self-pay | Admitting: Family Medicine

## 2016-02-27 NOTE — Telephone Encounter (Signed)
error:[id number] ° °

## 2016-03-05 ENCOUNTER — Other Ambulatory Visit: Payer: Self-pay | Admitting: Family Medicine

## 2016-03-05 NOTE — Telephone Encounter (Signed)
Dr Charlett Blake, last vitamin D check 02/13/16 and in normal range. Do you want pt to continue weekly Rx?

## 2016-03-07 ENCOUNTER — Other Ambulatory Visit: Payer: Self-pay | Admitting: Family Medicine

## 2016-03-11 ENCOUNTER — Telehealth: Payer: Self-pay | Admitting: Family Medicine

## 2016-03-11 NOTE — Telephone Encounter (Signed)
Requesting:  Oxycodone and Fentanyl Contract   Signed on 08/16/2015 UDS  Moderate--overdue Last OV  02/12/2016 Last Refill   Fentanyl  #10 with 0 refills on 02/12/2016                    Ixycodone  #90 with 0 refills on 02/12/2016  Please Advise

## 2016-03-11 NOTE — Telephone Encounter (Signed)
Relation to [id number] Call back number:[phone]   Reason for call:  Patient requesting a refill oxyCODONE (ROXICODONE) 15 MG immediate release tablet and fentaNYL (DURAGESIC - DOSED MCG/HR) 12 MCG/HR

## 2016-03-12 MED ORDER — FENTANYL 25 MCG/HR TD PT72: 25.0000 ug | MEDICATED_PATCH | TRANSDERMAL | Status: DC

## 2016-03-12 MED ORDER — OXYCODONE HCL 15 MG PO TABS: 15.0000 mg | ORAL_TABLET | Freq: Three times a day (TID) | ORAL | Status: DC | PRN

## 2016-03-12 MED ORDER — FENTANYL 12 MCG/HR TD PT72: 12.5000 ug | MEDICATED_PATCH | TRANSDERMAL | Status: DC

## 2016-03-12 NOTE — Telephone Encounter (Signed)
I will be there this afternoon so if we could get these printed I will sign them today. THX

## 2016-03-12 NOTE — Telephone Encounter (Signed)
Patient informed, understood & agreed to p/u scripts during regular business hours/SLS 06/14

## 2016-03-12 NOTE — Telephone Encounter (Signed)
Rx printed and placed in Dr. Blyth's red folder for review and signature.   

## 2016-04-07 ENCOUNTER — Other Ambulatory Visit: Payer: Self-pay | Admitting: Family Medicine

## 2016-04-07 MED ORDER — FENTANYL 12 MCG/HR TD PT72: 12.5000 ug | MEDICATED_PATCH | TRANSDERMAL | Status: DC

## 2016-04-07 MED ORDER — OXYCODONE HCL 15 MG PO TABS: 15.0000 mg | ORAL_TABLET | Freq: Three times a day (TID) | ORAL | Status: DC | PRN

## 2016-04-07 MED ORDER — FENTANYL 25 MCG/HR TD PT72: 25.0000 ug | MEDICATED_PATCH | TRANSDERMAL | Status: DC

## 2016-04-07 NOTE — Telephone Encounter (Signed)
°  Relationship to patient: Self Can be reached: [phone]    Reason for call: Request refill on oxyCODONE (ROXICODONE) 15 MG immediate release tablet [id number] and fentaNYL ([place] - DOSED MCG/HR) 12 MCG/HR [id number]

## 2016-04-07 NOTE — Telephone Encounter (Signed)
Last refill on Oxycodone #90 on 03/12/2016 Last refill on fentanyl patch  #10 on 03/12/2016 Last office visit 02/12/2016 Contract--08/07/2014---Moderate,  Due UDS

## 2016-04-08 NOTE — Telephone Encounter (Signed)
Patient contacted that hardcopy's are ready for pickup at the front desk.

## 2016-04-29 ENCOUNTER — Other Ambulatory Visit: Payer: Self-pay | Admitting: Family Medicine

## 2016-05-02 ENCOUNTER — Other Ambulatory Visit: Payer: Self-pay | Admitting: *Deleted

## 2016-05-02 MED ORDER — SERTRALINE HCL 100 MG PO TABS: ORAL_TABLET | ORAL | 1 refills | Status: DC

## 2016-05-02 NOTE — Telephone Encounter (Signed)
Rx sent to the pharmacy by e-script.//AB/CMA 

## 2016-05-06 ENCOUNTER — Telehealth: Payer: Self-pay | Admitting: Family Medicine

## 2016-05-06 NOTE — Telephone Encounter (Signed)
Requesting: Oxycodone and fentanyl Contract   08/16/2015 UDS  Moderate Last OV   02/12/2016 Last Refill   Fentanyl  #10 with 0 refills on 04/07/2016                    Oxycodone  #90 with 0 refills on 04/07/2016  Please Advise

## 2016-05-06 NOTE — Telephone Encounter (Signed)
Reviewed [place] Controlled Substance Database. Last fills of these Rx filled on 04/10/16. She would be due for refill on 05/10/16. As such I will defer refills to her PCP who will return on 05/08/16. Please inform patient.

## 2016-05-06 NOTE — Telephone Encounter (Signed)
°  Relationship to patient: Self Can be reached: [phone]   Reason for call: Request refill on fentaNYL (DURAGESIC - DOSED MCG/HR) 25 MCG/HR patch [id number] and oxyCODONE (ROXICODONE) 15 MG immediate release tablet [id number]

## 2016-05-07 NOTE — Telephone Encounter (Signed)
OK to refill on 8/11

## 2016-05-09 MED ORDER — OXYCODONE HCL 15 MG PO TABS: 15.0000 mg | ORAL_TABLET | Freq: Three times a day (TID) | ORAL | 0 refills | Status: DC | PRN

## 2016-05-09 MED ORDER — FENTANYL 12 MCG/HR TD PT72: 12.5000 ug | MEDICATED_PATCH | TRANSDERMAL | 0 refills | Status: DC

## 2016-05-09 MED ORDER — FENTANYL 25 MCG/HR TD PT72: 25.0000 ug | MEDICATED_PATCH | TRANSDERMAL | 0 refills | Status: DC

## 2016-05-09 NOTE — Telephone Encounter (Signed)
Refilled fentanyl 25 mg patches.

## 2016-05-09 NOTE — Telephone Encounter (Signed)
Patient informed hardcopy is ready for pickup at the front desk. 

## 2016-05-09 NOTE — Telephone Encounter (Signed)
Pt called in to follow up on med request. Please assist pt further.    Thanks.

## 2016-05-09 NOTE — Addendum Note (Signed)
Addended by: Sharon Seller B on: 05/09/2016 02:53 PM   Modules accepted: Orders

## 2016-05-15 ENCOUNTER — Ambulatory Visit: Payer: Self-pay | Admitting: Family Medicine

## 2016-05-19 ENCOUNTER — Telehealth: Payer: Self-pay | Admitting: Family Medicine

## 2016-05-19 NOTE — Telephone Encounter (Signed)
Great, will wait for appt to change for documentation purposes

## 2016-05-19 NOTE — Telephone Encounter (Signed)
Called the patient and we had canceled her appt. For 05/20/16.  She has rescheduled for 06/06/16. She just wanted to mention to Dr. Charlett Blake that recently she had a fentanyl 25 mg patch left over and used it along with another 25 mg patch.  Together worked really well for her pain, waking in the mornings with little to no pain. She stated that Dr. Charlett Blake had previously discussed trying a 50 mg patch.  She is ok to wait until her appt. In September, but just wanted it documented she has tried the 64 and that it worked well for her.Marland Kitchen

## 2016-05-19 NOTE — Telephone Encounter (Signed)
Relation to [id number] Call back number:[phone]   Reason for call:  Patient 05/20/16 follow up appointment was canceled due to provider patient would like to discuss fentaNYL (DURAGESIC - DOSED MCG/HR) 12 MCG/HR. Please advise

## 2016-05-20 ENCOUNTER — Ambulatory Visit: Payer: Self-pay | Admitting: Family Medicine

## 2016-05-27 ENCOUNTER — Telehealth: Payer: Self-pay | Admitting: Family Medicine

## 2016-05-27 ENCOUNTER — Other Ambulatory Visit: Payer: Self-pay | Admitting: Family Medicine

## 2016-05-27 NOTE — Telephone Encounter (Signed)
Relation to [id number] Call back [phone]   Reason for call:  Patient received a ticket due to handicap sticker being expired patient requesting a new handicap place card and would like a letter that she can bring to court stating its been renewed. Please advise

## 2016-05-27 NOTE — Telephone Encounter (Signed)
Ok to write her a letter saying her handicap sticker has been renewed and please fill out handicap form for orthopaedic illness. permanent

## 2016-05-29 ENCOUNTER — Encounter: Payer: Self-pay | Admitting: Family Medicine

## 2016-05-29 NOTE — Telephone Encounter (Signed)
Form/letter completed. Patient will pickup at our office and she has been informed

## 2016-06-03 ENCOUNTER — Other Ambulatory Visit: Payer: Self-pay | Admitting: Family Medicine

## 2016-06-03 NOTE — Telephone Encounter (Signed)
Requesting:  Oxycodone and fentanyl patches Contract  08/07/14 UDS  Moderate----due Last OV   02/12/16 Last Refill  Patches  #10 on 05/09/16                    Oxycodone   #90 with 0 refills on 05/09/16  Please Advise

## 2016-06-03 NOTE — Telephone Encounter (Signed)
Relation to [id number] Call back number: [phone]   Reason for call:   Patient requesting a refill fentaNYL (DURAGESIC - DOSED MCG/HR) patch (patient states PCP will increase to 50 MG ) please advise.   In addition patient is requesting a refill oxyCODONE (ROXICODONE) 15 MG immediate release tablet

## 2016-06-03 NOTE — Telephone Encounter (Signed)
She is not due until the 10th of this month. PCP will return on the 8th. Will defer to her since these are all controlled medications.

## 2016-06-04 ENCOUNTER — Telehealth: Payer: Self-pay | Admitting: Family Medicine

## 2016-06-04 NOTE — Telephone Encounter (Signed)
FYI-Will forward both messages to PCP to review once she is back in the office.

## 2016-06-04 NOTE — Telephone Encounter (Signed)
Patient informed of denial at this time due to date due not until the 10th.  Patient informed request has been forwarded to PCP and will call her once refills are done/ready for pickup.

## 2016-06-04 NOTE — Telephone Encounter (Signed)
FYI-Patient called back stated that DMV would not accept that form she had because the had tried to write on it in the providers portion. Patient is asking that we complete another one and give it to her at her appointment that is scheduled for 9/8 for follow up. Asked patient to print another form because her form has been sent to out scan department to be scanned in to her chart and that take a while to reflect in her chart. Patient will email form to save a trip to the office  Patient also asked could she get here refills that day since the 10th is on a Sunday? Previous request was denied because it cannot be filled until 10th please advise

## 2016-06-04 NOTE — Telephone Encounter (Signed)
Patient called stating that she was at [facility] to get her Handicap plate and the form Dr. Charlett Blake filled out was not completed. Stated that she needed the providers license number and also the provider did not indicate on the form if it was just temporary or perm. Consulted with the CMA (Robin) and was informed that the patient needed to bring the form back so that Dr. Charlett Blake could complete it. Informed the patient and she became very irate and stated that she know Dr. Charlett Blake did not leave the number off of the form intentionally and she needed to get a temporary plate today. She put the lady at the [facility] on the phone and the lady stated that she could give her a temp plate but she needed to have the providers license number. Patient came back to the phone after being asked to leave out of the [facility] office because she was getting loud and irate. Stated that she had been asked to leave the office until she was ready to get her plate. She was still irate and continued to ask for the license number. After speaking with two other co-workers and being informed that we can give the number because it is public record, I returned to the phone and gave her the license number.

## 2016-06-05 MED ORDER — OXYCODONE HCL 15 MG PO TABS: 15.0000 mg | ORAL_TABLET | Freq: Three times a day (TID) | ORAL | 0 refills | Status: DC | PRN

## 2016-06-05 MED ORDER — FENTANYL 25 MCG/HR TD PT72
25.0000 ug | MEDICATED_PATCH | TRANSDERMAL | 0 refills | Status: DC
Start: 2016-06-05 — End: 2016-06-06

## 2016-06-05 MED ORDER — FENTANYL 12 MCG/HR TD PT72: 12.5000 ug | MEDICATED_PATCH | TRANSDERMAL | 0 refills | Status: DC

## 2016-06-05 NOTE — Telephone Encounter (Signed)
Patient informed to pickup hardcopy's tomorrow 06/06/16 once PCP will be in the office to sign scripts. Patient is actually on our schedule tomorrow at 1:15 to see PCP.  Patient is aware of all.

## 2016-06-05 NOTE — Telephone Encounter (Signed)
Will redo form and she can pick up refill on 9/8r

## 2016-06-05 NOTE — Telephone Encounter (Signed)
Patient has appt. With PCP tomorrow at 1:15 and can do form at that time.

## 2016-06-06 ENCOUNTER — Ambulatory Visit (INDEPENDENT_AMBULATORY_CARE_PROVIDER_SITE_OTHER): Payer: BLUE CROSS/BLUE SHIELD | Admitting: Family Medicine

## 2016-06-06 ENCOUNTER — Ambulatory Visit: Payer: Self-pay | Admitting: Family Medicine

## 2016-06-06 VITALS — BP 122/78 | HR 74 | Temp 97.4°F | Wt 240.2 lb

## 2016-06-06 DIAGNOSIS — E785 Hyperlipidemia, unspecified: Secondary | ICD-10-CM

## 2016-06-06 DIAGNOSIS — I10 Essential (primary) hypertension: Secondary | ICD-10-CM

## 2016-06-06 DIAGNOSIS — E039 Hypothyroidism, unspecified: Secondary | ICD-10-CM | POA: Diagnosis not present

## 2016-06-06 DIAGNOSIS — E559 Vitamin D deficiency, unspecified: Secondary | ICD-10-CM

## 2016-06-06 DIAGNOSIS — R7309 Other abnormal glucose: Secondary | ICD-10-CM

## 2016-06-06 DIAGNOSIS — G4700 Insomnia, unspecified: Secondary | ICD-10-CM

## 2016-06-06 LAB — COMPREHENSIVE METABOLIC PANEL
ALT: 28 U/L (ref 0–35)
AST: 23 U/L (ref 0–37)
Albumin: 4.5 g/dL (ref 3.5–5.2)
Alkaline Phosphatase: 73 U/L (ref 39–117)
BUN: 13 mg/dL (ref 6–23)
CALCIUM: 10.5 mg/dL (ref 8.4–10.5)
CHLORIDE: 101 meq/L (ref 96–112)
CO2: 30 meq/L (ref 19–32)
CREATININE: 0.51 mg/dL (ref 0.40–1.20)
GFR: 133.26 mL/min (ref >60.00)
Glucose, Bld: 102 mg/dL — ABNORMAL HIGH (ref 70–99)
POTASSIUM: 4.1 meq/L (ref 3.5–5.1)
SODIUM: 138 meq/L (ref 135–145)
Total Bilirubin: 0.6 mg/dL (ref 0.2–1.2)
Total Protein: 7.6 g/dL (ref 6.0–8.3)

## 2016-06-06 LAB — CBC
HEMATOCRIT: 43.2 % (ref 36.0–46.0)
Hemoglobin: 14.6 g/dL (ref 12.0–15.0)
MCHC: 33.9 g/dL (ref 30.0–36.0)
MCV: 92.9 fl (ref 78.0–100.0)
Platelets: 160 10*3/uL (ref 150.0–400.0)
RBC: 4.65 Mil/uL (ref 3.87–5.11)
RDW: 14 % (ref 11.5–15.5)
WBC: 8.5 10*3/uL (ref 4.0–10.5)

## 2016-06-06 LAB — TSH: TSH: 2.09 u[IU]/mL (ref 0.35–4.50)

## 2016-06-06 LAB — LIPID PANEL
CHOL/HDL RATIO: 5
CHOLESTEROL: 291 mg/dL — AB (ref 0–200)
HDL: 58.8 mg/dL (ref >39.00)
NonHDL: 231.99
TRIGLYCERIDES: 210 mg/dL — AB (ref 0.0–149.0)
VLDL: 42 mg/dL — ABNORMAL HIGH (ref 0.0–40.0)

## 2016-06-06 LAB — VITAMIN D 25 HYDROXY (VIT D DEFICIENCY, FRACTURES): VITD: 38.15 ng/mL (ref 30.00–100.00)

## 2016-06-06 LAB — LDL CHOLESTEROL, DIRECT: LDL DIRECT: 189 mg/dL

## 2016-06-06 LAB — HEMOGLOBIN A1C: HEMOGLOBIN A1C: 5.6 % (ref 4.6–6.5)

## 2016-06-06 MED ORDER — FENTANYL 50 MCG/HR TD PT72: 50.0000 ug | MEDICATED_PATCH | TRANSDERMAL | 0 refills | Status: DC

## 2016-06-06 MED ORDER — ATORVASTATIN CALCIUM 10 MG PO TABS: ORAL_TABLET | ORAL | 1 refills | Status: DC

## 2016-06-06 MED ORDER — OXYCODONE HCL 15 MG PO TABS: 15.0000 mg | ORAL_TABLET | Freq: Three times a day (TID) | ORAL | 0 refills | Status: DC | PRN

## 2016-06-06 MED ORDER — ALPRAZOLAM 0.5 MG PO TABS: 0.5000 mg | ORAL_TABLET | Freq: Three times a day (TID) | ORAL | 1 refills | Status: DC | PRN

## 2016-06-06 NOTE — Patient Instructions (Signed)
Encouraged good sleep hygiene such as dark, quiet room. No blue/green glowing lights such as computer screens in bedroom. No alcohol or stimulants in evening. Cut down on caffeine as able. Regular exercise is helpful but not just prior to bed time. Melatonin 2 to 10 mg at bedtime  Insomnia Insomnia is a sleep disorder that makes it difficult to fall asleep or to stay asleep. Insomnia can cause tiredness (fatigue), low energy, difficulty concentrating, mood swings, and poor performance at work or school.  There are three different ways to classify insomnia:  Difficulty falling asleep.  Difficulty staying asleep.  Waking up too early in the morning. Any type of insomnia can be long-term (chronic) or short-term (acute). Both are common. Short-term insomnia usually lasts for three months or less. Chronic insomnia occurs at least three times a week for longer than three months. CAUSES  Insomnia may be caused by another condition, situation, or substance, such as:  Anxiety.  Certain medicines.  Gastroesophageal reflux disease (GERD) or other gastrointestinal conditions.  Asthma or other breathing conditions.  Restless legs syndrome, sleep apnea, or other sleep disorders.  Chronic pain.  Menopause. This may include hot flashes.  Stroke.  Abuse of alcohol, tobacco, or illegal drugs.  Depression.  Caffeine.   Neurological disorders, such as Alzheimer disease.  An overactive thyroid (hyperthyroidism). The cause of insomnia may not be known. RISK FACTORS Risk factors for insomnia include:  Gender. Women are more commonly affected than men.  Age. Insomnia is more common as you get older.  Stress. This may involve your professional or personal life.  Income. Insomnia is more common in people with lower income.  Lack of exercise.   Irregular work schedule or night shifts.  Traveling between different time zones. SIGNS AND SYMPTOMS If you have insomnia, trouble falling  asleep or trouble staying asleep is the main symptom. This may lead to other symptoms, such as:  Feeling fatigued.  Feeling nervous about going to sleep.  Not feeling rested in the morning.  Having trouble concentrating.  Feeling irritable, anxious, or depressed. TREATMENT  Treatment for insomnia depends on the cause. If your insomnia is caused by an underlying condition, treatment will focus on addressing the condition. Treatment may also include:   Medicines to help you sleep.  Counseling or therapy.  Lifestyle adjustments. HOME CARE INSTRUCTIONS   Take medicines only as directed by your health care provider.  Keep regular sleeping and waking hours. Avoid naps.  Keep a sleep diary to help you and your health care provider figure out what could be causing your insomnia. Include:   When you sleep.  When you wake up during the night.  How well you sleep.   How rested you feel the next day.  Any side effects of medicines you are taking.  What you eat and drink.   Make your bedroom a comfortable place where it is easy to fall asleep:  Put up shades or special blackout curtains to block light from outside.  Use a white noise machine to block noise.  Keep the temperature cool.   Exercise regularly as directed by your health care provider. Avoid exercising right before bedtime.  Use relaxation techniques to manage stress. Ask your health care provider to suggest some techniques that may work well for you. These may include:  Breathing exercises.  Routines to release muscle tension.  Visualizing peaceful scenes.  Cut back on alcohol, caffeinated beverages, and cigarettes, especially close to bedtime. These can disrupt your sleep.  Do not overeat or eat spicy foods right before bedtime. This can lead to digestive discomfort that can make it hard for you to sleep.  Limit screen use before bedtime. This includes:  Watching TV.  Using your smartphone, tablet,  and computer.  Stick to a routine. This can help you fall asleep faster. Try to do a quiet activity, brush your teeth, and go to bed at the same time each night.  Get out of bed if you are still awake after 15 minutes of trying to sleep. Keep the lights down, but try reading or doing a quiet activity. When you feel sleepy, go back to bed.  Make sure that you drive carefully. Avoid driving if you feel very sleepy.  Keep all follow-up appointments as directed by your health care provider. This is important. SEEK MEDICAL CARE IF:   You are tired throughout the day or have trouble in your daily routine due to sleepiness.  You continue to have sleep problems or your sleep problems get worse. SEEK IMMEDIATE MEDICAL CARE IF:   You have serious thoughts about hurting yourself or someone else.   This information is not intended to replace advice given to you by your health care provider. Make sure you discuss any questions you have with your health care provider.   Document Released: 09/12/2000 Document Revised: 06/06/2015 Document Reviewed: 06/16/2014 Elsevier Interactive Patient Education [employer].

## 2016-06-06 NOTE — Progress Notes (Signed)
Pre visit review using our clinic review tool, if applicable. No additional management support is needed unless otherwise documented below in the visit note. 

## 2016-06-15 NOTE — Assessment & Plan Note (Signed)
Encouraged heart healthy diet, increase exercise, avoid trans fats, consider a krill oil cap daily 

## 2016-06-15 NOTE — Assessment & Plan Note (Signed)
Encouraged good sleep hygiene such as dark, quiet room. No blue/green glowing lights such as computer screens in bedroom. No alcohol or stimulants in evening. Cut down on caffeine as able. Regular exercise is helpful but not just prior to bed time. Encouraged to avoid medicines for sleep if possible

## 2016-06-15 NOTE — Progress Notes (Signed)
Patient ID: [name] [name], female   DOB: [birth year], 55 y.o.   MRN: [id number]   Subjective:    Patient ID: [name] [name], female    DOB: 1961/07/27, 55 y.o.   MRN: [id number]  Chief Complaint  Patient presents with  . Follow-up    HPI Patient is in today for follow up. She continues to struggle with chronic pain, myalgias and arthralgias. No recent injury or acute concerns. Has trouble with loose stool but no bloody or tarry stool. Denies CP/palp/SOB/HA/congestion/fevers or GU c/o. Taking meds as prescribed  Past Medical History:  Diagnosis Date  . Acute upper respiratory infections of unspecified site 06/12/2014  . Anxiety and depression    chronic  . Chronic back pain   . Depression with anxiety 08/23/2008   Qualifier: Diagnosis of  By: Niel Hummer MD, [place] History of fibromyalgia    suspected   . Hypertension   . Insomnia   . Knee pain, right 06/24/2015  . Morbid obesity ([place])   . Ovarian cyst, bilateral   . Preventative health care 06/24/2015  . PTSD (post-traumatic stress disorder)   . Vitamin D deficiency 06/24/2015    Past Surgical History:  Procedure Laterality Date  . OVARIAN CYST REMOVAL      Family History  Problem Relation Age of Onset  . ADD / ADHD Neg Hx   . Depression Neg Hx   . Alcohol abuse Neg Hx   . Drug abuse Neg Hx   . Rheum arthritis Father   . Diabetes Father   . Congestive Heart Failure Father   . Hypertension Father   . Cancer Maternal Uncle 52    colon  . Hip fracture Maternal Grandmother   . COPD Maternal Grandfather 87    colon  . Lymphoma Paternal Grandmother     Social History   Social History  . Marital status: Divorced    Spouse name: N/A  . Number of children: N/A  . Years of education: N/A   Occupational History  . Not on file.   Social History Main Topics  . Smoking status: Former Smoker    Quit date: 04/04/1989  . Smokeless tobacco: Never Used  . Alcohol use Yes     Comment: occassionally- 2 glassses of wine  2x/month  . Drug use: No     Comment: take meds as prescribed  . Sexual activity: Not on file   Other Topics Concern  . Not on file   Social History Narrative  . No narrative on file    Outpatient Medications Prior to Visit  Medication Sig Dispense Refill  . beclomethasone (BECONASE-AQ) 42 MCG/SPRAY nasal spray Place 1 spray into both nostrils 2 (two) times daily. Dose is for each nostril. 25 g 1  . lisinopril (PRINIVIL,ZESTRIL) 10 MG tablet TAKE 1 TABLET (10 MG TOTAL) BY MOUTH DAILY. 30 tablet 5  . metoprolol succinate (TOPROL-XL) 25 MG 24 hr tablet TAKE 1 TABLET (25 MG TOTAL) BY MOUTH DAILY. 30 tablet 5  . sertraline (ZOLOFT) 100 MG tablet TAKE 1 TABLET (100 MG TOTAL) BY MOUTH DAILY. 90 tablet 1  . triamterene-hydrochlorothiazide (MAXZIDE-25) 37.5-25 MG tablet TAKE 1 TABLET BY MOUTH DAILY. 30 tablet 5  . Vitamin D, Ergocalciferol, (DRISDOL) 50000 units CAPS capsule TAKE 1 CAPSULE (50,000 UNITS TOTAL) BY MOUTH EVERY 7 (SEVEN) DAYS. 4 capsule 3  . zolpidem (AMBIEN) 10 MG tablet Take 1 tablet (10 mg total) by mouth at bedtime as needed for sleep. [place]  tablet 2  . ALPRAZolam (XANAX) 1 MG tablet Take 1 tablet (1 mg total) by mouth 3 (three) times daily. 90 tablet 2  . amphetamine-dextroamphetamine (ADDERALL) 20 MG tablet Take 1 tablet (20 mg total) by mouth 2 (two) times daily. 60 tablet 0  . cholestyramine (QUESTRAN) 4 g packet Take 1 packet (4 g total) by mouth 2 (two) times daily. 60 each 3  . fentaNYL (DURAGESIC - DOSED MCG/HR) 12 MCG/HR Place 1 patch (12.5 mcg total) onto the skin every 3 (three) days. 10 patch 0  . fentaNYL (DURAGESIC - DOSED MCG/HR) 25 MCG/HR patch Place 1 patch (25 mcg total) onto the skin every 3 (three) days. 10 patch 0  . oxyCODONE (ROXICODONE) 15 MG immediate release tablet Take 1 tablet (15 mg total) by mouth 3 (three) times daily as needed for pain. 90 tablet 0   No facility-administered medications prior to visit.     Allergies  Allergen Reactions  .  Cymbalta [Duloxetine Hcl] Other (See Comments)    Headaches  . Fiorinal [Butalbital-Aspirin-Caffeine] Other (See Comments)    Mental Clarity.    Review of Systems  Constitutional: Positive for malaise/fatigue. Negative for fever.  HENT: Negative for congestion.   Eyes: Negative for blurred vision.  Respiratory: Negative for shortness of breath.   Cardiovascular: Negative for chest pain, palpitations and leg swelling.  Gastrointestinal: Negative for abdominal pain, blood in stool and nausea.  Genitourinary: Negative for dysuria and frequency.  Musculoskeletal: Positive for back pain and myalgias. Negative for falls.  Skin: Negative for rash.  Neurological: Negative for dizziness, loss of consciousness and headaches.  Endo/Heme/Allergies: Negative for environmental allergies.  Psychiatric/Behavioral: Negative for depression. The patient is nervous/anxious and has insomnia.        Objective:    Physical Exam  Constitutional: She is oriented to person, place, and time. She appears well-developed and well-nourished. No distress.  HENT:  Head: Normocephalic and atraumatic.  Nose: Nose normal.  Eyes: Right eye exhibits no discharge. Left eye exhibits no discharge.  Neck: Normal range of motion. Neck supple.  Cardiovascular: Normal rate and regular rhythm.   No murmur heard. Pulmonary/Chest: Effort normal and breath sounds normal.  Abdominal: Soft. Bowel sounds are normal. There is no tenderness.  Musculoskeletal: She exhibits no edema.  Neurological: She is alert and oriented to person, place, and time.  Skin: Skin is warm and dry.  Psychiatric: She has a normal mood and affect.  Nursing note and vitals reviewed.   BP 122/78 (BP Location: Left Arm, Patient Position: Sitting, Cuff Size: Normal)   Pulse 74   Temp 97.4 F (36.3 C) (Oral)   Wt 240 lb 3.2 oz (109 kg)   SpO2 96%   BMI 39.97 kg/m  Wt Readings from Last 3 Encounters:  06/06/16 240 lb 3.2 oz (109 kg)  02/12/16 240  lb 6 oz (109 kg)  11/13/15 241 lb 4 oz (109.4 kg)     Lab Results  Component Value Date   WBC 8.5 06/06/2016   HGB 14.6 06/06/2016   HCT 43.2 06/06/2016   PLT 160.0 06/06/2016   GLUCOSE 102 (H) 06/06/2016   CHOL 291 (H) 06/06/2016   TRIG 210.0 (H) 06/06/2016   HDL 58.80 06/06/2016   LDLDIRECT 189.0 06/06/2016   LDLCALC 84 03/06/2014   ALT 28 06/06/2016   AST 23 06/06/2016   NA 138 06/06/2016   K 4.1 06/06/2016   CL 101 06/06/2016   CREATININE 0.51 06/06/2016   BUN 13 06/06/2016  CO2 30 06/06/2016   TSH 2.09 06/06/2016   HGBA1C 5.6 06/06/2016    Lab Results  Component Value Date   TSH 2.09 06/06/2016   Lab Results  Component Value Date   WBC 8.5 06/06/2016   HGB 14.6 06/06/2016   HCT 43.2 06/06/2016   MCV 92.9 06/06/2016   PLT 160.0 06/06/2016   Lab Results  Component Value Date   NA 138 06/06/2016   K 4.1 06/06/2016   CO2 30 06/06/2016   GLUCOSE 102 (H) 06/06/2016   BUN 13 06/06/2016   CREATININE 0.51 06/06/2016   BILITOT 0.6 06/06/2016   ALKPHOS 73 06/06/2016   AST 23 06/06/2016   ALT 28 06/06/2016   PROT 7.6 06/06/2016   ALBUMIN 4.5 06/06/2016   CALCIUM 10.5 06/06/2016   GFR 133.26 06/06/2016   Lab Results  Component Value Date   CHOL 291 (H) 06/06/2016   Lab Results  Component Value Date   HDL 58.80 06/06/2016   Lab Results  Component Value Date   LDLCALC 84 03/06/2014   Lab Results  Component Value Date   TRIG 210.0 (H) 06/06/2016   Lab Results  Component Value Date   CHOLHDL 5 06/06/2016   Lab Results  Component Value Date   HGBA1C 5.6 06/06/2016       Assessment & Plan:   Problem List Items Addressed This Visit    Hypothyroidism   Relevant Orders   TSH (Completed)   Hyperlipidemia    Encouraged heart healthy diet, increase exercise, avoid trans fats, consider a krill oil cap daily      Relevant Medications   atorvastatin (LIPITOR) 10 MG tablet   Other Relevant Orders   Lipid panel (Completed)   MORBID OBESITY     Encouraged DASH diet, decrease po intake and increase exercise as tolerated. Needs 7-8 hours of sleep nightly. Avoid trans fats, eat small, frequent meals every 4-5 hours with lean proteins, complex carbs and healthy fats. Minimize simple carbs, GMO foods.      Essential hypertension   Relevant Medications   atorvastatin (LIPITOR) 10 MG tablet   Other Relevant Orders   TSH (Completed)   CBC (Completed)   Comprehensive metabolic panel (Completed)   Insomnia    Encouraged good sleep hygiene such as dark, quiet room. No blue/green glowing lights such as computer screens in bedroom. No alcohol or stimulants in evening. Cut down on caffeine as able. Regular exercise is helpful but not just prior to bed time. Encouraged to avoid medicines for sleep if possible      HYPERGLYCEMIA    minimize simple carbs. Increase exercise as tolerated.      Relevant Orders   Hemoglobin A1c (Completed)   Vitamin D deficiency - Primary   Relevant Orders   Vitamin D (25 hydroxy) (Completed)    Other Visit Diagnoses   None.     I have discontinued Ms. Benoist's amphetamine-dextroamphetamine, ALPRAZolam, cholestyramine, fentaNYL, and fentaNYL. I am also having her start on ALPRAZolam, fentaNYL, and atorvastatin. Additionally, I am having her maintain her beclomethasone, zolpidem, Vitamin D (Ergocalciferol), triamterene-hydrochlorothiazide, sertraline, lisinopril, metoprolol succinate, and oxyCODONE.  Meds ordered this encounter  Medications  . ALPRAZolam (XANAX) 0.5 MG tablet    Sig: Take 1 tablet (0.5 mg total) by mouth 3 (three) times daily as needed for anxiety.    Dispense:  90 tablet    Refill:  1  . oxyCODONE (ROXICODONE) 15 MG immediate release tablet    Sig: Take 1 tablet (15 mg total)  by mouth 3 (three) times daily as needed for pain.    Dispense:  70 tablet    Refill:  0  . fentaNYL (DURAGESIC) 50 MCG/HR    Sig: Place 1 patch (50 mcg total) onto the skin every 3 (three) days.    Dispense:  10  patch    Refill:  0  . atorvastatin (LIPITOR) 10 MG tablet    Sig: 1 tab po twice weekly at bedtime    Dispense:  30 tablet    Refill:  1     Penni Homans, MD

## 2016-06-15 NOTE — Assessment & Plan Note (Signed)
Encouraged moist heat and gentle stretching as tolerated. May try NSAIDs and prescription meds as directed and report if symptoms worsen or seek immediate care. May use pain meds prn but encouraged to minimize use and stay as active as possibl

## 2016-06-15 NOTE — Assessment & Plan Note (Signed)
minimize simple carbs. Increase exercise as tolerated.  

## 2016-06-15 NOTE — Assessment & Plan Note (Signed)
Encouraged DASH diet, decrease po intake and increase exercise as tolerated. Needs 7-8 hours of sleep nightly. Avoid trans fats, eat small, frequent meals every 4-5 hours with lean proteins, complex carbs and healthy fats. Minimize simple carbs, GMO foods. 

## 2016-06-16 ENCOUNTER — Ambulatory Visit: Payer: Self-pay | Admitting: Family Medicine

## 2016-06-28 ENCOUNTER — Other Ambulatory Visit: Payer: Self-pay | Admitting: Family Medicine

## 2016-07-01 ENCOUNTER — Other Ambulatory Visit: Payer: Self-pay | Admitting: Family Medicine

## 2016-07-01 MED ORDER — FENTANYL 50 MCG/HR TD PT72: 50.0000 ug | MEDICATED_PATCH | TRANSDERMAL | 0 refills | Status: DC

## 2016-07-01 MED ORDER — ALPRAZOLAM 0.5 MG PO TABS
0.5000 mg | ORAL_TABLET | Freq: Three times a day (TID) | ORAL | 1 refills | Status: DC | PRN
Start: 2016-07-01 — End: 2016-07-02

## 2016-07-01 MED ORDER — OXYCODONE HCL 15 MG PO TABS: 15.0000 mg | ORAL_TABLET | Freq: Three times a day (TID) | ORAL | 0 refills | Status: DC | PRN

## 2016-07-01 NOTE — Telephone Encounter (Signed)
Relation to [id number] Call back number:[phone]   Reason for call:  Patient requesting a refill ALPRAZolam (XANAX) 1 MG tablet (requesting 0.5 to increase to 1 MG due to father passing) , fentaNYL (DURAGESIC) 50 MCG/HR, oxyCODONE (ROXICODONE) 15 MG immediate release tablet

## 2016-07-01 NOTE — Telephone Encounter (Signed)
Requesting: alprazolam, oxycodone and fentanyl Contract  08/07/2014 UDS  Moderate---OVERDUE Last OV   06/06/2016 Last Refill   Alprazolam  #90 with 1 refill on 06/06/16 (SHOULD HAVE A REFILL LEFT)                    Fentanyl  #10 with 0 refills 06/06/2016                     Oxycodone  #70 with 0 refills on 06/06/2016  Please Advise

## 2016-07-02 ENCOUNTER — Telehealth: Payer: Self-pay

## 2016-07-02 MED ORDER — OXYCODONE HCL 15 MG PO TABS: 15.0000 mg | ORAL_TABLET | Freq: Three times a day (TID) | ORAL | 0 refills | Status: DC | PRN

## 2016-07-02 MED ORDER — FENTANYL 50 MCG/HR TD PT72: 50.0000 ug | MEDICATED_PATCH | TRANSDERMAL | 0 refills | Status: DC

## 2016-07-02 MED ORDER — ALPRAZOLAM 0.5 MG PO TABS: 0.5000 mg | ORAL_TABLET | Freq: Three times a day (TID) | ORAL | 1 refills | Status: DC | PRN

## 2016-07-02 NOTE — Telephone Encounter (Signed)
error 

## 2016-07-02 NOTE — Addendum Note (Signed)
Addended by: Magdalene Molly A on: 07/02/2016 08:33 AM   Modules accepted: Orders

## 2016-07-02 NOTE — Telephone Encounter (Signed)
Rx are ready for pick up pt notified. Rx will be in the front office. Pt states she will send [name] [name] to pick up rx PC

## 2016-07-02 NOTE — Telephone Encounter (Signed)
Medications already printed and ready to be signed by provider. Will be ready for pick-up on Thursday. Medications have been printed off by accident twice, once set has been voided out. (no not was attached specifying other wise) PC

## 2016-07-28 ENCOUNTER — Other Ambulatory Visit: Payer: Self-pay | Admitting: Family Medicine

## 2016-07-28 MED ORDER — FENTANYL 50 MCG/HR TD PT72: 50.0000 ug | MEDICATED_PATCH | TRANSDERMAL | 0 refills | Status: DC

## 2016-07-28 MED ORDER — OXYCODONE HCL 15 MG PO TABS: 15.0000 mg | ORAL_TABLET | Freq: Three times a day (TID) | ORAL | 0 refills | Status: DC | PRN

## 2016-07-28 NOTE — Telephone Encounter (Signed)
Needs new UDS and contract, rx printed

## 2016-07-28 NOTE — Telephone Encounter (Signed)
Relation to [id number] Call back number: [phone]    Reason for call:  Patient requesting a refill oxyCODONE (ROXICODONE) 15 MG immediate release tablet and fentaNYL (DURAGESIC) 50 MCG/HR

## 2016-07-28 NOTE — Telephone Encounter (Signed)
Requesting:  Oxycodone and fentanyl Contract   08/07/2014 UDS  Moderate--due Last OV   06/06/2016 Last Refill  Oxy.  #70  07/02/2016                    Fentanyl   #10 07/02/2016  Please Advise

## 2016-07-29 ENCOUNTER — Encounter: Payer: Self-pay | Admitting: Family Medicine

## 2016-07-29 NOTE — Telephone Encounter (Signed)
Called the patient informed to update contract/UDS before getting hardcopy's Patient agreed to do/understood instructions.

## 2016-08-06 ENCOUNTER — Telehealth: Payer: Self-pay | Admitting: Family Medicine

## 2016-08-06 NOTE — Telephone Encounter (Signed)
Relation to pt: self Call back [phone]   Reason for call:  Patient requesting 05/15/2016 No Show fee waived patient states she spoke with Shirlean Mylar explained to her a lot was going on and stated PCP would waive, chart doesn't reflect. Please advise

## 2016-08-06 NOTE — Telephone Encounter (Signed)
No charge. 

## 2016-08-25 ENCOUNTER — Telehealth: Payer: Self-pay | Admitting: Family Medicine

## 2016-08-25 NOTE — Telephone Encounter (Signed)
Requesting:  Oxycodone and fentanyl Contract  08/07/2014 UDS  moderate Last OV  06/06/2016 Last Refill Oxy--#70 07/28/2016                 Fentanyl  #10    07/28/2016 Please Advise

## 2016-08-25 NOTE — Telephone Encounter (Signed)
Can have refill at end of week

## 2016-08-25 NOTE — Telephone Encounter (Signed)
Caller name: Relationship to patient: Self Can be reached: [phone]  Pharmacy:  Reason for call: Request refill on fentaNYL (DURAGESIC) 50 MCG/HR [id number]  oxyCODONE (ROXICODONE) 15 MG immediate release tablet [id number]

## 2016-08-26 NOTE — Telephone Encounter (Signed)
Called left message to call back 

## 2016-08-26 NOTE — Telephone Encounter (Signed)
Spoke to the patient informed can pickup hardcopy's on Friday as too early to refill today. The patient verbally understood/knew was early just wanted to get request in early. Ok to call both cell/home number on Friday (leave a detailed message if no answer when ready) Cell [phone] and home number [phone].

## 2016-08-29 MED ORDER — OXYCODONE HCL 15 MG PO TABS: 15.0000 mg | ORAL_TABLET | Freq: Three times a day (TID) | ORAL | 0 refills | Status: DC | PRN

## 2016-08-29 MED ORDER — FENTANYL 50 MCG/HR TD PT72: 50.0000 ug | MEDICATED_PATCH | TRANSDERMAL | 0 refills | Status: DC

## 2016-08-29 NOTE — Telephone Encounter (Signed)
Patient called regarding this medication. I informed patient that her medication is up front and ready to be picked up.

## 2016-08-29 NOTE — Telephone Encounter (Signed)
Printed today 08/29/2016 , PCP signed and will put at the front desk for the patient to pickup at her convenience.

## 2016-08-29 NOTE — Addendum Note (Signed)
Addended by: Sharon Seller B on: 08/29/2016 09:03 AM   Modules accepted: Orders

## 2016-09-02 ENCOUNTER — Other Ambulatory Visit: Payer: Self-pay | Admitting: *Deleted

## 2016-09-02 ENCOUNTER — Other Ambulatory Visit: Payer: Self-pay | Admitting: Family Medicine

## 2016-09-02 MED ORDER — ATORVASTATIN CALCIUM 10 MG PO TABS: ORAL_TABLET | ORAL | 1 refills | Status: DC

## 2016-09-05 ENCOUNTER — Ambulatory Visit (INDEPENDENT_AMBULATORY_CARE_PROVIDER_SITE_OTHER): Payer: BLUE CROSS/BLUE SHIELD | Admitting: Family Medicine

## 2016-09-05 ENCOUNTER — Encounter: Payer: Self-pay | Admitting: Family Medicine

## 2016-09-05 VITALS — BP 120/68 | HR 78 | Temp 97.6°F | Wt 237.2 lb

## 2016-09-05 DIAGNOSIS — I10 Essential (primary) hypertension: Secondary | ICD-10-CM

## 2016-09-05 DIAGNOSIS — E039 Hypothyroidism, unspecified: Secondary | ICD-10-CM | POA: Diagnosis not present

## 2016-09-05 DIAGNOSIS — E559 Vitamin D deficiency, unspecified: Secondary | ICD-10-CM

## 2016-09-05 DIAGNOSIS — E782 Mixed hyperlipidemia: Secondary | ICD-10-CM

## 2016-09-05 DIAGNOSIS — J00 Acute nasopharyngitis [common cold]: Secondary | ICD-10-CM

## 2016-09-05 DIAGNOSIS — F418 Other specified anxiety disorders: Secondary | ICD-10-CM

## 2016-09-05 DIAGNOSIS — R7309 Other abnormal glucose: Secondary | ICD-10-CM | POA: Diagnosis not present

## 2016-09-05 DIAGNOSIS — Z1231 Encounter for screening mammogram for malignant neoplasm of breast: Secondary | ICD-10-CM

## 2016-09-05 DIAGNOSIS — Z1239 Encounter for other screening for malignant neoplasm of breast: Secondary | ICD-10-CM

## 2016-09-05 DIAGNOSIS — M549 Dorsalgia, unspecified: Secondary | ICD-10-CM

## 2016-09-05 DIAGNOSIS — Z0000 Encounter for general adult medical examination without abnormal findings: Secondary | ICD-10-CM

## 2016-09-05 DIAGNOSIS — Z124 Encounter for screening for malignant neoplasm of cervix: Secondary | ICD-10-CM

## 2016-09-05 HISTORY — DX: Encounter for other screening for malignant neoplasm of breast: Z12.39

## 2016-09-05 HISTORY — DX: Encounter for screening for malignant neoplasm of cervix: Z12.4

## 2016-09-05 LAB — COMPREHENSIVE METABOLIC PANEL
ALK PHOS: 69 U/L (ref 33–130)
ALT: 39 U/L — AB (ref 6–29)
AST: 27 U/L (ref 10–35)
Albumin: 4.4 g/dL (ref 3.6–5.1)
BILIRUBIN TOTAL: 0.7 mg/dL (ref 0.2–1.2)
BUN: 14 mg/dL (ref 7–25)
CO2: 25 mmol/L (ref 20–31)
CREATININE: 0.56 mg/dL (ref 0.50–1.05)
Calcium: 10.3 mg/dL (ref 8.6–10.4)
Chloride: 103 mmol/L (ref 98–110)
GLUCOSE: 107 mg/dL — AB (ref 65–99)
Potassium: 4.8 mmol/L (ref 3.5–5.3)
SODIUM: 140 mmol/L (ref 135–146)
Total Protein: 7 g/dL (ref 6.1–8.1)

## 2016-09-05 LAB — CBC
HCT: 45 % (ref 35.0–45.0)
Hemoglobin: 14.8 g/dL (ref 11.7–15.5)
MCH: 30.9 pg (ref 27.0–33.0)
MCHC: 32.9 g/dL (ref 32.0–36.0)
MCV: 93.9 fL (ref 80.0–100.0)
MPV: 10 fL (ref 7.5–12.5)
PLATELETS: 169 10*3/uL (ref 140–400)
RBC: 4.79 MIL/uL (ref 3.80–5.10)
RDW: 14 % (ref 11.0–15.0)
WBC: 8.1 10*3/uL (ref 3.8–10.8)

## 2016-09-05 LAB — LIPID PANEL
Cholesterol: 200 mg/dL — ABNORMAL HIGH (ref <200)
HDL: 71 mg/dL (ref >50)
LDL CALC: 110 mg/dL — AB (ref <100)
TRIGLYCERIDES: 96 mg/dL (ref <150)
Total CHOL/HDL Ratio: 2.8 Ratio (ref <5.0)
VLDL: 19 mg/dL (ref <30)

## 2016-09-05 LAB — TSH: TSH: 0.79 mIU/L

## 2016-09-05 NOTE — Assessment & Plan Note (Signed)
Struggles with constant pain, may continue pain meds as prescribed for now encouraged to cut down.

## 2016-09-05 NOTE — Assessment & Plan Note (Signed)
Well controlled, no changes to meds. Encouraged heart healthy diet such as the DASH diet and exercise as tolerated.  °

## 2016-09-05 NOTE — Progress Notes (Signed)
Pre visit review using our clinic review tool, if applicable. No additional management support is needed unless otherwise documented below in the visit note. 

## 2016-09-05 NOTE — Patient Instructions (Signed)
Encouraged increased rest and hydration, add probiotics, zinc such as Coldeze or Xicam. Treat fevers as needed, vit c 500 to 1000 mg, aged or black garlic, elderberry and Mucinex twice daily Cholesterol Cholesterol is a white, waxy, fat-like substance that is needed by the human body in small amounts. The liver makes all the cholesterol we need. Cholesterol is carried from the liver by the blood through the blood vessels. Deposits of cholesterol (plaques) may build up on blood vessel (artery) walls. Plaques make the arteries narrower and stiffer. Cholesterol plaques increase the risk for heart attack and stroke. You cannot feel your cholesterol level even if it is very high. The only way to know that it is high is to have a blood test. Once you know your cholesterol levels, you should keep a record of the test results. Work with your health care provider to keep your levels in the desired range. What do the results mean?  Total cholesterol is a rough measure of all the cholesterol in your blood.  LDL (low-density lipoprotein) is the "bad" cholesterol. This is the type that causes plaque to build up on the artery walls. You want this level to be low.  HDL (high-density lipoprotein) is the "good" cholesterol because it cleans the arteries and carries the LDL away. You want this level to be high.  Triglycerides are fat that the body can either burn for energy or store. High levels are closely linked to heart disease. What are the desired levels of cholesterol?  Total cholesterol below 200.  LDL below 100 for people who are at risk, below 70 for people at very high risk.  HDL above 40 is good. A level of 60 or higher is considered to be protective against heart disease.  Triglycerides below 150. How can I lower my cholesterol? Diet  Follow your diet program as told by your health care provider.  Choose fish or white meat chicken and [place], roasted or baked. Limit fatty cuts of red meat, fried  foods, and processed meats, such as sausage and lunch meats.  Eat lots of fresh fruits and vegetables.  Choose whole grains, beans, pasta, potatoes, and cereals.  Choose olive oil, corn oil, or canola oil, and use only small amounts.  Avoid butter, mayonnaise, shortening, or palm kernel oils.  Avoid foods with trans fats.  Drink skim or nonfat milk and eat low-fat or nonfat yogurt and cheeses. Avoid whole milk, cream, ice cream, egg yolks, and full-fat cheeses.  Healthier desserts include angel food cake, ginger snaps, animal crackers, hard candy, popsicles, and low-fat or nonfat frozen yogurt. Avoid pastries, cakes, pies, and cookies. Exercise  Follow your exercise program as told by your health care provider. A regular program:  Helps to decrease LDL and raise HDL.  Helps with weight control.  Do things that increase your activity level, such as gardening, walking, and taking the stairs.  Ask your health care provider about ways that you can be more active in your daily life. Medicine  Take over-the-counter and prescription medicines only as told by your health care provider.  Medicine may be prescribed by your health care provider to help lower cholesterol and decrease the risk for heart disease. This is usually done if diet and exercise have failed to bring down cholesterol levels.  If you have several risk factors, you may need medicine even if your levels are normal. This information is not intended to replace advice given to you by your health care provider. Make sure  you discuss any questions you have with your health care provider. Document Released: 06/10/2001 Document Revised: 04/12/2016 Document Reviewed: 03/15/2016 Elsevier Interactive Patient Education  2017 [employer].

## 2016-09-05 NOTE — Assessment & Plan Note (Signed)
hgba1c acceptable, minimize simple carbs. Increase exercise as tolerated. Continue current meds 

## 2016-09-05 NOTE — Assessment & Plan Note (Signed)
On Levothyroxine, continue to monitor 

## 2016-09-05 NOTE — Assessment & Plan Note (Signed)
Taking daily supplements intermittently and weekly dose.

## 2016-09-05 NOTE — Assessment & Plan Note (Signed)
MGM ordered 

## 2016-09-06 LAB — HEMOGLOBIN A1C
Hgb A1c MFr Bld: 5.3 % (ref <5.7)
MEAN PLASMA GLUCOSE: 105 mg/dL

## 2016-09-06 LAB — VITAMIN D 25 HYDROXY (VIT D DEFICIENCY, FRACTURES): VIT D 25 HYDROXY: 46 ng/mL (ref 30–100)

## 2016-09-09 ENCOUNTER — Telehealth: Payer: Self-pay | Admitting: Family Medicine

## 2016-09-09 NOTE — Telephone Encounter (Signed)
Caller name: Relationship to patient: Self Can be reached: [phone]  Pharmacy:  [id number] - [place], [address] AT [address] [phone] ([phone] (Fax)     Reason for call: Patient request that her Rx for ALPRAZolam Duanne Moron) 0.5 MG tablet [id number] Be changed to 1 mg/45 tablets

## 2016-09-09 NOTE — Telephone Encounter (Signed)
This request is for a refill but per patient note is also requesting to increase the strength to 0.5 mg to 1 mg. See note. Last refill  07/04/2016  #90 with 1 refill Last office visit 09/05/2016 Contract done on 08/07/2014 UDS--Up to date--moderate--next due in January 2018

## 2016-09-10 NOTE — Telephone Encounter (Signed)
No she is on too many different meds we discussed at visit. If she wants a referral to psychiatry to discuss with them I am willing to do that. Otherwise she can have refill when do for previous amount.

## 2016-09-11 NOTE — Telephone Encounter (Signed)
Patient informed of PCP instructions.  She verbally understood.

## 2016-09-19 ENCOUNTER — Telehealth: Payer: Self-pay | Admitting: Family Medicine

## 2016-09-19 NOTE — Telephone Encounter (Signed)
Patient is requesting a refill of   fentaNYL (DURAGESIC) 50 MCG/HR and oxyCODONE (ROXICODONE) 15 MG immediate release tablet Please advise   Phone: [phone]

## 2016-09-24 NOTE — Telephone Encounter (Signed)
Can refill both on 12/29

## 2016-09-24 NOTE — Telephone Encounter (Signed)
Pt is requesting refill on Oxycodone and Fentanyl.  Last OV: 09/05/2016 Last Fill on Oxycodone: 08/29/2016 #70 and 0RF (Pt sig: 1 tab TID PRN) Last Fill on Fentanyl: 08/29/2016 #10 patch and 0RF (Pt sig: 1 patch every 3 days)   Please advise.

## 2016-09-25 ENCOUNTER — Telehealth: Payer: Self-pay | Admitting: Family Medicine

## 2016-09-25 NOTE — Telephone Encounter (Signed)
Patient informed hardcopy's of both will be ready on Friday 09/26/2016

## 2016-09-25 NOTE — Telephone Encounter (Signed)
Patient informed hardcopy of both will be ready on Friday 09/26/2016

## 2016-09-25 NOTE — Telephone Encounter (Signed)
Caller name: Relationship to patient: Self Can be reached: [phone]  Pharmacy:  Reason for call: Request refill on oxyCODONE (ROXICODONE) 15 MG immediate release tablet [id number] fentaNYL (DURAGESIC) 50 MCG/HR

## 2016-09-26 MED ORDER — OXYCODONE HCL 15 MG PO TABS: 15.0000 mg | ORAL_TABLET | Freq: Three times a day (TID) | ORAL | 0 refills | Status: DC | PRN

## 2016-09-26 MED ORDER — FENTANYL 50 MCG/HR TD PT72: 50.0000 ug | MEDICATED_PATCH | TRANSDERMAL | 0 refills | Status: DC

## 2016-09-26 NOTE — Addendum Note (Signed)
Addended by: Sharon Seller B on: 09/26/2016 07:27 AM   Modules accepted: Orders

## 2016-09-26 NOTE — Telephone Encounter (Signed)
Printed both/PCP signed/put at the front desk for the patient to pickup.

## 2016-09-28 NOTE — Progress Notes (Signed)
Patient ID: [name] [name], female   DOB: 1961-08-12, 55 y.o.   MRN: [id number]   Subjective:    Patient ID: [name] [name], female    DOB: 05-02-1961, 55 y.o.   MRN: [id number]  Chief Complaint  Patient presents with  . Follow-up    Discuss Alprazolam Rx.  . Nasal Congestion    x 5days. Clear to yellow mucus.    HPI Patient is in today for follow up. She is noting some recent nasal congestion, cough and rhinorrhea but no fevers or malaise. Believes it is improving. Endorses persistent pain, myalgias, joint pain and back pain. No other acute concerns or recent hospitalizations. Denies CP/palp/SOB/HA/congestion/fevers/GI or GU c/o. Taking meds as prescribed  Past Medical History:  Diagnosis Date  . Acute upper respiratory infections of unspecified site 06/12/2014  . Anxiety and depression    chronic  . Breast cancer screening 09/05/2016  . Cervical cancer screening 09/05/2016  . Chronic back pain   . Depression with anxiety 08/23/2008   Qualifier: Diagnosis of  By: Niel Hummer MD, [place] History of fibromyalgia    suspected   . Hypertension   . Insomnia   . Knee pain, right 06/24/2015  . Morbid obesity ([place])   . Ovarian cyst, bilateral   . Preventative health care 06/24/2015  . PTSD (post-traumatic stress disorder)   . Vitamin D deficiency 06/24/2015    Past Surgical History:  Procedure Laterality Date  . OVARIAN CYST REMOVAL      Family History  Problem Relation Age of Onset  . Rheum arthritis Father   . Diabetes Father   . Congestive Heart Failure Father   . Hypertension Father   . Stroke Father   . Cancer Maternal Uncle 21    colon  . Hip fracture Maternal Grandmother   . COPD Maternal Grandfather 87    colon  . Lymphoma Paternal Grandmother   . ADD / ADHD Neg Hx   . Depression Neg Hx   . Alcohol abuse Neg Hx   . Drug abuse Neg Hx     Social History   Social History  . Marital status: Divorced    Spouse name: N/A  . Number of children: N/A  . Years of  education: N/A   Occupational History  . Not on file.   Social History Main Topics  . Smoking status: Former Smoker    Quit date: 04/04/1989  . Smokeless tobacco: Never Used  . Alcohol use Yes     Comment: occassionally- 2 glassses of wine 2x/month  . Drug use: No     Comment: take meds as prescribed  . Sexual activity: Not on file   Other Topics Concern  . Not on file   Social History Narrative  . No narrative on file    Outpatient Medications Prior to Visit  Medication Sig Dispense Refill  . ALPRAZolam (XANAX) 0.5 MG tablet Take 1 tablet (0.5 mg total) by mouth 3 (three) times daily as needed for anxiety. 90 tablet 1  . atorvastatin (LIPITOR) 10 MG tablet TAKE 1 TABLET BY MOUTH TWICE WEEKLY AT BEDTIME 8 tablet 1  . beclomethasone (BECONASE-AQ) 42 MCG/SPRAY nasal spray Place 1 spray into both nostrils 2 (two) times daily. Dose is for each nostril. 25 g 1  . lisinopril (PRINIVIL,ZESTRIL) 10 MG tablet TAKE 1 TABLET (10 MG TOTAL) BY MOUTH DAILY. 30 tablet 5  . metoprolol succinate (TOPROL-XL) 25 MG 24 hr tablet TAKE 1 TABLET (25 MG  TOTAL) BY MOUTH DAILY. 30 tablet 5  . sertraline (ZOLOFT) 100 MG tablet TAKE 1 TABLET (100 MG TOTAL) BY MOUTH DAILY. 90 tablet 1  . triamterene-hydrochlorothiazide (MAXZIDE-25) 37.5-25 MG tablet TAKE 1 TABLET BY MOUTH DAILY. 30 tablet 5  . Vitamin D, Ergocalciferol, (DRISDOL) 50000 units CAPS capsule TAKE 1 CAPSULE (50,000 UNITS TOTAL) BY MOUTH EVERY 7 (SEVEN) DAYS. 4 capsule 3  . zolpidem (AMBIEN) 10 MG tablet Take 1 tablet (10 mg total) by mouth at bedtime as needed for sleep. 30 tablet 2  . fentaNYL (DURAGESIC) 50 MCG/HR Place 1 patch (50 mcg total) onto the skin every 3 (three) days. 10 patch 0  . oxyCODONE (ROXICODONE) 15 MG immediate release tablet Take 1 tablet (15 mg total) by mouth 3 (three) times daily as needed for pain. 70 tablet 0   No facility-administered medications prior to visit.     Allergies  Allergen Reactions  . Cymbalta  [Duloxetine Hcl] Other (See Comments)    Headaches  . Fiorinal [Butalbital-Aspirin-Caffeine] Other (See Comments)    Mental Clarity.    Review of Systems  Constitutional: Positive for malaise/fatigue. Negative for fever.  HENT: Positive for congestion.   Eyes: Negative for blurred vision.  Respiratory: Positive for cough and sputum production. Negative for shortness of breath.   Cardiovascular: Negative for chest pain, palpitations and leg swelling.  Gastrointestinal: Negative for abdominal pain, blood in stool and nausea.  Genitourinary: Negative for dysuria and frequency.  Musculoskeletal: Positive for back pain, joint pain and myalgias. Negative for falls.  Skin: Negative for rash.  Neurological: Negative for dizziness, loss of consciousness and headaches.  Endo/Heme/Allergies: Negative for environmental allergies.  Psychiatric/Behavioral: Negative for depression. The patient is not nervous/anxious.        Objective:    Physical Exam  Constitutional: She is oriented to person, place, and time. She appears well-developed and well-nourished. No distress.  HENT:  Head: Normocephalic and atraumatic.  Nose: Nose normal.  Nasal mucosa boggy and erythematous  Eyes: Right eye exhibits no discharge. Left eye exhibits no discharge.  Neck: Normal range of motion. Neck supple.  Cardiovascular: Normal rate and regular rhythm.   No murmur heard. Pulmonary/Chest: Effort normal and breath sounds normal.  Abdominal: Soft. Bowel sounds are normal. There is no tenderness.  Genitourinary: Rectal exam shows guaiac negative stool.  Musculoskeletal: She exhibits no edema.  Neurological: She is alert and oriented to person, place, and time.  Skin: Skin is warm and dry.  Psychiatric: She has a normal mood and affect.  Nursing note and vitals reviewed.   BP 120/68 (BP Location: Right Arm, Patient Position: Sitting, Cuff Size: Large)   Pulse 78   Temp 97.6 F (36.4 C) (Oral)   Wt 237 lb 3.2  oz (107.6 kg)   SpO2 95% Comment: RA  BMI 39.47 kg/m  Wt Readings from Last 3 Encounters:  09/05/16 237 lb 3.2 oz (107.6 kg)  06/06/16 240 lb 3.2 oz (109 kg)  02/12/16 240 lb 6 oz (109 kg)     Lab Results  Component Value Date   WBC 8.1 09/05/2016   HGB 14.8 09/05/2016   HCT 45.0 09/05/2016   PLT 169 09/05/2016   GLUCOSE 107 (H) 09/05/2016   CHOL 200 (H) 09/05/2016   TRIG 96 09/05/2016   HDL 71 09/05/2016   LDLDIRECT 189.0 06/06/2016   LDLCALC 110 (H) 09/05/2016   ALT 39 (H) 09/05/2016   AST 27 09/05/2016   NA 140 09/05/2016   K 4.8 09/05/2016  CL 103 09/05/2016   CREATININE 0.56 09/05/2016   BUN 14 09/05/2016   CO2 25 09/05/2016   TSH 0.79 09/05/2016   HGBA1C 5.3 09/05/2016    Lab Results  Component Value Date   TSH 0.79 09/05/2016   Lab Results  Component Value Date   WBC 8.1 09/05/2016   HGB 14.8 09/05/2016   HCT 45.0 09/05/2016   MCV 93.9 09/05/2016   PLT 169 09/05/2016   Lab Results  Component Value Date   NA 140 09/05/2016   K 4.8 09/05/2016   CO2 25 09/05/2016   GLUCOSE 107 (H) 09/05/2016   BUN 14 09/05/2016   CREATININE 0.56 09/05/2016   BILITOT 0.7 09/05/2016   ALKPHOS 69 09/05/2016   AST 27 09/05/2016   ALT 39 (H) 09/05/2016   PROT 7.0 09/05/2016   ALBUMIN 4.4 09/05/2016   CALCIUM 10.3 09/05/2016   GFR 133.26 06/06/2016   Lab Results  Component Value Date   CHOL 200 (H) 09/05/2016   Lab Results  Component Value Date   HDL 71 09/05/2016   Lab Results  Component Value Date   LDLCALC 110 (H) 09/05/2016   Lab Results  Component Value Date   TRIG 96 09/05/2016   Lab Results  Component Value Date   CHOLHDL 2.8 09/05/2016   Lab Results  Component Value Date   HGBA1C 5.3 09/05/2016       Assessment & Plan:   Problem List Items Addressed This Visit    Hypothyroidism    On Levothyroxine, continue to monitor      MORBID OBESITY    Encouraged DASH diet, decrease po intake and increase exercise as tolerated. Needs 7-8  hours of sleep nightly. Avoid trans fats, eat small, frequent meals every 4-5 hours with lean proteins, complex carbs and healthy fats. Minimize simple carbs      Depression with anxiety    Stable on Sertraline and Alprazolam, no changes today      Essential hypertension    Well controlled, no changes to meds. Encouraged heart healthy diet such as the DASH diet and exercise as tolerated.       Relevant Orders   CBC (Completed)   Comprehensive metabolic panel (Completed)   TSH (Completed)   Backache    Encouraged moist heat and gentle stretching as tolerated. May try NSAIDs and prescription meds as directed and report if symptoms worsen or seek immediate care. No increase in pain meds without referral to pain management      HYPERGLYCEMIA    hgba1c acceptable, minimize simple carbs. Increase exercise as tolerated. Continue current meds      Relevant Orders   Hemoglobin A1c (Completed)   Vitamin D deficiency    Taking daily supplements intermittently and weekly dose.       Relevant Orders   VITAMIN D 25 Hydroxy (Vit-D Deficiency, Fractures) (Completed)   Preventative health care    Referred to GYN for ongoing care      Breast cancer screening    MGM ordered      Relevant Orders   MM SCREENING BREAST TOMO BILATERAL   Cervical cancer screening   Relevant Orders   Ambulatory referral to Gynecology   Upper respiratory infection    Other Visit Diagnoses    Hyperlipidemia, mixed    -  Primary   Relevant Orders   Lipid panel (Completed)      I am having Ms. Poulson maintain her beclomethasone, zolpidem, triamterene-hydrochlorothiazide, sertraline, lisinopril, metoprolol succinate, Vitamin D (Ergocalciferol), ALPRAZolam,  and atorvastatin.  No orders of the defined types were placed in this encounter.  Penni Homans, MD

## 2016-09-28 NOTE — Assessment & Plan Note (Signed)
Stable on Sertraline and Alprazolam, no changes today

## 2016-09-28 NOTE — Assessment & Plan Note (Signed)
Encouraged moist heat and gentle stretching as tolerated. May try NSAIDs and prescription meds as directed and report if symptoms worsen or seek immediate care. No increase in pain meds without referral to pain management

## 2016-09-30 DIAGNOSIS — J069 Acute upper respiratory infection, unspecified: Secondary | ICD-10-CM | POA: Insufficient documentation

## 2016-09-30 NOTE — Assessment & Plan Note (Signed)
Referred to GYN for ongoing care.  

## 2016-09-30 NOTE — Assessment & Plan Note (Signed)
Encouraged DASH diet, decrease po intake and increase exercise as tolerated. Needs 7-8 hours of sleep nightly. Avoid trans fats, eat small, frequent meals every 4-5 hours with lean proteins, complex carbs and healthy fats. Minimize simple carbs 

## 2016-10-02 ENCOUNTER — Ambulatory Visit: Payer: Self-pay | Admitting: Women's Health

## 2016-10-23 ENCOUNTER — Other Ambulatory Visit: Payer: Self-pay | Admitting: Family Medicine

## 2016-10-27 ENCOUNTER — Telehealth: Payer: Self-pay | Admitting: Family Medicine

## 2016-10-27 NOTE — Telephone Encounter (Signed)
Patient is requesting a refill of fentaNYL (DURAGESIC) 50 MCG/HR, and oxyCODONE (ROXICODONE) 15 MG immediate release tablet Please advise  Phone: [phone] or [phone]

## 2016-10-28 MED ORDER — OXYCODONE HCL 15 MG PO TABS: 15.0000 mg | ORAL_TABLET | Freq: Three times a day (TID) | ORAL | 0 refills | Status: DC | PRN

## 2016-10-28 MED ORDER — FENTANYL 50 MCG/HR TD PT72: 50.0000 ug | MEDICATED_PATCH | TRANSDERMAL | 0 refills | Status: DC

## 2016-10-28 NOTE — Telephone Encounter (Signed)
Requesting: Fentanyl and Oxycodone Contract   Signed on 07/29/2016 UDS  Moderate--10/29/2016 Last OV   09/05/2016--NEXT SCHEDULED OV  12/04/2016  Last Refill   Fentanyl  #10  09/26/2016                    Oxycodone  #70  09/26/2016  Please Advise

## 2016-10-28 NOTE — Telephone Encounter (Signed)
Printed and PCP signed. Called the patient informed both are ready for pickup at the front desk.

## 2016-10-28 NOTE — Telephone Encounter (Signed)
Ok to refill requested meds 

## 2016-10-28 NOTE — Addendum Note (Signed)
Addended by: Sharon Seller B on: 10/28/2016 05:18 PM   Modules accepted: Orders

## 2016-11-02 ENCOUNTER — Other Ambulatory Visit: Payer: Self-pay | Admitting: Family Medicine

## 2016-11-18 ENCOUNTER — Other Ambulatory Visit: Payer: Self-pay | Admitting: Family Medicine

## 2016-11-22 ENCOUNTER — Other Ambulatory Visit: Payer: Self-pay | Admitting: Family Medicine

## 2016-11-24 NOTE — Telephone Encounter (Signed)
Requesting:   zolpidem Contract     07/29/2016 UDS    Moderate---due 10/29/2016 Last OV   09/05/2016 Last Refill    #30 with 2 refills on 02/12/2016  Please Advise

## 2016-11-24 NOTE — Telephone Encounter (Signed)
Need updated UDS but can have #30 with 1 rf

## 2016-11-25 ENCOUNTER — Telehealth: Payer: Self-pay

## 2016-11-25 ENCOUNTER — Other Ambulatory Visit: Payer: Self-pay

## 2016-11-25 MED ORDER — OXYCODONE HCL 15 MG PO TABS: 15.0000 mg | ORAL_TABLET | Freq: Three times a day (TID) | ORAL | 0 refills | Status: DC | PRN

## 2016-11-25 MED ORDER — FENTANYL 50 MCG/HR TD PT72: 50.0000 ug | MEDICATED_PATCH | TRANSDERMAL | 0 refills | Status: DC

## 2016-11-25 NOTE — Telephone Encounter (Signed)
Printed prescription Contacted the patient to pickup and update UDS.  When I called her she forgot to request fentanyl and oxycodone Last refill on fentanyl #10   10/28/2016 Last refill on oxycodone  #70  10/28/2016

## 2016-11-25 NOTE — Telephone Encounter (Addendum)
Relation to pt: self Call back number:[phone]   Reason for call:  Patient states pharmacy requesting electronic request for the medication below and patient states she would like a phone call, please advise

## 2016-11-25 NOTE — Telephone Encounter (Signed)
Hard copy of Rx request for Oxycodone 15mg  (take one tab po TID prn for pain) and Fentanyl 50MCG/HR (place one patch onto the skin every 3 days) to [id number] [phone]).

## 2016-11-26 NOTE — Telephone Encounter (Addendum)
Patient calling back checking on the status of medication request mentioned below, please advise best #

## 2016-11-27 NOTE — Telephone Encounter (Signed)
Informed patient Rx is at the front desk ready for pick up.

## 2016-11-27 NOTE — Telephone Encounter (Signed)
Schedule II Controlled Substances can't be faxed. Please call Patient and advise her that she'll need to stop by the office to pick these Rx up. Rx has been placed in the secure file.

## 2016-11-28 DIAGNOSIS — Z79899 Other long term (current) drug therapy: Secondary | ICD-10-CM | POA: Diagnosis not present

## 2016-11-28 DIAGNOSIS — Z79891 Long term (current) use of opiate analgesic: Secondary | ICD-10-CM | POA: Diagnosis not present

## 2016-12-04 ENCOUNTER — Ambulatory Visit: Payer: Self-pay | Admitting: Family Medicine

## 2016-12-23 ENCOUNTER — Telehealth: Payer: Self-pay | Admitting: Family Medicine

## 2016-12-23 MED ORDER — FENTANYL 50 MCG/HR TD PT72: 50.0000 ug | MEDICATED_PATCH | TRANSDERMAL | 0 refills | Status: DC

## 2016-12-23 MED ORDER — OXYCODONE HCL 15 MG PO TABS: 15.0000 mg | ORAL_TABLET | Freq: Three times a day (TID) | ORAL | 0 refills | Status: DC | PRN

## 2016-12-23 NOTE — Telephone Encounter (Signed)
Hardcopy's printed/PCP signed Patient informed to pickup hardcopy's at the front desk.

## 2016-12-23 NOTE — Telephone Encounter (Signed)
OK to refill requested meds

## 2016-12-23 NOTE — Telephone Encounter (Signed)
Requesting:   Oxycodone and fentanyl Contract   Signed on 07/29/2016 UDS   Moderate done on 10/29/2016 Last OV   09/05/2016--next scheduled appt. Is on 01/13/2017 Last Refill   Oxycodone  #70 on 11/25/2016                    Fentanyl  #10 on 11/25/2016  Please Advise

## 2016-12-23 NOTE — Telephone Encounter (Signed)
Caller name: Relationship to patient: Self Can be reached: [phone]   Pharmacy:  [id number] - [place], [place] [phone] (Phone) [phone] (Fax)     Reason for call: Request refill on fentaNYL (DURAGESIC) 50 MCG/HR  oxyCODONE (ROXICODONE) 15 MG immediate release tablet [[id number]

## 2017-01-13 ENCOUNTER — Encounter: Payer: Self-pay | Admitting: Family Medicine

## 2017-01-13 ENCOUNTER — Ambulatory Visit (INDEPENDENT_AMBULATORY_CARE_PROVIDER_SITE_OTHER): Payer: BLUE CROSS/BLUE SHIELD | Admitting: Family Medicine

## 2017-01-13 ENCOUNTER — Encounter (INDEPENDENT_AMBULATORY_CARE_PROVIDER_SITE_OTHER): Payer: Self-pay

## 2017-01-13 VITALS — BP 130/72 | HR 66 | Temp 98.1°F | Resp 18 | Wt 246.4 lb

## 2017-01-13 DIAGNOSIS — M79671 Pain in right foot: Secondary | ICD-10-CM

## 2017-01-13 DIAGNOSIS — R7309 Other abnormal glucose: Secondary | ICD-10-CM | POA: Diagnosis not present

## 2017-01-13 DIAGNOSIS — G8929 Other chronic pain: Secondary | ICD-10-CM

## 2017-01-13 DIAGNOSIS — M25561 Pain in right knee: Secondary | ICD-10-CM

## 2017-01-13 DIAGNOSIS — G4700 Insomnia, unspecified: Secondary | ICD-10-CM | POA: Diagnosis not present

## 2017-01-13 DIAGNOSIS — E785 Hyperlipidemia, unspecified: Secondary | ICD-10-CM | POA: Diagnosis not present

## 2017-01-13 DIAGNOSIS — E559 Vitamin D deficiency, unspecified: Secondary | ICD-10-CM

## 2017-01-13 DIAGNOSIS — M79672 Pain in left foot: Secondary | ICD-10-CM | POA: Insufficient documentation

## 2017-01-13 DIAGNOSIS — I10 Essential (primary) hypertension: Secondary | ICD-10-CM

## 2017-01-13 HISTORY — DX: Pain in right foot: M79.671

## 2017-01-13 MED ORDER — COLCHICINE 0.6 MG PO TABS: ORAL_TABLET | ORAL | 1 refills | Status: DC

## 2017-01-13 MED ORDER — KETOROLAC TROMETHAMINE 30 MG/ML IJ SOLN
30.0000 mg | Freq: Once | INTRAMUSCULAR | Status: AC
  Administered 2017-01-13: 30 mg via INTRAMUSCULAR

## 2017-01-13 MED ORDER — PROMETHAZINE HCL 25 MG PO TABS: 25.0000 mg | ORAL_TABLET | Freq: Four times a day (QID) | ORAL | 0 refills | Status: DC | PRN

## 2017-01-13 MED ORDER — KETOROLAC TROMETHAMINE 10 MG PO TABS: 10.0000 mg | ORAL_TABLET | Freq: Two times a day (BID) | ORAL | 0 refills | Status: DC | PRN

## 2017-01-13 NOTE — Assessment & Plan Note (Signed)
Tolerating statin, encouraged heart healthy diet, avoid trans fats, minimize simple carbs and saturated fats. Increase exercise as tolerated. Taking Atorvastatin 10 mg twice a week

## 2017-01-13 NOTE — Assessment & Plan Note (Signed)
Agrees to stop Ambien and try to sleep without med

## 2017-01-13 NOTE — Assessment & Plan Note (Signed)
hgba1c acceptable, minimize simple carbs. Increase exercise as tolerated.  

## 2017-01-13 NOTE — Progress Notes (Signed)
Subjective:  I acted as a Education administrator for Dr. Charlett Blake. [place], [place]   Patient ID: [name] [name], female    DOB: 1961-01-27, 56 y.o.   MRN: [id number]  Chief Complaint  Patient presents with  . Follow-up  . Hypertension  . Hyperlipidemia    HPI  Patient is in today for 3 monh follow up for hypertension, hyperlipidemia and other medical concerns.  Patient c/o of Right foot pain.  She was working in her garden roughly 5 days ago. She denies injury but her foot started aching then over past several days pain has escalated to where even light touch is very painful. Worse with weight bearing but hurts all day. Has other persistent pain in knee and back but has never experienced this pain before. Denies CP/palp/SOB/HA/congestion/fevers/GI or GU c/o. Taking meds as prescribed Patient Care Team: Mosie Lukes, MD as PCP - General (Family Medicine)   Past Medical History:  Diagnosis Date  . Acute foot pain, right 01/13/2017  . Acute upper respiratory infections of unspecified site 06/12/2014  . Anxiety and depression    chronic  . Breast cancer screening 09/05/2016  . Cervical cancer screening 09/05/2016  . Chronic back pain   . Depression with anxiety 08/23/2008   Qualifier: Diagnosis of  By: Niel Hummer MD, [place] History of fibromyalgia    suspected   . Hypertension   . Insomnia   . Knee pain, right 06/24/2015  . Morbid obesity ([place])   . Ovarian cyst, bilateral   . Preventative health care 06/24/2015  . PTSD (post-traumatic stress disorder)   . Vitamin D deficiency 06/24/2015    Past Surgical History:  Procedure Laterality Date  . OVARIAN CYST REMOVAL      Family History  Problem Relation Age of Onset  . Rheum arthritis Father   . Diabetes Father   . Congestive Heart Failure Father   . Hypertension Father   . Stroke Father   . Cancer Maternal Uncle 34    colon  . Hip fracture Maternal Grandmother   . COPD Maternal Grandfather 87    colon  . Lymphoma Paternal  Grandmother   . ADD / ADHD Neg Hx   . Depression Neg Hx   . Alcohol abuse Neg Hx   . Drug abuse Neg Hx     Social History   Social History  . Marital status: Divorced    Spouse name: N/A  . Number of children: N/A  . Years of education: N/A   Occupational History  . Not on file.   Social History Main Topics  . Smoking status: Former Smoker    Quit date: 04/04/1989  . Smokeless tobacco: Never Used  . Alcohol use Yes     Comment: occassionally- 2 glassses of wine 2x/month  . Drug use: No     Comment: take meds as prescribed  . Sexual activity: Not on file   Other Topics Concern  . Not on file   Social History Narrative  . No narrative on file    Outpatient Medications Prior to Visit  Medication Sig Dispense Refill  . ALPRAZolam (XANAX) 0.5 MG tablet Take 1 tablet (0.5 mg total) by mouth 3 (three) times daily as needed for anxiety. 90 tablet 1  . atorvastatin (LIPITOR) 10 MG tablet TAKE 1 TABLET BY MOUTH TWICE WEEKLY AT BEDTIME 8 tablet 1  . beclomethasone (BECONASE-AQ) 42 MCG/SPRAY nasal spray Place 1 spray into both nostrils 2 (two) times daily. Dose  is for each nostril. 25 g 1  . fentaNYL (DURAGESIC) 50 MCG/HR Place 1 patch (50 mcg total) onto the skin every 3 (three) days. 10 patch 0  . lisinopril (PRINIVIL,ZESTRIL) 10 MG tablet TAKE 1 TABLET (10 MG TOTAL) BY MOUTH DAILY. 30 tablet 5  . metoprolol succinate (TOPROL-XL) 25 MG 24 hr tablet TAKE 1 TABLET (25 MG TOTAL) BY MOUTH DAILY. 30 tablet 5  . oxyCODONE (ROXICODONE) 15 MG immediate release tablet Take 1 tablet (15 mg total) by mouth 3 (three) times daily as needed for pain. 70 tablet 0  . sertraline (ZOLOFT) 100 MG tablet TAKE 1 TABLET BY MOUTH EVERY DAY 90 tablet 1  . triamterene-hydrochlorothiazide (MAXZIDE-25) 37.5-25 MG tablet TAKE 1 TABLET BY MOUTH DAILY. 30 tablet 5  . Vitamin D, Ergocalciferol, (DRISDOL) 50000 units CAPS capsule TAKE 1 CAPSULE (50,000 UNITS TOTAL) BY MOUTH EVERY 7 (SEVEN) DAYS. 4 capsule 3  .  zolpidem (AMBIEN) 10 MG tablet TAKE 1 TABLET BY MOUTH AT BEDTIME AS NEEDED FOR SLEEP 30 tablet 0   No facility-administered medications prior to visit.     Allergies  Allergen Reactions  . Cymbalta [Duloxetine Hcl] Other (See Comments)    Headaches  . Fiorinal [Butalbital-Aspirin-Caffeine] Other (See Comments)    Mental Clarity.    Review of Systems  Constitutional: Negative for fever and malaise/fatigue.  HENT: Negative for congestion.   Eyes: Negative for blurred vision.  Respiratory: Negative for shortness of breath.   Cardiovascular: Negative for chest pain, palpitations and leg swelling.  Gastrointestinal: Negative for abdominal pain, blood in stool and nausea.  Genitourinary: Negative for dysuria and frequency.  Musculoskeletal: Positive for back pain and joint pain. Negative for falls.  Skin: Negative for rash.  Neurological: Negative for dizziness, loss of consciousness and headaches.  Endo/Heme/Allergies: Negative for environmental allergies.  Psychiatric/Behavioral: Negative for depression. The patient is not nervous/anxious.        Objective:    Physical Exam  Constitutional: She is oriented to person, place, and time. She appears well-developed and well-nourished. No distress.  HENT:  Head: Normocephalic and atraumatic.  Nose: Nose normal.  Eyes: Right eye exhibits no discharge. Left eye exhibits no discharge.  Neck: Normal range of motion. Neck supple.  Cardiovascular: Normal rate, regular rhythm and intact distal pulses.   No murmur heard. Pulmonary/Chest: Effort normal and breath sounds normal.  Abdominal: Soft. Bowel sounds are normal. There is no tenderness.  Musculoskeletal: She exhibits no edema.  Base of first toe left foot, red, swollen, hot and tender to touch.   Neurological: She is alert and oriented to person, place, and time.  Skin: Skin is warm and dry.  Psychiatric: She has a normal mood and affect.  Nursing note and vitals reviewed.   BP  130/72 (BP Location: Left Arm, Patient Position: Sitting, Cuff Size: Large)   Pulse 66   Temp 98.1 F (36.7 C) (Oral)   Resp 18   Wt 246 lb 6.4 oz (111.8 kg)   SpO2 96%   BMI 41.00 kg/m  Wt Readings from Last 3 Encounters:  01/13/17 246 lb 6.4 oz (111.8 kg)  09/05/16 237 lb 3.2 oz (107.6 kg)  06/06/16 240 lb 3.2 oz (109 kg)   BP Readings from Last 3 Encounters:  01/13/17 130/72  09/05/16 120/68  06/06/16 122/78     Immunization History  Administered Date(s) Administered  . Influenza Split 08/08/2011  . Influenza Whole 08/22/2009, 07/01/2010  . Td 01/09/2009  . Zoster 09/27/2015  Health Maintenance  Topic Date Due  . HIV Screening  10/24/1976  . PAP SMEAR  04/30/2013  . MAMMOGRAM  01/28/2016  . INFLUENZA VACCINE  09/05/2017 (Originally 04/29/2017)  . TETANUS/TDAP  01/10/2019  . COLONOSCOPY  05/03/2022  . Hepatitis C Screening  Completed    Lab Results  Component Value Date   WBC 7.0 01/13/2017   HGB 12.7 01/13/2017   HCT 38.3 01/13/2017   PLT 169.0 01/13/2017   GLUCOSE 112 (H) 01/13/2017   CHOL 217 (H) 01/13/2017   TRIG 355.0 (H) 01/13/2017   HDL 64.90 01/13/2017   LDLDIRECT 107.0 01/13/2017   LDLCALC 110 (H) 09/05/2016   ALT 15 01/13/2017   AST 19 01/13/2017   NA 139 01/13/2017   K 4.2 01/13/2017   CL 103 01/13/2017   CREATININE 0.57 01/13/2017   BUN 14 01/13/2017   CO2 29 01/13/2017   TSH 2.03 01/13/2017   HGBA1C 5.3 09/05/2016    Lab Results  Component Value Date   TSH 2.03 01/13/2017   Lab Results  Component Value Date   WBC 7.0 01/13/2017   HGB 12.7 01/13/2017   HCT 38.3 01/13/2017   MCV 99.0 01/13/2017   PLT 169.0 01/13/2017   Lab Results  Component Value Date   NA 139 01/13/2017   K 4.2 01/13/2017   CO2 29 01/13/2017   GLUCOSE 112 (H) 01/13/2017   BUN 14 01/13/2017   CREATININE 0.57 01/13/2017   BILITOT 0.4 01/13/2017   ALKPHOS 77 01/13/2017   AST 19 01/13/2017   ALT 15 01/13/2017   PROT 6.7 01/13/2017   ALBUMIN 4.0  01/13/2017   CALCIUM 10.3 01/13/2017   GFR 116.95 01/13/2017   Lab Results  Component Value Date   CHOL 217 (H) 01/13/2017   Lab Results  Component Value Date   HDL 64.90 01/13/2017   Lab Results  Component Value Date   LDLCALC 110 (H) 09/05/2016   Lab Results  Component Value Date   TRIG 355.0 (H) 01/13/2017   Lab Results  Component Value Date   CHOLHDL 3 01/13/2017   Lab Results  Component Value Date   HGBA1C 5.3 09/05/2016         Assessment & Plan:   Problem List Items Addressed This Visit    Hyperlipidemia    Tolerating statin, encouraged heart healthy diet, avoid trans fats, minimize simple carbs and saturated fats. Increase exercise as tolerated. Taking Atorvastatin 10 mg twice a week      Relevant Orders   Lipid panel (Completed)   Essential hypertension - Primary    Well controlled, no changes to meds. Encouraged heart healthy diet such as the DASH diet and exercise as tolerated.       Relevant Orders   CBC (Completed)   Comprehensive metabolic panel (Completed)   TSH (Completed)   Insomnia    Agrees to stop Ambien and try to sleep without med      HYPERGLYCEMIA    hgba1c acceptable, minimize simple carbs. Increase exercise as tolerated.       Relevant Orders   TSH (Completed)   Vitamin D deficiency    Daily supplement and check level      Knee pain, right    With low back pain, may continue current meds with monitoring.       Acute foot pain, right    Believes she injured it while working in her garden about 5 days ago. It has been getting increasingly more painful for days. Very sensitive  to touch suspicious for gout check uric acid, hydrate. Avoid offending foods. Given shot of Toradol 30 mg IM now, Start Colchicine 0.6 mg tabs, 2 tabs po once then 1 tab po q 2 hours prn pain til pain relief, max of 6 tabs or diarrhea. Then after 24 hours if still in pain given Toradol 10 mg tabs 1 tab po bid prn pain x 3 days       Relevant  Medications   ketorolac (TORADOL) 30 MG/ML injection 30 mg (Completed)   Other Relevant Orders   Uric acid (Completed)      I have discontinued Ms. Salata's promethazine and zolpidem. I am also having her start on ketorolac, colchicine, and promethazine. Additionally, I am having her maintain her beclomethasone, ALPRAZolam, atorvastatin, Vitamin D (Ergocalciferol), triamterene-hydrochlorothiazide, sertraline, metoprolol succinate, lisinopril, oxyCODONE, and fentaNYL. We administered ketorolac.  Meds ordered this encounter  Medications  . ketorolac (TORADOL) 10 MG tablet    Sig: Take 1 tablet (10 mg total) by mouth 2 (two) times daily as needed.    Dispense:  6 tablet    Refill:  0  . colchicine 0.6 MG tablet    Sig: 2 tabs po once then 1 tab po q 2 hours prn pain til pain gone, max of 6 tabs in 24 hours or intolerable diarrhea    Dispense:  6 tablet    Refill:  1  . promethazine (PHENERGAN) 25 MG tablet    Sig: Take 1 tablet (25 mg total) by mouth every 6 (six) hours as needed for nausea or vomiting.    Dispense:  20 tablet    Refill:  0  . ketorolac (TORADOL) 30 MG/ML injection 30 mg    CMA served as scribe during this visit. History, Physical and Plan performed by medical provider. Documentation and orders reviewed and attested to.  Penni Homans, MD

## 2017-01-13 NOTE — Assessment & Plan Note (Signed)
With low back pain, may continue current meds with monitoring.

## 2017-01-13 NOTE — Assessment & Plan Note (Signed)
Well controlled, no changes to meds. Encouraged heart healthy diet such as the DASH diet and exercise as tolerated.  °

## 2017-01-13 NOTE — Assessment & Plan Note (Signed)
Daily supplement and check level

## 2017-01-13 NOTE — Progress Notes (Signed)
Pre visit review using our clinic review tool, if applicable. No additional management support is needed unless otherwise documented below in the visit note. 

## 2017-01-13 NOTE — Patient Instructions (Signed)
0  Gout Gout is painful swelling that can occur in some of your joints. Gout is a type of arthritis. This condition is caused by having too much uric acid in your body. Uric acid is a chemical that forms when your body breaks down substances called purines. Purines are important for building body proteins. When your body has too much uric acid, sharp crystals can form and build up inside your joints. This causes pain and swelling. Gout attacks can happen quickly and be very painful (acute gout). Over time, the attacks can affect more joints and become more frequent (chronic gout). Gout can also cause uric acid to build up under your skin and inside your kidneys. What are the causes? This condition is caused by too much uric acid in your blood. This can occur because:  Your kidneys do not remove enough uric acid from your blood. This is the most common cause.  Your body makes too much uric acid. This can occur with some cancers and cancer treatments. It can also occur if your body is breaking down too many red blood cells (hemolytic anemia).  You eat too many foods that are high in purines. These foods include organ meats and some seafood. Alcohol, especially beer, is also high in purines. A gout attack may be triggered by trauma or stress. What increases the risk? This condition is more likely to develop in people who:  Have a family history of gout.  Are female and middle-aged.  Are female and have gone through menopause.  Are obese.  Frequently drink alcohol, especially beer.  Are dehydrated.  Lose weight too quickly.  Have an organ transplant.  Have lead poisoning.  Take certain medicines, including aspirin, cyclosporine, diuretics, levodopa, and niacin.  Have kidney disease or psoriasis. What are the signs or symptoms? An attack of acute gout happens quickly. It usually occurs in just one joint. The most common place is the big toe. Attacks often start at night. Other joints  that may be affected include joints of the feet, ankle, knee, fingers, wrist, or elbow. Symptoms may include:  Severe pain.  Warmth.  Swelling.  Stiffness.  Tenderness. The affected joint may be very painful to touch.  Shiny, red, or purple skin.  Chills and fever. Chronic gout may cause symptoms more frequently. More joints may be involved. You may also have white or yellow lumps (tophi) on your hands or feet or in other areas near your joints. How is this diagnosed? This condition is diagnosed based on your symptoms, medical history, and physical exam. You may have tests, such as:  Blood tests to measure uric acid levels.  Removal of joint fluid with a needle (aspiration) to look for uric acid crystals.  X-rays to look for joint damage. How is this treated? Treatment for this condition has two phases: treating an acute attack and preventing future attacks. Acute gout treatment may include medicines to reduce pain and swelling, including:  NSAIDs.  Steroids. These are strong anti-inflammatory medicines that can be taken by mouth (orally) or injected into a joint.  Colchicine. This medicine relieves pain and swelling when it is taken soon after an attack. It can be given orally or through an IV tube. Preventive treatment may include:  Daily use of smaller doses of NSAIDs or colchicine.  Use of a medicine that reduces uric acid levels in your blood.  Changes to your diet. You may need to see a specialist about healthy eating (dietitian). Follow these instructions  at home: During a Gout Attack   If directed, apply ice to the affected area:  Put ice in a plastic bag.  Place a towel between your skin and the bag.  Leave the ice on for 20 minutes, 2-3 times a day.  Rest the joint as much as possible. If the affected joint is in your leg, you may be given crutches to use.  Raise (elevate) the affected joint above the level of your heart as often as possible.  Drink  enough fluids to keep your urine clear or pale yellow.  Take over-the-counter and prescription medicines only as told by your health care provider.  Do not drive or operate heavy machinery while taking prescription pain medicine.  Follow instructions from your health care provider about eating or drinking restrictions.  Return to your normal activities as told by your health care provider. Ask your health care provider what activities are safe for you. Avoiding Future Gout Attacks   Follow a low-purine diet as told by your dietitian or health care provider. Avoid foods and drinks that are high in purines, including liver, kidney, anchovies, asparagus, herring, mushrooms, mussels, and beer.  Limit alcohol intake to no more than 1 drink a day for nonpregnant women and 2 drinks a day for men. One drink equals 12 oz of beer, 5 oz of wine, or 1 oz of hard liquor.  Maintain a healthy weight or lose weight if you are overweight. If you want to lose weight, talk with your health care provider. It is important that you do not lose weight too quickly.  Start or maintain an exercise program as told by your health care provider.  Drink enough fluids to keep your urine clear or pale yellow.  Take over-the-counter and prescription medicines only as told by your health care provider.  Keep all follow-up visits as told by your health care provider. This is important. Contact a health care provider if:  You have another gout attack.  You continue to have symptoms of a gout attack after10 days of treatment.  You have side effects from your medicines.  You have chills or a fever.  You have burning pain when you urinate.  You have pain in your lower back or belly. Get help right away if:  You have severe or uncontrolled pain.  You cannot urinate. This information is not intended to replace advice given to you by your health care provider. Make sure you discuss any questions you have with your  health care provider. Document Released: 09/12/2000 Document Revised: 02/21/2016 Document Reviewed: 06/28/2015 Elsevier Interactive Patient Education  2017 [employer].

## 2017-01-13 NOTE — Assessment & Plan Note (Signed)
Believes she injured it while working in her garden about 5 days ago. It has been getting increasingly more painful for days. Very sensitive to touch suspicious for gout check uric acid, hydrate. Avoid offending foods. Given shot of Toradol 30 mg IM now, Start Colchicine 0.6 mg tabs, 2 tabs po once then 1 tab po q 2 hours prn pain til pain relief, max of 6 tabs or diarrhea. Then after 24 hours if still in pain given Toradol 10 mg tabs 1 tab po bid prn pain x 3 days

## 2017-01-14 LAB — COMPREHENSIVE METABOLIC PANEL
ALBUMIN: 4 g/dL (ref 3.5–5.2)
ALK PHOS: 77 U/L (ref 39–117)
ALT: 15 U/L (ref 0–35)
AST: 19 U/L (ref 0–37)
BUN: 14 mg/dL (ref 6–23)
CHLORIDE: 103 meq/L (ref 96–112)
CO2: 29 mEq/L (ref 19–32)
Calcium: 10.3 mg/dL (ref 8.4–10.5)
Creatinine, Ser: 0.57 mg/dL (ref 0.40–1.20)
GFR: 116.95 mL/min (ref >60.00)
Glucose, Bld: 112 mg/dL — ABNORMAL HIGH (ref 70–99)
POTASSIUM: 4.2 meq/L (ref 3.5–5.1)
Sodium: 139 mEq/L (ref 135–145)
TOTAL PROTEIN: 6.7 g/dL (ref 6.0–8.3)
Total Bilirubin: 0.4 mg/dL (ref 0.2–1.2)

## 2017-01-14 LAB — CBC
HEMATOCRIT: 38.3 % (ref 36.0–46.0)
Hemoglobin: 12.7 g/dL (ref 12.0–15.0)
MCHC: 33.2 g/dL (ref 30.0–36.0)
MCV: 99 fl (ref 78.0–100.0)
Platelets: 169 10*3/uL (ref 150.0–400.0)
RBC: 3.87 Mil/uL (ref 3.87–5.11)
RDW: 14.3 % (ref 11.5–15.5)
WBC: 7 10*3/uL (ref 4.0–10.5)

## 2017-01-14 LAB — LIPID PANEL
CHOLESTEROL: 217 mg/dL — AB (ref 0–200)
HDL: 64.9 mg/dL (ref >39.00)
NonHDL: 151.82
TRIGLYCERIDES: 355 mg/dL — AB (ref 0.0–149.0)
Total CHOL/HDL Ratio: 3
VLDL: 71 mg/dL — AB (ref 0.0–40.0)

## 2017-01-14 LAB — TSH: TSH: 2.03 u[IU]/mL (ref 0.35–4.50)

## 2017-01-14 LAB — URIC ACID: Uric Acid, Serum: 6.9 mg/dL (ref 2.4–7.0)

## 2017-01-14 LAB — LDL CHOLESTEROL, DIRECT: Direct LDL: 107 mg/dL

## 2017-01-15 ENCOUNTER — Other Ambulatory Visit: Payer: Self-pay | Admitting: Family Medicine

## 2017-01-16 ENCOUNTER — Telehealth: Payer: Self-pay | Admitting: Family Medicine

## 2017-01-16 NOTE — Telephone Encounter (Signed)
Requesting:fentanyl Contract: 01/13/17 UDS:07/29/16 Last OV:01/13/17 Last Refill:12/23/16 #10 patches 0-rf  Please Advise PC    Its too soon   Also oxycodone #70-0rf

## 2017-01-16 NOTE — Telephone Encounter (Signed)
Per her controlled substance contract, she was advised that medication will not be refilled on emergencies. I have advised patient before per [place] Protocol that we have to follow please allow 24-48 BUSINESS HOURS in order to receive your rx. Patient is well aware of the contract that was signed on 01/13/17.  PCP is out of the office and will return on Monday. RX will be ready by 8:00am Monday morning.   PC

## 2017-01-16 NOTE — Telephone Encounter (Signed)
Relation to MV:HQIO Call back number:[phone]   Reason for call:  Patient requesting a refill oxyCODONE (ROXICODONE) 15 MG immediate release tablet , fentaNYL (DURAGESIC) 50 MCG/HR, patient states she's going out of town Sunday and would like to pick up Rx today. Informed patient PCP is out of the office and office protocol,patient voice understanding and states she will run out by the time she gets back in town, please advise

## 2017-01-16 NOTE — Telephone Encounter (Signed)
Requesting:oxycodone Contract: 01/13/17 UDS:07/29/16 moderate Last OV: 01/13/17 Last Refill:  12/23/16   Please Advise  pc

## 2017-01-16 NOTE — Telephone Encounter (Signed)
Alprazolam Last filled 07/12/16 #90 w/1RF Last ov 01/13/17 Next ov 04/14/17 UDS: 07/29/17 moderate Contract signed 01/13/17

## 2017-01-18 NOTE — Telephone Encounter (Signed)
She can pick up meds when they are due.

## 2017-01-19 ENCOUNTER — Other Ambulatory Visit: Payer: Self-pay | Admitting: Family Medicine

## 2017-01-19 NOTE — Telephone Encounter (Signed)
Faxed hardcopy for alprazolam to [facility]

## 2017-01-20 ENCOUNTER — Other Ambulatory Visit: Payer: Self-pay | Admitting: Family Medicine

## 2017-01-22 ENCOUNTER — Other Ambulatory Visit: Payer: Self-pay

## 2017-01-22 MED ORDER — FENTANYL 50 MCG/HR TD PT72: 50.0000 ug | MEDICATED_PATCH | TRANSDERMAL | 0 refills | Status: DC

## 2017-01-22 MED ORDER — OXYCODONE HCL 15 MG PO TABS: 15.0000 mg | ORAL_TABLET | Freq: Three times a day (TID) | ORAL | 0 refills | Status: DC | PRN

## 2017-01-22 NOTE — Telephone Encounter (Signed)
Medications have been approved by Dr. Charlett Blake. She can pick medications up in the front office

## 2017-01-22 NOTE — Telephone Encounter (Signed)
Patient has been contacted about rx in the front.    PC

## 2017-02-13 ENCOUNTER — Other Ambulatory Visit: Payer: Self-pay | Admitting: Family Medicine

## 2017-02-17 ENCOUNTER — Telehealth: Payer: Self-pay | Admitting: Family Medicine

## 2017-02-20 ENCOUNTER — Other Ambulatory Visit: Payer: Self-pay | Admitting: Family Medicine

## 2017-02-20 ENCOUNTER — Encounter: Payer: Self-pay | Admitting: Family Medicine

## 2017-02-20 MED ORDER — FENTANYL 50 MCG/HR TD PT72: 50.0000 ug | MEDICATED_PATCH | TRANSDERMAL | 0 refills | Status: DC

## 2017-02-20 MED ORDER — OXYCODONE HCL 15 MG PO TABS: 15.0000 mg | ORAL_TABLET | Freq: Three times a day (TID) | ORAL | 0 refills | Status: DC | PRN

## 2017-02-20 NOTE — Telephone Encounter (Signed)
Patient called at 5:50 stating that she sent her friend to pick up her prescription but it could not be given to the friend because she needs a UDS. Patient states she will be out of medication tomorrow and she was not informed that she needed to do a UDS before she could pick up the Rx. Patient was very upset that our office was about to close and she would be out of the medication until Tuesday.

## 2017-02-24 ENCOUNTER — Other Ambulatory Visit: Payer: Self-pay | Admitting: Family Medicine

## 2017-02-24 NOTE — Telephone Encounter (Signed)
I have addressed this in a mychart chain. Soon we will have a every 3 month appt for opiods and she will get all the meds then. For now she will just have to manage with our imperfect work flows as we do the best we are able with the new regs. Sorry

## 2017-02-25 ENCOUNTER — Other Ambulatory Visit: Payer: Self-pay | Admitting: Family Medicine

## 2017-02-25 ENCOUNTER — Telehealth: Payer: Self-pay | Admitting: Family Medicine

## 2017-02-25 MED ORDER — ATORVASTATIN CALCIUM 10 MG PO TABS: ORAL_TABLET | ORAL | 1 refills | Status: DC

## 2017-02-25 NOTE — Telephone Encounter (Signed)
Caller name: Shakia  Relation to pt: self Call back number: [phone] Pharmacy: [id number] - [place], [place] - [address] AT [place]:   Reason for call: Pt called wanting to inform that during her last visit with PCP pt was informed that rx for atorvastatin (LIPITOR) 10 MG tablet was going to be sent to the pharmacy for her to take 3 times a wk instead of 2 times a wk. Pt states that pharmacy has only for 2 times a wk and that they are needing a rx for 3 times a wk. Please advise.

## 2017-02-25 NOTE — Telephone Encounter (Signed)
Received a refill request for Xanax 0.5mg  (take 1 tab po TID prn for anxiety) #90, 0RF  Last RF: 07/18/2017 Last OV: 01/13/2017 Next OV: 04/21/2017 UDS: 08/07/14 controlled substance contract signed, no uds sample given, 08/16/15 controlled substance contract signed, uds sample given, Moderate risk, next screen 11/16/15.  07/29/16 controlled substance contract signed, uds sample given, moderate risk next screen 10/29/16. 11/27/16 uds sample given,  Forwarded to the Provider for review, approval, or denial.

## 2017-02-25 NOTE — Telephone Encounter (Signed)
Timing of refill is OK am willing to wait for UDS til next visit.

## 2017-02-25 NOTE — Telephone Encounter (Signed)
Sent in medication as PCP directed

## 2017-02-26 ENCOUNTER — Encounter: Payer: Self-pay | Admitting: Family Medicine

## 2017-02-26 DIAGNOSIS — Z79891 Long term (current) use of opiate analgesic: Secondary | ICD-10-CM | POA: Diagnosis not present

## 2017-02-26 DIAGNOSIS — Z79899 Other long term (current) drug therapy: Secondary | ICD-10-CM | POA: Diagnosis not present

## 2017-02-26 NOTE — Telephone Encounter (Signed)
Faxed hardcopy for Alprazolam to [facility]

## 2017-03-26 ENCOUNTER — Other Ambulatory Visit: Payer: Self-pay | Admitting: Family Medicine

## 2017-03-26 ENCOUNTER — Telehealth: Payer: Self-pay | Admitting: Family Medicine

## 2017-03-26 MED ORDER — OXYCODONE HCL 15 MG PO TABS: 15.0000 mg | ORAL_TABLET | Freq: Three times a day (TID) | ORAL | 0 refills | Status: DC | PRN

## 2017-03-26 MED ORDER — ALPRAZOLAM 0.5 MG PO TABS: 0.5000 mg | ORAL_TABLET | Freq: Three times a day (TID) | ORAL | 0 refills | Status: DC | PRN

## 2017-03-26 MED ORDER — FENTANYL 50 MCG/HR TD PT72: 50.0000 ug | MEDICATED_PATCH | TRANSDERMAL | 0 refills | Status: DC

## 2017-03-26 NOTE — Telephone Encounter (Signed)
Pt is calling in to speak with nurse in regards to her gout. She think that she may be having a flare up. She would like a call back at home # on file.

## 2017-03-26 NOTE — Telephone Encounter (Signed)
OK to refill requested meds

## 2017-03-26 NOTE — Telephone Encounter (Signed)
Pt informed via MyChart that Rx's have been placed at front desk for pick up.  

## 2017-03-26 NOTE — Telephone Encounter (Signed)
Pt is requesting refill on alprazolam 0.5mg , Fentanyl 70mcg/hr, and Oxycodone 15mg .  Last OV: 01/13/2017 Last Fill on alprazolam 0.5mg : 02/26/2017 #90 and 0RF Last Fill on Fentanyl: 02/20/2017 #10 and 0RF Last Fill on Oxycodone: 02/20/2017 #70 and 0RF UDS: 02/26/2017 Moderate risk  Please advise.

## 2017-03-26 NOTE — Telephone Encounter (Signed)
Rx's printed, awaiting MD signature.  

## 2017-03-30 ENCOUNTER — Encounter: Payer: Self-pay | Admitting: Family Medicine

## 2017-03-30 MED ORDER — KETOROLAC TROMETHAMINE 10 MG PO TABS: 10.0000 mg | ORAL_TABLET | Freq: Two times a day (BID) | ORAL | 0 refills | Status: DC | PRN

## 2017-03-30 NOTE — Telephone Encounter (Signed)
Spoke to Dr. Charlett Blake regarding patient's gout flare.  Dr. Charlett Blake advised patient to refill colchicine and 1 refill on Toradol sent to pharmacy.  They are to be taken as before.  Call patient and made her aware.  No additional questions or concerns voiced.

## 2017-03-30 NOTE — Telephone Encounter (Signed)
Patient checking on the status of message below °

## 2017-03-31 NOTE — Telephone Encounter (Signed)
Patient made aware via mychart  PC

## 2017-04-09 ENCOUNTER — Ambulatory Visit: Payer: Self-pay | Admitting: Family Medicine

## 2017-04-09 DIAGNOSIS — Z0289 Encounter for other administrative examinations: Secondary | ICD-10-CM

## 2017-04-14 ENCOUNTER — Ambulatory Visit: Payer: Self-pay | Admitting: Family Medicine

## 2017-04-21 ENCOUNTER — Ambulatory Visit: Payer: Self-pay | Admitting: Family Medicine

## 2017-04-23 ENCOUNTER — Other Ambulatory Visit: Payer: Self-pay | Admitting: Family Medicine

## 2017-04-24 MED ORDER — FENTANYL 50 MCG/HR TD PT72: 50.0000 ug | MEDICATED_PATCH | TRANSDERMAL | 0 refills | Status: DC

## 2017-04-24 MED ORDER — ALPRAZOLAM 0.5 MG PO TABS: ORAL_TABLET | ORAL | 0 refills | Status: DC

## 2017-04-24 MED ORDER — OXYCODONE HCL 15 MG PO TABS: 15.0000 mg | ORAL_TABLET | Freq: Three times a day (TID) | ORAL | 0 refills | Status: DC | PRN

## 2017-04-27 ENCOUNTER — Telehealth: Payer: Self-pay | Admitting: *Deleted

## 2017-04-27 NOTE — Telephone Encounter (Signed)
[facility] requesting 90 day rx for Vitamin D.  Her last vitamin D 7 months ago was 46.  Do you want to continue this?

## 2017-04-27 NOTE — Telephone Encounter (Signed)
She will be seen next week. Can take the daily rx at 2000 or 3000 daily and we will check level at [facility]rich[facility]

## 2017-04-28 NOTE — Telephone Encounter (Signed)
Pharmacy notified.

## 2017-05-05 ENCOUNTER — Encounter: Payer: Self-pay | Admitting: Family Medicine

## 2017-05-05 ENCOUNTER — Ambulatory Visit (INDEPENDENT_AMBULATORY_CARE_PROVIDER_SITE_OTHER): Payer: BLUE CROSS/BLUE SHIELD | Admitting: Family Medicine

## 2017-05-05 VITALS — BP 118/72 | HR 76 | Temp 98.2°F | Ht 65.0 in | Wt 243.2 lb

## 2017-05-05 DIAGNOSIS — F4310 Post-traumatic stress disorder, unspecified: Secondary | ICD-10-CM

## 2017-05-05 DIAGNOSIS — G8929 Other chronic pain: Secondary | ICD-10-CM

## 2017-05-05 DIAGNOSIS — F418 Other specified anxiety disorders: Secondary | ICD-10-CM | POA: Diagnosis not present

## 2017-05-05 DIAGNOSIS — M25561 Pain in right knee: Secondary | ICD-10-CM

## 2017-05-05 DIAGNOSIS — M549 Dorsalgia, unspecified: Secondary | ICD-10-CM | POA: Diagnosis not present

## 2017-05-05 DIAGNOSIS — M1A9XX0 Chronic gout, unspecified, without tophus (tophi): Secondary | ICD-10-CM

## 2017-05-05 DIAGNOSIS — M109 Gout, unspecified: Secondary | ICD-10-CM

## 2017-05-05 DIAGNOSIS — R7309 Other abnormal glucose: Secondary | ICD-10-CM

## 2017-05-05 DIAGNOSIS — E785 Hyperlipidemia, unspecified: Secondary | ICD-10-CM | POA: Diagnosis not present

## 2017-05-05 DIAGNOSIS — I10 Essential (primary) hypertension: Secondary | ICD-10-CM

## 2017-05-05 HISTORY — DX: Gout, unspecified: M10.9

## 2017-05-05 MED ORDER — OXYCODONE HCL 15 MG PO TABS: 15.0000 mg | ORAL_TABLET | Freq: Three times a day (TID) | ORAL | 0 refills | Status: DC | PRN

## 2017-05-05 NOTE — Assessment & Plan Note (Signed)
Encouraged DASH diet, decrease po intake and increase exercise as tolerated. Needs 7-8 hours of sleep nightly. Avoid trans fats, eat small, frequent meals every 4-5 hours with lean proteins, complex carbs and healthy fats. Minimize simple carbs. Referred to bariatric program

## 2017-05-05 NOTE — Assessment & Plan Note (Signed)
Has increased the water intake, added lemon and she has been feeling better since doing that after having to use a course of Ketorolac

## 2017-05-05 NOTE — Assessment & Plan Note (Signed)
Well controlled, no changes to meds. Encouraged heart healthy diet such as the DASH diet and exercise as tolerated.  °

## 2017-05-05 NOTE — Assessment & Plan Note (Signed)
hgba1c acceptable, minimize simple carbs. Increase exercise as tolerated.  

## 2017-05-05 NOTE — Assessment & Plan Note (Signed)
[address] about 11 days ago while leaving a video onto both knees but hit the right > left knee and was struggling with pain and debility but it is now improved after rest and icing. Uses some topical gles.

## 2017-05-05 NOTE — Progress Notes (Signed)
Pre visit review using our clinic review tool, if applicable. No additional management support is needed unless otherwise documented below in the visit note. 

## 2017-05-05 NOTE — Patient Instructions (Signed)
DASH Eating Plan DASH stands for "Dietary Approaches to Stop Hypertension." The DASH eating plan is a healthy eating plan that has been shown to reduce high blood pressure (hypertension). It may also reduce your risk for type 2 diabetes, heart disease, and stroke. The DASH eating plan may also help with weight loss. What are tips for following this plan? General guidelines  Avoid eating more than 2,300 mg (milligrams) of salt (sodium) a day. If you have hypertension, you may need to reduce your sodium intake to 1,500 mg a day.  Limit alcohol intake to no more than 1 drink a day for nonpregnant women and 2 drinks a day for men. One drink equals 12 oz of beer, 5 oz of wine, or 1 oz of hard liquor.  Work with your health care provider to maintain a healthy body weight or to lose weight. Ask what an ideal weight is for you.  Get at least 30 minutes of exercise that causes your heart to beat faster (aerobic exercise) most days of the week. Activities may include walking, swimming, or biking.  Work with your health care provider or diet and nutrition specialist (dietitian) to adjust your eating plan to your individual calorie needs. Reading food labels  Check food labels for the amount of sodium per serving. Choose foods with less than 5 percent of the Daily Value of sodium. Generally, foods with less than 300 mg of sodium per serving fit into this eating plan.  To find whole grains, look for the word "whole" as the first word in the ingredient list. Shopping  Buy products labeled as "low-sodium" or "no salt added."  Buy fresh foods. Avoid canned foods and premade or frozen meals. Cooking  Avoid adding salt when cooking. Use salt-free seasonings or herbs instead of table salt or sea salt. Check with your health care provider or pharmacist before using salt substitutes.  Do not fry foods. Cook foods using healthy methods such as baking, boiling, grilling, and broiling instead.  Cook with  heart-healthy oils, such as olive, canola, soybean, or sunflower oil. Meal planning   Eat a balanced diet that includes: ? 5 or more servings of fruits and vegetables each day. At each meal, try to fill half of your plate with fruits and vegetables. ? Up to 6-8 servings of whole grains each day. ? Less than 6 oz of lean meat, poultry, or fish each day. A 3-oz serving of meat is about the same size as a deck of cards. One egg equals 1 oz. ? 2 servings of low-fat dairy each day. ? A serving of nuts, seeds, or beans 5 times each week. ? Heart-healthy fats. Healthy fats called Omega-3 fatty acids are found in foods such as flaxseeds and coldwater fish, like sardines, salmon, and mackerel.  Limit how much you eat of the following: ? Canned or prepackaged foods. ? Food that is high in trans fat, such as fried foods. ? Food that is high in saturated fat, such as fatty meat. ? Sweets, desserts, sugary drinks, and other foods with added sugar. ? Full-fat dairy products.  Do not salt foods before eating.  Try to eat at least 2 vegetarian meals each week.  Eat more home-cooked food and less restaurant, buffet, and fast food.  When eating at a restaurant, ask that your food be prepared with less salt or no salt, if possible. What foods are recommended? The items listed may not be a complete list. Talk with your dietitian about what   dietary choices are best for you. Grains Whole-grain or whole-wheat bread. Whole-grain or whole-wheat pasta. Brown rice. Oatmeal. Quinoa. Bulgur. Whole-grain and low-sodium cereals. Pita bread. Low-fat, low-sodium crackers. Whole-wheat flour tortillas. Vegetables Fresh or frozen vegetables (raw, steamed, roasted, or grilled). Low-sodium or reduced-sodium tomato and vegetable juice. Low-sodium or reduced-sodium tomato sauce and tomato paste. Low-sodium or reduced-sodium canned vegetables. Fruits All fresh, dried, or frozen fruit. Canned fruit in natural juice (without  added sugar). Meat and other protein foods Skinless chicken or turkey. Ground chicken or turkey. Pork with fat trimmed off. Fish and seafood. Egg whites. Dried beans, peas, or lentils. Unsalted nuts, nut butters, and seeds. Unsalted canned beans. Lean cuts of beef with fat trimmed off. Low-sodium, lean deli meat. Dairy Low-fat (1%) or fat-free (skim) milk. Fat-free, low-fat, or reduced-fat cheeses. Nonfat, low-sodium ricotta or cottage cheese. Low-fat or nonfat yogurt. Low-fat, low-sodium cheese. Fats and oils Soft margarine without trans fats. Vegetable oil. Low-fat, reduced-fat, or light mayonnaise and salad dressings (reduced-sodium). Canola, safflower, olive, soybean, and sunflower oils. Avocado. Seasoning and other foods Herbs. Spices. Seasoning mixes without salt. Unsalted popcorn and pretzels. Fat-free sweets. What foods are not recommended? The items listed may not be a complete list. Talk with your dietitian about what dietary choices are best for you. Grains Baked goods made with fat, such as croissants, muffins, or some breads. Dry pasta or rice meal packs. Vegetables Creamed or fried vegetables. Vegetables in a cheese sauce. Regular canned vegetables (not low-sodium or reduced-sodium). Regular canned tomato sauce and paste (not low-sodium or reduced-sodium). Regular tomato and vegetable juice (not low-sodium or reduced-sodium). Pickles. Olives. Fruits Canned fruit in a light or heavy syrup. Fried fruit. Fruit in cream or butter sauce. Meat and other protein foods Fatty cuts of meat. Ribs. Fried meat. Bacon. Sausage. Bologna and other processed lunch meats. Salami. Fatback. Hotdogs. Bratwurst. Salted nuts and seeds. Canned beans with added salt. Canned or smoked fish. Whole eggs or egg yolks. Chicken or turkey with skin. Dairy Whole or 2% milk, cream, and half-and-half. Whole or full-fat cream cheese. Whole-fat or sweetened yogurt. Full-fat cheese. Nondairy creamers. Whipped toppings.  Processed cheese and cheese spreads. Fats and oils Butter. Stick margarine. Lard. Shortening. Ghee. Bacon fat. Tropical oils, such as coconut, palm kernel, or palm oil. Seasoning and other foods Salted popcorn and pretzels. Onion salt, garlic salt, seasoned salt, table salt, and sea salt. Worcestershire sauce. Tartar sauce. Barbecue sauce. Teriyaki sauce. Soy sauce, including reduced-sodium. Steak sauce. Canned and packaged gravies. Fish sauce. Oyster sauce. Cocktail sauce. Horseradish that you find on the shelf. Ketchup. Mustard. Meat flavorings and tenderizers. Bouillon cubes. Hot sauce and Tabasco sauce. Premade or packaged marinades. Premade or packaged taco seasonings. Relishes. Regular salad dressings. Where to find more information:  National Heart, Lung, and Blood Institute: www.nhlbi.nih.gov  American Heart Association: www.heart.org Summary  The DASH eating plan is a healthy eating plan that has been shown to reduce high blood pressure (hypertension). It may also reduce your risk for type 2 diabetes, heart disease, and stroke.  With the DASH eating plan, you should limit salt (sodium) intake to 2,300 mg a day. If you have hypertension, you may need to reduce your sodium intake to 1,500 mg a day.  When on the DASH eating plan, aim to eat more fresh fruits and vegetables, whole grains, lean proteins, low-fat dairy, and heart-healthy fats.  Work with your health care provider or diet and nutrition specialist (dietitian) to adjust your eating plan to your individual   calorie needs. This information is not intended to replace advice given to you by your health care provider. Make sure you discuss any questions you have with your health care provider. Document Released: 09/04/2011 Document Revised: 09/08/2016 Document Reviewed: 09/08/2016 Elsevier Interactive Patient Education  2017 Elsevier Inc.  

## 2017-05-06 MED ORDER — ATORVASTATIN CALCIUM 10 MG PO TABS: ORAL_TABLET | ORAL | 5 refills | Status: DC

## 2017-05-06 NOTE — Assessment & Plan Note (Signed)
She has chosen to stop her sertraline and is now down to 1/2 tab  QOD. She is aware that she cannot use the Alprazolam alone or in tandem with her pain meds. We will discontinue Alprazolam today and if she decides she wants to consider still taking it she will need a referral to psychiatry

## 2017-05-06 NOTE — Assessment & Plan Note (Signed)
Tolerating statin 3 x a week, encouraged heart healthy diet, avoid trans fats, minimize simple carbs and saturated fats. Increase exercise as tolerated

## 2017-05-06 NOTE — Progress Notes (Signed)
Patient ID: [name] [name], female   DOB: November 21, 1960, 56 y.o.   MRN: [id number]   Subjective:    Patient ID: [name] [name], female    DOB: May 21, 1961, 56 y.o.   MRN: [id number]  Chief Complaint  Patient presents with  . Follow-up    HPI Patient is in today for Follow-up on numerous concerns. She had a recent flare of gout in the right great toe but it responded to Toradol and feels well now. She had a recent fall onto both of her knees while coming out of the video store hurt her right knee worse than her left struggling with ambulation for several days but is feeling much better. She continues to struggle with ongoing back pain she has daily pain despite her current pain medications. She has been cutting back on her sertraline for unclear reasons and is now down to half a tablet every other day simultaneously endorses palpitations and anxiety attacks. Is trying to use alprazolam infrequently as she has been told she cannot continue on that and her opioid medications. No recent febrile illness or hospitalizations. Denies CP/palp/SOB/HA/congestion/fevers/GI or GU c/o. Taking meds as prescribed  Past Medical History:  Diagnosis Date  . Acute foot pain, right 01/13/2017  . Acute upper respiratory infections of unspecified site 06/12/2014  . Anxiety and depression    chronic  . Breast cancer screening 09/05/2016  . Cervical cancer screening 09/05/2016  . Chronic back pain   . Depression with anxiety 08/23/2008   Qualifier: Diagnosis of  By: Niel Hummer MD, Lorinda Creed   . Gout 05/05/2017  . History of fibromyalgia    suspected   . Hypertension   . Insomnia   . Knee pain, right 06/24/2015  . Morbid obesity ([place])   . Ovarian cyst, bilateral   . Preventative health care 06/24/2015  . PTSD (post-traumatic stress disorder)   . Vitamin D deficiency 06/24/2015    Past Surgical History:  Procedure Laterality Date  . OVARIAN CYST REMOVAL      Family History  Problem Relation Age of Onset  . Rheum  arthritis Father   . Diabetes Father   . Congestive Heart Failure Father   . Hypertension Father   . Stroke Father   . Cancer Maternal Uncle 81       colon  . Hip fracture Maternal Grandmother   . COPD Maternal Grandfather 87       colon  . Lymphoma Paternal Grandmother   . ADD / ADHD Neg Hx   . Depression Neg Hx   . Alcohol abuse Neg Hx   . Drug abuse Neg Hx     Social History   Social History  . Marital status: Divorced    Spouse name: N/A  . Number of children: N/A  . Years of education: N/A   Occupational History  . Not on file.   Social History Main Topics  . Smoking status: Former Smoker    Quit date: 04/04/1989  . Smokeless tobacco: Never Used  . Alcohol use Yes     Comment: occassionally- 2 glassses of wine 2x/month  . Drug use: No     Comment: take meds as prescribed  . Sexual activity: Not on file   Other Topics Concern  . Not on file   Social History Narrative  . No narrative on file    Outpatient Medications Prior to Visit  Medication Sig Dispense Refill  . ALPRAZolam (XANAX) 0.5 MG tablet 1 tab po bid with  1/2 tab po q noon prn x 5 days then drop to 1 tab po bid x 5 days then drop to 1/2 tab po tid prn 55 tablet 0  . atorvastatin (LIPITOR) 10 MG tablet TAKE 1 TABLET BY MOUTH THREE TIMES WEEKLY AT BEDTIME 12 tablet 1  . beclomethasone (BECONASE-AQ) 42 MCG/SPRAY nasal spray Place 1 spray into both nostrils 2 (two) times daily. Dose is for each nostril. 25 g 1  . colchicine 0.6 MG tablet 2 tabs po once then 1 tab po q 2 hours prn pain til pain gone, max of 6 tabs in 24 hours or intolerable diarrhea 6 tablet 1  . fentaNYL (DURAGESIC) 50 MCG/HR Place 1 patch (50 mcg total) onto the skin every 3 (three) days. 10 patch 0  . ketorolac (TORADOL) 10 MG tablet Take 1 tablet (10 mg total) by mouth 2 (two) times daily as needed. 6 tablet 0  . lisinopril (PRINIVIL,ZESTRIL) 10 MG tablet TAKE 1 TABLET (10 MG TOTAL) BY MOUTH DAILY. 30 tablet 5  . metoprolol succinate  (TOPROL-XL) 25 MG 24 hr tablet TAKE 1 TABLET (25 MG TOTAL) BY MOUTH DAILY. 30 tablet 5  . promethazine (PHENERGAN) 25 MG tablet Take 1 tablet (25 mg total) by mouth every 6 (six) hours as needed for nausea or vomiting. 20 tablet 0  . sertraline (ZOLOFT) 100 MG tablet TAKE 1 TABLET BY MOUTH EVERY DAY 90 tablet 1  . triamterene-hydrochlorothiazide (MAXZIDE-25) 37.5-25 MG tablet TAKE 1 TABLET BY MOUTH DAILY. 30 tablet 5  . Vitamin D, Ergocalciferol, (DRISDOL) 50000 units CAPS capsule TAKE 1 CAPSULE (50,000 UNITS TOTAL) BY MOUTH EVERY 7 (SEVEN) DAYS. 4 capsule 3  . oxyCODONE (ROXICODONE) 15 MG immediate release tablet Take 1 tablet (15 mg total) by mouth 3 (three) times daily as needed for pain. 60 tablet 0   No facility-administered medications prior to visit.     Allergies  Allergen Reactions  . Cymbalta [Duloxetine Hcl] Other (See Comments)    Headaches  . Fiorinal [Butalbital-Aspirin-Caffeine] Other (See Comments)    Mental Clarity.    Review of Systems  Constitutional: Negative for fever and malaise/fatigue.  HENT: Negative for congestion.   Eyes: Negative for blurred vision.  Respiratory: Negative for shortness of breath.   Cardiovascular: Positive for palpitations. Negative for chest pain and leg swelling.  Gastrointestinal: Negative for abdominal pain, blood in stool and nausea.  Genitourinary: Negative for dysuria and frequency.  Musculoskeletal: Positive for back pain and joint pain. Negative for falls.  Skin: Negative for rash.  Neurological: Negative for dizziness, loss of consciousness and headaches.  Endo/Heme/Allergies: Negative for environmental allergies.  Psychiatric/Behavioral: Positive for depression. The patient is nervous/anxious.        Objective:    Physical Exam  Constitutional: She is oriented to person, place, and time. She appears well-developed and well-nourished. No distress.  HENT:  Head: Normocephalic and atraumatic.  Nose: Nose normal.  Eyes:  Right eye exhibits no discharge. Left eye exhibits no discharge.  Neck: Normal range of motion. Neck supple.  Cardiovascular: Normal rate and regular rhythm.   No murmur heard. Pulmonary/Chest: Effort normal and breath sounds normal.  Abdominal: Soft. Bowel sounds are normal. There is no tenderness.  Musculoskeletal: She exhibits no edema.  Neurological: She is alert and oriented to person, place, and time.  Skin: Skin is warm and dry.  Psychiatric: She has a normal mood and affect.  Nursing note and vitals reviewed.   BP 118/72 (BP Location: Left Arm, Patient Position:  Sitting, Cuff Size: Large)   Pulse 76   Temp 98.2 F (36.8 C) (Oral)   Ht 5\' 5"  (1.651 m)   Wt 243 lb 4 oz (110.3 kg)   SpO2 99%   BMI 40.48 kg/m  Wt Readings from Last 3 Encounters:  05/05/17 243 lb 4 oz (110.3 kg)  01/13/17 246 lb 6.4 oz (111.8 kg)  09/05/16 237 lb 3.2 oz (107.6 kg)     Lab Results  Component Value Date   WBC 7.0 01/13/2017   HGB 12.7 01/13/2017   HCT 38.3 01/13/2017   PLT 169.0 01/13/2017   GLUCOSE 112 (H) 01/13/2017   CHOL 217 (H) 01/13/2017   TRIG 355.0 (H) 01/13/2017   HDL 64.90 01/13/2017   LDLDIRECT 107.0 01/13/2017   LDLCALC 110 (H) 09/05/2016   ALT 15 01/13/2017   AST 19 01/13/2017   NA 139 01/13/2017   K 4.2 01/13/2017   CL 103 01/13/2017   CREATININE 0.57 01/13/2017   BUN 14 01/13/2017   CO2 29 01/13/2017   TSH 2.03 01/13/2017   HGBA1C 5.3 09/05/2016    Lab Results  Component Value Date   TSH 2.03 01/13/2017   Lab Results  Component Value Date   WBC 7.0 01/13/2017   HGB 12.7 01/13/2017   HCT 38.3 01/13/2017   MCV 99.0 01/13/2017   PLT 169.0 01/13/2017   Lab Results  Component Value Date   NA 139 01/13/2017   K 4.2 01/13/2017   CO2 29 01/13/2017   GLUCOSE 112 (H) 01/13/2017   BUN 14 01/13/2017   CREATININE 0.57 01/13/2017   BILITOT 0.4 01/13/2017   ALKPHOS 77 01/13/2017   AST 19 01/13/2017   ALT 15 01/13/2017   PROT 6.7 01/13/2017   ALBUMIN 4.0  01/13/2017   CALCIUM 10.3 01/13/2017   GFR 116.95 01/13/2017   Lab Results  Component Value Date   CHOL 217 (H) 01/13/2017   Lab Results  Component Value Date   HDL 64.90 01/13/2017   Lab Results  Component Value Date   LDLCALC 110 (H) 09/05/2016   Lab Results  Component Value Date   TRIG 355.0 (H) 01/13/2017   Lab Results  Component Value Date   CHOLHDL 3 01/13/2017   Lab Results  Component Value Date   HGBA1C 5.3 09/05/2016       Assessment & Plan:   Problem List Items Addressed This Visit    MORBID OBESITY    Encouraged DASH diet, decrease po intake and increase exercise as tolerated. Needs 7-8 hours of sleep nightly. Avoid trans fats, eat small, frequent meals every 4-5 hours with lean proteins, complex carbs and healthy fats. Minimize simple carbs. Referred to bariatric program      Depression with anxiety    She has chosen to stop her sertraline and is now down to 1/2 tab  QOD. She is aware that she cannot use the Alprazolam alone or in tandem with her pain meds. We will discontinue Alprazolam today and if she decides she wants to consider still taking it she will need a referral to psychiatry       Essential hypertension    Well controlled, no changes to meds. Encouraged heart healthy diet such as the DASH diet and exercise as tolerated.       Backache    Discussed at length the need to minimize opiod use and that if she needs any increase in dosing she will be referred to pain management.       Relevant  Medications   oxyCODONE (ROXICODONE) 15 MG immediate release tablet   HYPERGLYCEMIA    hgba1c acceptable, minimize simple carbs. Increase exercise as tolerated.       PTSD (post-traumatic stress disorder) - Primary    Discussed possibility of referral to psychiatry if symptoms worsen.       Relevant Orders   Ambulatory referral to Psychiatry   Knee pain, right    [address] about 11 days ago while leaving a video onto both knees but hit the right > left  knee and was struggling with pain and debility but it is now improved after rest and icing. Uses some topical gles.       Gout    Has increased the water intake, added lemon and she has been feeling better since doing that after having to use a course of Ketorolac         I have changed Ms. Whyte's oxyCODONE. I am also having her maintain her beclomethasone, triamterene-hydrochlorothiazide, sertraline, metoprolol succinate, lisinopril, colchicine, promethazine, Vitamin D (Ergocalciferol), atorvastatin, ketorolac, fentaNYL, and ALPRAZolam.  Meds ordered this encounter  Medications  . oxyCODONE (ROXICODONE) 15 MG immediate release tablet    Sig: Take 1 tablet (15 mg total) by mouth 3 (three) times daily as needed for pain. Can fill May 25, 2017    Dispense:  70 tablet    Refill:  0      Penni Homans, MD

## 2017-05-06 NOTE — Assessment & Plan Note (Signed)
Discussed at length the need to minimize opiod use and that if she needs any increase in dosing she will be referred to pain management.

## 2017-05-06 NOTE — Assessment & Plan Note (Signed)
Discussed possibility of referral to psychiatry if symptoms worsen.

## 2017-05-18 ENCOUNTER — Other Ambulatory Visit: Payer: Self-pay | Admitting: Family Medicine

## 2017-05-20 ENCOUNTER — Other Ambulatory Visit: Payer: Self-pay | Admitting: Family Medicine

## 2017-05-21 NOTE — Telephone Encounter (Signed)
Rx's for Metoprolol & Lisinopril faxed/thx dmf

## 2017-05-24 ENCOUNTER — Telehealth: Payer: Self-pay | Admitting: Family Medicine

## 2017-05-25 MED ORDER — ALPRAZOLAM 0.5 MG PO TABS: 0.2500 mg | ORAL_TABLET | Freq: Three times a day (TID) | ORAL | 0 refills | Status: DC | PRN

## 2017-05-25 MED ORDER — FENTANYL 50 MCG/HR TD PT72: 50.0000 ug | MEDICATED_PATCH | TRANSDERMAL | 0 refills | Status: DC

## 2017-05-25 NOTE — Telephone Encounter (Signed)
Pt called in to follow up on refill request.    Advised of current status.  

## 2017-05-25 NOTE — Telephone Encounter (Signed)
Pt is requesting refill on Alprazolam and Fentanyl.  Last OV: 05/05/2017 Last Fill on Alprazolam: #55 and 0RF Last Fill on Fentanyl: #10 and 0RF UDS: 02/26/2017 Moderate risk   Please advise.

## 2017-05-25 NOTE — Telephone Encounter (Signed)
Rx's printed, awaiting MD signature.  

## 2017-05-25 NOTE — Telephone Encounter (Signed)
Ok to refill requested meds

## 2017-05-25 NOTE — Telephone Encounter (Signed)
Pt informed via MyChart that Rx's have been placed at front desk for pick up at her convenience.

## 2017-06-10 ENCOUNTER — Telehealth: Payer: Self-pay | Admitting: Family Medicine

## 2017-06-10 ENCOUNTER — Other Ambulatory Visit: Payer: Self-pay | Admitting: Family Medicine

## 2017-06-10 ENCOUNTER — Encounter: Payer: Self-pay | Admitting: Family Medicine

## 2017-06-10 MED ORDER — KETOROLAC TROMETHAMINE 10 MG PO TABS: 10.0000 mg | ORAL_TABLET | Freq: Two times a day (BID) | ORAL | 0 refills | Status: DC | PRN

## 2017-06-10 NOTE — Telephone Encounter (Signed)
°  Relation to VE:LFYB Call back [phone] Pharmacy: [id number] - [place], [address] AT [address] [phone] ([phone] (Fax)   D.O.D Dr Nani Ravens  Reason for call:  Patient experiencing gout flare up requesting ketorolac (TORADOL) 10 MG tablet, please advise

## 2017-06-10 NOTE — Telephone Encounter (Signed)
I sent in Toradol, please let her know

## 2017-06-10 NOTE — Telephone Encounter (Signed)
Pt is aware that refill was sent into pharmacy. Nothing further is needed. She had no additional questions

## 2017-06-10 NOTE — Telephone Encounter (Signed)
Please advise in Dr. Charlett Blake absence  Per note below pt is requesting Toradol Last filled 03/30/17 Qty:6 Rf:0

## 2017-06-10 NOTE — Telephone Encounter (Signed)
OK 

## 2017-06-10 NOTE — Telephone Encounter (Signed)
ketorolac (TORADOL) 10 MG tablet Penni Homans, MD]   Patient Comment: I have GOUT again, believe this is what u ordered Thanks ASAP PLEASE!!!!   Pt requesting refill on Toradol 10mg  for gout??? See above.

## 2017-06-12 NOTE — Telephone Encounter (Signed)
Patient notified    pc

## 2017-06-18 ENCOUNTER — Other Ambulatory Visit: Payer: Self-pay | Admitting: Family Medicine

## 2017-06-19 NOTE — Telephone Encounter (Signed)
SB-Do you want this patient to continue Vit-Tremel Setters? Plz advise/thx dmf

## 2017-06-22 ENCOUNTER — Telehealth: Payer: Self-pay | Admitting: Family Medicine

## 2017-06-22 NOTE — Telephone Encounter (Signed)
Pt says that she is having a  Gout flare up.   Pt would like a refill for ketorolac   Pharmacy: [id number] - [place], [place] - [address] AT [address]

## 2017-06-23 ENCOUNTER — Other Ambulatory Visit: Payer: Self-pay | Admitting: Family Medicine

## 2017-06-25 ENCOUNTER — Other Ambulatory Visit: Payer: Self-pay | Admitting: Family Medicine

## 2017-06-25 MED ORDER — KETOROLAC TROMETHAMINE 10 MG PO TABS: 10.0000 mg | ORAL_TABLET | Freq: Two times a day (BID) | ORAL | 0 refills | Status: DC | PRN

## 2017-06-25 MED ORDER — OXYCODONE HCL 15 MG PO TABS: 15.0000 mg | ORAL_TABLET | Freq: Three times a day (TID) | ORAL | 0 refills | Status: DC | PRN

## 2017-06-25 MED ORDER — COLCHICINE 0.6 MG PO TABS: ORAL_TABLET | ORAL | 1 refills | Status: DC

## 2017-06-25 MED ORDER — FENTANYL 50 MCG/HR TD PT72: 50.0000 ug | MEDICATED_PATCH | TRANSDERMAL | 0 refills | Status: DC

## 2017-06-25 NOTE — Telephone Encounter (Signed)
Error

## 2017-06-25 NOTE — Telephone Encounter (Signed)
Relation to pt: self  Call back number:[phone]   Reason for call:  Patient checking on the status of my chart message sent on 06/23/17,  Patient states refill is due today, please advise  ALPRAZolam (XANAX) 0.5 MG tablet fentaNYL (DURAGESIC) 50 MCG/HR  ketorolac (TORADOL) 10 MG tablet  oxyCODONE (ROXICODONE) 15 MG immediate release tablet

## 2017-06-25 NOTE — Telephone Encounter (Signed)
Pt called in to follow up on refill request. Pt says that she is going to just come on to the office and wait for Rx's .

## 2017-06-25 NOTE — Telephone Encounter (Signed)
Medication colchicine has been sent to her pharmacy for Gout flare up.    PC

## 2017-06-25 NOTE — Telephone Encounter (Signed)
Requesting: Xanax   Fentanyl   Toradol   Oxycodone Contractyes UDSyes gave 10/29/16 moderate risk  Last OV 05/05/17 Last Refill 05/25/17:Xanax:#45-0rf             Fentanyl #10patches -0rf   06/10/17: Toradol #10-0rf 05/05/17: Oxycodone #70-0rf   Please Advise

## 2017-06-26 NOTE — Telephone Encounter (Signed)
Medication sent to patient We have discontinued her xanax   PC

## 2017-07-01 DIAGNOSIS — Z01419 Encounter for gynecological examination (general) (routine) without abnormal findings: Secondary | ICD-10-CM | POA: Diagnosis not present

## 2017-07-01 DIAGNOSIS — Z6841 Body Mass Index (BMI) 40.0 and over, adult: Secondary | ICD-10-CM | POA: Diagnosis not present

## 2017-07-01 DIAGNOSIS — N950 Postmenopausal bleeding: Secondary | ICD-10-CM | POA: Diagnosis not present

## 2017-07-06 ENCOUNTER — Ambulatory Visit (INDEPENDENT_AMBULATORY_CARE_PROVIDER_SITE_OTHER): Payer: BLUE CROSS/BLUE SHIELD | Admitting: Family Medicine

## 2017-07-06 ENCOUNTER — Encounter: Payer: Self-pay | Admitting: Family Medicine

## 2017-07-06 VITALS — BP 122/72 | HR 78 | Temp 98.3°F | Resp 18 | Wt 234.0 lb

## 2017-07-06 DIAGNOSIS — E559 Vitamin D deficiency, unspecified: Secondary | ICD-10-CM

## 2017-07-06 DIAGNOSIS — R7309 Other abnormal glucose: Secondary | ICD-10-CM

## 2017-07-06 DIAGNOSIS — I10 Essential (primary) hypertension: Secondary | ICD-10-CM | POA: Diagnosis not present

## 2017-07-06 DIAGNOSIS — F418 Other specified anxiety disorders: Secondary | ICD-10-CM

## 2017-07-06 DIAGNOSIS — E785 Hyperlipidemia, unspecified: Secondary | ICD-10-CM

## 2017-07-06 DIAGNOSIS — M1A9XX0 Chronic gout, unspecified, without tophus (tophi): Secondary | ICD-10-CM

## 2017-07-06 DIAGNOSIS — M549 Dorsalgia, unspecified: Secondary | ICD-10-CM | POA: Diagnosis not present

## 2017-07-06 DIAGNOSIS — Z23 Encounter for immunization: Secondary | ICD-10-CM

## 2017-07-06 MED ORDER — KETOROLAC TROMETHAMINE 10 MG PO TABS: 10.0000 mg | ORAL_TABLET | Freq: Two times a day (BID) | ORAL | 0 refills | Status: DC | PRN

## 2017-07-06 NOTE — Assessment & Plan Note (Signed)
Pain is manageable with current meds. No refills needed today

## 2017-07-06 NOTE — Progress Notes (Signed)
Subjective:  I acted as a Education administrator for Dr. Charlett Blake. [place], [place]  Patient ID: [name] [name], female    DOB: 1961/03/14, 56 y.o.   MRN: [id number]  No chief complaint on file.   HPI  Patient is in today for a 2 month follow up. She is doing well today. She continues to struggle with back pain but it is manageable with current meds no changes needed. She has managed to stop her Alprazolam most days. No flare in anxiety or depression. No recent febrile illness or hospitalizations. Denies CP/palp/SOB/HA/congestion/fevers/GI or GU c/o. Taking meds as prescribed  Patient Care Team: Mosie Lukes, MD as PCP - General (Family Medicine)   Past Medical History:  Diagnosis Date  . Acute foot pain, right 01/13/2017  . Acute upper respiratory infections of unspecified site 06/12/2014  . Anxiety and depression    chronic  . Breast cancer screening 09/05/2016  . Cervical cancer screening 09/05/2016  . Chronic back pain   . Depression with anxiety 08/23/2008   Qualifier: Diagnosis of  By: Niel Hummer MD, Lorinda Creed   . Gout 05/05/2017  . History of fibromyalgia    suspected   . Hypertension   . Insomnia   . Knee pain, right 06/24/2015  . Morbid obesity ([place])   . Ovarian cyst, bilateral   . Preventative health care 06/24/2015  . PTSD (post-traumatic stress disorder)   . Vitamin D deficiency 06/24/2015    Past Surgical History:  Procedure Laterality Date  . OVARIAN CYST REMOVAL      Family History  Problem Relation Age of Onset  . Rheum arthritis Father   . Diabetes Father   . Congestive Heart Failure Father   . Hypertension Father   . Stroke Father   . Cancer Maternal Uncle 42       colon  . Hip fracture Maternal Grandmother   . COPD Maternal Grandfather 87       colon  . Lymphoma Paternal Grandmother   . ADD / ADHD Neg Hx   . Depression Neg Hx   . Alcohol abuse Neg Hx   . Drug abuse Neg Hx     Social History   Social History  . Marital status: Divorced    Spouse name: N/A  .  Number of children: N/A  . Years of education: N/A   Occupational History  . Not on file.   Social History Main Topics  . Smoking status: Former Smoker    Quit date: 04/04/1989  . Smokeless tobacco: Never Used  . Alcohol use Yes     Comment: occassionally- 2 glassses of wine 2x/month  . Drug use: No     Comment: take meds as prescribed  . Sexual activity: Not on file   Other Topics Concern  . Not on file   Social History Narrative  . No narrative on file    Outpatient Medications Prior to Visit  Medication Sig Dispense Refill  . atorvastatin (LIPITOR) 10 MG tablet TAKE 1 TABLET BY MOUTH THREE TIMES WEEKLY AT BEDTIME 15 tablet 5  . beclomethasone (BECONASE-AQ) 42 MCG/SPRAY nasal spray Place 1 spray into both nostrils 2 (two) times daily. Dose is for each nostril. 25 g 1  . colchicine 0.6 MG tablet 2 tabs po once then 1 tab po q 2 hours prn pain til pain gone, max of 6 tabs in 24 hours or intolerable diarrhea 6 tablet 1  . fentaNYL (DURAGESIC) 50 MCG/HR Place 1 patch (50 mcg total)  onto the skin every 3 (three) days. 10 patch 0  . lisinopril (PRINIVIL,ZESTRIL) 10 MG tablet TAKE 1 TABLET (10 MG TOTAL) BY MOUTH DAILY. 30 tablet 5  . metoprolol succinate (TOPROL-XL) 25 MG 24 hr tablet TAKE 1 TABLET (25 MG TOTAL) BY MOUTH DAILY. 30 tablet 5  . oxyCODONE (ROXICODONE) 15 MG immediate release tablet Take 1 tablet (15 mg total) by mouth 3 (three) times daily as needed for pain. Can fill May 25, 2017 70 tablet 0  . promethazine (PHENERGAN) 25 MG tablet Take 1 tablet (25 mg total) by mouth every 6 (six) hours as needed for nausea or vomiting. 20 tablet 0  . triamterene-hydrochlorothiazide (MAXZIDE-25) 37.5-25 MG tablet TAKE 1 TABLET BY MOUTH DAILY. 30 tablet 5  . Vitamin D, Ergocalciferol, (DRISDOL) 50000 units CAPS capsule TAKE 1 CAPSULE (50,000 UNITS TOTAL) BY MOUTH EVERY 7 (SEVEN) DAYS. 4 capsule 3  . ALPRAZolam (XANAX) 0.5 MG tablet Take 0.5 tablets (0.25 mg total) by mouth 3 (three)  times daily as needed for anxiety. 45 tablet 0  . ketorolac (TORADOL) 10 MG tablet Take 1 tablet (10 mg total) by mouth 2 (two) times daily as needed. 10 tablet 0  . sertraline (ZOLOFT) 100 MG tablet TAKE 1 TABLET BY MOUTH EVERY DAY 90 tablet 0   No facility-administered medications prior to visit.     Allergies  Allergen Reactions  . Cymbalta [Duloxetine Hcl] Other (See Comments)    Headaches  . Fiorinal [Butalbital-Aspirin-Caffeine] Other (See Comments)    Mental Clarity.    Review of Systems  Constitutional: Negative for fever and malaise/fatigue.  HENT: Negative for congestion.   Eyes: Negative for blurred vision.  Respiratory: Negative for shortness of breath.   Cardiovascular: Negative for chest pain, palpitations and leg swelling.  Gastrointestinal: Negative for abdominal pain, blood in stool and nausea.  Genitourinary: Negative for dysuria and frequency.  Musculoskeletal: Positive for back pain. Negative for falls.  Skin: Negative for rash.  Neurological: Negative for dizziness, loss of consciousness and headaches.  Endo/Heme/Allergies: Negative for environmental allergies.  Psychiatric/Behavioral: Negative for depression. The patient is nervous/anxious.        Objective:    Physical Exam  Constitutional: She is oriented to person, place, and time. She appears well-developed and well-nourished. No distress.  HENT:  Head: Normocephalic and atraumatic.  Nose: Nose normal.  Eyes: Right eye exhibits no discharge. Left eye exhibits no discharge.  Neck: Normal range of motion. Neck supple.  Cardiovascular: Normal rate and regular rhythm.   No murmur heard. Pulmonary/Chest: Effort normal and breath sounds normal.  Abdominal: Soft. Bowel sounds are normal. There is no tenderness.  Musculoskeletal: She exhibits no edema.  Neurological: She is alert and oriented to person, place, and time.  Skin: Skin is warm and dry.  Psychiatric: She has a normal mood and affect.    Nursing note and vitals reviewed.   BP 122/72 (BP Location: Left Arm, Patient Position: Sitting, Cuff Size: Normal)   Pulse 78   Temp 98.3 F (36.8 C) (Oral)   Resp 18   Wt 234 lb (106.1 kg)   SpO2 96%   BMI 38.94 kg/m  Wt Readings from Last 3 Encounters:  07/06/17 234 lb (106.1 kg)  05/05/17 243 lb 4 oz (110.3 kg)  01/13/17 246 lb 6.4 oz (111.8 kg)   BP Readings from Last 3 Encounters:  07/06/17 122/72  05/05/17 118/72  01/13/17 130/72     Immunization History  Administered Date(s) Administered  . Influenza Split  08/08/2011  . Influenza Whole 08/22/2009, 07/01/2010  . Influenza,inj,Quad PF,6+ Mos 07/06/2017  . Td 01/09/2009  . Zoster 09/27/2015    Health Maintenance  Topic Date Due  . HIV Screening  10/24/1976  . PAP SMEAR  04/30/2013  . MAMMOGRAM  01/28/2016  . INFLUENZA VACCINE  09/05/2017 (Originally 04/29/2017)  . TETANUS/TDAP  01/10/2019  . COLONOSCOPY  05/03/2022  . Hepatitis C Screening  Completed    Lab Results  Component Value Date   WBC 7.0 01/13/2017   HGB 12.7 01/13/2017   HCT 38.3 01/13/2017   PLT 169.0 01/13/2017   GLUCOSE 112 (H) 01/13/2017   CHOL 217 (H) 01/13/2017   TRIG 355.0 (H) 01/13/2017   HDL 64.90 01/13/2017   LDLDIRECT 107.0 01/13/2017   LDLCALC 110 (H) 09/05/2016   ALT 15 01/13/2017   AST 19 01/13/2017   NA 139 01/13/2017   K 4.2 01/13/2017   CL 103 01/13/2017   CREATININE 0.57 01/13/2017   BUN 14 01/13/2017   CO2 29 01/13/2017   TSH 2.03 01/13/2017   HGBA1C 5.3 09/05/2016    Lab Results  Component Value Date   TSH 2.03 01/13/2017   Lab Results  Component Value Date   WBC 7.0 01/13/2017   HGB 12.7 01/13/2017   HCT 38.3 01/13/2017   MCV 99.0 01/13/2017   PLT 169.0 01/13/2017   Lab Results  Component Value Date   NA 139 01/13/2017   K 4.2 01/13/2017   CO2 29 01/13/2017   GLUCOSE 112 (H) 01/13/2017   BUN 14 01/13/2017   CREATININE 0.57 01/13/2017   BILITOT 0.4 01/13/2017   ALKPHOS 77 01/13/2017   AST 19  01/13/2017   ALT 15 01/13/2017   PROT 6.7 01/13/2017   ALBUMIN 4.0 01/13/2017   CALCIUM 10.3 01/13/2017   GFR 116.95 01/13/2017   Lab Results  Component Value Date   CHOL 217 (H) 01/13/2017   Lab Results  Component Value Date   HDL 64.90 01/13/2017   Lab Results  Component Value Date   LDLCALC 110 (H) 09/05/2016   Lab Results  Component Value Date   TRIG 355.0 (H) 01/13/2017   Lab Results  Component Value Date   CHOLHDL 3 01/13/2017   Lab Results  Component Value Date   HGBA1C 5.3 09/05/2016         Assessment & Plan:   Problem List Items Addressed This Visit    Hyperlipidemia    Encouraged heart healthy diet, increase exercise, avoid trans fats, consider a krill oil cap daily      Relevant Orders   Lipid panel   Depression with anxiety    Has been able to stop her Alprazolam and while she is struggling with stress she feels she is managing well      Essential hypertension    Well controlled, no changes to meds. Encouraged heart healthy diet such as the DASH diet and exercise as tolerated.       Relevant Orders   CBC   Comprehensive metabolic panel   Comprehensive metabolic panel   HYPERGLYCEMIA    hgba1c acceptable, minimize simple carbs. Increase exercise as tolerated      Relevant Orders   Hemoglobin A1c   Vitamin D deficiency    Check level today      Relevant Orders   VITAMIN D 25 Hydroxy (Vit-D Deficiency, Fractures)   Gout    Check uric acid level. Relays h/o 4 flares in past 6 motnhs      Relevant  Orders   Uric acid    Other Visit Diagnoses    Needs flu shot    -  Primary   Relevant Orders   Flu Vaccine QUAD 36+ mos IM (Fluarix & Fluzone Quad PF (Completed)      I have discontinued Ms. Gignac's sertraline and ALPRAZolam. I am also having her maintain her beclomethasone, triamterene-hydrochlorothiazide, promethazine, atorvastatin, metoprolol succinate, lisinopril, Vitamin D (Ergocalciferol), oxyCODONE, fentaNYL, colchicine, and  ketorolac.  Meds ordered this encounter  Medications  . ketorolac (TORADOL) 10 MG tablet    Sig: Take 1 tablet (10 mg total) by mouth 2 (two) times daily as needed.    Dispense:  10 tablet    Refill:  0    CMA served as scribe during this visit. History, Physical and Plan performed by medical provider. Documentation and orders reviewed and attested to.  Penni Homans, MD

## 2017-07-06 NOTE — Assessment & Plan Note (Signed)
Check level today 

## 2017-07-06 NOTE — Assessment & Plan Note (Signed)
Has been able to stop her Alprazolam and while she is struggling with stress she feels she is managing well

## 2017-07-06 NOTE — Assessment & Plan Note (Signed)
Well controlled, no changes to meds. Encouraged heart healthy diet such as the DASH diet and exercise as tolerated.  °

## 2017-07-06 NOTE — Assessment & Plan Note (Signed)
hgba1c acceptable, minimize simple carbs. Increase exercise as tolerated.  

## 2017-07-06 NOTE — Assessment & Plan Note (Signed)
Check uric acid level. Relays h/o 4 flares in past 6 motnhs

## 2017-07-06 NOTE — Patient Instructions (Signed)
DASH Eating Plan DASH stands for "Dietary Approaches to Stop Hypertension." The DASH eating plan is a healthy eating plan that has been shown to reduce high blood pressure (hypertension). It may also reduce your risk for type 2 diabetes, heart disease, and stroke. The DASH eating plan may also help with weight loss. What are tips for following this plan? General guidelines  Avoid eating more than 2,300 mg (milligrams) of salt (sodium) a day. If you have hypertension, you may need to reduce your sodium intake to 1,500 mg a day.  Limit alcohol intake to no more than 1 drink a day for nonpregnant women and 2 drinks a day for men. One drink equals 12 oz of beer, 5 oz of wine, or 1 oz of hard liquor.  Work with your health care provider to maintain a healthy body weight or to lose weight. Ask what an ideal weight is for you.  Get at least 30 minutes of exercise that causes your heart to beat faster (aerobic exercise) most days of the week. Activities may include walking, swimming, or biking.  Work with your health care provider or diet and nutrition specialist (dietitian) to adjust your eating plan to your individual calorie needs. Reading food labels  Check food labels for the amount of sodium per serving. Choose foods with less than 5 percent of the Daily Value of sodium. Generally, foods with less than 300 mg of sodium per serving fit into this eating plan.  To find whole grains, look for the word "whole" as the first word in the ingredient list. Shopping  Buy products labeled as "low-sodium" or "no salt added."  Buy fresh foods. Avoid canned foods and premade or frozen meals. Cooking  Avoid adding salt when cooking. Use salt-free seasonings or herbs instead of table salt or sea salt. Check with your health care provider or pharmacist before using salt substitutes.  Do not fry foods. Cook foods using healthy methods such as baking, boiling, grilling, and broiling instead.  Cook with  heart-healthy oils, such as olive, canola, soybean, or sunflower oil. Meal planning   Eat a balanced diet that includes: ? 5 or more servings of fruits and vegetables each day. At each meal, try to fill half of your plate with fruits and vegetables. ? Up to 6-8 servings of whole grains each day. ? Less than 6 oz of lean meat, poultry, or fish each day. A 3-oz serving of meat is about the same size as a deck of cards. One egg equals 1 oz. ? 2 servings of low-fat dairy each day. ? A serving of nuts, seeds, or beans 5 times each week. ? Heart-healthy fats. Healthy fats called Omega-3 fatty acids are found in foods such as flaxseeds and coldwater fish, like sardines, salmon, and mackerel.  Limit how much you eat of the following: ? Canned or prepackaged foods. ? Food that is high in trans fat, such as fried foods. ? Food that is high in saturated fat, such as fatty meat. ? Sweets, desserts, sugary drinks, and other foods with added sugar. ? Full-fat dairy products.  Do not salt foods before eating.  Try to eat at least 2 vegetarian meals each week.  Eat more home-cooked food and less restaurant, buffet, and fast food.  When eating at a restaurant, ask that your food be prepared with less salt or no salt, if possible. What foods are recommended? The items listed may not be a complete list. Talk with your dietitian about what   dietary choices are best for you. Grains Whole-grain or whole-wheat bread. Whole-grain or whole-wheat pasta. Brown rice. Oatmeal. Quinoa. Bulgur. Whole-grain and low-sodium cereals. Pita bread. Low-fat, low-sodium crackers. Whole-wheat flour tortillas. Vegetables Fresh or frozen vegetables (raw, steamed, roasted, or grilled). Low-sodium or reduced-sodium tomato and vegetable juice. Low-sodium or reduced-sodium tomato sauce and tomato paste. Low-sodium or reduced-sodium canned vegetables. Fruits All fresh, dried, or frozen fruit. Canned fruit in natural juice (without  added sugar). Meat and other protein foods Skinless chicken or turkey. Ground chicken or turkey. Pork with fat trimmed off. Fish and seafood. Egg whites. Dried beans, peas, or lentils. Unsalted nuts, nut butters, and seeds. Unsalted canned beans. Lean cuts of beef with fat trimmed off. Low-sodium, lean deli meat. Dairy Low-fat (1%) or fat-free (skim) milk. Fat-free, low-fat, or reduced-fat cheeses. Nonfat, low-sodium ricotta or cottage cheese. Low-fat or nonfat yogurt. Low-fat, low-sodium cheese. Fats and oils Soft margarine without trans fats. Vegetable oil. Low-fat, reduced-fat, or light mayonnaise and salad dressings (reduced-sodium). Canola, safflower, olive, soybean, and sunflower oils. Avocado. Seasoning and other foods Herbs. Spices. Seasoning mixes without salt. Unsalted popcorn and pretzels. Fat-free sweets. What foods are not recommended? The items listed may not be a complete list. Talk with your dietitian about what dietary choices are best for you. Grains Baked goods made with fat, such as croissants, muffins, or some breads. Dry pasta or rice meal packs. Vegetables Creamed or fried vegetables. Vegetables in a cheese sauce. Regular canned vegetables (not low-sodium or reduced-sodium). Regular canned tomato sauce and paste (not low-sodium or reduced-sodium). Regular tomato and vegetable juice (not low-sodium or reduced-sodium). Pickles. Olives. Fruits Canned fruit in a light or heavy syrup. Fried fruit. Fruit in cream or butter sauce. Meat and other protein foods Fatty cuts of meat. Ribs. Fried meat. Bacon. Sausage. Bologna and other processed lunch meats. Salami. Fatback. Hotdogs. Bratwurst. Salted nuts and seeds. Canned beans with added salt. Canned or smoked fish. Whole eggs or egg yolks. Chicken or turkey with skin. Dairy Whole or 2% milk, cream, and half-and-half. Whole or full-fat cream cheese. Whole-fat or sweetened yogurt. Full-fat cheese. Nondairy creamers. Whipped toppings.  Processed cheese and cheese spreads. Fats and oils Butter. Stick margarine. Lard. Shortening. Ghee. Bacon fat. Tropical oils, such as coconut, palm kernel, or palm oil. Seasoning and other foods Salted popcorn and pretzels. Onion salt, garlic salt, seasoned salt, table salt, and sea salt. Worcestershire sauce. Tartar sauce. Barbecue sauce. Teriyaki sauce. Soy sauce, including reduced-sodium. Steak sauce. Canned and packaged gravies. Fish sauce. Oyster sauce. Cocktail sauce. Horseradish that you find on the shelf. Ketchup. Mustard. Meat flavorings and tenderizers. Bouillon cubes. Hot sauce and Tabasco sauce. Premade or packaged marinades. Premade or packaged taco seasonings. Relishes. Regular salad dressings. Where to find more information:  National Heart, Lung, and Blood Institute: www.nhlbi.nih.gov  American Heart Association: www.heart.org Summary  The DASH eating plan is a healthy eating plan that has been shown to reduce high blood pressure (hypertension). It may also reduce your risk for type 2 diabetes, heart disease, and stroke.  With the DASH eating plan, you should limit salt (sodium) intake to 2,300 mg a day. If you have hypertension, you may need to reduce your sodium intake to 1,500 mg a day.  When on the DASH eating plan, aim to eat more fresh fruits and vegetables, whole grains, lean proteins, low-fat dairy, and heart-healthy fats.  Work with your health care provider or diet and nutrition specialist (dietitian) to adjust your eating plan to your individual   calorie needs. This information is not intended to replace advice given to you by your health care provider. Make sure you discuss any questions you have with your health care provider. Document Released: 09/04/2011 Document Revised: 09/08/2016 Document Reviewed: 09/08/2016 Elsevier Interactive Patient Education  2017 Elsevier Inc.  

## 2017-07-06 NOTE — Assessment & Plan Note (Signed)
Encouraged heart healthy diet, increase exercise, avoid trans fats, consider a krill oil cap daily 

## 2017-07-07 LAB — CBC
HEMATOCRIT: 46.3 % — AB (ref 36.0–46.0)
Hemoglobin: 15.1 g/dL — ABNORMAL HIGH (ref 12.0–15.0)
MCHC: 32.6 g/dL (ref 30.0–36.0)
MCV: 98.2 fl (ref 78.0–100.0)
Platelets: 206 10*3/uL (ref 150.0–400.0)
RBC: 4.71 Mil/uL (ref 3.87–5.11)
RDW: 14.1 % (ref 11.5–15.5)
WBC: 7.1 10*3/uL (ref 4.0–10.5)

## 2017-07-07 LAB — LIPID PANEL
Cholesterol: 206 mg/dL — ABNORMAL HIGH (ref 0–200)
HDL: 85.2 mg/dL (ref >39.00)
LDL CALC: 105 mg/dL — AB (ref 0–99)
NONHDL: 120.92
Total CHOL/HDL Ratio: 2
Triglycerides: 81 mg/dL (ref 0.0–149.0)
VLDL: 16.2 mg/dL (ref 0.0–40.0)

## 2017-07-07 LAB — COMPREHENSIVE METABOLIC PANEL
ALK PHOS: 61 U/L (ref 39–117)
ALT: 140 U/L — ABNORMAL HIGH (ref 0–35)
AST: 112 U/L — ABNORMAL HIGH (ref 0–37)
Albumin: 4.6 g/dL (ref 3.5–5.2)
BUN: 13 mg/dL (ref 6–23)
CO2: 32 mEq/L (ref 19–32)
Calcium: 11.1 mg/dL — ABNORMAL HIGH (ref 8.4–10.5)
Chloride: 97 mEq/L (ref 96–112)
Creatinine, Ser: 0.48 mg/dL (ref 0.40–1.20)
GFR: 142.35 mL/min (ref >60.00)
GLUCOSE: 94 mg/dL (ref 70–99)
POTASSIUM: 5.4 meq/L — AB (ref 3.5–5.1)
SODIUM: 138 meq/L (ref 135–145)
TOTAL PROTEIN: 7.5 g/dL (ref 6.0–8.3)
Total Bilirubin: 0.6 mg/dL (ref 0.2–1.2)

## 2017-07-07 LAB — URIC ACID: Uric Acid, Serum: 9.5 mg/dL — ABNORMAL HIGH (ref 2.4–7.0)

## 2017-07-07 LAB — VITAMIN D 25 HYDROXY (VIT D DEFICIENCY, FRACTURES): VITD: 53.28 ng/mL (ref 30.00–100.00)

## 2017-07-07 LAB — HEMOGLOBIN A1C: HEMOGLOBIN A1C: 5.7 % (ref 4.6–6.5)

## 2017-07-09 NOTE — Addendum Note (Signed)
Addended by: Magdalene Molly A on: 07/09/2017 11:01 AM   Modules accepted: Orders

## 2017-07-13 ENCOUNTER — Other Ambulatory Visit: Payer: Self-pay | Admitting: Family Medicine

## 2017-07-15 NOTE — Telephone Encounter (Signed)
On 8.8.18 6 months worth was faxed/thx dmf

## 2017-07-16 ENCOUNTER — Encounter: Payer: Self-pay | Admitting: Family Medicine

## 2017-07-22 DIAGNOSIS — Z803 Family history of malignant neoplasm of breast: Secondary | ICD-10-CM | POA: Diagnosis not present

## 2017-07-22 DIAGNOSIS — Z8052 Family history of malignant neoplasm of bladder: Secondary | ICD-10-CM | POA: Diagnosis not present

## 2017-07-22 DIAGNOSIS — Z800 Family history of malignant neoplasm of digestive organs: Secondary | ICD-10-CM | POA: Diagnosis not present

## 2017-07-22 DIAGNOSIS — N950 Postmenopausal bleeding: Secondary | ICD-10-CM | POA: Diagnosis not present

## 2017-07-23 ENCOUNTER — Telehealth: Payer: Self-pay | Admitting: Family Medicine

## 2017-07-23 ENCOUNTER — Encounter: Payer: Self-pay | Admitting: Family Medicine

## 2017-07-24 ENCOUNTER — Other Ambulatory Visit: Payer: Self-pay

## 2017-07-24 ENCOUNTER — Other Ambulatory Visit: Payer: Self-pay | Admitting: Family Medicine

## 2017-07-24 MED ORDER — ATORVASTATIN CALCIUM 10 MG PO TABS: ORAL_TABLET | ORAL | 5 refills | Status: DC

## 2017-07-24 MED ORDER — FENTANYL 50 MCG/HR TD PT72: 50.0000 ug | MEDICATED_PATCH | TRANSDERMAL | 0 refills | Status: DC

## 2017-07-24 MED ORDER — OXYCODONE HCL 15 MG PO TABS: 15.0000 mg | ORAL_TABLET | Freq: Three times a day (TID) | ORAL | 0 refills | Status: DC | PRN

## 2017-07-24 NOTE — Telephone Encounter (Signed)
I have refilled please let patient know

## 2017-07-24 NOTE — Telephone Encounter (Signed)
Called the patient informed hardcopy of both oxycodone/and fentanyl are ready for pickup at the front desk. She verbalized understanding.

## 2017-07-24 NOTE — Telephone Encounter (Signed)
Pt is requesting refill on Oxycodone 15mg  and Fentanyl 1mcg/hr.   Last OV: 07/06/2017 Last Fill on Oxycodone: 06/25/2017 #70 and 0RF Last Fill on Fentanyl: 06/25/2017 #10 and 0RF UDS: 02/26/2017 Moderate risk  Please advise.

## 2017-07-31 ENCOUNTER — Other Ambulatory Visit: Payer: Self-pay

## 2017-07-31 MED ORDER — BUPROPION HCL ER (XL) 150 MG PO TB24: 150.0000 mg | ORAL_TABLET | Freq: Every day | ORAL | 0 refills | Status: DC

## 2017-07-31 MED ORDER — BUPROPION HCL ER (XL) 300 MG PO TB24: 300.0000 mg | ORAL_TABLET | Freq: Every day | ORAL | 1 refills | Status: DC

## 2017-07-31 NOTE — Telephone Encounter (Signed)
Per phone note on 10/18 she was advised on Wellbutrin  I have rx her Wellbutrin for her

## 2017-08-03 ENCOUNTER — Encounter: Payer: Self-pay | Admitting: Family Medicine

## 2017-08-03 ENCOUNTER — Other Ambulatory Visit (INDEPENDENT_AMBULATORY_CARE_PROVIDER_SITE_OTHER): Payer: BLUE CROSS/BLUE SHIELD

## 2017-08-03 DIAGNOSIS — R7309 Other abnormal glucose: Secondary | ICD-10-CM

## 2017-08-03 DIAGNOSIS — M1A9XX0 Chronic gout, unspecified, without tophus (tophi): Secondary | ICD-10-CM | POA: Diagnosis not present

## 2017-08-03 DIAGNOSIS — I10 Essential (primary) hypertension: Secondary | ICD-10-CM

## 2017-08-03 LAB — COMPREHENSIVE METABOLIC PANEL
ALBUMIN: 4.6 g/dL (ref 3.5–5.2)
ALT: 46 U/L — AB (ref 0–35)
AST: 29 U/L (ref 0–37)
Alkaline Phosphatase: 58 U/L (ref 39–117)
BILIRUBIN TOTAL: 0.7 mg/dL (ref 0.2–1.2)
BUN: 13 mg/dL (ref 6–23)
CALCIUM: 11.1 mg/dL — AB (ref 8.4–10.5)
CO2: 29 mEq/L (ref 19–32)
CREATININE: 0.56 mg/dL (ref 0.40–1.20)
Chloride: 100 mEq/L (ref 96–112)
GFR: 119.12 mL/min (ref >60.00)
Glucose, Bld: 120 mg/dL — ABNORMAL HIGH (ref 70–99)
Potassium: 3.6 mEq/L (ref 3.5–5.1)
Sodium: 137 mEq/L (ref 135–145)
Total Protein: 7.4 g/dL (ref 6.0–8.3)

## 2017-08-03 NOTE — Telephone Encounter (Signed)
Patient was calling in regards to her email she submit to Dr. Charlett Blake. Adv patient to allow 24-48/ hours for a respond.

## 2017-08-04 LAB — HEPATITIS PANEL, ACUTE
HEP B C IGM: NONREACTIVE
HEP B S AG: NONREACTIVE
HEP C AB: NONREACTIVE
Hep A IgM: NONREACTIVE
SIGNAL TO CUT-OFF: 0.01 (ref <1.00)

## 2017-08-06 ENCOUNTER — Ambulatory Visit: Payer: BLUE CROSS/BLUE SHIELD | Admitting: Family Medicine

## 2017-08-06 DIAGNOSIS — E785 Hyperlipidemia, unspecified: Secondary | ICD-10-CM | POA: Diagnosis not present

## 2017-08-06 DIAGNOSIS — F418 Other specified anxiety disorders: Secondary | ICD-10-CM | POA: Diagnosis not present

## 2017-08-06 DIAGNOSIS — R7309 Other abnormal glucose: Secondary | ICD-10-CM | POA: Diagnosis not present

## 2017-08-06 DIAGNOSIS — I10 Essential (primary) hypertension: Secondary | ICD-10-CM | POA: Diagnosis not present

## 2017-08-06 DIAGNOSIS — M549 Dorsalgia, unspecified: Secondary | ICD-10-CM | POA: Diagnosis not present

## 2017-08-06 MED ORDER — FENTANYL 50 MCG/HR TD PT72: 50.0000 ug | MEDICATED_PATCH | TRANSDERMAL | 0 refills | Status: DC

## 2017-08-06 MED ORDER — FLUOXETINE HCL 10 MG PO TABS: 10.0000 mg | ORAL_TABLET | Freq: Every day | ORAL | 1 refills | Status: DC

## 2017-08-06 MED ORDER — OXYCODONE HCL 15 MG PO TABS: 15.0000 mg | ORAL_TABLET | Freq: Three times a day (TID) | ORAL | 0 refills | Status: DC | PRN

## 2017-08-06 NOTE — Progress Notes (Signed)
Subjective:  I acted as a Education administrator for Dr. Charlett Blake. [place], [place]  Patient ID: [name] [name], female    DOB: 1961/05/08, 56 y.o.   MRN: [id number]  No chief complaint on file.   HPI  Patient is in today for a medication follow up and notes she had to stop the bupropion secondary to irritability and anxiety. She still feelss he needs something. No recent febrile illness or hospitlaizations. Continues to have daily pain with backa nd joings both bothering her.  No falls or new trauma. Denies CP/palp/SOB/HA/congestion/fevers/GI or GU c/o. Taking meds as prescribed  Patient Care Team: Mosie Lukes, MD as PCP - General (Family Medicine)   Past Medical History:  Diagnosis Date  . Acute foot pain, right 01/13/2017  . Acute upper respiratory infections of unspecified site 06/12/2014  . Anxiety and depression    chronic  . Breast cancer screening 09/05/2016  . Cervical cancer screening 09/05/2016  . Chronic back pain   . Depression with anxiety 08/23/2008   Qualifier: Diagnosis of  By: Niel Hummer MD, Lorinda Creed   . Gout 05/05/2017  . History of fibromyalgia    suspected   . Hypertension   . Insomnia   . Knee pain, right 06/24/2015  . Morbid obesity ([place])   . Ovarian cyst, bilateral   . Preventative health care 06/24/2015  . PTSD (post-traumatic stress disorder)   . Vitamin D deficiency 06/24/2015    Past Surgical History:  Procedure Laterality Date  . OVARIAN CYST REMOVAL      Family History  Problem Relation Age of Onset  . Rheum arthritis Father   . Diabetes Father   . Congestive Heart Failure Father   . Hypertension Father   . Stroke Father   . Cancer Maternal Uncle 7       colon  . Hip fracture Maternal Grandmother   . COPD Maternal Grandfather 87       colon  . Lymphoma Paternal Grandmother   . ADD / ADHD Neg Hx   . Depression Neg Hx   . Alcohol abuse Neg Hx   . Drug abuse Neg Hx     Social History   Socioeconomic History  . Marital status: Divorced    Spouse  name: Not on file  . Number of children: Not on file  . Years of education: Not on file  . Highest education level: Not on file  Social Needs  . Financial resource strain: Not on file  . Food insecurity - worry: Not on file  . Food insecurity - inability: Not on file  . Transportation needs - medical: Not on file  . Transportation needs - non-medical: Not on file  Occupational History  . Not on file  Tobacco Use  . Smoking status: Former Smoker    Last attempt to quit: 04/04/1989    Years since quitting: 28.3  . Smokeless tobacco: Never Used  Substance and Sexual Activity  . Alcohol use: Yes    Comment: occassionally- 2 glassses of wine 2x/month  . Drug use: No    Comment: take meds as prescribed  . Sexual activity: Not on file  Other Topics Concern  . Not on file  Social History Narrative  . Not on file    Outpatient Medications Prior to Visit  Medication Sig Dispense Refill  . atorvastatin (LIPITOR) 10 MG tablet TAKE 1 TABLET BY MOUTH THREE TIMES WEEKLY AT BEDTIME 15 tablet 5  . beclomethasone (BECONASE-AQ) 42 MCG/SPRAY nasal spray  Place 1 spray into both nostrils 2 (two) times daily. Dose is for each nostril. 25 g 1  . colchicine 0.6 MG tablet 2 tabs po once then 1 tab po q 2 hours prn pain til pain gone, max of 6 tabs in 24 hours or intolerable diarrhea 6 tablet 1  . ketorolac (TORADOL) 10 MG tablet Take 1 tablet (10 mg total) by mouth 2 (two) times daily as needed. 10 tablet 0  . lisinopril (PRINIVIL,ZESTRIL) 10 MG tablet TAKE 1 TABLET (10 MG TOTAL) BY MOUTH DAILY. 30 tablet 5  . metoprolol succinate (TOPROL-XL) 25 MG 24 hr tablet TAKE 1 TABLET (25 MG TOTAL) BY MOUTH DAILY. 30 tablet 5  . promethazine (PHENERGAN) 25 MG tablet Take 1 tablet (25 mg total) by mouth every 6 (six) hours as needed for nausea or vomiting. 20 tablet 0  . triamterene-hydrochlorothiazide (MAXZIDE-25) 37.5-25 MG tablet TAKE 1 TABLET BY MOUTH DAILY. 30 tablet 5  . Vitamin D, Ergocalciferol, (DRISDOL)  50000 units CAPS capsule TAKE 1 CAPSULE (50,000 UNITS TOTAL) BY MOUTH EVERY 7 (SEVEN) DAYS. 4 capsule 3  . fentaNYL (DURAGESIC) 50 MCG/HR Place 1 patch (50 mcg total) onto the skin every 3 (three) days. 10 patch 0  . oxyCODONE (ROXICODONE) 15 MG immediate release tablet Take 1 tablet (15 mg total) by mouth 3 (three) times daily as needed for pain. Can fill May 25, 2017 70 tablet 0  . buPROPion (WELLBUTRIN XL) 150 MG 24 hr tablet Take 1 tablet (150 mg total) by mouth daily. (Patient not taking: Reported on 08/06/2017) 7 tablet 0  . buPROPion (WELLBUTRIN XL) 300 MG 24 hr tablet Take 1 tablet (300 mg total) by mouth daily. (Patient not taking: Reported on 08/06/2017) 30 tablet 1   No facility-administered medications prior to visit.     Allergies  Allergen Reactions  . Cymbalta [Duloxetine Hcl] Other (See Comments)    Headaches  . Fiorinal [Butalbital-Aspirin-Caffeine] Other (See Comments)    Mental Clarity.    Review of Systems  Constitutional: Negative for fever and malaise/fatigue.  HENT: Negative for congestion.   Eyes: Negative for blurred vision.  Respiratory: Negative for shortness of breath.   Cardiovascular: Negative for chest pain, palpitations and leg swelling.  Gastrointestinal: Negative for abdominal pain, blood in stool and nausea.  Genitourinary: Negative for dysuria and frequency.  Musculoskeletal: Positive for back pain and joint pain. Negative for falls.  Skin: Negative for rash.  Neurological: Negative for dizziness, loss of consciousness and headaches.  Endo/Heme/Allergies: Negative for environmental allergies.  Psychiatric/Behavioral: Positive for depression. The patient is nervous/anxious.        Objective:    Physical Exam  Constitutional: She is oriented to person, place, and time. She appears well-developed and well-nourished. No distress.  HENT:  Head: Normocephalic and atraumatic.  Nose: Nose normal.  Eyes: Right eye exhibits no discharge. Left eye  exhibits no discharge.  Neck: Normal range of motion. Neck supple.  Cardiovascular: Normal rate and regular rhythm.  No murmur heard. Pulmonary/Chest: Effort normal and breath sounds normal.  Abdominal: Soft. Bowel sounds are normal. There is no tenderness.  Musculoskeletal: She exhibits no edema.  Neurological: She is alert and oriented to person, place, and time.  Skin: Skin is warm and dry.  Psychiatric: She has a normal mood and affect.  Nursing note and vitals reviewed.   BP 140/89 (BP Location: Left Arm, Patient Position: Sitting, Cuff Size: Normal)   Pulse 67   Temp 98.4 F (36.9 C) (Oral)  Resp 18   Wt 220 lb 9.6 oz (100.1 kg)   SpO2 100%   PF 140 L/min   BMI 36.71 kg/m  Wt Readings from Last 3 Encounters:  08/06/17 220 lb 9.6 oz (100.1 kg)  07/06/17 234 lb (106.1 kg)  05/05/17 243 lb 4 oz (110.3 kg)   BP Readings from Last 3 Encounters:  08/06/17 140/89  07/06/17 122/72  05/05/17 118/72     Immunization History  Administered Date(s) Administered  . Influenza Split 08/08/2011  . Influenza Whole 08/22/2009, 07/01/2010  . Influenza,inj,Quad PF,6+ Mos 07/06/2017  . Td 01/09/2009  . Zoster 09/27/2015    Health Maintenance  Topic Date Due  . HIV Screening  10/24/1976  . PAP SMEAR  04/30/2013  . MAMMOGRAM  01/28/2016  . TETANUS/TDAP  01/10/2019  . COLONOSCOPY  05/03/2022  . INFLUENZA VACCINE  Completed  . Hepatitis C Screening  Completed    Lab Results  Component Value Date   WBC 7.1 07/06/2017   HGB 15.1 (H) 07/06/2017   HCT 46.3 (H) 07/06/2017   PLT 206.0 07/06/2017   GLUCOSE 120 (H) 08/03/2017   CHOL 206 (H) 07/06/2017   TRIG 81.0 07/06/2017   HDL 85.20 07/06/2017   LDLDIRECT 107.0 01/13/2017   LDLCALC 105 (H) 07/06/2017   ALT 46 (H) 08/03/2017   AST 29 08/03/2017   NA 137 08/03/2017   K 3.6 08/03/2017   CL 100 08/03/2017   CREATININE 0.56 08/03/2017   BUN 13 08/03/2017   CO2 29 08/03/2017   TSH 2.03 01/13/2017   HGBA1C 5.7 07/06/2017     Lab Results  Component Value Date   TSH 2.03 01/13/2017   Lab Results  Component Value Date   WBC 7.1 07/06/2017   HGB 15.1 (H) 07/06/2017   HCT 46.3 (H) 07/06/2017   MCV 98.2 07/06/2017   PLT 206.0 07/06/2017   Lab Results  Component Value Date   NA 137 08/03/2017   K 3.6 08/03/2017   CO2 29 08/03/2017   GLUCOSE 120 (H) 08/03/2017   BUN 13 08/03/2017   CREATININE 0.56 08/03/2017   BILITOT 0.7 08/03/2017   ALKPHOS 58 08/03/2017   AST 29 08/03/2017   ALT 46 (H) 08/03/2017   PROT 7.4 08/03/2017   ALBUMIN 4.6 08/03/2017   CALCIUM 11.1 (H) 08/03/2017   GFR 119.12 08/03/2017   Lab Results  Component Value Date   CHOL 206 (H) 07/06/2017   Lab Results  Component Value Date   HDL 85.20 07/06/2017   Lab Results  Component Value Date   LDLCALC 105 (H) 07/06/2017   Lab Results  Component Value Date   TRIG 81.0 07/06/2017   Lab Results  Component Value Date   CHOLHDL 2 07/06/2017   Lab Results  Component Value Date   HGBA1C 5.7 07/06/2017         Assessment & Plan:   Problem List Items Addressed This Visit    Hyperlipidemia    Encouraged heart healthy diet, increase exercise, avoid trans fats, consider a krill oil cap daily      Depression with anxiety    Did not tolerate bupropion with anxiety and irritatbility so she stopped it. Has tolerated Fluoxetine in past. Will  estart and consider adding buspar as needed.       Relevant Medications   FLUoxetine (PROZAC) 10 MG tablet   Essential hypertension    Well controlled, no changes to meds. Encouraged heart healthy diet such as the DASH diet and exercise as tolerated.  Backache    Struggles with back and joint pain, is allowed refills on medications to manage. UDS and contract are up to date      Relevant Medications   fentaNYL (DURAGESIC) 50 MCG/HR   oxyCODONE (ROXICODONE) 15 MG immediate release tablet   HYPERGLYCEMIA     minimize simple carbs. Increase exercise as tolerated.           I have discontinued Makyla C. Lashway's buPROPion and buPROPion. I have also changed her fentaNYL and oxyCODONE. Additionally, I am having her start on FLUoxetine. Lastly, I am having her maintain her beclomethasone, triamterene-hydrochlorothiazide, promethazine, metoprolol succinate, lisinopril, Vitamin D (Ergocalciferol), colchicine, ketorolac, and atorvastatin.  Meds ordered this encounter  Medications  . FLUoxetine (PROZAC) 10 MG tablet    Sig: Take 1-2 tablets (10-20 mg total) daily by mouth.    Dispense:  60 tablet    Refill:  1  . fentaNYL (DURAGESIC) 50 MCG/HR    Sig: Place 1 patch (50 mcg total) every 3 (three) days onto the skin. Can fill on 08/23/17    Dispense:  10 patch    Refill:  0  . oxyCODONE (ROXICODONE) 15 MG immediate release tablet    Sig: Take 1 tablet (15 mg total) 3 (three) times daily as needed by mouth for pain. Can fill on 08/23/17    Dispense:  70 tablet    Refill:  0    CMA served as scribe during this visit. History, Physical and Plan performed by medical provider. Documentation and orders reviewed and attested to.  Penni Homans, MD

## 2017-08-06 NOTE — Patient Instructions (Signed)
Encouraged increased hydration and fiber in diet. Daily probiotics. If bowels not moving can use MOM 2 tbls po in 4 oz of warm prune juice by mouth every 2-3 days. If no results then repeat in 4 hours with  Dulcolax suppository pr, may repeat again in 4 more hours as needed. Seek care if symptoms worsen. Consider daily Miralax and/or Dulcolax if symptoms persist.  Miralax and benefiber together daily to maintain  Constipation, Adult Constipation is when a person:  Poops (has a bowel movement) fewer times in a week than normal.  Has a hard time pooping.  Has poop that is dry, hard, or bigger than normal.  Follow these instructions at home: Eating and drinking   Eat foods that have a lot of fiber, such as: ? Fresh fruits and vegetables. ? Whole grains. ? Beans.  Eat less of foods that are high in fat, low in fiber, or overly processed, such as: ? [place] fries. ? Hamburgers. ? Cookies. ? Candy. ? Soda.  Drink enough fluid to keep your pee (urine) clear or pale yellow. General instructions  Exercise regularly or as told by your doctor.  Go to the restroom when you feel like you need to poop. Do not hold it in.  Take over-the-counter and prescription medicines only as told by your doctor. These include any fiber supplements.  Do pelvic floor retraining exercises, such as: ? Doing deep breathing while relaxing your lower belly (abdomen). ? Relaxing your pelvic floor while pooping.  Watch your condition for any changes.  Keep all follow-up visits as told by your doctor. This is important. Contact a doctor if:  You have pain that gets worse.  You have a fever.  You have not pooped for 4 days.  You throw up (vomit).  You are not hungry.  You lose weight.  You are bleeding from the anus.  You have thin, pencil-like poop (stool). Get help right away if:  You have a fever, and your symptoms suddenly get worse.  You leak poop or have blood in your poop.  Your  belly feels hard or bigger than normal (is bloated).  You have very bad belly pain.  You feel dizzy or you faint. This information is not intended to replace advice given to you by your health care provider. Make sure you discuss any questions you have with your health care provider. Document Released: 03/03/2008 Document Revised: 04/04/2016 Document Reviewed: 03/05/2016 Elsevier Interactive Patient Education  2017 [employer].

## 2017-08-10 NOTE — Assessment & Plan Note (Signed)
Did not tolerate bupropion with anxiety and irritatbility so she stopped it. Has tolerated Fluoxetine in past. Will  estart and consider adding buspar as needed.

## 2017-08-10 NOTE — Assessment & Plan Note (Signed)
Encouraged heart healthy diet, increase exercise, avoid trans fats, consider a krill oil cap daily 

## 2017-08-10 NOTE — Assessment & Plan Note (Signed)
Struggles with back and joint pain, is allowed refills on medications to manage. UDS and contract are up to date

## 2017-08-10 NOTE — Assessment & Plan Note (Signed)
Well controlled, no changes to meds. Encouraged heart healthy diet such as the DASH diet and exercise as tolerated.  °

## 2017-08-10 NOTE — Assessment & Plan Note (Signed)
minimize simple carbs. Increase exercise as tolerated.  

## 2017-08-28 ENCOUNTER — Other Ambulatory Visit: Payer: Self-pay

## 2017-08-28 MED ORDER — BUPROPION HCL ER (XL) 300 MG PO TB24
300.0000 mg | ORAL_TABLET | Freq: Every day | ORAL | 1 refills | Status: DC
Start: 2017-08-28 — End: 2017-10-06

## 2017-09-14 DIAGNOSIS — Z809 Family history of malignant neoplasm, unspecified: Secondary | ICD-10-CM | POA: Diagnosis not present

## 2017-09-17 ENCOUNTER — Other Ambulatory Visit: Payer: Self-pay | Admitting: Family Medicine

## 2017-09-17 DIAGNOSIS — M545 Low back pain: Secondary | ICD-10-CM

## 2017-09-17 DIAGNOSIS — Z79899 Other long term (current) drug therapy: Secondary | ICD-10-CM

## 2017-09-17 MED ORDER — FENTANYL 50 MCG/HR TD PT72
50.0000 ug | MEDICATED_PATCH | TRANSDERMAL | 0 refills | Status: DC
Start: 2017-09-17 — End: 2017-10-06

## 2017-09-17 MED ORDER — OXYCODONE HCL 15 MG PO TABS: 15.0000 mg | ORAL_TABLET | Freq: Three times a day (TID) | ORAL | 0 refills | Status: DC | PRN

## 2017-09-17 NOTE — Telephone Encounter (Signed)
Copied from [place] [id number]. Topic: Quick Communication - Rx Refill/Question >> Sep 17, 2017 11:13 AM Synthia Innocent wrote: Has the patient contacted their pharmacy? No. Control substance   (Agent: If no, request that the patient contact the pharmacy for the refill.)   Preferred [facility]with phone number [facility] [address]   Agent: Please be advised that RX refills may take up to 3 business days. We ask that you follow-up with your pharmacy. Requesting refill of oxyCODONE (ROXICODONE) 15 MG immediate release tablet and fentaNYL (DURAGESIC) 50 MCG/HR

## 2017-09-17 NOTE — Telephone Encounter (Signed)
Requesting:fentanyl   &    Oxycodone Contract:yes UDSnees one  Last OV:08/26/17 Next OV:10/06/17 Last Refill: 08/06/17  Please advise

## 2017-09-18 NOTE — Telephone Encounter (Signed)
Called patient to let her know about her rx

## 2017-09-18 NOTE — Telephone Encounter (Signed)
Duplicate request for fentanyl and oxycodone.

## 2017-10-06 ENCOUNTER — Ambulatory Visit: Payer: BLUE CROSS/BLUE SHIELD | Admitting: Family Medicine

## 2017-10-06 ENCOUNTER — Encounter (HOSPITAL_BASED_OUTPATIENT_CLINIC_OR_DEPARTMENT_OTHER): Payer: Self-pay

## 2017-10-06 ENCOUNTER — Encounter: Payer: Self-pay | Admitting: Family Medicine

## 2017-10-06 ENCOUNTER — Ambulatory Visit (HOSPITAL_BASED_OUTPATIENT_CLINIC_OR_DEPARTMENT_OTHER)
Admission: RE | Admit: 2017-10-06 | Discharge: 2017-10-06 | Disposition: A | Discharge status: Home / Self Care | Payer: BLUE CROSS/BLUE SHIELD | Source: Ambulatory Visit | Attending: Family Medicine | Admitting: Family Medicine

## 2017-10-06 VITALS — BP 118/80 | HR 79 | Temp 98.1°F | Resp 18 | Wt 228.0 lb

## 2017-10-06 DIAGNOSIS — M1A9XX0 Chronic gout, unspecified, without tophus (tophi): Secondary | ICD-10-CM | POA: Diagnosis not present

## 2017-10-06 DIAGNOSIS — M1288 Other specific arthropathies, not elsewhere classified, other specified site: Secondary | ICD-10-CM | POA: Insufficient documentation

## 2017-10-06 DIAGNOSIS — E213 Hyperparathyroidism, unspecified: Secondary | ICD-10-CM

## 2017-10-06 DIAGNOSIS — R739 Hyperglycemia, unspecified: Secondary | ICD-10-CM | POA: Diagnosis not present

## 2017-10-06 DIAGNOSIS — I10 Essential (primary) hypertension: Secondary | ICD-10-CM

## 2017-10-06 DIAGNOSIS — M545 Low back pain, unspecified: Secondary | ICD-10-CM

## 2017-10-06 DIAGNOSIS — E039 Hypothyroidism, unspecified: Secondary | ICD-10-CM | POA: Diagnosis not present

## 2017-10-06 DIAGNOSIS — Z79899 Other long term (current) drug therapy: Secondary | ICD-10-CM

## 2017-10-06 DIAGNOSIS — M4316 Spondylolisthesis, lumbar region: Secondary | ICD-10-CM | POA: Insufficient documentation

## 2017-10-06 DIAGNOSIS — E559 Vitamin D deficiency, unspecified: Secondary | ICD-10-CM | POA: Diagnosis not present

## 2017-10-06 DIAGNOSIS — M5136 Other intervertebral disc degeneration, lumbar region: Secondary | ICD-10-CM | POA: Insufficient documentation

## 2017-10-06 DIAGNOSIS — R7309 Other abnormal glucose: Secondary | ICD-10-CM

## 2017-10-06 MED ORDER — FENTANYL 50 MCG/HR TD PT72: 50.0000 ug | MEDICATED_PATCH | TRANSDERMAL | 0 refills | Status: DC

## 2017-10-06 MED ORDER — OXYCODONE HCL 15 MG PO TABS: 15.0000 mg | ORAL_TABLET | Freq: Three times a day (TID) | ORAL | 0 refills | Status: DC | PRN

## 2017-10-06 NOTE — Assessment & Plan Note (Signed)
Is doing well. No recent flares better since stopping Beer. No need for colchicine. Continue to monitor uric acid levels

## 2017-10-06 NOTE — Patient Instructions (Signed)

## 2017-10-06 NOTE — Assessment & Plan Note (Signed)
Has seen by neurosurgery both Dr Arrie Eastern and Dr Kem Boroughs in the past . Has had epidural treatments in the past. Has had nerve conduction studies in past which confirmed her low back is causing her lower extremity pain and weakness. Proceed with low back xray then likely MRI and referral back to neurosurgery for further evaluation

## 2017-10-06 NOTE — Assessment & Plan Note (Signed)
On Levothyroxine, continue to monitor 

## 2017-10-06 NOTE — Assessment & Plan Note (Signed)
Encouraged DASH diet, decrease po intake and increase exercise as tolerated. Needs 7-8 hours of sleep nightly. Avoid trans fats, eat small, frequent meals every 4-5 hours with lean proteins, complex carbs and healthy fats. Minimize simple carbs 

## 2017-10-06 NOTE — Assessment & Plan Note (Signed)
Well controlled, no changes to meds. Encouraged heart healthy diet such as the DASH diet and exercise as tolerated.  °

## 2017-10-06 NOTE — Progress Notes (Signed)
Subjective:  I acted as a Education administrator for Dr. Charlett Blake. [place], [place]  Patient ID: [name] [name], female    DOB: 1960-10-31, 57 y.o.   MRN: [id number]  No chief complaint on file.   HPI  Patient is in today for a 3 month follow up and is noting worsening low back pain in the last few months.  She denies any falls or trauma but her back pain with radicular symptoms is worsening.  No incontinence.  She has had surgery in the low back before with Dr. Fransisco Beau that was some number of years ago.  She continues to struggle with generalized myalgias and fatigue as well.  She had some previous pain in her right great toe but that is improving.  No recent febrile illness or hospitalization.h .sb  Patient Care Team: Mosie Lukes, MD as PCP - General (Family Medicine)   Past Medical History:  Diagnosis Date  . Acute foot pain, right 01/13/2017  . Acute upper respiratory infections of unspecified site 06/12/2014  . Anxiety and depression    chronic  . Breast cancer screening 09/05/2016  . Cervical cancer screening 09/05/2016  . Chronic back pain   . Depression with anxiety 08/23/2008   Qualifier: Diagnosis of  By: Niel Hummer MD, Lorinda Creed   . Gout 05/05/2017  . History of fibromyalgia    suspected   . Hypertension   . Insomnia   . Knee pain, right 06/24/2015  . Morbid obesity ([place])   . Ovarian cyst, bilateral   . Preventative health care 06/24/2015  . PTSD (post-traumatic stress disorder)   . Vitamin D deficiency 06/24/2015    Past Surgical History:  Procedure Laterality Date  . OVARIAN CYST REMOVAL      Family History  Problem Relation Age of Onset  . Rheum arthritis Father   . Diabetes Father   . Congestive Heart Failure Father   . Hypertension Father   . Stroke Father   . Cancer Maternal Uncle 59       colon  . Hip fracture Maternal Grandmother   . COPD Maternal Grandfather 87       colon  . Lymphoma Paternal Grandmother   . ADD / ADHD Neg Hx   . Depression Neg Hx   . Alcohol  abuse Neg Hx   . Drug abuse Neg Hx     Social History   Socioeconomic History  . Marital status: Divorced    Spouse name: Not on file  . Number of children: Not on file  . Years of education: Not on file  . Highest education level: Not on file  Social Needs  . Financial resource strain: Not on file  . Food insecurity - worry: Not on file  . Food insecurity - inability: Not on file  . Transportation needs - medical: Not on file  . Transportation needs - non-medical: Not on file  Occupational History  . Not on file  Tobacco Use  . Smoking status: Former Smoker    Last attempt to quit: 04/04/1989    Years since quitting: 28.5  . Smokeless tobacco: Never Used  Substance and Sexual Activity  . Alcohol use: Yes    Comment: occassionally- 2 glassses of wine 2x/month  . Drug use: No    Comment: take meds as prescribed  . Sexual activity: Not on file  Other Topics Concern  . Not on file  Social History Narrative  . Not on file    Outpatient Medications Prior  to Visit  Medication Sig Dispense Refill  . atorvastatin (LIPITOR) 10 MG tablet TAKE 1 TABLET BY MOUTH THREE TIMES WEEKLY AT BEDTIME 15 tablet 5  . beclomethasone (BECONASE-AQ) 42 MCG/SPRAY nasal spray Place 1 spray into both nostrils 2 (two) times daily. Dose is for each nostril. 25 g 1  . colchicine 0.6 MG tablet 2 tabs po once then 1 tab po q 2 hours prn pain til pain gone, max of 6 tabs in 24 hours or intolerable diarrhea 6 tablet 1  . FLUoxetine (PROZAC) 10 MG tablet Take 1-2 tablets (10-20 mg total) daily by mouth. 60 tablet 1  . ketorolac (TORADOL) 10 MG tablet Take 1 tablet (10 mg total) by mouth 2 (two) times daily as needed. 10 tablet 0  . lisinopril (PRINIVIL,ZESTRIL) 10 MG tablet TAKE 1 TABLET (10 MG TOTAL) BY MOUTH DAILY. 30 tablet 5  . metoprolol succinate (TOPROL-XL) 25 MG 24 hr tablet TAKE 1 TABLET (25 MG TOTAL) BY MOUTH DAILY. 30 tablet 5  . promethazine (PHENERGAN) 25 MG tablet Take 1 tablet (25 mg total) by  mouth every 6 (six) hours as needed for nausea or vomiting. 20 tablet 0  . buPROPion (WELLBUTRIN XL) 300 MG 24 hr tablet Take 1 tablet (300 mg total) by mouth daily. 30 tablet 1  . fentaNYL (DURAGESIC) 50 MCG/HR Place 1 patch (50 mcg total) onto the skin every 3 (three) days. 10 patch 0  . oxyCODONE (ROXICODONE) 15 MG immediate release tablet Take 1 tablet (15 mg total) by mouth 3 (three) times daily as needed for pain. 70 tablet 0  . triamterene-hydrochlorothiazide (MAXZIDE-25) 37.5-25 MG tablet TAKE 1 TABLET BY MOUTH DAILY. 30 tablet 5  . Vitamin D, Ergocalciferol, (DRISDOL) 50000 units CAPS capsule TAKE 1 CAPSULE (50,000 UNITS TOTAL) BY MOUTH EVERY 7 (SEVEN) DAYS. 4 capsule 3   No facility-administered medications prior to visit.     Allergies  Allergen Reactions  . Cymbalta [Duloxetine Hcl] Other (See Comments)    Headaches  . Fiorinal [Butalbital-Aspirin-Caffeine] Other (See Comments)    Mental Clarity.    Review of Systems  Constitutional: Negative for fever and malaise/fatigue.  HENT: Negative for congestion.   Eyes: Negative for blurred vision.  Respiratory: Negative for cough and shortness of breath.   Cardiovascular: Negative for chest pain, palpitations and leg swelling.  Gastrointestinal: Negative for vomiting.  Musculoskeletal: Positive for back pain and joint pain.  Skin: Negative for rash.  Neurological: Negative for loss of consciousness and headaches.       Objective:    Physical Exam  Constitutional: She is oriented to person, place, and time. She appears well-developed and well-nourished. No distress.  HENT:  Head: Normocephalic and atraumatic.  Eyes: Conjunctivae are normal.  Neck: Normal range of motion. No thyromegaly present.  Cardiovascular: Normal rate and regular rhythm.  Pulmonary/Chest: Effort normal and breath sounds normal. She has no wheezes.  Abdominal: Soft. Bowel sounds are normal. There is no tenderness.  Musculoskeletal: Normal range of  motion. She exhibits no edema or deformity.  Neurological: She is alert and oriented to person, place, and time.  Skin: Skin is warm and dry. She is not diaphoretic.  Psychiatric: She has a normal mood and affect.    BP 118/80 (BP Location: Left Arm, Patient Position: Sitting, Cuff Size: Normal)   Pulse 79   Temp 98.1 F (36.7 C) (Oral)   Resp 18   Wt 228 lb (103.4 kg)   SpO2 95%   BMI 37.94 kg/m  Wt Readings from Last 3 Encounters:  10/06/17 228 lb (103.4 kg)  08/06/17 220 lb 9.6 oz (100.1 kg)  07/06/17 234 lb (106.1 kg)   BP Readings from Last 3 Encounters:  10/06/17 118/80  08/06/17 140/89  07/06/17 122/72     Immunization History  Administered Date(s) Administered  . Influenza Split 08/08/2011  . Influenza Whole 08/22/2009, 07/01/2010  . Influenza,inj,Quad PF,6+ Mos 07/06/2017  . Td 01/09/2009  . Zoster 09/27/2015    Health Maintenance  Topic Date Due  . HIV Screening  10/24/1976  . PAP SMEAR  04/30/2013  . MAMMOGRAM  01/28/2016  . TETANUS/TDAP  01/10/2019  . COLONOSCOPY  05/03/2022  . INFLUENZA VACCINE  Completed  . Hepatitis C Screening  Completed    Lab Results  Component Value Date   WBC 8.9 10/06/2017   HGB 14.8 10/06/2017   HCT 44.6 10/06/2017   PLT 207.0 10/06/2017   GLUCOSE 111 (H) 10/06/2017   CHOL 206 (H) 07/06/2017   TRIG 81.0 07/06/2017   HDL 85.20 07/06/2017   LDLDIRECT 107.0 01/13/2017   LDLCALC 105 (H) 07/06/2017   ALT 17 10/06/2017   AST 18 10/06/2017   NA 142 10/06/2017   K 4.0 10/06/2017   CL 99 10/06/2017   CREATININE 0.51 10/06/2017   BUN 15 10/06/2017   CO2 31 10/06/2017   TSH 2.03 01/13/2017   HGBA1C 5.7 10/06/2017    Lab Results  Component Value Date   TSH 2.03 01/13/2017   Lab Results  Component Value Date   WBC 8.9 10/06/2017   HGB 14.8 10/06/2017   HCT 44.6 10/06/2017   MCV 99.1 10/06/2017   PLT 207.0 10/06/2017   Lab Results  Component Value Date   NA 142 10/06/2017   K 4.0 10/06/2017   CO2 31  10/06/2017   GLUCOSE 111 (H) 10/06/2017   BUN 15 10/06/2017   CREATININE 0.51 10/06/2017   BILITOT 0.7 10/06/2017   ALKPHOS 64 10/06/2017   AST 18 10/06/2017   ALT 17 10/06/2017   PROT 7.6 10/06/2017   ALBUMIN 4.7 10/06/2017   CALCIUM 10.9 (H) 10/06/2017   GFR 132.61 10/06/2017   Lab Results  Component Value Date   CHOL 206 (H) 07/06/2017   Lab Results  Component Value Date   HDL 85.20 07/06/2017   Lab Results  Component Value Date   LDLCALC 105 (H) 07/06/2017   Lab Results  Component Value Date   TRIG 81.0 07/06/2017   Lab Results  Component Value Date   CHOLHDL 2 07/06/2017   Lab Results  Component Value Date   HGBA1C 5.7 10/06/2017         Assessment & Plan:   Problem List Items Addressed This Visit    Hypothyroidism    On Levothyroxine, continue to monitor      MORBID OBESITY    Encouraged DASH diet, decrease po intake and increase exercise as tolerated. Needs 7-8 hours of sleep nightly. Avoid trans fats, eat small, frequent meals every 4-5 hours with lean proteins, complex carbs and healthy fats. Minimize simple carbs      Essential hypertension    Well controlled, no changes to meds. Encouraged heart healthy diet such as the DASH diet and exercise as tolerated.       Relevant Orders   CBC (Completed)   Low back pain    Has seen by neurosurgery both Dr Arrie Eastern and Dr Kem Boroughs in the past . Has had epidural treatments in the past. Has had nerve conduction  studies in past which confirmed her low back is causing her lower extremity pain and weakness. Proceed with low back xray then likely MRI and referral back to neurosurgery for further evaluation      Relevant Medications   oxyCODONE (ROXICODONE) 15 MG immediate release tablet   fentaNYL (DURAGESIC) 50 MCG/HR   Other Relevant Orders   DG Lumbar Spine Complete (Completed)   Ambulatory referral to Neurosurgery   Vitamin D deficiency   Relevant Orders   VITAMIN D 25 Hydroxy (Vit-D Deficiency,  Fractures) (Completed)   Gout    Is doing well. No recent flares better since stopping Beer. No need for colchicine. Continue to monitor uric acid levels      Relevant Orders   Uric acid (Completed)    Other Visit Diagnoses    Hyperglycemia    -  Primary   Relevant Orders   Comprehensive metabolic panel (Completed)   Hemoglobin A1c (Completed)   Hyperparathyroidism ([place])       Relevant Orders   PTH, intact (no Ca) (Completed)   Magnesium (Completed)   History of long-term treatment with high-risk medication          I have discontinued Fallynn C. Mclane's buPROPion. I have also changed her oxyCODONE and fentaNYL. Additionally, I am having her maintain her beclomethasone, promethazine, metoprolol succinate, lisinopril, colchicine, ketorolac, atorvastatin, and FLUoxetine.  Meds ordered this encounter  Medications  . oxyCODONE (ROXICODONE) 15 MG immediate release tablet    Sig: Take 1 tablet (15 mg total) by mouth 3 (three) times daily as needed for pain. May fill on 10/20/2017    Dispense:  70 tablet    Refill:  0  . fentaNYL (DURAGESIC) 50 MCG/HR    Sig: Place 1 patch (50 mcg total) onto the skin every 3 (three) days. May fill on 10/20/17 or later    Dispense:  10 patch    Refill:  0    CMA served as Education administrator during this visit. History, Physical and Plan performed by medical provider. Documentation and orders reviewed and attested to.  Penni Homans, MD

## 2017-10-07 LAB — COMPREHENSIVE METABOLIC PANEL
ALT: 17 U/L (ref 0–35)
AST: 18 U/L (ref 0–37)
Albumin: 4.7 g/dL (ref 3.5–5.2)
Alkaline Phosphatase: 64 U/L (ref 39–117)
BUN: 15 mg/dL (ref 6–23)
CO2: 31 meq/L (ref 19–32)
CREATININE: 0.51 mg/dL (ref 0.40–1.20)
Calcium: 10.9 mg/dL — ABNORMAL HIGH (ref 8.4–10.5)
Chloride: 99 mEq/L (ref 96–112)
GFR: 132.61 mL/min (ref >60.00)
Glucose, Bld: 111 mg/dL — ABNORMAL HIGH (ref 70–99)
POTASSIUM: 4 meq/L (ref 3.5–5.1)
SODIUM: 142 meq/L (ref 135–145)
Total Bilirubin: 0.7 mg/dL (ref 0.2–1.2)
Total Protein: 7.6 g/dL (ref 6.0–8.3)

## 2017-10-07 LAB — MAGNESIUM: Magnesium: 1.8 mg/dL (ref 1.5–2.5)

## 2017-10-07 LAB — CBC
HCT: 44.6 % (ref 36.0–46.0)
Hemoglobin: 14.8 g/dL (ref 12.0–15.0)
MCHC: 33.2 g/dL (ref 30.0–36.0)
MCV: 99.1 fl (ref 78.0–100.0)
Platelets: 207 10*3/uL (ref 150.0–400.0)
RBC: 4.5 Mil/uL (ref 3.87–5.11)
RDW: 15.6 % — ABNORMAL HIGH (ref 11.5–15.5)
WBC: 8.9 10*3/uL (ref 4.0–10.5)

## 2017-10-07 LAB — VITAMIN D 25 HYDROXY (VIT D DEFICIENCY, FRACTURES): VITD: 35.45 ng/mL (ref 30.00–100.00)

## 2017-10-07 LAB — URIC ACID: URIC ACID, SERUM: 7.8 mg/dL — AB (ref 2.4–7.0)

## 2017-10-07 LAB — HEMOGLOBIN A1C: Hgb A1c MFr Bld: 5.7 % (ref 4.6–6.5)

## 2017-10-08 ENCOUNTER — Other Ambulatory Visit: Payer: Self-pay

## 2017-10-08 LAB — PARATHYROID HORMONE, INTACT (NO CA): PTH: 94 pg/mL — ABNORMAL HIGH (ref 14–64)

## 2017-10-08 MED ORDER — VITAMIN D (ERGOCALCIFEROL) 1.25 MG (50000 UNIT) PO CAPS: ORAL_CAPSULE | ORAL | 3 refills | Status: DC

## 2017-10-10 LAB — PAIN MGMT, PROFILE 8 W/CONF, U
6 ACETYLMORPHINE: NEGATIVE ng/mL (ref <10)
ALCOHOL METABOLITES: NEGATIVE ng/mL (ref <500)
Amphetamines: NEGATIVE ng/mL (ref <500)
BENZODIAZEPINES: NEGATIVE ng/mL (ref <100)
Buprenorphine, Urine: NEGATIVE ng/mL (ref <5)
Cocaine Metabolite: NEGATIVE ng/mL (ref <150)
Creatinine: 7.4 mg/dL — ABNORMAL LOW
MARIJUANA METABOLITE: NEGATIVE ng/mL (ref <20)
MDMA: NEGATIVE ng/mL (ref <500)
NOROXYCODONE: 1045 ng/mL — AB (ref <50)
OXYCODONE: 194 ng/mL — AB (ref <50)
OXYMORPHONE: 82 ng/mL — AB (ref <50)
Opiates: NEGATIVE ng/mL (ref <100)
Oxidant: NEGATIVE ug/mL (ref <200)
Oxycodone: POSITIVE ng/mL — AB (ref <100)
Specific Gravity: 1.002 — ABNORMAL LOW (ref >=1.0)
pH: 5.97 (ref 4.5–9.0)

## 2017-10-11 DIAGNOSIS — R739 Hyperglycemia, unspecified: Secondary | ICD-10-CM | POA: Insufficient documentation

## 2017-10-11 DIAGNOSIS — E213 Hyperparathyroidism, unspecified: Secondary | ICD-10-CM | POA: Insufficient documentation

## 2017-10-11 NOTE — Assessment & Plan Note (Signed)
Stable, continue to monitor  ?

## 2017-10-11 NOTE — Assessment & Plan Note (Signed)
hgba1c acceptable, minimize simple carbs. Increase exercise as tolerated.  

## 2017-11-16 ENCOUNTER — Other Ambulatory Visit: Payer: Self-pay | Admitting: Family Medicine

## 2017-11-16 DIAGNOSIS — M545 Low back pain: Secondary | ICD-10-CM

## 2017-11-16 MED ORDER — OXYCODONE HCL 15 MG PO TABS: 15.0000 mg | ORAL_TABLET | Freq: Three times a day (TID) | ORAL | 0 refills | Status: DC | PRN

## 2017-11-16 MED ORDER — FENTANYL 50 MCG/HR TD PT72: 50.0000 ug | MEDICATED_PATCH | TRANSDERMAL | 0 refills | Status: DC

## 2017-11-16 NOTE — Telephone Encounter (Signed)
Please advise requests?  Last oxycodone RX: 10/06/17, #70 to be filled 10/20/17. Pt states she will be out 10/19/17 Last Fentanyl RX:  10/06/17, #10 to be filled 10/20/17.  Pt states she will be out 10/19/17 Last OV:  10/06/17 Next OV:  01/04/18 UDS:  10/06/17, moderate due 01/04/18 CSC: 01/13/17 CSR:  No discrepancies identified

## 2017-11-16 NOTE — Telephone Encounter (Signed)
Thanks sent to pharmacy

## 2017-11-16 NOTE — Telephone Encounter (Signed)
See previous telephone encounter from 11/16/17.

## 2017-11-16 NOTE — Telephone Encounter (Signed)
Pt requesting oxycodone 15 mg IR and fentanyl 50 mcg/hr please. LOV  10/06/17 NOV  01/04/18  Last refill for both was 10/06/17. Please review.

## 2017-11-16 NOTE — Telephone Encounter (Signed)
Copied from [place]. Topic: Quick Communication - Rx Refill/Question >> Nov 16, 2017 11:17 AM Sandi Mariscal E, NT wrote: Medication: fentaNYL (DURAGESIC) 50 MCG/HR and oxyCODONE (ROXICODONE) 15 MG immediate release tablet   Has the patient contacted their pharmacy? Medication can't be called in.   (Agent: If no, request that the patient contact the pharmacy for the refill.)   Preferred Pharmacy (with phone number or street name): Patient would like a call back when medication is ready for pick up.   Agent: Please be advised that RX refills may take up to 3 business days. We ask that you follow-up with your pharmacy.

## 2017-11-17 NOTE — Telephone Encounter (Signed)
Message sent to pt.

## 2017-11-25 ENCOUNTER — Other Ambulatory Visit: Payer: Self-pay | Admitting: Family Medicine

## 2017-12-10 ENCOUNTER — Other Ambulatory Visit: Payer: Self-pay | Admitting: Family Medicine

## 2017-12-10 DIAGNOSIS — M545 Low back pain: Secondary | ICD-10-CM

## 2017-12-10 NOTE — Telephone Encounter (Signed)
Requesting:oxycodone and fentanyl Contract:01/13/17 UDS:10/06/17 moderate risk  Last Visit:10/06/17 Next Visit:01/04/18 Last Refill:11/16/17  Please Advise in absence of PCP

## 2017-12-11 ENCOUNTER — Other Ambulatory Visit: Payer: Self-pay | Admitting: Family Medicine

## 2017-12-11 DIAGNOSIS — M545 Low back pain: Secondary | ICD-10-CM

## 2017-12-11 MED ORDER — OXYCODONE HCL 15 MG PO TABS
15.0000 mg | ORAL_TABLET | Freq: Three times a day (TID) | ORAL | 0 refills | Status: DC | PRN
Start: 2017-12-11 — End: 2017-12-15

## 2017-12-11 NOTE — Telephone Encounter (Signed)
I don't feel comfortable doing both of these ---- does she have enough to last until Monday?

## 2017-12-11 NOTE — Telephone Encounter (Signed)
Copied from [place] [id number]. Topic: Quick Communication - Rx Refill/Question >> Dec 11, 2017  1:06 PM Celedonio Savage L wrote: Medication:  fentaNYL (DURAGESIC) 50 MCG/HR     oxyCODONE (ROXICODONE) 15 MG immediate release tablet   Has the patient contacted their pharmacy? Yes.     (Agent: If no, request that the patient contact the pharmacy for the refill.)   Preferred Pharmacy (with phone number or street name): [id number] [place], [place] [address] AT [place] [phone] (Phone) [phone] (Fax)     Agent: Please be advised that RX refills may take up to 3 business days. We ask that you follow-up with your pharmacy.

## 2017-12-11 NOTE — Telephone Encounter (Signed)
See pt email request from 12/10/17. Refill has been sent and mychart message was sent to pt.

## 2017-12-11 NOTE — Telephone Encounter (Signed)
Last OV: 10/06/17 PCP: [place]: [id number] [place], [place] - [address] AT [place] [phone] (Phone) [phone] (Fax)

## 2017-12-11 NOTE — Telephone Encounter (Signed)
Pt called to check on status of refill, she  states she has enough on the fentanyl patch to last until Monday but not on the oxyCODONE (ROXICODONE

## 2017-12-14 ENCOUNTER — Other Ambulatory Visit: Payer: Self-pay | Admitting: Family Medicine

## 2017-12-14 DIAGNOSIS — M545 Low back pain: Secondary | ICD-10-CM

## 2017-12-15 ENCOUNTER — Telehealth: Payer: Self-pay | Admitting: Family Medicine

## 2017-12-15 ENCOUNTER — Other Ambulatory Visit: Payer: Self-pay | Admitting: Family Medicine

## 2017-12-15 DIAGNOSIS — M545 Low back pain: Secondary | ICD-10-CM

## 2017-12-15 MED ORDER — OXYCODONE HCL 15 MG PO TABS: 15.0000 mg | ORAL_TABLET | Freq: Three times a day (TID) | ORAL | 0 refills | Status: DC | PRN

## 2017-12-15 MED ORDER — FENTANYL 50 MCG/HR TD PT72: 50.0000 ug | MEDICATED_PATCH | TRANSDERMAL | 0 refills | Status: DC

## 2017-12-15 NOTE — Telephone Encounter (Signed)
Sent request over to [facility]

## 2017-12-15 NOTE — Telephone Encounter (Signed)
Copied from [place]. Topic: Quick Communication - Rx Refill/Question >> Dec 15, 2017 11:09 AM [name] [name] wrote: Medication: pt calling for refills on fentanyl patch as she is out and states past due since Dr. Charlett Blake was out of office (see my chart msg) and oxycodone she received 10 day supply 3/15 since Dr. Charlett Blake was out (see mychart msg) - pt prefers to get full - pt asking if better to call or send my chart msgs. Please advise. month at a time to keep meds on track and eliminate extra trips to p/u RX from office or multiple trips to the pharmacy.  Preferred Pharmacy (with phone number or street name): [id number] [place], [address] AT [place] [phone] (Phone) [phone] (Fax)

## 2017-12-15 NOTE — Telephone Encounter (Signed)
Requesting:fentanyl  & Oxycodone  Contract:yes AJL:UNGBMBOM risk next screen 01/05/18  Last OV:09/17/17 Next OV:01/04/18 Last Refill:11/16/17 Database:she is good   Please advise

## 2017-12-16 NOTE — Telephone Encounter (Signed)
Rx has been sent  

## 2017-12-21 NOTE — Telephone Encounter (Addendum)
Patient checking status, Please advise. States pharmacy has not received anything. Call back

## 2017-12-21 NOTE — Telephone Encounter (Signed)
Spoke with pharmacist and he states pt picked up Fentanyl on 12/16/17 but it was only for 10 patches. See pt's request below for fentanyl and Oxycodone that was filled for short term supply in March during PCP's absence.  Please advise?

## 2017-12-21 NOTE — Telephone Encounter (Signed)
Just confirm uds anc contract are utd. Then canhave refill on Oxycodone. She is allowed 70 a month to use prn. Her Fentanyl patch prescription is for a month and since you only change the patch every 3 days. These should have enough til late April

## 2017-12-22 ENCOUNTER — Telehealth: Payer: Self-pay | Admitting: Family Medicine

## 2017-12-22 ENCOUNTER — Other Ambulatory Visit: Payer: Self-pay

## 2017-12-22 DIAGNOSIS — M545 Low back pain: Secondary | ICD-10-CM

## 2017-12-22 MED ORDER — OXYCODONE HCL 15 MG PO TABS: 15.0000 mg | ORAL_TABLET | Freq: Three times a day (TID) | ORAL | 0 refills | Status: AC | PRN

## 2017-12-22 NOTE — Telephone Encounter (Signed)
Copied from [place]. Topic: Quick Communication - See Telephone Encounter >> Dec 22, 2017 11:00 AM Conception Chancy, NT wrote: CRM for notification. See Telephone encounter for: 12/22/17.  Patient is calling and states she is needing her normal refill supply oxyCODONE (ROXICODONE) 15 MG immediate release tablet  She states that when Dr. Charlett Blake was out of the office they gave her a 10 day supply which is 30 pills. She states that she usually gets 70 pills for a month supply. But is still only getting 30 pills which is a 10 day supply. She would like this changed back since Dr. Charlett Blake is back in the office. Please advise.   [id number] [place], [place] - [address] AT [place] [address]  Phone: [phone] Fax: [phone]

## 2017-12-22 NOTE — Telephone Encounter (Signed)
Sent request to pcp  Sent patient mychart message

## 2018-01-04 ENCOUNTER — Ambulatory Visit: Payer: BLUE CROSS/BLUE SHIELD | Admitting: Family Medicine

## 2018-01-04 VITALS — BP 130/76 | HR 97 | Temp 98.6°F

## 2018-01-04 DIAGNOSIS — E559 Vitamin D deficiency, unspecified: Secondary | ICD-10-CM | POA: Diagnosis not present

## 2018-01-04 DIAGNOSIS — E785 Hyperlipidemia, unspecified: Secondary | ICD-10-CM

## 2018-01-04 DIAGNOSIS — R739 Hyperglycemia, unspecified: Secondary | ICD-10-CM

## 2018-01-04 DIAGNOSIS — I10 Essential (primary) hypertension: Secondary | ICD-10-CM

## 2018-01-04 DIAGNOSIS — M545 Low back pain, unspecified: Secondary | ICD-10-CM

## 2018-01-04 DIAGNOSIS — Z79899 Other long term (current) drug therapy: Secondary | ICD-10-CM | POA: Diagnosis not present

## 2018-01-04 MED ORDER — OXYCODONE HCL 15 MG PO TABS: 15.0000 mg | ORAL_TABLET | Freq: Three times a day (TID) | ORAL | 0 refills | Status: DC | PRN

## 2018-01-04 MED ORDER — FLUOXETINE HCL 10 MG PO TABS: 10.0000 mg | ORAL_TABLET | Freq: Every day | ORAL | 2 refills | Status: DC

## 2018-01-04 NOTE — Progress Notes (Signed)
Subjective:  I acted as a Education administrator for Dr. Charlett Blake. [place], [place]  Patient ID: [name] [name], female    DOB: 11-22-1960, 57 y.o.   MRN: [id number]  No chief complaint on file.   HPI  Patient is in today for 3 month follow up and overall is stable. She is still struggling with daily back pain and manages to stay busy and perform her ADLs on her current meds. No recent febrile illness or hospitalizations. No recent injury. Denies CP/palp/SOB/HA/congestion/fevers/GI or GU c/o. Taking meds as prescribed. She is struggling with worsening depression and anhedonia but no suicidal ideation noted. She did not start the Prozac due to cost but agrees to shop around for a better price.   Patient Care Team: Mosie Lukes, MD as PCP - General (Family Medicine)   Past Medical History:  Diagnosis Date  . Acute foot pain, right 01/13/2017  . Acute upper respiratory infections of unspecified site 06/12/2014  . Anxiety and depression    chronic  . Breast cancer screening 09/05/2016  . Cervical cancer screening 09/05/2016  . Chronic back pain   . Depression with anxiety 08/23/2008   Qualifier: Diagnosis of  By: Niel Hummer MD, Lorinda Creed   . Gout 05/05/2017  . History of fibromyalgia    suspected   . Hypertension   . Insomnia   . Knee pain, right 06/24/2015  . Morbid obesity ([place])   . Ovarian cyst, bilateral   . Preventative health care 06/24/2015  . PTSD (post-traumatic stress disorder)   . Vitamin D deficiency 06/24/2015    Past Surgical History:  Procedure Laterality Date  . OVARIAN CYST REMOVAL      Family History  Problem Relation Age of Onset  . Rheum arthritis Father   . Diabetes Father   . Congestive Heart Failure Father   . Hypertension Father   . Stroke Father   . Cancer Maternal Uncle 12       colon  . Hip fracture Maternal Grandmother   . COPD Maternal Grandfather 87       colon  . Lymphoma Paternal Grandmother   . ADD / ADHD Neg Hx   . Depression Neg Hx   . Alcohol abuse Neg  Hx   . Drug abuse Neg Hx     Social History   Socioeconomic History  . Marital status: Divorced    Spouse name: Not on file  . Number of children: Not on file  . Years of education: Not on file  . Highest education level: Not on file  Occupational History  . Not on file  Social Needs  . Financial resource strain: Not on file  . Food insecurity:    Worry: Not on file    Inability: Not on file  . Transportation needs:    Medical: Not on file    Non-medical: Not on file  Tobacco Use  . Smoking status: Former Smoker    Last attempt to quit: 04/04/1989    Years since quitting: 28.7  . Smokeless tobacco: Never Used  Substance and Sexual Activity  . Alcohol use: Yes    Comment: occassionally- 2 glassses of wine 2x/month  . Drug use: No    Comment: take meds as prescribed  . Sexual activity: Not on file  Lifestyle  . Physical activity:    Days per week: Not on file    Minutes per session: Not on file  . Stress: Not on file  Relationships  . Social connections:  Talks on phone: Not on file    Gets together: Not on file    Attends religious service: Not on file    Active member of club or organization: Not on file    Attends meetings of clubs or organizations: Not on file    Relationship status: Not on file  . Intimate partner violence:    Fear of current or ex partner: Not on file    Emotionally abused: Not on file    Physically abused: Not on file    Forced sexual activity: Not on file  Other Topics Concern  . Not on file  Social History Narrative  . Not on file    Outpatient Medications Prior to Visit  Medication Sig Dispense Refill  . atorvastatin (LIPITOR) 10 MG tablet TAKE 1 TABLET BY MOUTH THREE TIMES WEEKLY AT BEDTIME 15 tablet 5  . beclomethasone (BECONASE-AQ) 42 MCG/SPRAY nasal spray Place 1 spray into both nostrils 2 (two) times daily. Dose is for each nostril. 25 g 1  . colchicine 0.6 MG tablet 2 tabs po once then 1 tab po q 2 hours prn pain til pain  gone, max of 6 tabs in 24 hours or intolerable diarrhea 6 tablet 1  . fentaNYL (DURAGESIC) 50 MCG/HR Place 1 patch (50 mcg total) onto the skin every 3 (three) days. May fill on 10/20/17 or later 10 patch 0  . ketorolac (TORADOL) 10 MG tablet Take 1 tablet (10 mg total) by mouth 2 (two) times daily as needed. 10 tablet 0  . lisinopril (PRINIVIL,ZESTRIL) 10 MG tablet TAKE 1 TABLET BY MOUTH EVERY DAY 30 tablet 5  . metoprolol succinate (TOPROL-XL) 25 MG 24 hr tablet TAKE 1 TABLET BY MOUTH EVERY DAY 30 tablet 5  . promethazine (PHENERGAN) 25 MG tablet Take 1 tablet (25 mg total) by mouth every 6 (six) hours as needed for nausea or vomiting. 20 tablet 0  . Vitamin D, Ergocalciferol, (DRISDOL) 50000 units CAPS capsule TAKE 1 CAPSULE (50,000 UNITS TOTAL) BY MOUTH EVERY 7 (SEVEN) DAYS. 4 capsule 3  . FLUoxetine (PROZAC) 10 MG tablet Take 1-2 tablets (10-20 mg total) daily by mouth. 60 tablet 1   No facility-administered medications prior to visit.     Allergies  Allergen Reactions  . Cymbalta [Duloxetine Hcl] Other (See Comments)    Headaches  . Fiorinal [Butalbital-Aspirin-Caffeine] Other (See Comments)    Mental Clarity.    Review of Systems  Constitutional: Negative for fever and malaise/fatigue.  HENT: Negative for congestion.   Eyes: Negative for blurred vision.  Respiratory: Negative for shortness of breath.   Cardiovascular: Negative for chest pain, palpitations and leg swelling.  Gastrointestinal: Negative for abdominal pain, blood in stool and nausea.  Genitourinary: Negative for dysuria and frequency.  Musculoskeletal: Positive for back pain and myalgias. Negative for falls.  Skin: Negative for rash.  Neurological: Negative for dizziness, loss of consciousness and headaches.  Endo/Heme/Allergies: Negative for environmental allergies.  Psychiatric/Behavioral: Positive for depression. The patient is not nervous/anxious.        Objective:    Physical Exam  Constitutional: She is  oriented to person, place, and time. No distress.  HENT:  Head: Normocephalic and atraumatic.  Eyes: Conjunctivae are normal.  Neck: Neck supple. No thyromegaly present.  Cardiovascular: Normal rate, regular rhythm and normal heart sounds.  No murmur heard. Pulmonary/Chest: Effort normal and breath sounds normal. She has no wheezes.  Abdominal: She exhibits no distension and no mass.  Musculoskeletal: She exhibits no edema.  Lymphadenopathy:    She has no cervical adenopathy.  Neurological: She is alert and oriented to person, place, and time.  Skin: Skin is warm and dry. No rash noted. She is not diaphoretic.  Psychiatric: Judgment normal.    BP 130/76 (BP Location: Left Arm, Patient Position: Sitting, Cuff Size: Normal)   Pulse 97   Temp 98.6 F (37 C) (Oral)  Wt Readings from Last 3 Encounters:  10/06/17 228 lb (103.4 kg)  08/06/17 220 lb 9.6 oz (100.1 kg)  07/06/17 234 lb (106.1 kg)   BP Readings from Last 3 Encounters:  01/04/18 130/76  10/06/17 118/80  08/06/17 140/89     Immunization History  Administered Date(s) Administered  . Influenza Split 08/08/2011  . Influenza Whole 08/22/2009, 07/01/2010  . Influenza,inj,Quad PF,6+ Mos 07/06/2017  . Td 01/09/2009  . Zoster 09/27/2015    Health Maintenance  Topic Date Due  . HIV Screening  10/24/1976  . PAP SMEAR  04/30/2013  . MAMMOGRAM  01/28/2016  . INFLUENZA VACCINE  04/29/2018  . TETANUS/TDAP  01/10/2019  . COLONOSCOPY  05/03/2022  . Hepatitis C Screening  Completed    Lab Results  Component Value Date   WBC 8.9 10/06/2017   HGB 14.8 10/06/2017   HCT 44.6 10/06/2017   PLT 207.0 10/06/2017   GLUCOSE 111 (H) 10/06/2017   CHOL 206 (H) 07/06/2017   TRIG 81.0 07/06/2017   HDL 85.20 07/06/2017   LDLDIRECT 107.0 01/13/2017   LDLCALC 105 (H) 07/06/2017   ALT 17 10/06/2017   AST 18 10/06/2017   NA 142 10/06/2017   K 4.0 10/06/2017   CL 99 10/06/2017   CREATININE 0.51 10/06/2017   BUN 15 10/06/2017   CO2  31 10/06/2017   TSH 2.03 01/13/2017   HGBA1C 5.7 10/06/2017    Lab Results  Component Value Date   TSH 2.03 01/13/2017   Lab Results  Component Value Date   WBC 8.9 10/06/2017   HGB 14.8 10/06/2017   HCT 44.6 10/06/2017   MCV 99.1 10/06/2017   PLT 207.0 10/06/2017   Lab Results  Component Value Date   NA 142 10/06/2017   K 4.0 10/06/2017   CO2 31 10/06/2017   GLUCOSE 111 (H) 10/06/2017   BUN 15 10/06/2017   CREATININE 0.51 10/06/2017   BILITOT 0.7 10/06/2017   ALKPHOS 64 10/06/2017   AST 18 10/06/2017   ALT 17 10/06/2017   PROT 7.6 10/06/2017   ALBUMIN 4.7 10/06/2017   CALCIUM 10.9 (H) 10/06/2017   GFR 132.61 10/06/2017   Lab Results  Component Value Date   CHOL 206 (H) 07/06/2017   Lab Results  Component Value Date   HDL 85.20 07/06/2017   Lab Results  Component Value Date   LDLCALC 105 (H) 07/06/2017   Lab Results  Component Value Date   TRIG 81.0 07/06/2017   Lab Results  Component Value Date   CHOLHDL 2 07/06/2017   Lab Results  Component Value Date   HGBA1C 5.7 10/06/2017         Assessment & Plan:   Problem List Items Addressed This Visit    Hyperlipidemia    Tolerating statin, encouraged heart healthy diet, avoid trans fats, minimize simple carbs and saturated fats. Increase exercise as tolerated      MORBID OBESITY    Encouraged DASH diet, decrease po intake and increase exercise as tolerated. Needs 7-8 hours of sleep nightly. Avoid trans fats, eat small, frequent meals every 4-5 hours with lean proteins, complex  carbs and healthy fats. Minimize simple carbs, referral given to [facility] [facility]    Well controlled, no changes to meds. Encouraged heart healthy diet such as the DASH diet and exercise as tolerated.       Low back pain    Encouraged moist heat and gentle stretching as tolerated. May try NSAIDs and prescription meds as directed and report if symptoms worsen or seek immediate care. Does well with the  patch and 1 Oxycodone bid and an occasional break through. Tylenol ES 500 bid should be added. UDS and contract utdc      Relevant Medications   oxyCODONE (ROXICODONE) 15 MG immediate release tablet   Vitamin D deficiency    Take daily supplements      Hyperglycemia    hgba1c acceptable, minimize simple carbs. Increase exercise as tolerated. Continue current meds       Other Visit Diagnoses    High risk medication use    -  Primary   Relevant Orders   Pain Mgmt, Profile 8 w/Conf, U      I have changed Fatoumata C. Polhamus's FLUoxetine and oxyCODONE. I am also having her maintain her beclomethasone, promethazine, colchicine, ketorolac, atorvastatin, Vitamin D (Ergocalciferol), lisinopril, metoprolol succinate, and fentaNYL.  Meds ordered this encounter  Medications  . FLUoxetine (PROZAC) 10 MG tablet    Sig: Take 1-2 tablets (10-20 mg total) by mouth daily.    Dispense:  60 tablet    Refill:  2  . oxyCODONE (ROXICODONE) 15 MG immediate release tablet    Sig: Take 1 tablet (15 mg total) by mouth every 8 (eight) hours as needed for pain.    Dispense:  70 tablet    Refill:  0    CMA served as scribe during this visit. History, Physical and Plan performed by medical provider. Documentation and orders reviewed and attested to.  Penni Homans, MD

## 2018-01-04 NOTE — Assessment & Plan Note (Signed)
hgba1c acceptable, minimize simple carbs. Increase exercise as tolerated. Continue current meds 

## 2018-01-04 NOTE — Assessment & Plan Note (Signed)
Encouraged moist heat and gentle stretching as tolerated. May try NSAIDs and prescription meds as directed and report if symptoms worsen or seek immediate care. Does well with the patch and 1 Oxycodone bid and an occasional break through. Tylenol ES 500 bid should be added. UDS and contract utdc

## 2018-01-04 NOTE — Assessment & Plan Note (Signed)
Take daily supplements 

## 2018-01-04 NOTE — Assessment & Plan Note (Signed)
Well controlled, no changes to meds. Encouraged heart healthy diet such as the DASH diet and exercise as tolerated.  °

## 2018-01-04 NOTE — Assessment & Plan Note (Signed)
Tolerating statin, encouraged heart healthy diet, avoid trans fats, minimize simple carbs and saturated fats. Increase exercise as tolerated 

## 2018-01-04 NOTE — Patient Instructions (Addendum)
CoverMyMeds GoodRx Hypertension Hypertension, commonly called high blood pressure, is when the force of blood pumping through the arteries is too strong. The arteries are the blood vessels that carry blood from the heart throughout the body. Hypertension forces the heart to work harder to pump blood and may cause arteries to become narrow or stiff. Having untreated or uncontrolled hypertension can cause heart attacks, strokes, kidney disease, and other problems. A blood pressure reading consists of a higher number over a lower number. Ideally, your blood pressure should be below 120/80. The first ("top") number is called the systolic pressure. It is a measure of the pressure in your arteries as your heart beats. The second ("bottom") number is called the diastolic pressure. It is a measure of the pressure in your arteries as the heart relaxes. What are the causes? The cause of this condition is not known. What increases the risk? Some risk factors for high blood pressure are under your control. Others are not. Factors you can change  Smoking.  Having type 2 diabetes mellitus, high cholesterol, or both.  Not getting enough exercise or physical activity.  Being overweight.  Having too much fat, sugar, calories, or salt (sodium) in your diet.  Drinking too much alcohol. Factors that are difficult or impossible to change  Having chronic kidney disease.  Having a family history of high blood pressure.  Age. Risk increases with age.  Race. You may be at higher risk if you are African-American.  Gender. Men are at higher risk than women before age 80. After age 24, women are at higher risk than men.  Having obstructive sleep apnea.  Stress. What are the signs or symptoms? Extremely high blood pressure (hypertensive crisis) may cause:  Headache.  Anxiety.  Shortness of breath.  Nosebleed.  Nausea and vomiting.  Severe chest pain.  Jerky movements you cannot control  (seizures).  How is this diagnosed? This condition is diagnosed by measuring your blood pressure while you are seated, with your arm resting on a surface. The cuff of the blood pressure monitor will be placed directly against the skin of your upper arm at the level of your heart. It should be measured at least twice using the same arm. Certain conditions can cause a difference in blood pressure between your right and left arms. Certain factors can cause blood pressure readings to be lower or higher than normal (elevated) for a short period of time:  When your blood pressure is higher when you are in a health care provider's office than when you are at home, this is called white coat hypertension. Most people with this condition do not need medicines.  When your blood pressure is higher at home than when you are in a health care provider's office, this is called masked hypertension. Most people with this condition may need medicines to control blood pressure.  If you have a high blood pressure reading during one visit or you have normal blood pressure with other risk factors:  You may be asked to return on a different day to have your blood pressure checked again.  You may be asked to monitor your blood pressure at home for 1 week or longer.  If you are diagnosed with hypertension, you may have other blood or imaging tests to help your health care provider understand your overall risk for other conditions. How is this treated? This condition is treated by making healthy lifestyle changes, such as eating healthy foods, exercising more, and reducing your alcohol  intake. Your health care provider may prescribe medicine if lifestyle changes are not enough to get your blood pressure under control, and if:  Your systolic blood pressure is above 130.  Your diastolic blood pressure is above 80.  Your personal target blood pressure may vary depending on your medical conditions, your age, and other  factors. Follow these instructions at home: Eating and drinking  Eat a diet that is high in fiber and potassium, and low in sodium, added sugar, and fat. An example eating plan is called the DASH (Dietary Approaches to Stop Hypertension) diet. To eat this way: ? Eat plenty of fresh fruits and vegetables. Try to fill half of your plate at each meal with fruits and vegetables. ? Eat whole grains, such as whole wheat pasta, brown rice, or whole grain bread. Fill about one quarter of your plate with whole grains. ? Eat or drink low-fat dairy products, such as skim milk or low-fat yogurt. ? Avoid fatty cuts of meat, processed or cured meats, and poultry with skin. Fill about one quarter of your plate with lean proteins, such as fish, chicken without skin, beans, eggs, and tofu. ? Avoid premade and processed foods. These tend to be higher in sodium, added sugar, and fat.  Reduce your daily sodium intake. Most people with hypertension should eat less than 1,500 mg of sodium a day.  Limit alcohol intake to no more than 1 drink a day for nonpregnant women and 2 drinks a day for men. One drink equals 12 oz of beer, 5 oz of wine, or 1 oz of hard liquor. Lifestyle  Work with your health care provider to maintain a healthy body weight or to lose weight. Ask what an ideal weight is for you.  Get at least 30 minutes of exercise that causes your heart to beat faster (aerobic exercise) most days of the week. Activities may include walking, swimming, or biking.  Include exercise to strengthen your muscles (resistance exercise), such as pilates or lifting weights, as part of your weekly exercise routine. Try to do these types of exercises for 30 minutes at least 3 days a week.  Do not use any products that contain nicotine or tobacco, such as cigarettes and e-cigarettes. If you need help quitting, ask your health care provider.  Monitor your blood pressure at home as told by your health care provider.  Keep  all follow-up visits as told by your health care provider. This is important. Medicines  Take over-the-counter and prescription medicines only as told by your health care provider. Follow directions carefully. Blood pressure medicines must be taken as prescribed.  Do not skip doses of blood pressure medicine. Doing this puts you at risk for problems and can make the medicine less effective.  Ask your health care provider about side effects or reactions to medicines that you should watch for. Contact a health care provider if:  You think you are having a reaction to a medicine you are taking.  You have headaches that keep coming back (recurring).  You feel dizzy.  You have swelling in your ankles.  You have trouble with your vision. Get help right away if:  You develop a severe headache or confusion.  You have unusual weakness or numbness.  You feel faint.  You have severe pain in your chest or abdomen.  You vomit repeatedly.  You have trouble breathing. Summary  Hypertension is when the force of blood pumping through your arteries is too strong. If this condition  is not controlled, it may put you at risk for serious complications.  Your personal target blood pressure may vary depending on your medical conditions, your age, and other factors. For most people, a normal blood pressure is less than 120/80.  Hypertension is treated with lifestyle changes, medicines, or a combination of both. Lifestyle changes include weight loss, eating a healthy, low-sodium diet, exercising more, and limiting alcohol. This information is not intended to replace advice given to you by your health care provider. Make sure you discuss any questions you have with your health care provider. Document Released: 09/15/2005 Document Revised: 08/13/2016 Document Reviewed: 08/13/2016 Elsevier Interactive Patient Education  [employer].

## 2018-01-04 NOTE — Assessment & Plan Note (Signed)
Encouraged DASH diet, decrease po intake and increase exercise as tolerated. Needs 7-8 hours of sleep nightly. Avoid trans fats, eat small, frequent meals every 4-5 hours with lean proteins, complex carbs and healthy fats. Minimize simple carbs, referral given to [facility]

## 2018-01-06 LAB — PAIN MGMT, PROFILE 8 W/CONF, U
6 Acetylmorphine: NEGATIVE ng/mL (ref <10)
ALCOHOL METABOLITES: NEGATIVE ng/mL (ref <500)
AMPHETAMINES: NEGATIVE ng/mL (ref <500)
BENZODIAZEPINES: NEGATIVE ng/mL (ref <100)
BUPRENORPHINE, URINE: NEGATIVE ng/mL (ref <5)
Cocaine Metabolite: NEGATIVE ng/mL (ref <150)
Creatinine: 7.7 mg/dL — ABNORMAL LOW
MARIJUANA METABOLITE: NEGATIVE ng/mL (ref <20)
MDMA: NEGATIVE ng/mL (ref <500)
NOROXYCODONE: 1057 ng/mL — AB (ref <50)
OPIATES: NEGATIVE ng/mL (ref <100)
OXYCODONE: POSITIVE ng/mL — AB (ref <100)
Oxidant: NEGATIVE ug/mL (ref <200)
Oxycodone: 217 ng/mL — ABNORMAL HIGH (ref <50)
Oxymorphone: 63 ng/mL — ABNORMAL HIGH (ref <50)
Specific Gravity: 1.002 — ABNORMAL LOW (ref >=1.0)
pH: 6.03 (ref 4.5–9.0)

## 2018-01-08 ENCOUNTER — Other Ambulatory Visit: Payer: Self-pay | Admitting: Family Medicine

## 2018-01-14 ENCOUNTER — Other Ambulatory Visit: Payer: Self-pay | Admitting: Family Medicine

## 2018-01-14 DIAGNOSIS — M545 Low back pain: Secondary | ICD-10-CM

## 2018-01-14 MED ORDER — FENTANYL 50 MCG/HR TD PT72: 50.0000 ug | MEDICATED_PATCH | TRANSDERMAL | 0 refills | Status: DC

## 2018-01-14 NOTE — Telephone Encounter (Signed)
Requesting:Fentanyl  Contract:yes WGN:[id number] risk next screen 04/05/18 Last OV:01/04/18 Next OV:04/06/18 Last Refill:12/15/17 Database:no concerns    Please advise

## 2018-01-18 ENCOUNTER — Telehealth: Payer: Self-pay | Admitting: Family Medicine

## 2018-01-18 ENCOUNTER — Telehealth: Payer: Self-pay

## 2018-01-18 NOTE — Telephone Encounter (Unsigned)
Copied from [place] [id number]. Topic: Quick Communication - See Telephone Encounter >> Jan 18, 2018  4:31 PM Neva Seat wrote: Pt called to let Princess know - (Princess told pt to send message to her) Insurance needs to hear from Dr. Charlett Blake to have pt's controlled substances refilled. Please call pt if there is any questions.

## 2018-01-18 NOTE — Telephone Encounter (Signed)
PA initiated via Covermymeds; KEY: [id number]. Awaiting determination.

## 2018-01-19 ENCOUNTER — Encounter: Payer: Self-pay | Admitting: Family Medicine

## 2018-01-20 NOTE — Telephone Encounter (Signed)
PA approved. Effective from 01/18/2018 through 07/20/2018. initial chronic non-cancer pain.

## 2018-01-21 NOTE — Telephone Encounter (Signed)
See My chart message

## 2018-02-04 ENCOUNTER — Other Ambulatory Visit: Payer: Self-pay | Admitting: Family Medicine

## 2018-02-04 NOTE — Telephone Encounter (Signed)
Copied from [place] [id number]. Topic: Quick Communication - Rx Refill/Question >> Feb 04, 2018  5:24 PM Selinda Flavin B, NT wrote: Medication: oxyCODONE (ROXICODONE) 15 MG immediate release tablet Has the patient contacted their pharmacy? Yes.   (Agent: If no, request that the patient contact the pharmacy for the refill.) Preferred Pharmacy (with phone number or street name): [id number] - [place], [place] - [address] AT [place] Agent: Please be advised that RX refills may take up to 3 business days. We ask that you follow-up with your pharmacy.  Patient states that she sent an refill email about this, not seeing that in the chart. Patient states that she is completely out of the medication.

## 2018-02-05 MED ORDER — OXYCODONE HCL 15 MG PO TABS: 15.0000 mg | ORAL_TABLET | Freq: Three times a day (TID) | ORAL | 0 refills | Status: DC | PRN

## 2018-02-05 NOTE — Telephone Encounter (Signed)
Requesting:oxycodone Contract:yes FPO:[id number] risk  04/05/18 Last OV:01/04/18 Next OV:04/06/18  Last Refill:01/04/18 [id number] Database:   Please advise

## 2018-02-09 ENCOUNTER — Other Ambulatory Visit: Payer: Self-pay | Admitting: Family Medicine

## 2018-03-01 ENCOUNTER — Other Ambulatory Visit: Payer: Self-pay | Admitting: Family Medicine

## 2018-03-02 NOTE — Telephone Encounter (Signed)
Patient checking status.

## 2018-03-03 MED ORDER — OXYCODONE HCL 15 MG PO TABS: 15.0000 mg | ORAL_TABLET | Freq: Three times a day (TID) | ORAL | 0 refills | Status: DC | PRN

## 2018-03-03 NOTE — Telephone Encounter (Signed)
Requesting:ROXICODONE 15 MG Contract:01/14/18 UDS:01/04/18 Moderate risk Last OV:01/04/18 Next OV:04/06/2018 Last Refill:02/05/18   Please advise

## 2018-03-10 ENCOUNTER — Other Ambulatory Visit: Payer: Self-pay | Admitting: Family Medicine

## 2018-03-11 ENCOUNTER — Other Ambulatory Visit: Payer: Self-pay | Admitting: Family Medicine

## 2018-03-11 DIAGNOSIS — M545 Low back pain: Secondary | ICD-10-CM

## 2018-03-11 NOTE — Telephone Encounter (Signed)
This is not a Patient of Primary care at [place].

## 2018-03-11 NOTE — Telephone Encounter (Signed)
Copied from [place]. Topic: Quick Communication - Rx Refill/Question >> Mar 11, 2018  2:47 PM Synthia Innocent wrote: Medication: fentaNYL ([place]) 50 MCG/HR   Has the patient contacted their pharmacy? No. Was told by [facility] to call directly (Agent: If no, request that the patient contact the pharmacy for the refill.) (Agent: If yes, when and what did the pharmacy advise?)  Preferred Pharm[facility] (with phone number [facility] [address]  Agent: Please be advised that RX refills may take up to 3 business days. We ask that you follow-up with your pharmacy.

## 2018-03-12 MED ORDER — FENTANYL 50 MCG/HR TD PT72: 50.0000 ug | MEDICATED_PATCH | TRANSDERMAL | 0 refills | Status: DC

## 2018-03-12 NOTE — Telephone Encounter (Signed)
Refill of fentanyl  LOV 01/04/18 Dr. Charlett Blake  [facility] 01/14/18  #10  0 refills  [id number] - [place], [place] - [place]

## 2018-03-12 NOTE — Telephone Encounter (Signed)
Requesting: Fentanyl 52mcg/hr patch every 72hr Contract: yes, 2019 UDS: 01/04/18 Last OV: 01/04/18 Next Ov: 04/06/18 Last refill: 01/14/18 Database: OD risk score 250/999, report printed for Dr. Charlett Blake, left in office.  Please advise.

## 2018-03-12 NOTE — Telephone Encounter (Signed)
Fentanyl patch re-filled already by Dr. Nani Ravens.

## 2018-03-29 ENCOUNTER — Other Ambulatory Visit: Payer: Self-pay | Admitting: Family Medicine

## 2018-03-29 MED ORDER — OXYCODONE HCL 15 MG PO TABS: 15.0000 mg | ORAL_TABLET | Freq: Three times a day (TID) | ORAL | 0 refills | Status: DC | PRN

## 2018-03-29 NOTE — Telephone Encounter (Signed)
Copied from [place] [id number]. Topic: Quick Communication - Rx Refill/Question >> Mar 29, 2018  3:17 PM Judyann Munson wrote: Medication: oxyCODONE (ROXICODONE) 15 MG immediate release tablet  Has the patient contacted their pharmacy? No, was told to contact princess directly  Preferred [facility]with [facility] [address]  Agent: Please be advised that RX refills may take up to 3 business days. We ask that you follow-up with your pharmacy.

## 2018-03-29 NOTE — Telephone Encounter (Signed)
Requesting:oxycodone  Contract:yes WXI:[id number] risk 04/05/18 Last OV:01/04/18 Next OV:7/91/9 Last Refill:03/03/18   #[id number] Database:   Please advise

## 2018-03-30 ENCOUNTER — Other Ambulatory Visit: Payer: Self-pay | Admitting: Family Medicine

## 2018-03-30 NOTE — Telephone Encounter (Signed)
Requesting:Oxycodone Contract:01/04/18 UDS:01/04/18 moderate risk-due 04/05/18 next screening Last Visit:01/04/18 Next Visit:04/06/18 Last Refill:03/30/18 responded to by other means  Please Advise

## 2018-04-02 ENCOUNTER — Other Ambulatory Visit: Payer: Self-pay | Admitting: Family Medicine

## 2018-04-02 MED ORDER — METOPROLOL SUCCINATE ER 25 MG PO TB24: 25.0000 mg | ORAL_TABLET | Freq: Every day | ORAL | 5 refills | Status: DC

## 2018-04-06 ENCOUNTER — Ambulatory Visit: Payer: BLUE CROSS/BLUE SHIELD | Admitting: Family Medicine

## 2018-04-06 ENCOUNTER — Encounter: Payer: Self-pay | Admitting: Family Medicine

## 2018-04-06 VITALS — BP 122/71 | HR 65 | Temp 98.5°F | Resp 18 | Wt 233.0 lb

## 2018-04-06 DIAGNOSIS — R002 Palpitations: Secondary | ICD-10-CM | POA: Diagnosis not present

## 2018-04-06 DIAGNOSIS — M545 Low back pain, unspecified: Secondary | ICD-10-CM

## 2018-04-06 DIAGNOSIS — R0789 Other chest pain: Secondary | ICD-10-CM

## 2018-04-06 DIAGNOSIS — Z79899 Other long term (current) drug therapy: Secondary | ICD-10-CM

## 2018-04-06 DIAGNOSIS — R739 Hyperglycemia, unspecified: Secondary | ICD-10-CM

## 2018-04-06 DIAGNOSIS — I10 Essential (primary) hypertension: Secondary | ICD-10-CM

## 2018-04-06 DIAGNOSIS — E785 Hyperlipidemia, unspecified: Secondary | ICD-10-CM

## 2018-04-06 DIAGNOSIS — E559 Vitamin D deficiency, unspecified: Secondary | ICD-10-CM | POA: Diagnosis not present

## 2018-04-06 NOTE — Patient Instructions (Addendum)
No partially hydrogenated oils, minimize saturated fats.   Carbohydrates need to be minimized. Brown carbs with fiber as opposed to white carbs.  Carbohydrate Counting for Diabetes Mellitus, Adult Carbohydrate counting is a method for keeping track of how many carbohydrates you eat. Eating carbohydrates naturally increases the amount of sugar (glucose) in the blood. Counting how many carbohydrates you eat helps keep your blood glucose within normal limits, which helps you manage your diabetes (diabetes mellitus). It is important to know how many carbohydrates you can safely have in each meal. This is different for every person. A diet and nutrition specialist (registered dietitian) can help you make a meal plan and calculate how many carbohydrates you should have at each meal and snack. Carbohydrates are found in the following foods:  Grains, such as breads and cereals.  Dried beans and soy products.  Starchy vegetables, such as potatoes, peas, and corn.  Fruit and fruit juices.  Milk and yogurt.  Sweets and snack foods, such as cake, cookies, candy, chips, and soft drinks.  How do I count carbohydrates? There are two ways to count carbohydrates in food. You can use either of the methods or a combination of both. Reading "Nutrition Facts" on packaged food The "Nutrition Facts" list is included on the labels of almost all packaged foods and beverages in the [place] It includes:  The serving size.  Information about nutrients in each serving, including the grams (g) of carbohydrate per serving.  To use the "Nutrition Facts":  Decide how many servings you will have.  Multiply the number of servings by the number of carbohydrates per serving.  The resulting number is the total amount of carbohydrates that you will be having.  Learning standard serving sizes of other foods When you eat foods containing carbohydrates that are not packaged or do not include "Nutrition Facts" on the label,  you need to measure the servings in order to count the amount of carbohydrates:  Measure the foods that you will eat with a food scale or measuring cup, if needed.  Decide how many standard-size servings you will eat.  Multiply the number of servings by 15. Most carbohydrate-rich foods have about 15 g of carbohydrates per serving. ? For example, if you eat 8 oz (170 g) of strawberries, you will have eaten 2 servings and 30 g of carbohydrates (2 servings x 15 g = 30 g).  For foods that have more than one food mixed, such as soups and casseroles, you must count the carbohydrates in each food that is included.  The following list contains standard serving sizes of common carbohydrate-rich foods. Each of these servings has about 15 g of carbohydrates:   hamburger bun or  English muffin.   oz (15 mL) syrup.   oz (14 g) jelly.  1 slice of bread.  1 six-inch tortilla.  3 oz (85 g) cooked rice or pasta.  4 oz (113 g) cooked dried beans.  4 oz (113 g) starchy vegetable, such as peas, corn, or potatoes.  4 oz (113 g) hot cereal.  4 oz (113 g) mashed potatoes or  of a large baked potato.  4 oz (113 g) canned or frozen fruit.  4 oz (120 mL) fruit juice.  4-6 crackers.  6 chicken nuggets.  6 oz (170 g) unsweetened dry cereal.  6 oz (170 g) plain fat-free yogurt or yogurt sweetened with artificial sweeteners.  8 oz (240 mL) milk.  8 oz (170 g) fresh fruit or one small  piece of fruit.  24 oz (680 g) popped popcorn.  Example of carbohydrate counting Sample meal  3 oz (85 g) chicken breast.  6 oz (170 g) brown rice.  4 oz (113 g) corn.  8 oz (240 mL) milk.  8 oz (170 g) strawberries with sugar-free whipped topping. Carbohydrate calculation 1. Identify the foods that contain carbohydrates: ? Rice. ? Corn. ? Milk. ? Strawberries. 2. Calculate how many servings you have of each food: ? 2 servings rice. ? 1 serving corn. ? 1 serving milk. ? 1 serving  strawberries. 3. Multiply each number of servings by 15 g: ? 2 servings rice x 15 g = 30 g. ? 1 serving corn x 15 g = 15 g. ? 1 serving milk x 15 g = 15 g. ? 1 serving strawberries x 15 g = 15 g. 4. Add together all of the amounts to find the total grams of carbohydrates eaten: ? 30 g + 15 g + 15 g + 15 g = 75 g of carbohydrates total. This information is not intended to replace advice given to you by your health care provider. Make sure you discuss any questions you have with your h0.0.ealth care provider. Document Released: 09/15/2005 Document Revised: 04/04/2016 Document Reviewed: 02/27/2016 Elsevier Interactive Patient Education  [employer].

## 2018-04-06 NOTE — Progress Notes (Signed)
Subjective:  I acted as a Education administrator for [employer]. Yancey Flemings, [place]   Patient ID: [name] [name], female    DOB: September 18, 1961, 57 y.o.   MRN: [id number]  Chief Complaint  Patient presents with  . Follow-up    HPI  Patient is in today for routine follow up on chronic pain, hyperglycemia, vitamin D deficiency and hyperglycemia. She continues to struggle with daily pain most notably in low back. No recent fall or trauma. She is very anxious about her pain. No recent febrile illness or hospitalizations.   Patient Care Team: Mosie Lukes, MD as PCP - General (Family Medicine)   Past Medical History:  Diagnosis Date  . Acute foot pain, right 01/13/2017  . Acute upper respiratory infections of unspecified site 06/12/2014  . Anxiety and depression    chronic  . Breast cancer screening 09/05/2016  . Cervical cancer screening 09/05/2016  . Chronic back pain   . Depression with anxiety 08/23/2008   Qualifier: Diagnosis of  By: Niel Hummer MD, Lorinda Creed   . Gout 05/05/2017  . History of fibromyalgia    suspected   . Hypertension   . Insomnia   . Knee pain, right 06/24/2015  . Morbid obesity ([place])   . Ovarian cyst, bilateral   . Preventative health care 06/24/2015  . PTSD (post-traumatic stress disorder)   . Vitamin D deficiency 06/24/2015    Past Surgical History:  Procedure Laterality Date  . OVARIAN CYST REMOVAL      Family History  Problem Relation Age of Onset  . Rheum arthritis Father   . Diabetes Father   . Congestive Heart Failure Father   . Hypertension Father   . Stroke Father   . Cancer Maternal Uncle 41       colon  . Hip fracture Maternal Grandmother   . COPD Maternal Grandfather 87       colon  . Lymphoma Paternal Grandmother   . ADD / ADHD Neg Hx   . Depression Neg Hx   . Alcohol abuse Neg Hx   . Drug abuse Neg Hx     Social History   Socioeconomic History  . Marital status: Divorced    Spouse name: Not on file  . Number of children: Not on file  . Years of  education: Not on file  . Highest education level: Not on file  Occupational History  . Not on file  Social Needs  . Financial resource strain: Not on file  . Food insecurity:    Worry: Not on file    Inability: Not on file  . Transportation needs:    Medical: Not on file    Non-medical: Not on file  Tobacco Use  . Smoking status: Former Smoker    Last attempt to quit: 04/04/1989    Years since quitting: 29.0  . Smokeless tobacco: Never Used  Substance and Sexual Activity  . Alcohol use: Yes    Comment: occassionally- 2 glassses of wine 2x/month  . Drug use: No    Comment: take meds as prescribed  . Sexual activity: Not on file  Lifestyle  . Physical activity:    Days per week: Not on file    Minutes per session: Not on file  . Stress: Not on file  Relationships  . Social connections:    Talks on phone: Not on file    Gets together: Not on file    Attends religious service: Not on file    Active member  of club or organization: Not on file    Attends meetings of clubs or organizations: Not on file    Relationship status: Not on file  . Intimate partner violence:    Fear of current or ex partner: Not on file    Emotionally abused: Not on file    Physically abused: Not on file    Forced sexual activity: Not on file  Other Topics Concern  . Not on file  Social History Narrative  . Not on file    Outpatient Medications Prior to Visit  Medication Sig Dispense Refill  . atorvastatin (LIPITOR) 10 MG tablet TAKE 1 TABLET BY MOUTH THREE TIMES WEEKLY AT BEDTIME 15 tablet 5  . beclomethasone (BECONASE-AQ) 42 MCG/SPRAY nasal spray Place 1 spray into both nostrils 2 (two) times daily. Dose is for each nostril. 25 g 1  . colchicine 0.6 MG tablet 2 tabs po once then 1 tab po q 2 hours prn pain til pain gone, max of 6 tabs in 24 hours or intolerable diarrhea 6 tablet 1  . fentaNYL (DURAGESIC) 50 MCG/HR Place 1 patch (50 mcg total) onto the skin every 3 (three) days. May fill on  10/20/17 or later 10 patch 0  . FLUoxetine (PROZAC) 10 MG tablet Take 1-2 tablets (10-20 mg total) by mouth daily. 60 tablet 2  . ketorolac (TORADOL) 10 MG tablet Take 1 tablet (10 mg total) by mouth 2 (two) times daily as needed. 10 tablet 0  . lisinopril (PRINIVIL,ZESTRIL) 10 MG tablet TAKE 1 TABLET BY MOUTH EVERY DAY 30 tablet 5  . metoprolol succinate (TOPROL-XL) 25 MG 24 hr tablet Take 1 tablet (25 mg total) by mouth daily. 30 tablet 5  . oxyCODONE (ROXICODONE) 15 MG immediate release tablet Take 1 tablet (15 mg total) by mouth every 8 (eight) hours as needed for pain. 70 tablet 0  . promethazine (PHENERGAN) 25 MG tablet Take 1 tablet (25 mg total) by mouth every 6 (six) hours as needed for nausea or vomiting. 20 tablet 0  . Vitamin D, Ergocalciferol, (DRISDOL) 50000 units CAPS capsule TAKE 1 CAPSULE (50,000 UNITS TOTAL) BY MOUTH EVERY 7 (SEVEN) DAYS. 4 capsule 0   No facility-administered medications prior to visit.     Allergies  Allergen Reactions  . Cymbalta [Duloxetine Hcl] Other (See Comments)    Headaches  . Fiorinal [Butalbital-Aspirin-Caffeine] Other (See Comments)    Mental Clarity.    Review of Systems  Constitutional: Negative for fever and malaise/fatigue.  HENT: Negative for congestion.   Eyes: Negative for blurred vision.  Respiratory: Negative for shortness of breath.   Cardiovascular: Negative for chest pain, palpitations and leg swelling.  Gastrointestinal: Negative for abdominal pain, blood in stool and nausea.  Genitourinary: Negative for dysuria and frequency.  Musculoskeletal: Positive for back pain and joint pain. Negative for falls.  Skin: Negative for rash.  Neurological: Negative for dizziness, loss of consciousness and headaches.  Endo/Heme/Allergies: Negative for environmental allergies.  Psychiatric/Behavioral: Negative for depression. The patient is not nervous/anxious.        Objective:    Physical Exam  Constitutional: No distress.  HENT:    Left Ear: External ear normal.  Mouth/Throat: No oropharyngeal exudate.  Eyes: EOM are normal. Left eye exhibits no discharge. No scleral icterus.  Neck: No JVD present. Tracheal deviation present.  Cardiovascular: Normal heart sounds and intact distal pulses.  Pulmonary/Chest: No respiratory distress. She has no rales.  Abdominal: She exhibits no distension and no mass. There is tenderness.  There is guarding.  Musculoskeletal: She exhibits edema. She exhibits no tenderness.  Lymphadenopathy:    She has no cervical adenopathy.  Skin: No rash noted. No erythema.    BP 122/71 (BP Location: Left Arm, Patient Position: Sitting, Cuff Size: Large)   Pulse 65   Temp 98.5 F (36.9 C) (Oral)   Resp 18   Wt 233 lb (105.7 kg)   SpO2 100%   BMI 38.77 kg/m  Wt Readings from Last 3 Encounters:  04/06/18 233 lb (105.7 kg)  10/06/17 228 lb (103.4 kg)  08/06/17 220 lb 9.6 oz (100.1 kg)   BP Readings from Last 3 Encounters:  04/06/18 122/71  01/04/18 130/76  10/06/17 118/80     Immunization History  Administered Date(s) Administered  . Influenza Split 08/08/2011  . Influenza Whole 08/22/2009, 07/01/2010  . Influenza,inj,Quad PF,6+ Mos 07/06/2017  . Td 01/09/2009  . Zoster 09/27/2015    Health Maintenance  Topic Date Due  . HIV Screening  10/24/1976  . PAP SMEAR  04/30/2013  . MAMMOGRAM  01/28/2016  . INFLUENZA VACCINE  04/29/2018  . TETANUS/TDAP  01/10/2019  . COLONOSCOPY  05/03/2022  . Hepatitis C Screening  Completed    Lab Results  Component Value Date   WBC 8.9 10/06/2017   HGB 14.8 10/06/2017   HCT 44.6 10/06/2017   PLT 207.0 10/06/2017   GLUCOSE 111 (H) 10/06/2017   CHOL 206 (H) 07/06/2017   TRIG 81.0 07/06/2017   HDL 85.20 07/06/2017   LDLDIRECT 107.0 01/13/2017   LDLCALC 105 (H) 07/06/2017   ALT 17 10/06/2017   AST 18 10/06/2017   NA 142 10/06/2017   K 4.0 10/06/2017   CL 99 10/06/2017   CREATININE 0.51 10/06/2017   BUN 15 10/06/2017   CO2 31 10/06/2017    TSH 2.03 01/13/2017   HGBA1C 5.7 10/06/2017    Lab Results  Component Value Date   TSH 2.03 01/13/2017   Lab Results  Component Value Date   WBC 8.9 10/06/2017   HGB 14.8 10/06/2017   HCT 44.6 10/06/2017   MCV 99.1 10/06/2017   PLT 207.0 10/06/2017   Lab Results  Component Value Date   NA 142 10/06/2017   K 4.0 10/06/2017   CO2 31 10/06/2017   GLUCOSE 111 (H) 10/06/2017   BUN 15 10/06/2017   CREATININE 0.51 10/06/2017   BILITOT 0.7 10/06/2017   ALKPHOS 64 10/06/2017   AST 18 10/06/2017   ALT 17 10/06/2017   PROT 7.6 10/06/2017   ALBUMIN 4.7 10/06/2017   CALCIUM 10.9 (H) 10/06/2017   GFR 132.61 10/06/2017   Lab Results  Component Value Date   CHOL 206 (H) 07/06/2017   Lab Results  Component Value Date   HDL 85.20 07/06/2017   Lab Results  Component Value Date   LDLCALC 105 (H) 07/06/2017   Lab Results  Component Value Date   TRIG 81.0 07/06/2017   Lab Results  Component Value Date   CHOLHDL 2 07/06/2017   Lab Results  Component Value Date   HGBA1C 5.7 10/06/2017         Assessment & Plan:   Problem List Items Addressed This Visit    Hyperlipidemia    Tolerating statin, encouraged heart healthy diet, avoid trans fats, minimize simple carbs and saturated fats. Increase exercise as tolerated      Relevant Orders   Lipid panel   Essential hypertension    Well controlled, no changes to meds. Encouraged heart healthy diet such as the  DASH diet and exercise as tolerated.       Relevant Orders   CBC   Comprehensive metabolic panel   TSH   Low back pain    Continues to struggle with daily pain. Manageable on current meds. Encouraged moist heat and gentle stretching as tolerated. May try NSAIDs and prescription meds as directed and report if symptoms worsen or seek immediate care      PALPITATIONS, OCCASIONAL    With a recent episode of chest discomfort she feels might be stress or reflux. EKG without any significant changes. If this ecomes a  pattern will need referral to cardiology and if she experiencing pain that is different and/or does not remit then she will seek care.       Vitamin D deficiency    Supplement and monitor      Relevant Orders   VITAMIN D 25 Hydroxy (Vit-D Deficiency, Fractures)   Hyperglycemia    hgba1c acceptable, minimize simple carbs. Increase exercise as tolerated.       Relevant Orders   Hemoglobin A1c   TSH    Other Visit Diagnoses    Chest pressure    -  Primary   Relevant Orders   EKG 12-Lead (Completed)   High risk medication use       Relevant Orders   Pain Mgmt, Profile 8 w/Conf, U      I am having [name] [name] maintain her beclomethasone, promethazine, colchicine, ketorolac, atorvastatin, lisinopril, FLUoxetine, fentaNYL, oxyCODONE, Vitamin D (Ergocalciferol), and metoprolol succinate.  No orders of the defined types were placed in this encounter.  CMA served as Education administrator during this visit. History, Physical and Plan performed by medical provider. Documentation and orders reviewed and attested to.  Penni Homans, MD

## 2018-04-06 NOTE — Assessment & Plan Note (Signed)
Well controlled, no changes to meds. Encouraged heart healthy diet such as the DASH diet and exercise as tolerated.  °

## 2018-04-11 NOTE — Assessment & Plan Note (Signed)
Tolerating statin, encouraged heart healthy diet, avoid trans fats, minimize simple carbs and saturated fats. Increase exercise as tolerated 

## 2018-04-11 NOTE — Assessment & Plan Note (Signed)
Supplement and monitor 

## 2018-04-11 NOTE — Assessment & Plan Note (Signed)
Continues to struggle with daily pain. Manageable on current meds. Encouraged moist heat and gentle stretching as tolerated. May try NSAIDs and prescription meds as directed and report if symptoms worsen or seek immediate care

## 2018-04-11 NOTE — Assessment & Plan Note (Signed)
hgba1c acceptable, minimize simple carbs. Increase exercise as tolerated.  

## 2018-04-11 NOTE — Assessment & Plan Note (Signed)
With a recent episode of chest discomfort she feels might be stress or reflux. EKG without any significant changes. If this ecomes a pattern will need referral to cardiology and if she experiencing pain that is different and/or does not remit then she will seek care.

## 2018-04-13 ENCOUNTER — Other Ambulatory Visit: Payer: Self-pay

## 2018-04-14 ENCOUNTER — Other Ambulatory Visit (INDEPENDENT_AMBULATORY_CARE_PROVIDER_SITE_OTHER): Payer: BLUE CROSS/BLUE SHIELD

## 2018-04-14 DIAGNOSIS — Z79899 Other long term (current) drug therapy: Secondary | ICD-10-CM

## 2018-04-14 DIAGNOSIS — R739 Hyperglycemia, unspecified: Secondary | ICD-10-CM | POA: Diagnosis not present

## 2018-04-14 DIAGNOSIS — I10 Essential (primary) hypertension: Secondary | ICD-10-CM | POA: Diagnosis not present

## 2018-04-14 DIAGNOSIS — E559 Vitamin D deficiency, unspecified: Secondary | ICD-10-CM | POA: Diagnosis not present

## 2018-04-14 DIAGNOSIS — E785 Hyperlipidemia, unspecified: Secondary | ICD-10-CM

## 2018-04-15 ENCOUNTER — Other Ambulatory Visit: Payer: Self-pay | Admitting: Family Medicine

## 2018-04-15 DIAGNOSIS — M545 Low back pain: Secondary | ICD-10-CM

## 2018-04-15 LAB — LIPID PANEL
CHOL/HDL RATIO: 3
CHOLESTEROL: 177 mg/dL (ref 0–200)
HDL: 62.6 mg/dL (ref >39.00)
NONHDL: 113.99
TRIGLYCERIDES: 321 mg/dL — AB (ref 0.0–149.0)
VLDL: 64.2 mg/dL — ABNORMAL HIGH (ref 0.0–40.0)

## 2018-04-15 LAB — CBC
HCT: 37.8 % (ref 36.0–46.0)
HEMOGLOBIN: 12.7 g/dL (ref 12.0–15.0)
MCHC: 33.5 g/dL (ref 30.0–36.0)
MCV: 99.6 fl (ref 78.0–100.0)
PLATELETS: 208 10*3/uL (ref 150.0–400.0)
RBC: 3.8 Mil/uL — AB (ref 3.87–5.11)
RDW: 14 % (ref 11.5–15.5)
WBC: 7.9 10*3/uL (ref 4.0–10.5)

## 2018-04-15 LAB — COMPREHENSIVE METABOLIC PANEL
ALK PHOS: 75 U/L (ref 39–117)
ALT: 20 U/L (ref 0–35)
AST: 19 U/L (ref 0–37)
Albumin: 4.1 g/dL (ref 3.5–5.2)
BUN: 9 mg/dL (ref 6–23)
CALCIUM: 10.1 mg/dL (ref 8.4–10.5)
CO2: 30 meq/L (ref 19–32)
Chloride: 103 mEq/L (ref 96–112)
Creatinine, Ser: 0.59 mg/dL (ref 0.40–1.20)
GFR: 111.87 mL/min (ref >60.00)
Glucose, Bld: 104 mg/dL — ABNORMAL HIGH (ref 70–99)
POTASSIUM: 4.5 meq/L (ref 3.5–5.1)
Sodium: 141 mEq/L (ref 135–145)
Total Bilirubin: 0.8 mg/dL (ref 0.2–1.2)
Total Protein: 6.4 g/dL (ref 6.0–8.3)

## 2018-04-15 LAB — TSH: TSH: 5.6 u[IU]/mL — ABNORMAL HIGH (ref 0.35–4.50)

## 2018-04-15 LAB — HEMOGLOBIN A1C: Hgb A1c MFr Bld: 5.7 % (ref 4.6–6.5)

## 2018-04-15 LAB — VITAMIN D 25 HYDROXY (VIT D DEFICIENCY, FRACTURES): VITD: 29.12 ng/mL — ABNORMAL LOW (ref 30.00–100.00)

## 2018-04-15 LAB — LDL CHOLESTEROL, DIRECT: LDL DIRECT: 66 mg/dL

## 2018-04-15 MED ORDER — FENTANYL 50 MCG/HR TD PT72: 50.0000 ug | MEDICATED_PATCH | TRANSDERMAL | 0 refills | Status: DC

## 2018-04-15 NOTE — Telephone Encounter (Signed)
Fentanyl (Duragesic) patchrefill Last Refill:03/12/18 with 10 patches Last OV: 04/06/18 PCP: Dr. Charlett Blake [facility] [address]

## 2018-04-15 NOTE — Telephone Encounter (Signed)
Copied from [place] [phone]. Topic: Quick Communication - See Telephone Encounter >> Apr 15, 2018 10:59 AM Hewitt Shorts wrote: Pt states that she usually talks with [facility] when she is asking for a refill but she is need on for fentanyl   [facility] [address]   Best number for pt is [phone]

## 2018-04-16 ENCOUNTER — Other Ambulatory Visit: Payer: Self-pay | Admitting: Family Medicine

## 2018-04-16 DIAGNOSIS — M545 Low back pain: Secondary | ICD-10-CM

## 2018-04-16 LAB — PAIN MGMT, PROFILE 8 W/CONF, U
6 Acetylmorphine: NEGATIVE ng/mL (ref <10)
AMPHETAMINES: NEGATIVE ng/mL (ref <500)
Alcohol Metabolites: NEGATIVE ng/mL (ref <500)
BUPRENORPHINE, URINE: NEGATIVE ng/mL (ref <5)
Benzodiazepines: NEGATIVE ng/mL (ref <100)
COCAINE METABOLITE: NEGATIVE ng/mL (ref <150)
Creatinine: 5.7 mg/dL — ABNORMAL LOW
MDMA: NEGATIVE ng/mL (ref <500)
Marijuana Metabolite: NEGATIVE ng/mL (ref <20)
Noroxycodone: 699 ng/mL — ABNORMAL HIGH (ref <50)
OPIATES: NEGATIVE ng/mL (ref <100)
OXYCODONE: POSITIVE ng/mL — AB (ref <100)
Oxidant: NEGATIVE ug/mL (ref <200)
Oxycodone: 118 ng/mL — ABNORMAL HIGH (ref <50)
Oxymorphone: 57 ng/mL — ABNORMAL HIGH (ref <50)
PH: 6.57 (ref 4.5–9.0)
SPECIFIC GRAVITY: 1.002 — AB (ref >=1.0)

## 2018-04-16 MED ORDER — FENTANYL 50 MCG/HR TD PT72: 50.0000 ug | MEDICATED_PATCH | TRANSDERMAL | 0 refills | Status: DC

## 2018-04-16 NOTE — Telephone Encounter (Signed)
Medication was sent in on 04/15/18

## 2018-04-26 ENCOUNTER — Telehealth: Payer: Self-pay | Admitting: Family Medicine

## 2018-04-26 ENCOUNTER — Other Ambulatory Visit: Payer: Self-pay | Admitting: Family Medicine

## 2018-04-26 NOTE — Telephone Encounter (Signed)
Copied from [place] [phone]. Topic: Quick Communication - Rx Refill/Question >> Apr 26, 2018  3:50 PM Wynetta Emery, [place] C wrote: Medication: oxyCODONE (ROXICODONE) 15 MG immediate release tablet     Has the patient contacted their pharmacy? No  (Agent: If no, request that the patient contact the pharmacy for the refill.)  (Agent: If yes, when and what did the pharmacy advise?)  Preferred Pharmacy (with phone number or street name): [id number] [place], [address] AT [place] [phone] (Phone) [phone] (Fax)      Agent: Please be advised that RX refills may take up to 3 business days. We ask that you follow-up with your pharmacy.

## 2018-04-27 MED ORDER — OXYCODONE HCL 15 MG PO TABS: 15.0000 mg | ORAL_TABLET | Freq: Three times a day (TID) | ORAL | 0 refills | Status: DC | PRN

## 2018-04-27 NOTE — Telephone Encounter (Signed)
Requesting: oxycodone 15mg  every 8hr prn Contract: 01/04/18 UDS: 04/14/18 Last OV: 04/06/18 Next Ov: 08/10/18 Last refill: 03/29/18, #70, 0RF Database: discrepancies noted., report left on Dr. Frederik Pear desk  Please advise.

## 2018-04-27 NOTE — Telephone Encounter (Signed)
Filled today at preferred RX

## 2018-05-09 ENCOUNTER — Other Ambulatory Visit: Payer: Self-pay | Admitting: Family Medicine

## 2018-05-10 NOTE — Telephone Encounter (Signed)
Pt has completed 12 week course- should she continue?

## 2018-05-10 NOTE — Telephone Encounter (Signed)
Yes stay on it when we checked her last month on it she was still slightly low. Should take the weekly rx as well as a daily OTC dose

## 2018-05-17 ENCOUNTER — Other Ambulatory Visit: Payer: Self-pay | Admitting: Family Medicine

## 2018-05-17 DIAGNOSIS — M545 Low back pain: Secondary | ICD-10-CM

## 2018-05-18 ENCOUNTER — Other Ambulatory Visit: Payer: Self-pay | Admitting: Family Medicine

## 2018-05-18 DIAGNOSIS — M545 Low back pain: Secondary | ICD-10-CM

## 2018-05-18 MED ORDER — FENTANYL 50 MCG/HR TD PT72: 50.0000 ug | MEDICATED_PATCH | TRANSDERMAL | 0 refills | Status: DC

## 2018-05-18 NOTE — Telephone Encounter (Signed)
Refill request for fentanyl.   Last OV: 04/06/2018 Last Fill: 04/16/2018 #10 and 4FX UDS: 04/14/2018 Moderate risk

## 2018-05-20 ENCOUNTER — Other Ambulatory Visit: Payer: Self-pay | Admitting: Family Medicine

## 2018-05-21 ENCOUNTER — Other Ambulatory Visit: Payer: Self-pay | Admitting: Family Medicine

## 2018-05-21 ENCOUNTER — Encounter: Payer: Self-pay | Admitting: Family Medicine

## 2018-05-21 MED ORDER — OXYCODONE HCL 15 MG PO TABS: 15.0000 mg | ORAL_TABLET | Freq: Three times a day (TID) | ORAL | 0 refills | Status: DC | PRN

## 2018-05-21 NOTE — Telephone Encounter (Signed)
Copied from [place] [phone]. Topic: General - Other >> May 21, 2018  9:42 AM Lennox Solders wrote: Reason for CRM: Pt is calling to talk with princess concerning early refill on oxycodone . Pt is out. [facility] [address] [facility] [place].

## 2018-05-21 NOTE — Telephone Encounter (Signed)
Requesting:oxycodone  Contract:yes GBM:[id number] risk next screen 07/15/18 Last OV:04/06/18 Next OV:08/10/18 Last Refill:04/27/18  #[id number] Database:   Please advise

## 2018-05-24 NOTE — Telephone Encounter (Signed)
Medication sent in. 

## 2018-06-02 ENCOUNTER — Other Ambulatory Visit: Payer: Self-pay | Admitting: Family Medicine

## 2018-06-09 ENCOUNTER — Other Ambulatory Visit: Payer: Self-pay | Admitting: Medical

## 2018-06-21 ENCOUNTER — Other Ambulatory Visit: Payer: Self-pay | Admitting: Family Medicine

## 2018-06-21 DIAGNOSIS — M545 Low back pain: Secondary | ICD-10-CM

## 2018-06-21 MED ORDER — OXYCODONE HCL 15 MG PO TABS: 15.0000 mg | ORAL_TABLET | Freq: Three times a day (TID) | ORAL | 0 refills | Status: DC | PRN

## 2018-06-21 MED ORDER — FENTANYL 50 MCG/HR TD PT72: 50.0000 ug | MEDICATED_PATCH | TRANSDERMAL | 0 refills | Status: DC

## 2018-06-21 NOTE — Telephone Encounter (Signed)
Refill request for Fentanyl.   Last OV: 05/18/2018 Last Fill: 05/18/2018 #10 and 0RF UDS: 04/14/2018 Moderate risk

## 2018-06-21 NOTE — Telephone Encounter (Signed)
Copied from [place] [phone]. Topic: Quick Communication - Rx Refill/Question >> Jun 21, 2018  2:29 PM Mylinda Latina, NT wrote: Medication: oxyCODONE (ROXICODONE) 15 MG immediate release tablet  Has the patient contacted their pharmacy? No. Was told to call office  (Agent: If no, request that the patient contact the pharmacy for the refill.) (Agent: If yes, when and what did the pharmacy advise?)  Preferred Pharmacy (with phone number or street name): [id number] [place], [place] [address] AT [place] [phone] (Phone) [phone] (Fax)    Agent: Please be advised that RX refills may take up to 3 business days. We ask that you follow-up with your pharmacy.

## 2018-06-21 NOTE — Telephone Encounter (Signed)
Pt is requesting refill on oxycodone.   Last OV: 05/18/2018 Last Fill: 05/21/2018 #70 and 0RF UDS: 04/14/2018 Moderate risk

## 2018-07-18 ENCOUNTER — Encounter: Payer: Self-pay | Admitting: Family Medicine

## 2018-07-18 ENCOUNTER — Other Ambulatory Visit: Payer: Self-pay | Admitting: Family Medicine

## 2018-07-19 MED ORDER — OXYCODONE HCL 15 MG PO TABS: 15.0000 mg | ORAL_TABLET | Freq: Three times a day (TID) | ORAL | 0 refills | Status: DC | PRN

## 2018-07-19 MED ORDER — FENTANYL 50 MCG/HR TD PT72: 50.0000 ug | MEDICATED_PATCH | TRANSDERMAL | 0 refills | Status: DC

## 2018-07-19 MED ORDER — LISINOPRIL 10 MG PO TABS: 10.0000 mg | ORAL_TABLET | Freq: Every day | ORAL | 5 refills | Status: DC

## 2018-07-19 NOTE — Telephone Encounter (Signed)
Pt is requesting refill on oxycodone and Fentanyl.   Last OV: 05/18/2018 Last Fill on both: 06/21/2018 UDS: 04/14/2018 Moderate risk

## 2018-08-05 ENCOUNTER — Other Ambulatory Visit: Payer: Self-pay | Admitting: Family Medicine

## 2018-08-09 NOTE — Telephone Encounter (Signed)
Need to forward this to [facility]. Do not routinely check Phosphorus with Vitamin D and it stops the prescription for not nomal vitamin D but that is why we are prescribing this so it does not make sense to stop the prescription. Maybe stop the prescription for a high vitamin D level. I have refilled.

## 2018-08-09 NOTE — Telephone Encounter (Signed)
Please advise Vitamin D request. Last RX 05/11/18 and pt has follow up with you tomorrow.

## 2018-08-10 ENCOUNTER — Encounter: Payer: Self-pay | Admitting: Family Medicine

## 2018-08-10 ENCOUNTER — Ambulatory Visit (INDEPENDENT_AMBULATORY_CARE_PROVIDER_SITE_OTHER): Payer: BLUE CROSS/BLUE SHIELD | Admitting: Family Medicine

## 2018-08-10 VITALS — BP 112/70 | HR 73 | Temp 98.6°F | Resp 16 | Ht 65.0 in | Wt 238.0 lb

## 2018-08-10 DIAGNOSIS — R739 Hyperglycemia, unspecified: Secondary | ICD-10-CM

## 2018-08-10 DIAGNOSIS — E213 Hyperparathyroidism, unspecified: Secondary | ICD-10-CM | POA: Diagnosis not present

## 2018-08-10 DIAGNOSIS — Z0000 Encounter for general adult medical examination without abnormal findings: Secondary | ICD-10-CM

## 2018-08-10 DIAGNOSIS — Z79899 Other long term (current) drug therapy: Secondary | ICD-10-CM

## 2018-08-10 DIAGNOSIS — E559 Vitamin D deficiency, unspecified: Secondary | ICD-10-CM | POA: Diagnosis not present

## 2018-08-10 DIAGNOSIS — Z1211 Encounter for screening for malignant neoplasm of colon: Secondary | ICD-10-CM | POA: Diagnosis not present

## 2018-08-10 DIAGNOSIS — Z23 Encounter for immunization: Secondary | ICD-10-CM | POA: Diagnosis not present

## 2018-08-10 DIAGNOSIS — E039 Hypothyroidism, unspecified: Secondary | ICD-10-CM | POA: Diagnosis not present

## 2018-08-10 DIAGNOSIS — K635 Polyp of colon: Secondary | ICD-10-CM

## 2018-08-10 DIAGNOSIS — I10 Essential (primary) hypertension: Secondary | ICD-10-CM | POA: Diagnosis not present

## 2018-08-10 DIAGNOSIS — Z1239 Encounter for other screening for malignant neoplasm of breast: Secondary | ICD-10-CM | POA: Diagnosis not present

## 2018-08-10 DIAGNOSIS — E785 Hyperlipidemia, unspecified: Secondary | ICD-10-CM | POA: Diagnosis not present

## 2018-08-10 DIAGNOSIS — Z86010 Personal history of colonic polyps: Secondary | ICD-10-CM | POA: Insufficient documentation

## 2018-08-10 DIAGNOSIS — R112 Nausea with vomiting, unspecified: Secondary | ICD-10-CM

## 2018-08-10 DIAGNOSIS — M1A9XX0 Chronic gout, unspecified, without tophus (tophi): Secondary | ICD-10-CM | POA: Diagnosis not present

## 2018-08-10 DIAGNOSIS — K5900 Constipation, unspecified: Secondary | ICD-10-CM

## 2018-08-10 MED ORDER — OXYCODONE HCL 15 MG PO TABS: 15.0000 mg | ORAL_TABLET | Freq: Three times a day (TID) | ORAL | 0 refills | Status: DC | PRN

## 2018-08-10 MED ORDER — ONDANSETRON HCL 4 MG PO TABS: 4.0000 mg | ORAL_TABLET | Freq: Three times a day (TID) | ORAL | 2 refills | Status: DC | PRN

## 2018-08-10 NOTE — Patient Instructions (Signed)
Shingrix is the new shingles shot 2 shots over 2-6 months, check with insurance regarding coverage and then can return for nurse visit  Preventive Care 40-64 Years, Female Preventive care refers to lifestyle choices and visits with your health care provider that can promote health and wellness. What does preventive care include?  A yearly physical exam. This is also called an annual well check.  Dental exams once or twice a year.  Routine eye exams. Ask your health care provider how often you should have your eyes checked.  Personal lifestyle choices, including: ? Daily care of your teeth and gums. ? Regular physical activity. ? Eating a healthy diet. ? Avoiding tobacco and drug use. ? Limiting alcohol use. ? Practicing safe sex. ? Taking low-dose aspirin daily starting at age 48. ? Taking vitamin and mineral supplements as recommended by your health care provider. What happens during an annual well check? The services and screenings done by your health care provider during your annual well check will depend on your age, overall health, lifestyle risk factors, and family history of disease. Counseling Your health care provider may ask you questions about your:  Alcohol use.  Tobacco use.  Drug use.  Emotional well-being.  Home and relationship well-being.  Sexual activity.  Eating habits.  Work and work Statistician.  Method of birth control.  Menstrual cycle.  Pregnancy history.  Screening You may have the following tests or measurements:  Height, weight, and BMI.  Blood pressure.  Lipid and cholesterol levels. These may be checked every 5 years, or more frequently if you are over 82 years old.  Skin check.  Lung cancer screening. You may have this screening every year starting at age 69 if you have a 30-pack-year history of smoking and currently smoke or have quit within the past 15 years.  Fecal occult blood test (FOBT) of the stool. You may have this  test every year starting at age 48.  Flexible sigmoidoscopy or colonoscopy. You may have a sigmoidoscopy every 5 years or a colonoscopy every 10 years starting at age 86.  Hepatitis C blood test.  Hepatitis B blood test.  Sexually transmitted disease (STD) testing.  Diabetes screening. This is done by checking your blood sugar (glucose) after you have not eaten for a while (fasting). You may have this done every 1-3 years.  Mammogram. This may be done every 1-2 years. Talk to your health care provider about when you should start having regular mammograms. This may depend on whether you have a family history of breast cancer.  BRCA-related cancer screening. This may be done if you have a family history of breast, ovarian, tubal, or peritoneal cancers.  Pelvic exam and Pap test. This may be done every 3 years starting at age 75. Starting at age 57, this may be done every 5 years if you have a Pap test in combination with an HPV test.  Bone density scan. This is done to screen for osteoporosis. You may have this scan if you are at high risk for osteoporosis.  Discuss your test results, treatment options, and if necessary, the need for more tests with your health care provider. Vaccines Your health care provider may recommend certain vaccines, such as:  Influenza vaccine. This is recommended every year.  Tetanus, diphtheria, and acellular pertussis (Tdap, Td) vaccine. You may need a Td booster every 10 years.  Varicella vaccine. You may need this if you have not been vaccinated.  Zoster vaccine. You may need this  after age 37.  Measles, mumps, and rubella (MMR) vaccine. You may need at least one dose of MMR if you were born in 1957 or later. You may also need a second dose.  Pneumococcal 13-valent conjugate (PCV13) vaccine. You may need this if you have certain conditions and were not previously vaccinated.  Pneumococcal polysaccharide (PPSV23) vaccine. You may need one or two doses  if you smoke cigarettes or if you have certain conditions.  Meningococcal vaccine. You may need this if you have certain conditions.  Hepatitis A vaccine. You may need this if you have certain conditions or if you travel or work in places where you may be exposed to hepatitis A.  Hepatitis B vaccine. You may need this if you have certain conditions or if you travel or work in places where you may be exposed to hepatitis B.  Haemophilus influenzae type b (Hib) vaccine. You may need this if you have certain conditions.  Talk to your health care provider about which screenings and vaccines you need and how often you need them. This information is not intended to replace advice given to you by your health care provider. Make sure you discuss any questions you have with your health care provider. Document Released: 10/12/2015 Document Revised: 06/04/2016 Document Reviewed: 07/17/2015 Elsevier Interactive Patient Education  [employer].

## 2018-08-10 NOTE — Assessment & Plan Note (Signed)
Supplement and monitor 

## 2018-08-10 NOTE — Assessment & Plan Note (Signed)
Encouraged increased hydration and fiber in diet. Daily probiotics. If bowels not moving can use MOM 2 tbls po in 4 oz of warm prune juice by mouth every 2-3 days. If no results then repeat in 4 hours with  Dulcolax suppository pr, may repeat again in 4 more hours as needed. Seek care if symptoms worsen. Consider daily Miralax and/or Dulcolax if symptoms persist.  

## 2018-08-10 NOTE — Assessment & Plan Note (Signed)
Patient encouraged to maintain heart healthy diet, regular exercise, adequate sleep. Consider daily probiotics. Take medications as prescribed. Sees [facility] for paps

## 2018-08-10 NOTE — Assessment & Plan Note (Signed)
Has a strong gag reflex. She agrees to try Zofran to see if that helps

## 2018-08-10 NOTE — Assessment & Plan Note (Signed)
Encouraged DASH diet, decrease po intake and increase exercise as tolerated. Needs 7-8 hours of sleep nightly. Avoid trans fats, eat small, frequent meals every 4-5 hours with lean proteins, complex carbs and healthy fats. Minimize simple carbs 

## 2018-08-10 NOTE — Assessment & Plan Note (Signed)
last colonoscopy in 2013 referred for follow up

## 2018-08-10 NOTE — Assessment & Plan Note (Signed)
On Levothyroxine, continue to monitor 

## 2018-08-10 NOTE — Assessment & Plan Note (Signed)
hgba1c acceptable, minimize simple carbs. Increase exercise as tolerated.  

## 2018-08-10 NOTE — Assessment & Plan Note (Signed)
Well controlled, no changes to meds. Encouraged heart healthy diet such as the DASH diet and exercise as tolerated.  °

## 2018-08-10 NOTE — Telephone Encounter (Signed)
Copy given to clinical supervisor to address with [facility].

## 2018-08-10 NOTE — Assessment & Plan Note (Signed)
Encouraged heart healthy diet, increase exercise, avoid trans fats, consider a krill oil cap daily 

## 2018-08-10 NOTE — Assessment & Plan Note (Signed)
Continue to monitor

## 2018-08-10 NOTE — Assessment & Plan Note (Signed)
Check uric acid. 

## 2018-08-10 NOTE — Progress Notes (Signed)
Subjective:    Patient ID: [name] [name], female    DOB: 1961-06-26, 57 y.o.   MRN: [id number]  Chief Complaint  Patient presents with  . Annual Exam    HPI Patient is in today for annual preventative exam and follow up on chronic medical conditions including vitamin D deficiency, hyperglycemia and hyperlipidemia. No polyuria or polydipsia. Tries to stay active and eat a heart healthy diet. Denies CP/palp/SOB/HA/congestion/fevers/GI or GU c/o. Taking meds as prescribed. Last took her Vitamin D 3 days ago and tolerates it well. Sees Gynecology for paps. Does well with activities of daily living. She notes brushing her teeth she gags and can vomit. Recently had an episode of vomiting 6 x after brushing teeth. Phenergan did not help.   Past Medical History:  Diagnosis Date  . Acute foot pain, right 01/13/2017  . Acute upper respiratory infections of unspecified site 06/12/2014  . Anxiety and depression    chronic  . Breast cancer screening 09/05/2016  . Cervical cancer screening 09/05/2016  . Chronic back pain   . Depression with anxiety 08/23/2008   Qualifier: Diagnosis of  By: Niel Hummer MD, Lorinda Creed   . Gout 05/05/2017  . History of fibromyalgia    suspected   . Hypertension   . Insomnia   . Knee pain, right 06/24/2015  . Morbid obesity ([place])   . Ovarian cyst, bilateral   . Preventative health care 06/24/2015  . PTSD (post-traumatic stress disorder)   . Vitamin D deficiency 06/24/2015    Past Surgical History:  Procedure Laterality Date  . OVARIAN CYST REMOVAL      Family History  Problem Relation Age of Onset  . Rheum arthritis Father   . Diabetes Father   . Congestive Heart Failure Father   . Hypertension Father   . Stroke Father   . Cancer Maternal Uncle 54       colon  . Hip fracture Maternal Grandmother   . COPD Maternal Grandfather 87       colon  . Lymphoma Paternal Grandmother   . ADD / ADHD Neg Hx   . Depression Neg Hx   . Alcohol abuse Neg Hx   . Drug  abuse Neg Hx     Social History   Socioeconomic History  . Marital status: Divorced    Spouse name: Not on file  . Number of children: Not on file  . Years of education: Not on file  . Highest education level: Not on file  Occupational History  . Not on file  Social Needs  . Financial resource strain: Not on file  . Food insecurity:    Worry: Not on file    Inability: Not on file  . Transportation needs:    Medical: Not on file    Non-medical: Not on file  Tobacco Use  . Smoking status: Former Smoker    Last attempt to quit: 04/04/1989    Years since quitting: 29.3  . Smokeless tobacco: Never Used  Substance and Sexual Activity  . Alcohol use: Yes    Comment: occassionally- 2 glassses of wine 2x/month  . Drug use: No    Comment: take meds as prescribed  . Sexual activity: Not on file  Lifestyle  . Physical activity:    Days per week: Not on file    Minutes per session: Not on file  . Stress: Not on file  Relationships  . Social connections:    Talks on phone: Not on file  Gets together: Not on file    Attends religious service: Not on file    Active member of club or organization: Not on file    Attends meetings of clubs or organizations: Not on file    Relationship status: Not on file  . Intimate partner violence:    Fear of current or ex partner: Not on file    Emotionally abused: Not on file    Physically abused: Not on file    Forced sexual activity: Not on file  Other Topics Concern  . Not on file  Social History Narrative  . Not on file    Outpatient Medications Prior to Visit  Medication Sig Dispense Refill  . atorvastatin (LIPITOR) 10 MG tablet TAKE 1 TABLET BY MOUTH THREE TIMES WEEKLY AT BEDTIME 15 tablet 5  . beclomethasone (BECONASE-AQ) 42 MCG/SPRAY nasal spray Place 1 spray into both nostrils 2 (two) times daily. Dose is for each nostril. 25 g 1  . fentaNYL (DURAGESIC) 50 MCG/HR Place 1 patch (50 mcg total) onto the skin every 3 (three) days.  May fill on 10/20/17 or later 10 patch 0  . Fluoxetine HCl, PMDD, 10 MG TABS TAKE 1 TO 2 TABLETS EVERY DAY 180 tablet 1  . lisinopril (PRINIVIL,ZESTRIL) 10 MG tablet Take 1 tablet (10 mg total) by mouth daily. 30 tablet 5  . metoprolol succinate (TOPROL-XL) 25 MG 24 hr tablet Take 1 tablet (25 mg total) by mouth daily. 30 tablet 5  . oxyCODONE (ROXICODONE) 15 MG immediate release tablet Take 1 tablet (15 mg total) by mouth every 8 (eight) hours as needed for pain. 70 tablet 0  . Vitamin D, Ergocalciferol, (DRISDOL) 1.25 MG (50000 UT) CAPS capsule TAKE 1 CAPSULE (50,000 UNITS TOTAL) BY MOUTH EVERY 7 (SEVEN) DAYS. 12 capsule 0  . colchicine 0.6 MG tablet 2 tabs po once then 1 tab po q 2 hours prn pain til pain gone, max of 6 tabs in 24 hours or intolerable diarrhea (Patient not taking: Reported on 08/10/2018) 6 tablet 1  . ketorolac (TORADOL) 10 MG tablet Take 1 tablet (10 mg total) by mouth 2 (two) times daily as needed. (Patient not taking: Reported on 08/10/2018) 10 tablet 0  . promethazine (PHENERGAN) 25 MG tablet Take 1 tablet (25 mg total) by mouth every 6 (six) hours as needed for nausea or vomiting. (Patient not taking: Reported on 08/10/2018) 20 tablet 0   No facility-administered medications prior to visit.     Allergies  Allergen Reactions  . Cymbalta [Duloxetine Hcl] Other (See Comments)    Headaches  . Fiorinal [Butalbital-Aspirin-Caffeine] Other (See Comments)    Mental Clarity.    Review of Systems  Constitutional: Negative for chills, fever and malaise/fatigue.  HENT: Negative for congestion and hearing loss.   Eyes: Negative for discharge.  Respiratory: Negative for cough, sputum production and shortness of breath.   Cardiovascular: Negative for chest pain, palpitations and leg swelling.  Gastrointestinal: Negative for abdominal pain, blood in stool, constipation, diarrhea, heartburn, nausea and vomiting.  Genitourinary: Negative for dysuria, frequency, hematuria and  urgency.  Musculoskeletal: Negative for back pain, falls and myalgias.  Skin: Negative for rash.  Neurological: Negative for dizziness, sensory change, loss of consciousness, weakness and headaches.  Endo/Heme/Allergies: Negative for environmental allergies. Does not bruise/bleed easily.  Psychiatric/Behavioral: Negative for depression and suicidal ideas. The patient is not nervous/anxious and does not have insomnia.        Objective:    Physical Exam  Constitutional: She is  oriented to person, place, and time. No distress.  HENT:  Head: Normocephalic and atraumatic.  Right Ear: External ear normal.  Left Ear: External ear normal.  Nose: Nose normal.  Mouth/Throat: Oropharynx is clear and moist. No oropharyngeal exudate.  Eyes: Pupils are equal, round, and reactive to light. Conjunctivae are normal. Right eye exhibits no discharge. Left eye exhibits no discharge. No scleral icterus.  Neck: Normal range of motion. Neck supple. No thyromegaly present.  Cardiovascular: Normal rate, regular rhythm, normal heart sounds and intact distal pulses.  No murmur heard. Pulmonary/Chest: Effort normal and breath sounds normal. No respiratory distress. She has no wheezes. She has no rales.  Abdominal: Soft. Bowel sounds are normal. She exhibits no distension and no mass. There is no tenderness.  Musculoskeletal: Normal range of motion. She exhibits no edema or tenderness.  Lymphadenopathy:    She has no cervical adenopathy.  Neurological: She is alert and oriented to person, place, and time. She has normal reflexes. She displays normal reflexes. No cranial nerve deficit. Coordination normal.  Skin: Skin is warm and dry. No rash noted. She is not diaphoretic.    BP 112/70 (BP Location: Left Arm, Patient Position: Sitting, Cuff Size: Large)   Pulse 73   Temp 98.6 F (37 C) (Oral)   Resp 16   Ht 5\' 5"  (1.651 m)   Wt 238 lb (108 kg)   SpO2 98%   BMI 39.61 kg/m  Wt Readings from Last 3  Encounters:  08/10/18 238 lb (108 kg)  04/06/18 233 lb (105.7 kg)  10/06/17 228 lb (103.4 kg)     Lab Results  Component Value Date   WBC 7.9 04/14/2018   HGB 12.7 04/14/2018   HCT 37.8 04/14/2018   PLT 208.0 04/14/2018   GLUCOSE 104 (H) 04/14/2018   CHOL 177 04/14/2018   TRIG 321.0 (H) 04/14/2018   HDL 62.60 04/14/2018   LDLDIRECT 66.0 04/14/2018   LDLCALC 105 (H) 07/06/2017   ALT 20 04/14/2018   AST 19 04/14/2018   NA 141 04/14/2018   K 4.5 04/14/2018   CL 103 04/14/2018   CREATININE 0.59 04/14/2018   BUN 9 04/14/2018   CO2 30 04/14/2018   TSH 5.60 (H) 04/14/2018   HGBA1C 5.7 04/14/2018    Lab Results  Component Value Date   TSH 5.60 (H) 04/14/2018   Lab Results  Component Value Date   WBC 7.9 04/14/2018   HGB 12.7 04/14/2018   HCT 37.8 04/14/2018   MCV 99.6 04/14/2018   PLT 208.0 04/14/2018   Lab Results  Component Value Date   NA 141 04/14/2018   K 4.5 04/14/2018   CO2 30 04/14/2018   GLUCOSE 104 (H) 04/14/2018   BUN 9 04/14/2018   CREATININE 0.59 04/14/2018   BILITOT 0.8 04/14/2018   ALKPHOS 75 04/14/2018   AST 19 04/14/2018   ALT 20 04/14/2018   PROT 6.4 04/14/2018   ALBUMIN 4.1 04/14/2018   CALCIUM 10.1 04/14/2018   GFR 111.87 04/14/2018   Lab Results  Component Value Date   CHOL 177 04/14/2018   Lab Results  Component Value Date   HDL 62.60 04/14/2018   Lab Results  Component Value Date   LDLCALC 105 (H) 07/06/2017   Lab Results  Component Value Date   TRIG 321.0 (H) 04/14/2018   Lab Results  Component Value Date   CHOLHDL 3 04/14/2018   Lab Results  Component Value Date   HGBA1C 5.7 04/14/2018  Assessment & Plan:   Problem List Items Addressed This Visit    Hypothyroidism    On Levothyroxine, continue to monitor      Relevant Orders   TSH   Hyperlipidemia    Encouraged heart healthy diet, increase exercise, avoid trans fats, consider a krill oil cap daily      Relevant Orders   Lipid panel   MORBID  OBESITY    Encouraged DASH diet, decrease po intake and increase exercise as tolerated. Needs 7-8 hours of sleep nightly. Avoid trans fats, eat small, frequent meals every 4-5 hours with lean proteins, complex carbs and healthy fats. Minimize simple carbs      Essential hypertension    Well controlled, no changes to meds. Encouraged heart healthy diet such as the DASH diet and exercise as tolerated.       Relevant Orders   CBC   Comprehensive metabolic panel   TSH   Constipation    Encouraged increased hydration and fiber in diet. Daily probiotics. If bowels not moving can use MOM 2 tbls po in 4 oz of warm prune juice by mouth every 2-3 days. If no results then repeat in 4 hours with  Dulcolax suppository pr, may repeat again in 4 more hours as needed. Seek care if symptoms worsen. Consider daily Miralax and/or Dulcolax if symptoms persist.       Vitamin D deficiency    Supplement and monitor      Preventative health care    Patient encouraged to maintain heart healthy diet, regular exercise, adequate sleep. Consider daily probiotics. Take medications as prescribed. Sees [facility] for paps      Breast cancer screening   Relevant Orders   MM 3D SCREEN BREAST BILATERAL   Gout    Check uric acid      Relevant Orders   Uric acid   Hyperparathyroidism ([place])    Continue to monitor      Relevant Orders   PTH, Intact and Calcium   Hyperglycemia    hgba1c acceptable, minimize simple carbs. Increase exercise as tolerated.       Relevant Orders   Hemoglobin A1c   Colon polyp    last colonoscopy in 2013 referred for follow up       Other Visit Diagnoses    Needs flu shot    -  Primary   Relevant Orders   Flu Vaccine QUAD 36+ mos IM (Fluarix & Fluzone Quad PF   Colon cancer screening       Relevant Orders   Ambulatory referral to Gastroenterology      I have discontinued Nereida C. Whetsell's promethazine. I am also having her maintain her beclomethasone,  colchicine, ketorolac, atorvastatin, metoprolol succinate, Fluoxetine HCl (PMDD), fentaNYL, oxyCODONE, lisinopril, and Vitamin D (Ergocalciferol).  No orders of the defined types were placed in this encounter.    Penni Homans, MD

## 2018-08-11 LAB — COMPREHENSIVE METABOLIC PANEL
ALBUMIN: 4.1 g/dL (ref 3.5–5.2)
ALT: 18 U/L (ref 0–35)
AST: 24 U/L (ref 0–37)
Alkaline Phosphatase: 58 U/L (ref 39–117)
BUN: 17 mg/dL (ref 6–23)
CHLORIDE: 101 meq/L (ref 96–112)
CO2: 31 meq/L (ref 19–32)
CREATININE: 0.61 mg/dL (ref 0.40–1.20)
Calcium: 10.3 mg/dL (ref 8.4–10.5)
GFR: 107.53 mL/min (ref >60.00)
Glucose, Bld: 92 mg/dL (ref 70–99)
POTASSIUM: 4.1 meq/L (ref 3.5–5.1)
SODIUM: 139 meq/L (ref 135–145)
Total Bilirubin: 0.6 mg/dL (ref 0.2–1.2)
Total Protein: 6.4 g/dL (ref 6.0–8.3)

## 2018-08-11 LAB — CBC
HEMATOCRIT: 36.5 % (ref 36.0–46.0)
Hemoglobin: 12.5 g/dL (ref 12.0–15.0)
MCHC: 34.2 g/dL (ref 30.0–36.0)
MCV: 99.7 fl (ref 78.0–100.0)
PLATELETS: 170 10*3/uL (ref 150.0–400.0)
RBC: 3.66 Mil/uL — AB (ref 3.87–5.11)
RDW: 14.8 % (ref 11.5–15.5)
WBC: 6.7 10*3/uL (ref 4.0–10.5)

## 2018-08-11 LAB — HEMOGLOBIN A1C: Hgb A1c MFr Bld: 5.4 % (ref 4.6–6.5)

## 2018-08-11 LAB — URIC ACID: URIC ACID, SERUM: 8.2 mg/dL — AB (ref 2.4–7.0)

## 2018-08-11 LAB — LIPID PANEL
CHOL/HDL RATIO: 3
Cholesterol: 170 mg/dL (ref 0–200)
HDL: 55.9 mg/dL (ref >39.00)
Triglycerides: 458 mg/dL — ABNORMAL HIGH (ref 0.0–149.0)

## 2018-08-11 LAB — LDL CHOLESTEROL, DIRECT: LDL DIRECT: 55 mg/dL

## 2018-08-11 LAB — PTH, INTACT AND CALCIUM
CALCIUM: 10.2 mg/dL (ref 8.6–10.4)
PTH: 88 pg/mL — AB (ref 14–64)

## 2018-08-11 LAB — TSH: TSH: 3.7 u[IU]/mL (ref 0.35–4.50)

## 2018-08-13 LAB — PAIN MGMT, PROFILE 8 W/CONF, U
6 ACETYLMORPHINE: NEGATIVE ng/mL (ref <10)
ALCOHOL METABOLITES: NEGATIVE ng/mL (ref <500)
AMPHETAMINES: NEGATIVE ng/mL (ref <500)
BENZODIAZEPINES: NEGATIVE ng/mL (ref <100)
Buprenorphine, Urine: NEGATIVE ng/mL (ref <5)
CREATININE: 5.5 mg/dL — AB
Cocaine Metabolite: NEGATIVE ng/mL (ref <150)
MDMA: NEGATIVE ng/mL (ref <500)
Marijuana Metabolite: NEGATIVE ng/mL (ref <20)
NOROXYCODONE: 709 ng/mL — AB (ref <50)
OXYCODONE: 151 ng/mL — AB (ref <50)
OXYCODONE: POSITIVE ng/mL — AB (ref <100)
Opiates: NEGATIVE ng/mL (ref <100)
Oxidant: NEGATIVE ug/mL (ref <200)
Oxymorphone: 50 ng/mL — ABNORMAL HIGH (ref <50)
PH: 6.34 (ref 4.5–9.0)
SPECIFIC GRAVITY: 1.002 — AB (ref >=1.0)

## 2018-08-23 ENCOUNTER — Other Ambulatory Visit: Payer: Self-pay | Admitting: Family Medicine

## 2018-08-23 ENCOUNTER — Telehealth: Payer: Self-pay | Admitting: *Deleted

## 2018-08-23 ENCOUNTER — Telehealth: Payer: Self-pay

## 2018-08-23 MED ORDER — FENTANYL 50 MCG/HR TD PT72: 50.0000 ug | MEDICATED_PATCH | TRANSDERMAL | 0 refills | Status: DC

## 2018-08-23 NOTE — Telephone Encounter (Signed)
PA initiated via Covermymeds; KEY: [id number]. Awaiting determination.

## 2018-08-23 NOTE — Telephone Encounter (Signed)
Received Medical records from [facility]; forwarded to provider/SLS 11/25

## 2018-08-24 ENCOUNTER — Ambulatory Visit (HOSPITAL_BASED_OUTPATIENT_CLINIC_OR_DEPARTMENT_OTHER)
Admission: RE | Admit: 2018-08-24 | Discharge: 2018-08-24 | Disposition: A | Discharge status: Home / Self Care | Payer: BLUE CROSS/BLUE SHIELD | Source: Ambulatory Visit | Attending: Family Medicine | Admitting: Family Medicine

## 2018-08-24 DIAGNOSIS — Z1231 Encounter for screening mammogram for malignant neoplasm of breast: Secondary | ICD-10-CM | POA: Diagnosis not present

## 2018-08-24 DIAGNOSIS — Z1239 Encounter for other screening for malignant neoplasm of breast: Secondary | ICD-10-CM | POA: Diagnosis not present

## 2018-08-24 NOTE — Telephone Encounter (Signed)
Thayer Headings is calling from [facility] and states that she needs to speak to the nurse in regards to if the patient will be taking this medication with another extended release opioid.   Cb# [phone] ext [phone]

## 2018-08-24 NOTE — Telephone Encounter (Signed)
Spoke w/ Thayer Headings- Pt does not take another extended release opioid. She will send information back over to PA team.

## 2018-08-27 NOTE — Telephone Encounter (Signed)
Pt calling to check status. Please advise  °

## 2018-08-30 NOTE — Telephone Encounter (Signed)
PA approved. Effective from 08/23/2018 through 08/22/2019. cont'n

## 2018-08-30 NOTE — Telephone Encounter (Signed)
Still waiting to hear back from insurance.

## 2018-09-07 ENCOUNTER — Other Ambulatory Visit: Payer: Self-pay | Admitting: Family Medicine

## 2018-09-07 MED ORDER — OXYCODONE HCL 15 MG PO TABS: 15.0000 mg | ORAL_TABLET | Freq: Three times a day (TID) | ORAL | 0 refills | Status: DC | PRN

## 2018-09-21 ENCOUNTER — Other Ambulatory Visit: Payer: Self-pay | Admitting: Family Medicine

## 2018-09-27 ENCOUNTER — Other Ambulatory Visit: Payer: Self-pay | Admitting: Family Medicine

## 2018-09-27 MED ORDER — FENTANYL 50 MCG/HR TD PT72: 50.0000 ug | MEDICATED_PATCH | TRANSDERMAL | 0 refills | Status: DC

## 2018-09-27 NOTE — Telephone Encounter (Signed)
Pt called in stated that it is urgent that this is refilled before the end of the year due to a price change and is totally out.    Pharmacy - [facility] on [address]  In [place]  Call back number  [phone]

## 2018-10-01 ENCOUNTER — Other Ambulatory Visit: Payer: Self-pay

## 2018-10-01 NOTE — Telephone Encounter (Signed)
Copied from [place] [id number]. Topic: General - Other >> Oct 01, 2018  4:29 PM Windy Kalata wrote: Reason for CRM:  Patient is concerned that her PatoxyCODONE (ROXICODONE) 15 MG immediate release tablet refill this month will fall on a Saturday and she wants to make sure that it will be filled  to pick up on that Saturday or if possible that Friday, please advise.  Best call back is [phone] this is her cell

## 2018-10-04 MED ORDER — OXYCODONE HCL 15 MG PO TABS: 15.0000 mg | ORAL_TABLET | Freq: Three times a day (TID) | ORAL | 0 refills | Status: DC | PRN

## 2018-10-04 NOTE — Telephone Encounter (Signed)
Requesting:Roxicodine  Contract:yes [id number] risk next screen 02/11/19 Last OV:08/10/18 Next OV:02/08/19 Last Refill:09/07/18  #[id number] Database:   Please advise

## 2018-10-06 MED ORDER — VITAMIN D (ERGOCALCIFEROL) 1.25 MG (50000 UNIT) PO CAPS: ORAL_CAPSULE | ORAL | 0 refills | Status: DC

## 2018-10-06 MED ORDER — FLUOXETINE HCL (PMDD) 10 MG PO TABS: ORAL_TABLET | ORAL | 0 refills | Status: DC

## 2018-10-06 MED ORDER — LISINOPRIL 10 MG PO TABS: 10.0000 mg | ORAL_TABLET | Freq: Every day | ORAL | 0 refills | Status: DC

## 2018-10-06 NOTE — Telephone Encounter (Signed)
Called the patient informed refill done. Also per patient request refilled for 30 days lisinopril/vitamin D/prozac. She may be shopping around for pharmacy's to get better prices, will let PCP know if need to change pharmacy on next refill.

## 2018-10-06 NOTE — Addendum Note (Signed)
Addended by: Sharon Seller B on: 10/06/2018 09:51 AM   Modules accepted: Orders

## 2018-10-06 NOTE — Telephone Encounter (Signed)
Called the patient informed refill done.

## 2018-10-08 ENCOUNTER — Other Ambulatory Visit: Payer: Self-pay | Admitting: Family Medicine

## 2018-10-19 ENCOUNTER — Other Ambulatory Visit: Payer: Self-pay | Admitting: Family Medicine

## 2018-10-19 MED ORDER — FLUOXETINE HCL (PMDD) 10 MG PO TABS: ORAL_TABLET | ORAL | 1 refills | Status: DC

## 2018-10-19 MED ORDER — LISINOPRIL 10 MG PO TABS: 10.0000 mg | ORAL_TABLET | Freq: Every day | ORAL | 0 refills | Status: DC

## 2018-10-19 MED ORDER — VITAMIN D (ERGOCALCIFEROL) 1.25 MG (50000 UNIT) PO CAPS: ORAL_CAPSULE | ORAL | 0 refills | Status: DC

## 2018-10-19 NOTE — Telephone Encounter (Signed)
Copied from [place] [id number]. Topic: Quick Communication - Rx Refill/Question >> Oct 19, 2018  2:50 PM Nils Flack, Marland Kitchen wrote: Medication: Vitamin D, Ergocalciferol, (DRISDOL) 1.25 MG (50000 UT) CAPS capsule, Fluoxetine HCl, PMDD, 10 MG TABS, lisinopril (PRINIVIL,ZESTRIL) 10 MG tablet  Has the patient contacted their pharmacy? No. (Agent: If no, request that the patient contact the pharmacy for the refill.) (Agent: If yes, when and what did the pharmacy advise?) Pt needs to change pharmacies because of the cost of meds.  [facility] but they are too [facility] there.  Please reverse and sent to [facility].  Pt is unsure if she has refills left because she has thrown away bottles.  Preferred Pharmacy (with phone number or street name): [facility] [address], [place]   Agent: Please be advised that RX refills may take up to 3 business days. We ask that you follow-up with your pharmacy.

## 2018-11-02 ENCOUNTER — Other Ambulatory Visit: Payer: Self-pay | Admitting: Family Medicine

## 2018-11-02 MED ORDER — OXYCODONE HCL 15 MG PO TABS: 15.0000 mg | ORAL_TABLET | Freq: Three times a day (TID) | ORAL | 0 refills | Status: DC | PRN

## 2018-11-02 MED ORDER — FENTANYL 50 MCG/HR TD PT72: 10.0000 | MEDICATED_PATCH | TRANSDERMAL | 0 refills | Status: DC

## 2018-11-30 ENCOUNTER — Other Ambulatory Visit: Payer: Self-pay | Admitting: Family Medicine

## 2018-11-30 MED ORDER — OXYCODONE HCL 15 MG PO TABS: 15.0000 mg | ORAL_TABLET | Freq: Three times a day (TID) | ORAL | 0 refills | Status: DC | PRN

## 2018-11-30 MED ORDER — FENTANYL 50 MCG/HR TD PT72: 10.0000 | MEDICATED_PATCH | TRANSDERMAL | 0 refills | Status: DC

## 2018-12-01 ENCOUNTER — Other Ambulatory Visit: Payer: Self-pay | Admitting: Family Medicine

## 2018-12-05 IMAGING — CR DG LUMBAR SPINE COMPLETE 4+V
5 series · 5 of 5 positions shown · non-contrast
Comparison: None.

CLINICAL DATA: Bilateral lower back pain, bilateral lower extremity
pain and weakness for a few months. No reported injury.

EXAM:
LUMBAR SPINE - COMPLETE 4+ VIEW

[t l-spine a.p.]
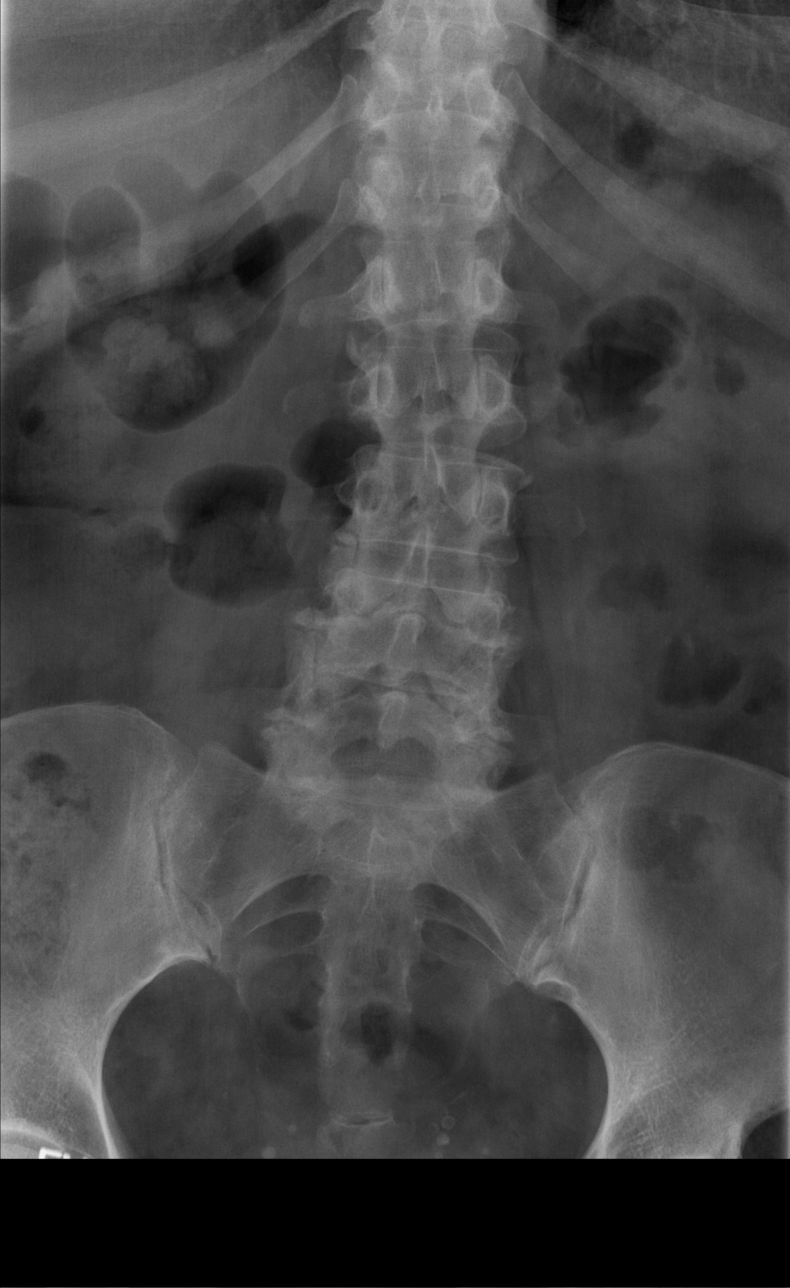

[t l-spine oblique exposure (1 of 2)]
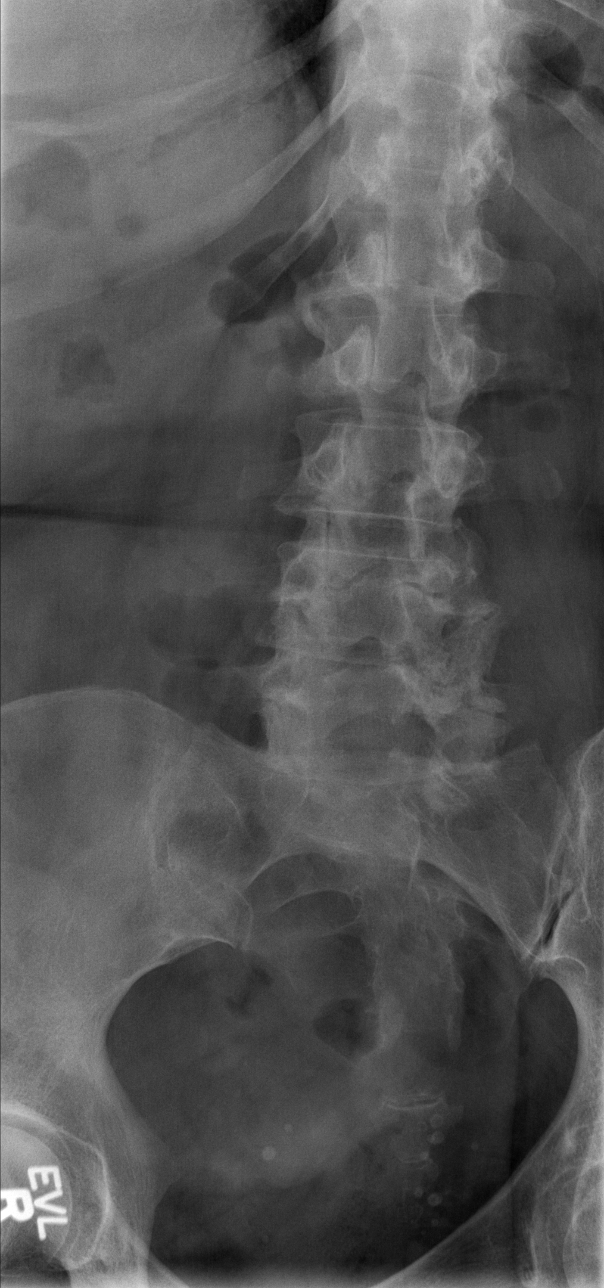

[t l-spine oblique exposure (2 of 2)]
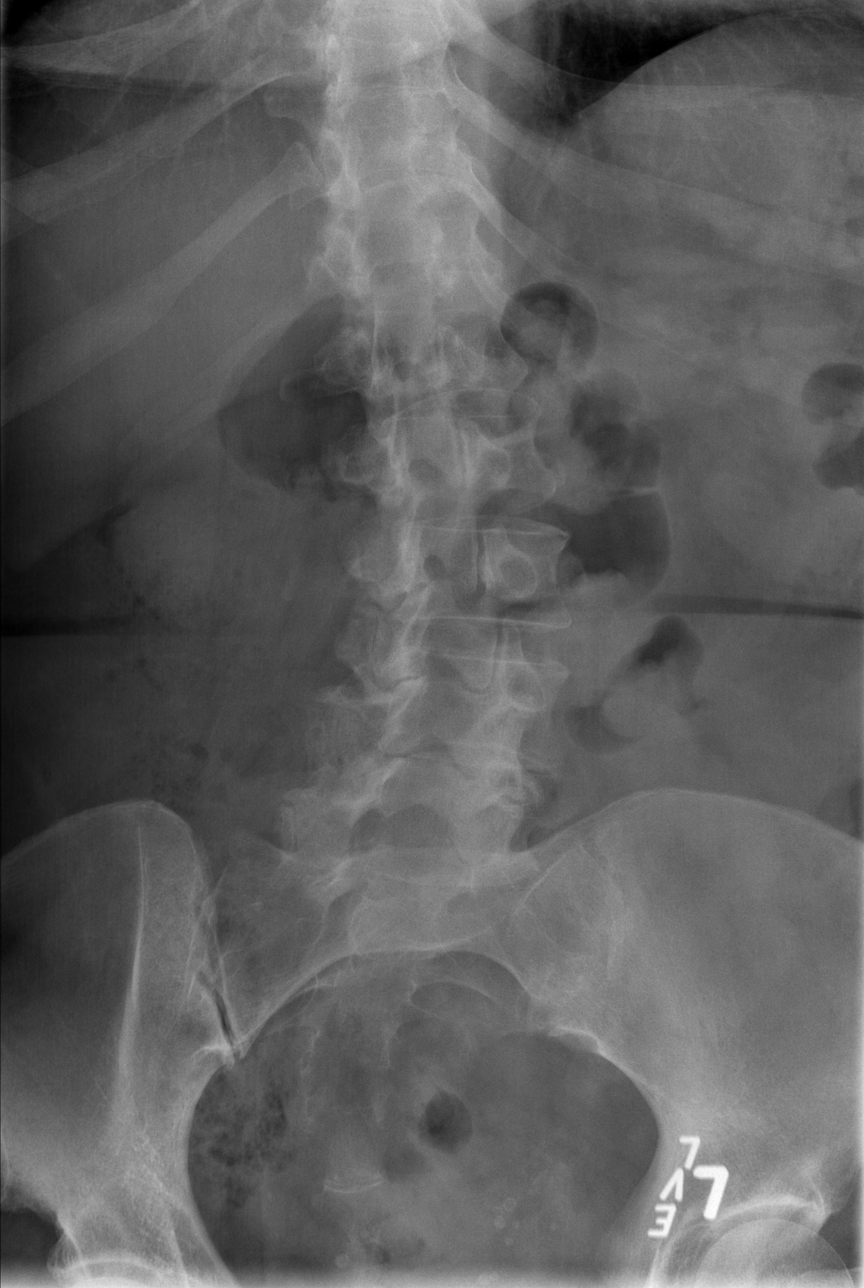

[t l-spine lat]
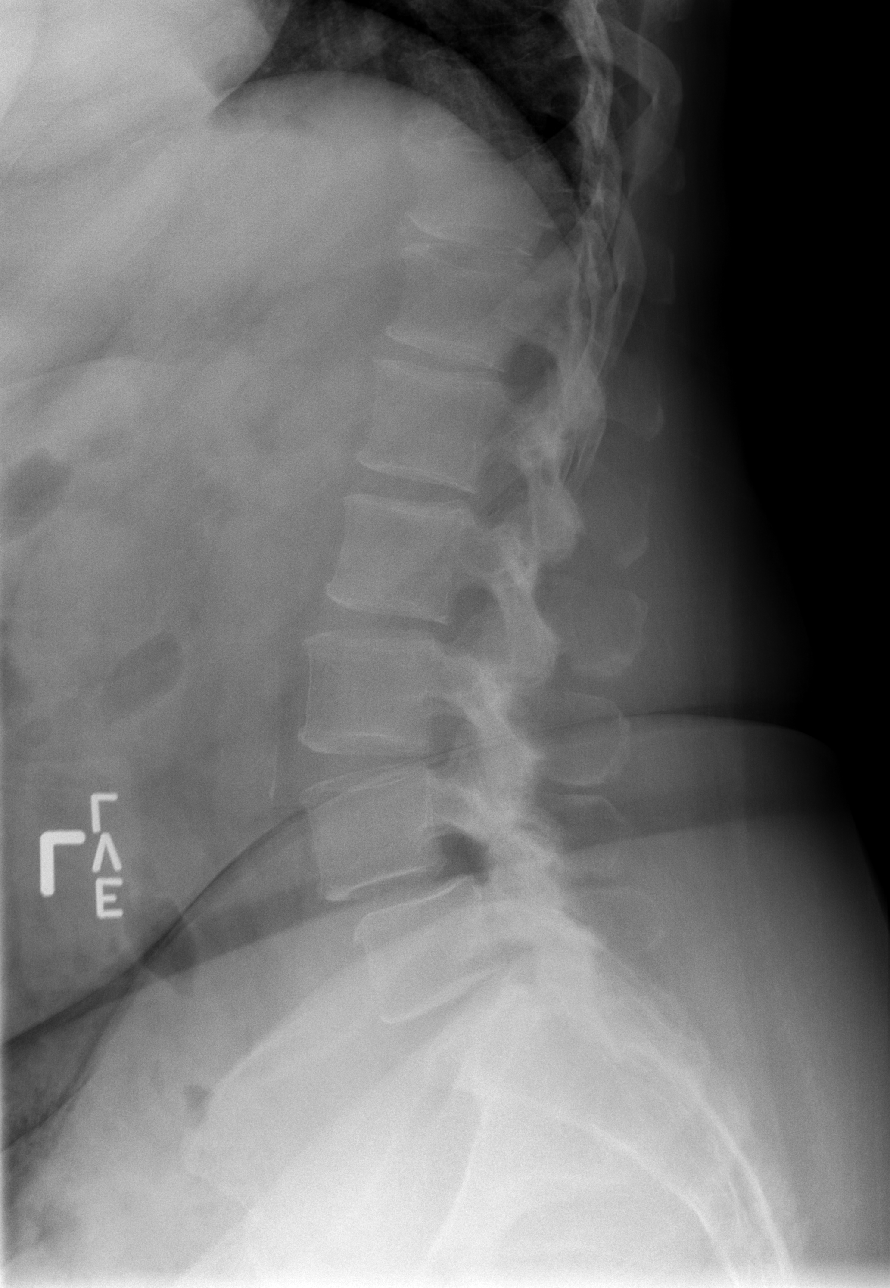

[t l-spine l5-s1 spot]
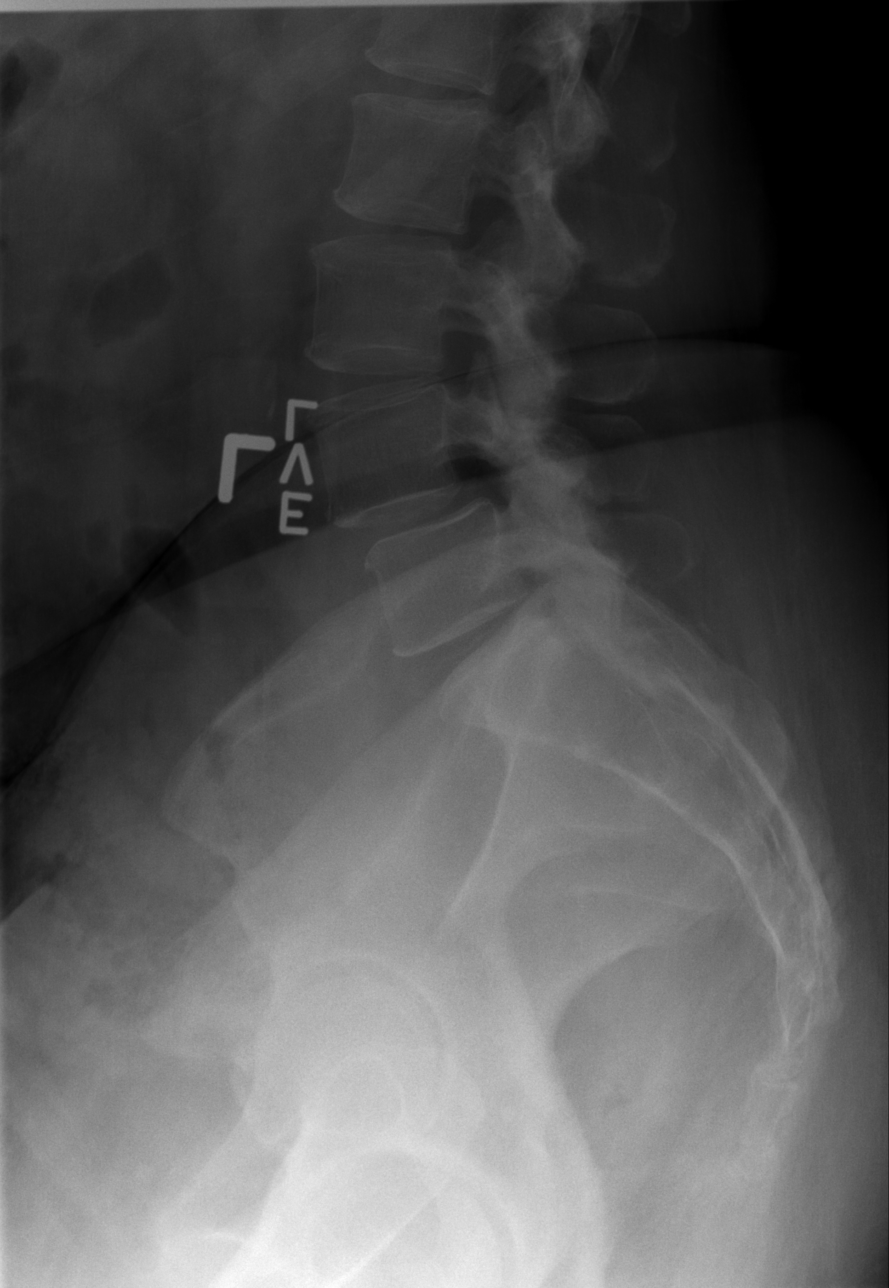

[5 of 5 positions shown; findings below may reference images not displayed]

FINDINGS: This report assumes 5 non rib-bearing lumbar vertebrae. Mild
levocurvature of the lumbar spine.

Lumbar vertebral body heights are preserved, with no fracture.

Mild-to-moderate multilevel lumbar degenerative disc disease, most
prominent at L4-5. There is 3 mm retrolisthesis at L2-3 and there is
8 mm anterolisthesis at L4-5. Moderate facet arthropathy bilaterally
in the lower lumbar spine. No aggressive appearing focal osseous
lesions. Abdominal aortic atherosclerosis.
IMPRESSION: 1. Mild-to-moderate multilevel lumbar degenerative disc disease,
most prominent at L4-5.
2. Multilevel lumbar spondylolisthesis, most prominent at L4-5.
3. Moderate bilateral lower lumbar facet arthropathy.

## 2018-12-29 ENCOUNTER — Other Ambulatory Visit: Payer: Self-pay | Admitting: Family Medicine

## 2018-12-29 MED ORDER — FENTANYL 50 MCG/HR TD PT72: 10.0000 | MEDICATED_PATCH | TRANSDERMAL | 0 refills | Status: DC

## 2018-12-29 MED ORDER — OXYCODONE HCL 15 MG PO TABS: 15.0000 mg | ORAL_TABLET | Freq: Three times a day (TID) | ORAL | 0 refills | Status: DC | PRN

## 2018-12-29 MED ORDER — FLUOXETINE HCL (PMDD) 10 MG PO TABS: ORAL_TABLET | ORAL | 1 refills | Status: DC

## 2018-12-29 MED ORDER — METOPROLOL SUCCINATE ER 25 MG PO TB24: 25.0000 mg | ORAL_TABLET | Freq: Every day | ORAL | 5 refills | Status: DC

## 2018-12-29 MED ORDER — LISINOPRIL 10 MG PO TABS: 10.0000 mg | ORAL_TABLET | Freq: Every day | ORAL | 0 refills | Status: DC

## 2018-12-29 MED ORDER — VITAMIN D (ERGOCALCIFEROL) 1.25 MG (50000 UNIT) PO CAPS: ORAL_CAPSULE | ORAL | 0 refills | Status: DC

## 2018-12-29 NOTE — Telephone Encounter (Signed)
Please review refill request 

## 2018-12-31 ENCOUNTER — Other Ambulatory Visit: Payer: Self-pay | Admitting: Family Medicine

## 2019-01-26 ENCOUNTER — Other Ambulatory Visit: Payer: Self-pay | Admitting: Family Medicine

## 2019-01-26 MED ORDER — OXYCODONE HCL 15 MG PO TABS: 15.0000 mg | ORAL_TABLET | Freq: Three times a day (TID) | ORAL | 0 refills | Status: DC | PRN

## 2019-01-26 MED ORDER — FENTANYL 50 MCG/HR TD PT72: 10.0000 | MEDICATED_PATCH | TRANSDERMAL | 0 refills | Status: DC

## 2019-01-26 NOTE — Telephone Encounter (Signed)
Requesting: Fentanyl Contract: 01/04/2018 UDS: 08/13/2018, moderate risk, next screen 02/11/2019 Last OV: 08/10/2018 Next OV: 02/08/2019 Last Refill: 12/29/2018, #10 patch--0 RF Database:   Requesting: Oxycodone Contract: 01/04/2018 UDS:08/13/2018, moderate risk, next screen 02/11/2019 Last OV:08/10/2018 Next OV: 02/08/2019 Last Refill: 12/29/2018, #75--0 RF Database:   Please advise

## 2019-02-08 ENCOUNTER — Ambulatory Visit (INDEPENDENT_AMBULATORY_CARE_PROVIDER_SITE_OTHER): Payer: BLUE CROSS/BLUE SHIELD | Admitting: Family Medicine

## 2019-02-08 ENCOUNTER — Other Ambulatory Visit: Payer: Self-pay

## 2019-02-08 VITALS — BP 119/65 | HR 73

## 2019-02-08 DIAGNOSIS — E559 Vitamin D deficiency, unspecified: Secondary | ICD-10-CM

## 2019-02-08 DIAGNOSIS — I10 Essential (primary) hypertension: Secondary | ICD-10-CM

## 2019-02-08 DIAGNOSIS — R739 Hyperglycemia, unspecified: Secondary | ICD-10-CM

## 2019-02-08 DIAGNOSIS — E039 Hypothyroidism, unspecified: Secondary | ICD-10-CM

## 2019-02-08 DIAGNOSIS — M545 Low back pain, unspecified: Secondary | ICD-10-CM

## 2019-02-08 DIAGNOSIS — Z79899 Other long term (current) drug therapy: Secondary | ICD-10-CM | POA: Diagnosis not present

## 2019-02-08 DIAGNOSIS — E785 Hyperlipidemia, unspecified: Secondary | ICD-10-CM | POA: Diagnosis not present

## 2019-02-08 NOTE — Assessment & Plan Note (Signed)
Doing well on current meds no acute concerns. No change to therapy

## 2019-02-08 NOTE — Addendum Note (Signed)
Addended by: Magdalene Molly A on: 02/08/2019 02:50 PM   Modules accepted: Orders

## 2019-02-08 NOTE — Assessment & Plan Note (Signed)
Supplement and monitor 

## 2019-02-08 NOTE — Assessment & Plan Note (Signed)
On Levothyroxine, continue to monitor 

## 2019-02-08 NOTE — Progress Notes (Signed)
Virtual Visit via Video Note  I connected with [name] [name] on 02/08/19 at  2:00 PM EDT by a video enabled telemedicine application and verified that I am speaking with the correct person using two identifiers.  Location: Patient: home Provider: home   I discussed the limitations of evaluation and management by telemedicine and the availability of in person appointments. The patient expressed understanding and agreed to proceed.  Magdalene Molly, CMA was able to get patient set up on video visit    Subjective:    Patient ID: [name] [name], female    DOB: 11/14/1960, 58 y.o.   MRN: [id number]  No chief complaint on file.   HPI Patient is in today for follow up on chronic medical concerns including hypothyroidism, hyperlipidemia, hypertension and chronic pain.  She is feeling well today.  She is able to quarantine in place.  Goes out infrequently to the grocery store and uses her mask and social distancing.  No recent febrile illness or acute concerns.  Her pain medications continue to add adequate pain relief so that she can take care of her activities of daily living and care for herself. Denies CP/palp/SOB/HA/congestion/fevers/GI or GU c/o. Taking meds as prescribed  Past Medical History:  Diagnosis Date  . Acute foot pain, right 01/13/2017  . Acute upper respiratory infections of unspecified site 06/12/2014  . Anxiety and depression    chronic  . Breast cancer screening 09/05/2016  . Cervical cancer screening 09/05/2016  . Chronic back pain   . Depression with anxiety 08/23/2008   Qualifier: Diagnosis of  By: Niel Hummer MD, Lorinda Creed   . Gout 05/05/2017  . History of fibromyalgia    suspected   . Hypertension   . Insomnia   . Knee pain, right 06/24/2015  . Morbid obesity ([place])   . Ovarian cyst, bilateral   . Preventative health care 06/24/2015  . PTSD (post-traumatic stress disorder)   . Vitamin D deficiency 06/24/2015    Past Surgical History:  Procedure Laterality Date  .  OVARIAN CYST REMOVAL      Family History  Problem Relation Age of Onset  . Rheum arthritis Father   . Diabetes Father   . Congestive Heart Failure Father   . Hypertension Father   . Stroke Father   . Cancer Maternal Uncle 4       colon  . Hip fracture Maternal Grandmother   . COPD Maternal Grandfather 87       colon  . Lymphoma Paternal Grandmother   . ADD / ADHD Neg Hx   . Depression Neg Hx   . Alcohol abuse Neg Hx   . Drug abuse Neg Hx     Social History   Socioeconomic History  . Marital status: Divorced    Spouse name: Not on file  . Number of children: Not on file  . Years of education: Not on file  . Highest education level: Not on file  Occupational History  . Not on file  Social Needs  . Financial resource strain: Not on file  . Food insecurity:    Worry: Not on file    Inability: Not on file  . Transportation needs:    Medical: Not on file    Non-medical: Not on file  Tobacco Use  . Smoking status: Former Smoker    Last attempt to quit: 04/04/1989    Years since quitting: 29.8  . Smokeless tobacco: Never Used  Substance and Sexual Activity  . Alcohol  use: Yes    Comment: occassionally- 2 glassses of wine 2x/month  . Drug use: No    Comment: take meds as prescribed  . Sexual activity: Not on file  Lifestyle  . Physical activity:    Days per week: Not on file    Minutes per session: Not on file  . Stress: Not on file  Relationships  . Social connections:    Talks on phone: Not on file    Gets together: Not on file    Attends religious service: Not on file    Active member of club or organization: Not on file    Attends meetings of clubs or organizations: Not on file    Relationship status: Not on file  . Intimate partner violence:    Fear of current or ex partner: Not on file    Emotionally abused: Not on file    Physically abused: Not on file    Forced sexual activity: Not on file  Other Topics Concern  . Not on file  Social History  Narrative  . Not on file    Outpatient Medications Prior to Visit  Medication Sig Dispense Refill  . atorvastatin (LIPITOR) 10 MG tablet TAKE 1 TABLET BY MOUTH THREE TIMES WEEKLY AT BEDTIME 15 tablet 5  . beclomethasone (BECONASE-AQ) 42 MCG/SPRAY nasal spray Place 1 spray into both nostrils 2 (two) times daily. Dose is for each nostril. 25 g 1  . colchicine 0.6 MG tablet 2 tabs po once then 1 tab po q 2 hours prn pain til pain gone, max of 6 tabs in 24 hours or intolerable diarrhea (Patient not taking: Reported on 08/10/2018) 6 tablet 1  . fentaNYL (DURAGESIC) 50 MCG/HR Place 10 patches onto the skin every 3 (three) days. May fill on 10/20/17 or later 10 patch 0  . Fluoxetine HCl, PMDD, 10 MG TABS TAKE 1 TO 2 TABLETS EVERY DAY 180 tablet 1  . ketorolac (TORADOL) 10 MG tablet Take 1 tablet (10 mg total) by mouth 2 (two) times daily as needed. (Patient not taking: Reported on 08/10/2018) 10 tablet 0  . lisinopril (PRINIVIL,ZESTRIL) 10 MG tablet TAKE ONE TABLET BY MOUTH ONE TIME DAILY 90 tablet 1  . metoprolol succinate (TOPROL-XL) 25 MG 24 hr tablet Take 1 tablet (25 mg total) by mouth daily. 30 tablet 5  . ondansetron (ZOFRAN) 4 MG tablet Take 1 tablet (4 mg total) by mouth every 8 (eight) hours as needed for nausea or vomiting. 60 tablet 2  . oxyCODONE (ROXICODONE) 15 MG immediate release tablet Take 1 tablet (15 mg total) by mouth every 8 (eight) hours as needed for pain. 75 tablet 0  . Vitamin D, Ergocalciferol, (DRISDOL) 1.25 MG (50000 UT) CAPS capsule Take 1 tablet weekly 12 capsule 0  . lisinopril (PRINIVIL,ZESTRIL) 10 MG tablet Take 1 tablet (10 mg total) by mouth daily. 30 tablet 0  . Vitamin D, Ergocalciferol, (DRISDOL) 1.25 MG (50000 UT) CAPS capsule TAKE ONE CAPSULE BY MOUTH EVERY WEEK 12 capsule 0   No facility-administered medications prior to visit.     Allergies  Allergen Reactions  . Cymbalta [Duloxetine Hcl] Other (See Comments)    Headaches  . Fiorinal  [Butalbital-Aspirin-Caffeine] Other (See Comments)    Mental Clarity.    Review of Systems  Constitutional: Negative for fever and malaise/fatigue.  HENT: Negative for congestion.   Eyes: Negative for blurred vision.  Respiratory: Negative for shortness of breath.   Cardiovascular: Negative for chest pain, palpitations and leg swelling.  Gastrointestinal: Negative for abdominal pain, blood in stool and nausea.  Genitourinary: Negative for dysuria and frequency.  Musculoskeletal: Positive for back pain and joint pain. Negative for falls.  Skin: Negative for rash.  Neurological: Negative for dizziness, loss of consciousness and headaches.  Endo/Heme/Allergies: Negative for environmental allergies.  Psychiatric/Behavioral: Negative for depression. The patient is not nervous/anxious.        Objective:    Physical Exam Constitutional:      Appearance: Normal appearance. She is not ill-appearing.  HENT:     Nose: Nose normal.  Eyes:     General:        Right eye: No discharge.        Left eye: No discharge.  Pulmonary:     Effort: Pulmonary effort is normal.  Neurological:     Mental Status: She is alert and oriented to person, place, and time.  Psychiatric:        Mood and Affect: Mood normal.        Behavior: Behavior normal.     BP 119/65   Pulse 73  Wt Readings from Last 3 Encounters:  08/10/18 238 lb (108 kg)  04/06/18 233 lb (105.7 kg)  10/06/17 228 lb (103.4 kg)    Diabetic Foot Exam - Simple   No data filed     Lab Results  Component Value Date   WBC 6.7 08/10/2018   HGB 12.5 08/10/2018   HCT 36.5 08/10/2018   PLT 170.0 08/10/2018   GLUCOSE 92 08/10/2018   CHOL 170 08/10/2018   TRIG (H) 08/10/2018    458.0 Triglyceride is over 400; calculations on Lipids are invalid.   HDL 55.90 08/10/2018   LDLDIRECT 55.0 08/10/2018   LDLCALC 105 (H) 07/06/2017   ALT 18 08/10/2018   AST 24 08/10/2018   NA 139 08/10/2018   K 4.1 08/10/2018   CL 101 08/10/2018    CREATININE 0.61 08/10/2018   BUN 17 08/10/2018   CO2 31 08/10/2018   TSH 3.70 08/10/2018   HGBA1C 5.4 08/10/2018    Lab Results  Component Value Date   TSH 3.70 08/10/2018   Lab Results  Component Value Date   WBC 6.7 08/10/2018   HGB 12.5 08/10/2018   HCT 36.5 08/10/2018   MCV 99.7 08/10/2018   PLT 170.0 08/10/2018   Lab Results  Component Value Date   NA 139 08/10/2018   K 4.1 08/10/2018   CO2 31 08/10/2018   GLUCOSE 92 08/10/2018   BUN 17 08/10/2018   CREATININE 0.61 08/10/2018   BILITOT 0.6 08/10/2018   ALKPHOS 58 08/10/2018   AST 24 08/10/2018   ALT 18 08/10/2018   PROT 6.4 08/10/2018   ALBUMIN 4.1 08/10/2018   CALCIUM 10.2 08/10/2018   GFR 107.53 08/10/2018   Lab Results  Component Value Date   CHOL 170 08/10/2018   Lab Results  Component Value Date   HDL 55.90 08/10/2018   Lab Results  Component Value Date   LDLCALC 105 (H) 07/06/2017   Lab Results  Component Value Date   TRIG (H) 08/10/2018    458.0 Triglyceride is over 400; calculations on Lipids are invalid.   Lab Results  Component Value Date   CHOLHDL 3 08/10/2018   Lab Results  Component Value Date   HGBA1C 5.4 08/10/2018       Assessment & Plan:   Problem List Items Addressed This Visit    Hypothyroidism    On Levothyroxine, continue to monitor  Hyperlipidemia    Encouraged heart healthy diet, increase exercise, avoid trans fats, consider a krill oil cap daily      Essential hypertension    Well controlled, no changes to meds. Encouraged heart healthy diet such as the DASH diet and exercise as tolerated.       Low back pain    Doing well on current meds no acute concerns. No change to therapy      Vitamin D deficiency    Supplement and monitor          I am having [name] C. Landess "Candace" maintain her beclomethasone, colchicine, ketorolac, atorvastatin, ondansetron, metoprolol succinate, Vitamin D (Ergocalciferol), Fluoxetine HCl (PMDD), lisinopril, fentaNYL,  and oxyCODONE.  No orders of the defined types were placed in this encounter.     I discussed the assessment and treatment plan with the patient. The patient was provided an opportunity to ask questions and all were answered. The patient agreed with the plan and demonstrated an understanding of the instructions.   The patient was advised to call back or seek an in-person evaluation if the symptoms worsen or if the condition fails to improve as anticipated.  I provided 25 minutes of non-face-to-face time during this encounter.   Penni Homans, MD ;

## 2019-02-08 NOTE — Assessment & Plan Note (Signed)
Well controlled, no changes to meds. Encouraged heart healthy diet such as the DASH diet and exercise as tolerated.  °

## 2019-02-08 NOTE — Assessment & Plan Note (Signed)
Encouraged heart healthy diet, increase exercise, avoid trans fats, consider a krill oil cap daily 

## 2019-02-15 ENCOUNTER — Other Ambulatory Visit: Payer: Self-pay

## 2019-02-15 ENCOUNTER — Other Ambulatory Visit (INDEPENDENT_AMBULATORY_CARE_PROVIDER_SITE_OTHER): Payer: BLUE CROSS/BLUE SHIELD

## 2019-02-15 DIAGNOSIS — E039 Hypothyroidism, unspecified: Secondary | ICD-10-CM | POA: Diagnosis not present

## 2019-02-15 DIAGNOSIS — I10 Essential (primary) hypertension: Secondary | ICD-10-CM

## 2019-02-15 DIAGNOSIS — Z79899 Other long term (current) drug therapy: Secondary | ICD-10-CM | POA: Diagnosis not present

## 2019-02-15 DIAGNOSIS — R739 Hyperglycemia, unspecified: Secondary | ICD-10-CM

## 2019-02-15 DIAGNOSIS — E559 Vitamin D deficiency, unspecified: Secondary | ICD-10-CM

## 2019-02-15 DIAGNOSIS — E785 Hyperlipidemia, unspecified: Secondary | ICD-10-CM

## 2019-02-15 LAB — CBC
HCT: 36.2 % (ref 36.0–46.0)
Hemoglobin: 12.1 g/dL (ref 12.0–15.0)
MCHC: 33.5 g/dL (ref 30.0–36.0)
MCV: 99.2 fl (ref 78.0–100.0)
Platelets: 212 10*3/uL (ref 150.0–400.0)
RBC: 3.64 Mil/uL — ABNORMAL LOW (ref 3.87–5.11)
RDW: 14.5 % (ref 11.5–15.5)
WBC: 7 10*3/uL (ref 4.0–10.5)

## 2019-02-15 LAB — COMPREHENSIVE METABOLIC PANEL
ALT: 24 U/L (ref 0–35)
AST: 20 U/L (ref 0–37)
Albumin: 4 g/dL (ref 3.5–5.2)
Alkaline Phosphatase: 66 U/L (ref 39–117)
BUN: 12 mg/dL (ref 6–23)
CO2: 30 mEq/L (ref 19–32)
Calcium: 10.5 mg/dL (ref 8.4–10.5)
Chloride: 101 mEq/L (ref 96–112)
Creatinine, Ser: 0.54 mg/dL (ref 0.40–1.20)
GFR: 116.24 mL/min (ref >60.00)
Glucose, Bld: 98 mg/dL (ref 70–99)
Potassium: 5 mEq/L (ref 3.5–5.1)
Sodium: 138 mEq/L (ref 135–145)
Total Bilirubin: 0.5 mg/dL (ref 0.2–1.2)
Total Protein: 6.5 g/dL (ref 6.0–8.3)

## 2019-02-15 LAB — LDL CHOLESTEROL, DIRECT: Direct LDL: 106 mg/dL

## 2019-02-15 LAB — LIPID PANEL
Cholesterol: 225 mg/dL — ABNORMAL HIGH (ref 0–200)
HDL: 54 mg/dL (ref >39.00)
NonHDL: 170.95
Total CHOL/HDL Ratio: 4
Triglycerides: 374 mg/dL — ABNORMAL HIGH (ref 0.0–149.0)
VLDL: 74.8 mg/dL — ABNORMAL HIGH (ref 0.0–40.0)

## 2019-02-15 LAB — HEMOGLOBIN A1C: Hgb A1c MFr Bld: 5.7 % (ref 4.6–6.5)

## 2019-02-15 LAB — TSH: TSH: 3.02 u[IU]/mL (ref 0.35–4.50)

## 2019-02-15 LAB — VITAMIN D 25 HYDROXY (VIT D DEFICIENCY, FRACTURES): VITD: 35.26 ng/mL (ref 30.00–100.00)

## 2019-02-17 LAB — PAIN MGMT, PROFILE 8 W/CONF, U
6 Acetylmorphine: NEGATIVE ng/mL
Alcohol Metabolites: NEGATIVE ng/mL (ref <500)
Amphetamines: NEGATIVE ng/mL
Benzodiazepines: NEGATIVE ng/mL
Buprenorphine, Urine: NEGATIVE ng/mL
Cocaine Metabolite: NEGATIVE ng/mL
Creatinine: 7.5 mg/dL
MDMA: NEGATIVE ng/mL
Marijuana Metabolite: NEGATIVE ng/mL
Noroxycodone: 983 ng/mL
Opiates: NEGATIVE ng/mL
Oxidant: NEGATIVE ug/mL
Oxycodone: 171 ng/mL
Oxycodone: POSITIVE ng/mL
Oxymorphone: 70 ng/mL
Specific Gravity: 1.002 — ABNORMAL LOW (ref >=1.0)
pH: 6.3 (ref 4.5–9.0)

## 2019-02-18 ENCOUNTER — Other Ambulatory Visit: Payer: Self-pay | Admitting: Family Medicine

## 2019-02-21 ENCOUNTER — Other Ambulatory Visit: Payer: Self-pay | Admitting: Family Medicine

## 2019-02-21 MED ORDER — OXYCODONE HCL 15 MG PO TABS: 15.0000 mg | ORAL_TABLET | Freq: Three times a day (TID) | ORAL | 0 refills | Status: DC | PRN

## 2019-02-22 NOTE — Telephone Encounter (Signed)
Please check rx

## 2019-02-22 NOTE — Telephone Encounter (Signed)
Requesting: Oxycodone Contract: 01/04/2018 UDS:02/15/2019, moderate risk Last OV: 02/08/2019 Next OV: 08/11/2019 Last Refill: 02/21/2019, #75-0 RF Database:   Please advise

## 2019-02-23 MED ORDER — ATORVASTATIN CALCIUM 20 MG PO TABS: 20.0000 mg | ORAL_TABLET | Freq: Every day | ORAL | 3 refills | Status: DC

## 2019-02-23 NOTE — Addendum Note (Signed)
Addended by: Magdalene Molly A on: 02/23/2019 10:24 AM   Modules accepted: Orders

## 2019-03-15 ENCOUNTER — Other Ambulatory Visit: Payer: Self-pay | Admitting: Family Medicine

## 2019-03-16 MED ORDER — FENTANYL 50 MCG/HR TD PT72: 10.0000 | MEDICATED_PATCH | TRANSDERMAL | 0 refills | Status: DC

## 2019-03-16 MED ORDER — OXYCODONE HCL 15 MG PO TABS: 15.0000 mg | ORAL_TABLET | Freq: Three times a day (TID) | ORAL | 0 refills | Status: DC | PRN

## 2019-03-16 NOTE — Telephone Encounter (Signed)
Will you refill in PCP absence?

## 2019-03-21 ENCOUNTER — Encounter: Payer: Self-pay | Admitting: Family Medicine

## 2019-04-05 ENCOUNTER — Other Ambulatory Visit: Payer: Self-pay | Admitting: Family Medicine

## 2019-04-05 MED ORDER — OXYCODONE HCL 15 MG PO TABS: 15.0000 mg | ORAL_TABLET | Freq: Three times a day (TID) | ORAL | 0 refills | Status: DC | PRN

## 2019-04-14 ENCOUNTER — Other Ambulatory Visit: Payer: Self-pay | Admitting: Family Medicine

## 2019-05-02 ENCOUNTER — Other Ambulatory Visit: Payer: Self-pay | Admitting: Family Medicine

## 2019-05-02 NOTE — Telephone Encounter (Signed)
Patient would like a refill on the following medications and have them sent to [facility] preferred [facility] [address]:  1.  oxyCODONE (ROXICODONE) 15 MG immediate release tablet   2.  fentaNYL (DURAGESIC) 50 MCG/HR

## 2019-05-03 ENCOUNTER — Other Ambulatory Visit: Payer: Self-pay | Admitting: Family Medicine

## 2019-05-03 ENCOUNTER — Telehealth: Payer: Self-pay | Admitting: *Deleted

## 2019-05-03 MED ORDER — FENTANYL 50 MCG/HR TD PT72: 10.0000 | MEDICATED_PATCH | TRANSDERMAL | 0 refills | Status: DC

## 2019-05-03 MED ORDER — OXYCODONE HCL 15 MG PO TABS: 15.0000 mg | ORAL_TABLET | Freq: Three times a day (TID) | ORAL | 0 refills | Status: DC | PRN

## 2019-05-03 NOTE — Telephone Encounter (Signed)
Copied from [place] [id number]. Topic: General - Other >> May 02, 2019  5:22 PM Percell Belt A wrote: Reason for CRM: pt is wanted to change all of her meds to a different pharmacy , [facility] [address].   Pt stated that she will save a lot of money by using them . >> May 02, 2019  5:54 PM Pilar Grammes F wrote: Patient would like to cancel the previous request to have her meds moved.

## 2019-05-03 NOTE — Telephone Encounter (Signed)
Requesting:Fentanyl & Roxicodone  Contract:yes UDS:low risk next screen 08/18/19 Last OV:02/08/19 Next OV:08/11/19 Last Refill: Fentanyl 03/16/19 #10 patches-0rf Roxicodone 04/05/19  #75-0rf Database:   Please advise

## 2019-05-04 ENCOUNTER — Other Ambulatory Visit: Payer: Self-pay | Admitting: Family Medicine

## 2019-05-04 MED ORDER — OXYCODONE HCL 15 MG PO TABS: 15.0000 mg | ORAL_TABLET | Freq: Three times a day (TID) | ORAL | 0 refills | Status: DC | PRN

## 2019-05-04 MED ORDER — FENTANYL 50 MCG/HR TD PT72: 10.0000 | MEDICATED_PATCH | TRANSDERMAL | 0 refills | Status: DC

## 2019-05-04 NOTE — Telephone Encounter (Signed)
Pharmacy called in stating they are needing clarification on medication directions for fentanyl patches. Please advise and resend electronically.

## 2019-05-04 NOTE — Telephone Encounter (Signed)
FYI

## 2019-05-04 NOTE — Telephone Encounter (Signed)
Sent in prescriptions. Cancel old prescription

## 2019-05-06 MED ORDER — FENTANYL 50 MCG/HR TD PT72: 1.0000 | MEDICATED_PATCH | TRANSDERMAL | 0 refills | Status: DC

## 2019-05-06 NOTE — Telephone Encounter (Signed)
Can you resend these they had the wrong sig on them. I have corrected the sig. Please advise

## 2019-05-17 ENCOUNTER — Other Ambulatory Visit: Payer: Self-pay | Admitting: Family Medicine

## 2019-05-17 NOTE — Telephone Encounter (Signed)
Just confirm we should send this  I feel like this has happened recently before

## 2019-05-17 NOTE — Telephone Encounter (Signed)
Requested medications are due for refill today?  No  Requested medications are on the active medication list?  Yes  Last refill- 05/06/2019 #10 patches, no refills  Future visit scheduled?  Yes  Notes to clinic:  Patient requesting [facility] [address] in [place].   Requested Prescriptions  Pending Prescriptions Disp Refills   fentaNYL (DURAGESIC) 50 MCG/HR 10 patch 0    Sig: Place 1 patch onto the skin every 3 (three) days.     Not Delegated - Analgesics:  Opioid Agonists Failed - 05/17/2019  3:12 PM      Failed - This refill cannot be delegated      Passed - Urine Drug Screen completed in last 360 days.      Passed - Valid encounter within last 6 months    Recent Outpatient Visits          3 months ago High risk medication use   [facility] [place], MD   9 months ago Preventative health care   [place] [facility] [place], MD   1 year ago Chest pressure   [place] [facility] [place], MD   1 year ago High risk medication use   [facility] [place], MD   1 year ago Hyperglycemia   [facility] [place], MD      Future Appointments            In 2 months Mosie Lukes, MD [facility] [place]

## 2019-05-17 NOTE — Telephone Encounter (Signed)
Copied from [place] [id number]. Topic: Quick Communication - Rx Refill/Question >> May 17, 2019  2:57 PM Rainey Pines A wrote: Medication: fentaNYL ([place]) 50 MCG/HR (Patient would like all medications transferred to this pharmacy and stated that she needs a refill on this medication now.)  Has the patient contacted their pharmacy? Yes (Agent: If no, request that the patient contact the pharmacy for the refill.) (Agent: If yes, when and what did the pharmacy advise?)Contact PCP  Preferred Pharmacy (with phone number or street name): [facility] [address] [place], [place] - [address] [phone] (Phone) [phone] (Fax)    Agent: Please be advised that RX refills may take up to 3 business days. We ask that you follow-up with your pharmacy.

## 2019-05-18 NOTE — Telephone Encounter (Signed)
Changed pharmacy to the [facility] as patient requested. Patient should not ned a refill until the end of the month.

## 2019-05-19 ENCOUNTER — Telehealth: Payer: Self-pay | Admitting: *Deleted

## 2019-05-19 NOTE — Telephone Encounter (Signed)
Patient has been self tapering on her fentanyl 50mg  to 1 patch every 4-5 days.  She wants to go down a level (mg) because it will be cheaper.  Since going up on the oxycodone she has forgot to use patches, she did not require as much. She also would like for the next refill to be sent to [facility] because it cheaper.

## 2019-05-19 NOTE — Telephone Encounter (Signed)
Copied from [place] [id number]. Topic: General - Other >> May 17, 2019  3:01 PM Rainey Pines A wrote: Patient would like a callback from Dr. Raeanne Gathers nurse in regards to her medication fentanyl.

## 2019-05-19 NOTE — Telephone Encounter (Signed)
Not sure what she needs. We really needs an appt to discuss her options. If her oxycodone is due she can have a refill and I am willing to write her a prescriptionf ro Fentanyl 25 mcg patch, 1 patch topically every 72 hours. Disp #10 if it is time. And get her an appt in the next 2 weeks.

## 2019-05-20 NOTE — Telephone Encounter (Signed)
There is a 37.5 patch but I think the 25 is cheaper.

## 2019-05-20 NOTE — Telephone Encounter (Signed)
FYI  Advised patient of note.  She has changed her mind because she thought the next dose down was like 37.5mg .  Also advised her about appointment and she stated that she is currently having financial problems and cannot get in for an appointment.  She will keep her appointment in November.  She was also advised that she must be seen every 3 months for pain meds.   Pharmacy changed to [facility].

## 2019-05-23 ENCOUNTER — Other Ambulatory Visit: Payer: Self-pay | Admitting: Family Medicine

## 2019-05-24 ENCOUNTER — Encounter: Payer: Self-pay | Admitting: Family Medicine

## 2019-05-24 ENCOUNTER — Telehealth: Payer: Self-pay | Admitting: Family Medicine

## 2019-05-24 ENCOUNTER — Other Ambulatory Visit: Payer: Self-pay | Admitting: Family Medicine

## 2019-05-24 MED ORDER — OXYCODONE HCL 15 MG PO TABS: 15.0000 mg | ORAL_TABLET | Freq: Three times a day (TID) | ORAL | 0 refills | Status: DC | PRN

## 2019-05-24 NOTE — Telephone Encounter (Signed)
Requested medication (s) are due for refill today: yes  Requested medication (s) are on the active medication list: yes  Last refill:  05/24/2019  Future visit scheduled: yes  Notes to clinic:  [facility].Paitent states that the [facility] doesn't take her gold card for payment  Requested Prescriptions  Pending Prescriptions Disp Refills   oxyCODONE (ROXICODONE) 15 MG immediate release tablet 75 tablet 0    Sig: Take 1 tablet (15 mg total) by mouth every 8 (eight) hours as needed for pain.     Not Delegated - Analgesics:  Opioid Agonists Failed - 05/24/2019  2:00 PM      Failed - This refill cannot be delegated      Passed - Urine Drug Screen completed in last 360 days.      Passed - Valid encounter within last 6 months    Recent Outpatient Visits          3 months ago High risk medication use   [facility] [place], MD   9 months ago Preventative health care   [place] [facility] [place], MD   1 year ago Chest pressure   [place] [facility] [place], MD   1 year ago High risk medication use   [facility] [place], MD   1 year ago Hyperglycemia   [facility] [place], MD      Future Appointments            In 2 months Mosie Lukes, MD [facility] [place]

## 2019-05-24 NOTE — Telephone Encounter (Signed)
Medication Refill - Medication: oxyCODONE (ROXICODONE) 15 MG immediate release tablet    Has the patient contacted their pharmacy? Yes.   Pt is needing this prescription to be sent back to the new pharmacy because the pharmacy it was sent to does not take her form of payment. Please advise.  (Agent: If no, request that the patient contact the pharmacy for the refill.) (Agent: If yes, when and what did the pharmacy advise?)  Preferred Pharmacy (with phone number or street name):  [id number] [place], [place] - [address] AT [address] [place] [address]  Phone: [phone] Fax: [phone]  Not a 24 hour pharmacy; exact hours not known.     Agent: Please be advised that RX refills may take up to 3 business days. We ask that you follow-up with your pharmacy.

## 2019-05-25 ENCOUNTER — Encounter: Payer: Self-pay | Admitting: Family Medicine

## 2019-05-26 NOTE — Telephone Encounter (Signed)
Patient called in stating she is needing her script that was sent in on 25th to  [id number] [place], [place] [phone] (Phone) [phone] (Fax)  States she [facility] and told them not to fill it. Please advise.

## 2019-05-27 ENCOUNTER — Other Ambulatory Visit: Payer: Self-pay | Admitting: Family Medicine

## 2019-05-27 ENCOUNTER — Telehealth: Payer: Self-pay | Admitting: *Deleted

## 2019-05-27 MED ORDER — OXYCODONE HCL 15 MG PO TABS: 15.0000 mg | ORAL_TABLET | Freq: Three times a day (TID) | ORAL | 0 refills | Status: DC | PRN

## 2019-05-27 NOTE — Telephone Encounter (Signed)
done

## 2019-05-27 NOTE — Telephone Encounter (Signed)
Requesting:oxycodone  Contract:yes UDS:low risk next screen 08/20/19 Last OV:02/08/19 Next OV:08/11/19 Last Refill:05/03/19  #[id number] Database:   We have removed [name] [name] as her pharmacy. Patient did not pick the rx up from 8/25.   Please advise

## 2019-05-27 NOTE — Telephone Encounter (Signed)
Yes she can have the refill please start rx

## 2019-05-27 NOTE — Telephone Encounter (Signed)
I do not know why this keeps going to [facility]. We have taking that pharmacy out several times and the [facility]

## 2019-05-27 NOTE — Telephone Encounter (Signed)
Pt calling back to check status. Please advise   [phone]

## 2019-05-27 NOTE — Telephone Encounter (Signed)
Oxycodone was sent to [facility].  [facility]  [facility] have [facility] from her pharmacy list.  They cannot transfer because its a narcotic.

## 2019-05-27 NOTE — Telephone Encounter (Signed)
Who Is Calling Patient / Member / Family / Caregiver Call Type Triage / Clinical Relationship To Patient Self Return Phone Number [phone] (Primary) Chief Complaint Back Pain - General Reason for Call Symptomatic / Request for Health Information Initial Comment Caller states she needs a medication refill, it was sent to the wrong pharmacy. She is completely out. She was informed by Dr. that it would be sent to the right one, but she has not receive it yet. Medication is opioid for her back pain. Translation No Nurse Assessment Nurse: Linus Mako, RN, Tamera Stands Date/Time Eilene Ghazi Time): 05/26/2019 8:02:54 PM Confirm and document reason for call. If symptomatic, describe symptoms. ---Caller states she needs a medication refill, it was sent to the wrong pharmacy. She is completely out. Medication is Oxycodone 15mg . caller states she is having back pain currently

## 2019-05-30 NOTE — Telephone Encounter (Signed)
Patient did get medication.

## 2019-05-31 NOTE — Telephone Encounter (Signed)
Pharmacy updated.

## 2019-06-01 ENCOUNTER — Other Ambulatory Visit: Payer: Self-pay | Admitting: Family Medicine

## 2019-06-01 NOTE — Telephone Encounter (Signed)
Requested medication (s) are due for refill today: yes  Requested medication (s) are on the active medication list: yes  Last refill:  06/22/2017  Future visit scheduled: yes  Notes to clinic:  Review for refill   Requested Prescriptions  Pending Prescriptions Disp Refills   colchicine 0.6 MG tablet 6 tablet 1    Sig: 2 tabs po once then 1 tab po q 2 hours prn pain til pain gone, max of 6 tabs in 24 hours or intolerable diarrhea     Endocrinology:  Gout Agents Failed - 06/01/2019 11:44 AM      Failed - Uric Acid in normal range and within 360 days    Uric Acid, Serum  Date Value Ref Range Status  08/10/2018 8.2 (H) 2.4 - 7.0 mg/dL Final         Passed - Cr in normal range and within 360 days    Creat  Date Value Ref Range Status  09/05/2016 0.56 0.50 - 1.05 mg/dL Final    Comment:      For patients > or = 57 years of age: The upper reference limit for Creatinine is approximately 13% higher for people identified as African-American.      Creatinine, Ser  Date Value Ref Range Status  02/15/2019 0.54 0.40 - 1.20 mg/dL Final         Passed - Valid encounter within last 12 months    Recent Outpatient Visits          3 months ago High risk medication use   [facility] [place], MD   9 months ago Preventative health care   [place] [facility] [place], MD   1 year ago Chest pressure   [place] [facility] [place], MD   1 year ago High risk medication use   [facility] [place], MD   1 year ago Hyperglycemia   [facility] [place], MD      Future Appointments            In 2 months Mosie Lukes, MD [place] [facility] [place]

## 2019-06-01 NOTE — Telephone Encounter (Signed)
Medication: colchicine 0.6 MG tablet    Patient is requesting a refill of this medication.    [id number] [place], [place] - [address] AT [place] [phone] (Phone) [phone] (Fax)

## 2019-06-03 ENCOUNTER — Other Ambulatory Visit: Payer: Self-pay | Admitting: Family Medicine

## 2019-06-03 ENCOUNTER — Encounter: Payer: Self-pay | Admitting: Family Medicine

## 2019-06-03 MED ORDER — COLCHICINE 0.6 MG PO TABS: ORAL_TABLET | ORAL | 1 refills | Status: DC

## 2019-06-03 NOTE — Telephone Encounter (Signed)
Pt stated the  Pharmacy never received this prescription and can you please resend it.

## 2019-06-03 NOTE — Addendum Note (Signed)
Addended by: Jefferson Fuel on: 06/03/2019 02:31 PM   Modules accepted: Orders

## 2019-06-03 NOTE — Telephone Encounter (Signed)
[facility] Will resend

## 2019-06-07 ENCOUNTER — Encounter: Payer: Self-pay | Admitting: Family Medicine

## 2019-06-10 ENCOUNTER — Encounter: Payer: Self-pay | Admitting: Family Medicine

## 2019-06-10 ENCOUNTER — Other Ambulatory Visit: Payer: Self-pay | Admitting: Family Medicine

## 2019-06-12 ENCOUNTER — Other Ambulatory Visit: Payer: Self-pay | Admitting: Family Medicine

## 2019-06-12 MED ORDER — METHYLPREDNISOLONE 4 MG PO TABS: ORAL_TABLET | ORAL | 0 refills | Status: DC

## 2019-06-13 ENCOUNTER — Telehealth: Payer: Self-pay | Admitting: *Deleted

## 2019-06-13 NOTE — Telephone Encounter (Signed)
Who Is Calling Patient / Member / Family / Caregiver Call Type Triage / Clinical Relationship To Patient Self Return Phone Number [phone] (Primary) Chief Complaint Toe Pain Reason for Call Medication Question / Request Initial Comment Caller states that she has been on some medication for gout and it isn't working. She states that a new medication called Methylprednifolone was prescribed. She needs to know about this medication. She lost the paperwork for this medication. She is having pain in her toes. She states that she is having pain in her left heel as well. Translation No Nurse Assessment Nurse: Alveta Heimlich, RN, Rise Paganini Date/Time (Eastern Time): 06/12/2019 7:29:07 PM Confirm and document reason for call. If symptomatic, describe symptoms. ---Caller states she just picked up a new med for gout today. The doctor faxed this in to pharmacy and she picked it up at 4 pm. She does not have the pamplet that came with it. She does not know what to do. The bottle says take 5 tabs on the first day by mouth, the next day, take 4 tab by mouth, the next day take 3 tabs by mouth, the next day take 2 tabs by mouth , the next day take 1 tab. She is unsure to take them all at once. Normally has it in the right foot in the big toe. This time started in 2nd toe in left foot and in metatarsals. That got better , but now feel on big toe. It has moved into her left heel.   Comments User: Debby Bud, RN Date/Time Eilene Ghazi Time): 06/12/2019 7:48:49 PM pt says she is unable to go to dr in 2 or 3 days due to financial concerns. She says she will not be going, just needs to find out how to take her med. User: Debby Bud, RN Date/Time Eilene Ghazi Time): 06/12/2019 7:50:31 PM The new med is methylprednifolone. User: Debby Bud, RN Date/Time Eilene Ghazi Time): 06/12/2019 8:13:28 PM Called pt and told her Dr. says to take them all together. 5 pills all together the first day, 4 pills all together the 2nd  day, 3 pills all together the 3rd day, 2 pills together the 4th day and 1 pill the 5 th day. Since she has already taken 2 pills today, go ahead and take the other 3 now. She may take with breakfast or lunch if insomnia. Paging DoctorName Phone DateTime Result/Outcome Message Type Notes Martinique, Betty - MD [id number] 06/12/2019 8:05:08 PM Paged On Call Back to Call Center Doctor Paged [place], Betty - MD 06/12/2019 8:11:25 PM Spoke with On Call - General Message Result Report given to Dr. She said take all pills together. 5 the first day all together, 4 the 2nd day, all together, 3 the 3rd day all together , 2 the 4th day all together and 1 the 5 th day. Since pt has already taken 2 of the 5 tabs for the first day, go ahead and take the other 3 now to make total of 5. She may want to take them at breakfast or lunch if cause insomnina

## 2019-06-17 ENCOUNTER — Other Ambulatory Visit: Payer: Self-pay | Admitting: Family Medicine

## 2019-06-17 ENCOUNTER — Telehealth: Payer: Self-pay | Admitting: Family Medicine

## 2019-06-17 MED ORDER — OXYCODONE HCL 15 MG PO TABS: 15.0000 mg | ORAL_TABLET | Freq: Three times a day (TID) | ORAL | 0 refills | Status: DC | PRN

## 2019-06-17 NOTE — Telephone Encounter (Signed)
I sent in rf

## 2019-06-17 NOTE — Telephone Encounter (Signed)
Pt is calling to let md know she is going better with gout. Pt needs refill on oxycodone 15 mg. [facility] in [place]

## 2019-06-17 NOTE — Telephone Encounter (Signed)
Patient is calling to follow up on status of her medication. Patient was made aware up to 3 business days for refills. Thanks!

## 2019-06-17 NOTE — Telephone Encounter (Signed)
Please advise 

## 2019-06-18 ENCOUNTER — Other Ambulatory Visit: Payer: Self-pay | Admitting: Family Medicine

## 2019-06-18 ENCOUNTER — Encounter: Payer: Self-pay | Admitting: Family Medicine

## 2019-06-20 MED ORDER — OXYCODONE HCL 15 MG PO TABS: 15.0000 mg | ORAL_TABLET | Freq: Three times a day (TID) | ORAL | 0 refills | Status: DC | PRN

## 2019-07-07 ENCOUNTER — Other Ambulatory Visit: Payer: Self-pay | Admitting: Family Medicine

## 2019-07-07 ENCOUNTER — Encounter: Payer: Self-pay | Admitting: Family Medicine

## 2019-07-08 ENCOUNTER — Encounter: Payer: Self-pay | Admitting: Family Medicine

## 2019-07-08 NOTE — Telephone Encounter (Signed)
Do we need to change to prescription directions and quantity of the colchicine that was sent in yesterday. There was only 6 tabs that went in  Please advise

## 2019-07-10 ENCOUNTER — Other Ambulatory Visit: Payer: Self-pay | Admitting: Family Medicine

## 2019-07-10 MED ORDER — COLCHICINE 0.6 MG PO TABS: ORAL_TABLET | ORAL | 1 refills | Status: DC

## 2019-07-15 ENCOUNTER — Other Ambulatory Visit: Payer: Self-pay | Admitting: Family Medicine

## 2019-07-15 MED ORDER — FENTANYL 50 MCG/HR TD PT72: 1.0000 | MEDICATED_PATCH | TRANSDERMAL | 0 refills | Status: DC

## 2019-07-15 MED ORDER — OXYCODONE HCL 15 MG PO TABS: 15.0000 mg | ORAL_TABLET | Freq: Three times a day (TID) | ORAL | 0 refills | Status: DC | PRN

## 2019-07-30 ENCOUNTER — Other Ambulatory Visit: Payer: Self-pay | Admitting: Family Medicine

## 2019-08-01 MED ORDER — VITAMIN D (ERGOCALCIFEROL) 1.25 MG (50000 UNIT) PO CAPS: 50000.0000 [IU] | ORAL_CAPSULE | ORAL | 3 refills | Status: DC

## 2019-08-10 ENCOUNTER — Other Ambulatory Visit: Payer: Self-pay

## 2019-08-11 ENCOUNTER — Encounter: Payer: Self-pay | Admitting: *Deleted

## 2019-08-11 ENCOUNTER — Ambulatory Visit (INDEPENDENT_AMBULATORY_CARE_PROVIDER_SITE_OTHER): Payer: BC Managed Care – PPO | Admitting: Family Medicine

## 2019-08-11 ENCOUNTER — Encounter: Payer: Self-pay | Admitting: Family Medicine

## 2019-08-11 VITALS — BP 128/65 | HR 82 | Temp 97.3°F | Resp 12 | Ht 65.0 in | Wt 236.4 lb

## 2019-08-11 DIAGNOSIS — M1A9XX0 Chronic gout, unspecified, without tophus (tophi): Secondary | ICD-10-CM | POA: Diagnosis not present

## 2019-08-11 DIAGNOSIS — R739 Hyperglycemia, unspecified: Secondary | ICD-10-CM

## 2019-08-11 DIAGNOSIS — M545 Low back pain, unspecified: Secondary | ICD-10-CM

## 2019-08-11 DIAGNOSIS — E039 Hypothyroidism, unspecified: Secondary | ICD-10-CM

## 2019-08-11 DIAGNOSIS — Z0000 Encounter for general adult medical examination without abnormal findings: Secondary | ICD-10-CM

## 2019-08-11 DIAGNOSIS — Z79899 Other long term (current) drug therapy: Secondary | ICD-10-CM

## 2019-08-11 DIAGNOSIS — I10 Essential (primary) hypertension: Secondary | ICD-10-CM | POA: Diagnosis not present

## 2019-08-11 DIAGNOSIS — E785 Hyperlipidemia, unspecified: Secondary | ICD-10-CM

## 2019-08-11 DIAGNOSIS — K635 Polyp of colon: Secondary | ICD-10-CM

## 2019-08-11 DIAGNOSIS — E559 Vitamin D deficiency, unspecified: Secondary | ICD-10-CM

## 2019-08-11 DIAGNOSIS — R002 Palpitations: Secondary | ICD-10-CM

## 2019-08-11 MED ORDER — FENTANYL 25 MCG/HR TD PT72: 1.0000 | MEDICATED_PATCH | TRANSDERMAL | 0 refills | Status: DC

## 2019-08-11 MED ORDER — FLUOXETINE HCL (PMDD) 10 MG PO TABS: ORAL_TABLET | ORAL | 1 refills | Status: DC

## 2019-08-11 NOTE — Assessment & Plan Note (Signed)
Supplement and monitor 

## 2019-08-11 NOTE — Assessment & Plan Note (Signed)
Encouraged heart healthy diet, increase exercise, avoid trans fats, consider a krill oil cap daily 

## 2019-08-11 NOTE — Patient Instructions (Signed)
Omron blood pressure cuff, upper arm   Pulse oximeter, want oxygen in 90s   Preventive Care 48-57 Years Old, Female Preventive care refers to visits with your health care provider and lifestyle choices that can promote health and wellness. This includes:  A yearly physical exam. This may also be called an annual well check.  Regular dental visits and eye exams.  Immunizations.  Screening for certain conditions.  Healthy lifestyle choices, such as eating a healthy diet, getting regular exercise, not using drugs or products that contain nicotine and tobacco, and limiting alcohol use. What can I expect for my preventive care visit? Physical exam Your health care provider will check your:  Height and weight. This may be used to calculate body mass index (BMI), which tells if you are at a healthy weight.  Heart rate and blood pressure.  Skin for abnormal spots. Counseling Your health care provider may ask you questions about your:  Alcohol, tobacco, and drug use.  Emotional well-being.  Home and relationship well-being.  Sexual activity.  Eating habits.  Work and work Statistician.  Method of birth control.  Menstrual cycle.  Pregnancy history. What immunizations do I need?  Influenza (flu) vaccine  This is recommended every year. Tetanus, diphtheria, and pertussis (Tdap) vaccine  You may need a Td booster every 10 years. Varicella (chickenpox) vaccine  You may need this if you have not been vaccinated. Zoster (shingles) vaccine  You may need this after age 57. Measles, mumps, and rubella (MMR) vaccine  You may need at least one dose of MMR if you were born in 1957 or later. You may also need a second dose. Pneumococcal conjugate (PCV13) vaccine  You may need this if you have certain conditions and were not previously vaccinated. Pneumococcal polysaccharide (PPSV23) vaccine  You may need one or two doses if you smoke cigarettes or if you have certain  conditions. Meningococcal conjugate (MenACWY) vaccine  You may need this if you have certain conditions. Hepatitis A vaccine  You may need this if you have certain conditions or if you travel or work in places where you may be exposed to hepatitis A. Hepatitis B vaccine  You may need this if you have certain conditions or if you travel or work in places where you may be exposed to hepatitis B. Haemophilus influenzae type b (Hib) vaccine  You may need this if you have certain conditions. Human papillomavirus (HPV) vaccine  If recommended by your health care provider, you may need three doses over 6 months. You may receive vaccines as individual doses or as more than one vaccine together in one shot (combination vaccines). Talk with your health care provider about the risks and benefits of combination vaccines. What tests do I need? Blood tests  Lipid and cholesterol levels. These may be checked every 5 years, or more frequently if you are over 57 years old.  Hepatitis C test.  Hepatitis B test. Screening  Lung cancer screening. You may have this screening every year starting at age 57 if you have a 30-pack-year history of smoking and currently smoke or have quit within the past 15 years.  Colorectal cancer screening. All adults should have this screening starting at age 57 and continuing until age 18. Your health care provider may recommend screening at age 30 if you are at increased risk. You will have tests every 1-10 years, depending on your results and the type of screening test.  Diabetes screening. This is done by checking your  blood sugar (glucose) after you have not eaten for a while (fasting). You may have this done every 1-3 years.  Mammogram. This may be done every 1-2 years. Talk with your health care provider about when you should start having regular mammograms. This may depend on whether you have a family history of breast cancer.  BRCA-related cancer screening. This  may be done if you have a family history of breast, ovarian, tubal, or peritoneal cancers.  Pelvic exam and Pap test. This may be done every 3 years starting at age 2. Starting at age 57, this may be done every 5 years if you have a Pap test in combination with an HPV test. Other tests  Sexually transmitted disease (STD) testing.  Bone density scan. This is done to screen for osteoporosis. You may have this scan if you are at high risk for osteoporosis. Follow these instructions at home: Eating and drinking  Eat a diet that includes fresh fruits and vegetables, whole grains, lean protein, and low-fat dairy.  Take vitamin and mineral supplements as recommended by your health care provider.  Do not drink alcohol if: ? Your health care provider tells you not to drink. ? You are pregnant, may be pregnant, or are planning to become pregnant.  If you drink alcohol: ? Limit how much you have to 0-1 drink a day. ? Be aware of how much alcohol is in your drink. In the [place] one drink equals one 12 oz bottle of beer (355 mL), one 5 oz glass of wine (148 mL), or one 1 oz glass of hard liquor (44 mL). Lifestyle  Take daily care of your teeth and gums.  Stay active. Exercise for at least 30 minutes on 5 or more days each week.  Do not use any products that contain nicotine or tobacco, such as cigarettes, e-cigarettes, and chewing tobacco. If you need help quitting, ask your health care provider.  If you are sexually active, practice safe sex. Use a condom or other form of birth control (contraception) in order to prevent pregnancy and STIs (sexually transmitted infections).  If told by your health care provider, take low-dose aspirin daily starting at age 57. What's next?  Visit your health care provider once a year for a well check visit.  Ask your health care provider how often you should have your eyes and teeth checked.  Stay up to date on all vaccines. This information is not  intended to replace advice given to you by your health care provider. Make sure you discuss any questions you have with your health care provider. Document Released: 10/12/2015 Document Revised: 05/27/2018 Document Reviewed: 05/27/2018 Elsevier Patient Education  2020 [employer].

## 2019-08-11 NOTE — Assessment & Plan Note (Signed)
hgba1c acceptable, minimize simple carbs. Increase exercise as tolerated.  

## 2019-08-11 NOTE — Assessment & Plan Note (Signed)
Patient encouraged to maintain heart healthy diet, regular exercise, adequate sleep. Consider daily probiotics. Take medications as prescribed. Labs ordered and reviewed 

## 2019-08-11 NOTE — Assessment & Plan Note (Addendum)
Monitor uric acid and hydrate, started on Allopurinol

## 2019-08-12 ENCOUNTER — Encounter: Payer: Self-pay | Admitting: Gastroenterology

## 2019-08-12 ENCOUNTER — Other Ambulatory Visit (INDEPENDENT_AMBULATORY_CARE_PROVIDER_SITE_OTHER): Payer: BC Managed Care – PPO

## 2019-08-12 LAB — CBC
HCT: 40.7 % (ref 36.0–46.0)
Hemoglobin: 13.7 g/dL (ref 12.0–15.0)
MCHC: 33.7 g/dL (ref 30.0–36.0)
MCV: 100.8 fl — ABNORMAL HIGH (ref 78.0–100.0)
Platelets: 222 10*3/uL (ref 150.0–400.0)
RBC: 4.04 Mil/uL (ref 3.87–5.11)
RDW: 15 % (ref 11.5–15.5)
WBC: 6.8 10*3/uL (ref 4.0–10.5)

## 2019-08-12 LAB — TSH: TSH: 1.82 u[IU]/mL (ref 0.35–4.50)

## 2019-08-12 LAB — COMPREHENSIVE METABOLIC PANEL
ALT: 20 U/L (ref 0–35)
AST: 23 U/L (ref 0–37)
Albumin: 4.5 g/dL (ref 3.5–5.2)
Alkaline Phosphatase: 73 U/L (ref 39–117)
BUN: 21 mg/dL (ref 6–23)
CO2: 28 mEq/L (ref 19–32)
Calcium: 11 mg/dL — ABNORMAL HIGH (ref 8.4–10.5)
Chloride: 102 mEq/L (ref 96–112)
Creatinine, Ser: 0.73 mg/dL (ref 0.40–1.20)
GFR: 81.94 mL/min (ref >60.00)
Glucose, Bld: 103 mg/dL — ABNORMAL HIGH (ref 70–99)
Potassium: 4.2 mEq/L (ref 3.5–5.1)
Sodium: 141 mEq/L (ref 135–145)
Total Bilirubin: 0.4 mg/dL (ref 0.2–1.2)
Total Protein: 7.3 g/dL (ref 6.0–8.3)

## 2019-08-12 LAB — URIC ACID: Uric Acid, Serum: 11 mg/dL — ABNORMAL HIGH (ref 2.4–7.0)

## 2019-08-12 LAB — LIPID PANEL
Cholesterol: 232 mg/dL — ABNORMAL HIGH (ref 0–200)
HDL: 63.2 mg/dL (ref >39.00)
NonHDL: 168.69
Total CHOL/HDL Ratio: 4
Triglycerides: 400 mg/dL — ABNORMAL HIGH (ref 0.0–149.0)
VLDL: 80 mg/dL — ABNORMAL HIGH (ref 0.0–40.0)

## 2019-08-12 LAB — LDL CHOLESTEROL, DIRECT: Direct LDL: 108 mg/dL

## 2019-08-12 LAB — HEMOGLOBIN A1C: Hgb A1c MFr Bld: 5.3 % (ref 4.6–6.5)

## 2019-08-12 LAB — VITAMIN D 25 HYDROXY (VIT D DEFICIENCY, FRACTURES): VITD: 35.76 ng/mL (ref 30.00–100.00)

## 2019-08-14 ENCOUNTER — Other Ambulatory Visit: Payer: Self-pay | Admitting: Family Medicine

## 2019-08-14 NOTE — Progress Notes (Signed)
Subjective:    Patient ID: [name] [name], female    DOB: 1961-02-01, 58 y.o.   MRN: [id number]  Chief Complaint  Patient presents with  . Annual Exam    HPI Patient is in today for annual preventative exam and follow up on chronic medical concerns including chronic back pain, hypertension, obesity and more. She is interested in trying to decrease the strength of her Fentanyl dose for reasons of cost. No recent injury or fall. Is trying to maintain a heart healthy diet and stay as active as tolerated. She is trying to maintain quarantine. Denies CP/palp/SOB/HA/congestion/fevers/GI or GU c/o. Taking meds as prescribed  Past Medical History:  Diagnosis Date  . Acute foot pain, right 01/13/2017  . Acute upper respiratory infections of unspecified site 06/12/2014  . Anxiety and depression    chronic  . Breast cancer screening 09/05/2016  . Cervical cancer screening 09/05/2016  . Chronic back pain   . Depression with anxiety 08/23/2008   Qualifier: Diagnosis of  By: Niel Hummer MD, Lorinda Creed   . Gout 05/05/2017  . History of fibromyalgia    suspected   . Hypertension   . Insomnia   . Knee pain, right 06/24/2015  . Morbid obesity ([place])   . Ovarian cyst, bilateral   . Preventative health care 06/24/2015  . PTSD (post-traumatic stress disorder)   . Vitamin D deficiency 06/24/2015    Past Surgical History:  Procedure Laterality Date  . OVARIAN CYST REMOVAL      Family History  Problem Relation Age of Onset  . Rheum arthritis Father   . Diabetes Father   . Congestive Heart Failure Father   . Hypertension Father   . Stroke Father   . Cancer Maternal Uncle 25       colon  . Hip fracture Maternal Grandmother   . COPD Maternal Grandfather 87       colon  . Lymphoma Paternal Grandmother   . ADD / ADHD Neg Hx   . Depression Neg Hx   . Alcohol abuse Neg Hx   . Drug abuse Neg Hx     Social History   Socioeconomic History  . Marital status: Divorced    Spouse name: Not on file  .  Number of children: Not on file  . Years of education: Not on file  . Highest education level: Not on file  Occupational History  . Not on file  Social Needs  . Financial resource strain: Not on file  . Food insecurity    Worry: Not on file    Inability: Not on file  . Transportation needs    Medical: Not on file    Non-medical: Not on file  Tobacco Use  . Smoking status: Former Smoker    Quit date: 04/04/1989    Years since quitting: 30.3  . Smokeless tobacco: Never Used  Substance and Sexual Activity  . Alcohol use: Yes    Comment: occassionally- 2 glassses of wine 2x/month  . Drug use: No    Comment: take meds as prescribed  . Sexual activity: Not on file  Lifestyle  . Physical activity    Days per week: Not on file    Minutes per session: Not on file  . Stress: Not on file  Relationships  . Social Herbalist on phone: Not on file    Gets together: Not on file    Attends religious service: Not on file    Active member  of club or organization: Not on file    Attends meetings of clubs or organizations: Not on file    Relationship status: Not on file  . Intimate partner violence    Fear of current or ex partner: Not on file    Emotionally abused: Not on file    Physically abused: Not on file    Forced sexual activity: Not on file  Other Topics Concern  . Not on file  Social History Narrative  . Not on file    Outpatient Medications Prior to Visit  Medication Sig Dispense Refill  . atorvastatin (LIPITOR) 20 MG tablet Take 1 tablet (20 mg total) by mouth daily. 90 tablet 3  . beclomethasone (BECONASE-AQ) 42 MCG/SPRAY nasal spray Place 1 spray into both nostrils 2 (two) times daily. Dose is for each nostril. 25 g 1  . colchicine 0.6 MG tablet TAKE 2 TABS BY MOUTH ONCE THEN 1 TAB EVERY 2 HOURS AS NEEDED PAIN TIL PAIN GONE, MAX OF 6 TABS IN 24 HOURS OR INTOLERABLE DIARRHEA 6 tablet 1  . fentaNYL (DURAGESIC) 50 MCG/HR Place 1 patch onto the skin every 3 (three)  days. 10 patch 0  . ketorolac (TORADOL) 10 MG tablet Take 1 tablet (10 mg total) by mouth 2 (two) times daily as needed. 10 tablet 0  . lisinopril (PRINIVIL,ZESTRIL) 10 MG tablet TAKE ONE TABLET BY MOUTH ONE TIME DAILY 90 tablet 1  . metoprolol succinate (TOPROL-XL) 25 MG 24 hr tablet Take 1 tablet (25 mg total) by mouth daily. 30 tablet 5  . ondansetron (ZOFRAN) 4 MG tablet Take 1 tablet (4 mg total) by mouth every 8 (eight) hours as needed for nausea or vomiting. 60 tablet 2  . oxyCODONE (ROXICODONE) 15 MG immediate release tablet Take 1 tablet (15 mg total) by mouth every 8 (eight) hours as needed for pain. 90 tablet 0  . Vitamin D, Ergocalciferol, (DRISDOL) 1.25 MG (50000 UT) CAPS capsule Take 1 capsule (50,000 Units total) by mouth once a week. 12 capsule 3  . Fluoxetine HCl, PMDD, 10 MG TABS TAKE 1 TO 2 TABLETS EVERY DAY 180 tablet 1  . methylPREDNISolone (MEDROL) 4 MG tablet 5 tab po qd X 1d then 4 tab po qd X 1d then 3 tab po qd X 1d then 2 tab po qd then 1 tab po qd 15 tablet 0   No facility-administered medications prior to visit.     Allergies  Allergen Reactions  . Cymbalta [Duloxetine Hcl] Other (See Comments)    Headaches  . Fiorinal [Butalbital-Aspirin-Caffeine] Other (See Comments)    Mental Clarity.    Review of Systems  Constitutional: Negative for chills, fever and malaise/fatigue.  HENT: Negative for congestion and hearing loss.   Eyes: Negative for discharge.  Respiratory: Negative for cough, sputum production and shortness of breath.   Cardiovascular: Negative for chest pain, palpitations and leg swelling.  Gastrointestinal: Negative for abdominal pain, blood in stool, constipation, diarrhea, heartburn, nausea and vomiting.  Genitourinary: Negative for dysuria, frequency, hematuria and urgency.  Musculoskeletal: Positive for back pain and myalgias. Negative for falls.  Skin: Negative for rash.  Neurological: Negative for dizziness, sensory change, loss of  consciousness, weakness and headaches.  Endo/Heme/Allergies: Negative for environmental allergies. Does not bruise/bleed easily.  Psychiatric/Behavioral: Negative for depression and suicidal ideas. The patient is not nervous/anxious and does not have insomnia.        Objective:    Physical Exam Constitutional:      General: She is  not in acute distress.    Appearance: She is not diaphoretic.  HENT:     Head: Normocephalic and atraumatic.     Right Ear: External ear normal.     Left Ear: External ear normal.     Nose: Nose normal.     Mouth/Throat:     Pharynx: No oropharyngeal exudate.  Eyes:     General: No scleral icterus.       Right eye: No discharge.        Left eye: No discharge.     Conjunctiva/sclera: Conjunctivae normal.     Pupils: Pupils are equal, round, and reactive to light.  Neck:     Musculoskeletal: Normal range of motion and neck supple.     Thyroid: No thyromegaly.  Cardiovascular:     Rate and Rhythm: Normal rate and regular rhythm.     Heart sounds: Normal heart sounds. No murmur.  Pulmonary:     Effort: Pulmonary effort is normal. No respiratory distress.     Breath sounds: Normal breath sounds. No wheezing or rales.  Abdominal:     General: Bowel sounds are normal. There is no distension.     Palpations: Abdomen is soft. There is no mass.     Tenderness: There is no abdominal tenderness.  Musculoskeletal: Normal range of motion.        General: No tenderness.  Lymphadenopathy:     Cervical: No cervical adenopathy.  Skin:    General: Skin is warm and dry.     Findings: No rash.  Neurological:     Mental Status: She is alert and oriented to person, place, and time.     Cranial Nerves: No cranial nerve deficit.     Coordination: Coordination normal.     Deep Tendon Reflexes: Reflexes are normal and symmetric. Reflexes normal.     BP 128/65 (BP Location: Left Arm, Cuff Size: Large)   Pulse 82   Temp (!) 97.3 F (36.3 C) (Temporal)   Resp 12    Ht 5\' 5"  (1.651 m)   Wt 236 lb 6.4 oz (107.2 kg)   SpO2 97%   BMI 39.34 kg/m  Wt Readings from Last 3 Encounters:  08/11/19 236 lb 6.4 oz (107.2 kg)  08/10/18 238 lb (108 kg)  04/06/18 233 lb (105.7 kg)    Diabetic Foot Exam - Simple   No data filed     Lab Results  Component Value Date   WBC 6.8 08/11/2019   HGB 13.7 08/11/2019   HCT 40.7 08/11/2019   PLT 222.0 08/11/2019   GLUCOSE 103 (H) 08/11/2019   CHOL 232 (H) 08/11/2019   TRIG (H) 08/11/2019    400.0 Triglyceride is over 400; calculations on Lipids are invalid.   HDL 63.20 08/11/2019   LDLDIRECT 108.0 08/11/2019   LDLCALC 105 (H) 07/06/2017   ALT 20 08/11/2019   AST 23 08/11/2019   NA 141 08/11/2019   K 4.2 08/11/2019   CL 102 08/11/2019   CREATININE 0.73 08/11/2019   BUN 21 08/11/2019   CO2 28 08/11/2019   TSH 1.82 08/11/2019   HGBA1C 5.3 08/11/2019    Lab Results  Component Value Date   TSH 1.82 08/11/2019   Lab Results  Component Value Date   WBC 6.8 08/11/2019   HGB 13.7 08/11/2019   HCT 40.7 08/11/2019   MCV 100.8 (H) 08/11/2019   PLT 222.0 08/11/2019   Lab Results  Component Value Date   NA 141 08/11/2019   K  4.2 08/11/2019   CO2 28 08/11/2019   GLUCOSE 103 (H) 08/11/2019   BUN 21 08/11/2019   CREATININE 0.73 08/11/2019   BILITOT 0.4 08/11/2019   ALKPHOS 73 08/11/2019   AST 23 08/11/2019   ALT 20 08/11/2019   PROT 7.3 08/11/2019   ALBUMIN 4.5 08/11/2019   CALCIUM 11.0 (H) 08/11/2019   GFR 81.94 08/11/2019   Lab Results  Component Value Date   CHOL 232 (H) 08/11/2019   Lab Results  Component Value Date   HDL 63.20 08/11/2019   Lab Results  Component Value Date   LDLCALC 105 (H) 07/06/2017   Lab Results  Component Value Date   TRIG (H) 08/11/2019    400.0 Triglyceride is over 400; calculations on Lipids are invalid.   Lab Results  Component Value Date   CHOLHDL 4 08/11/2019   Lab Results  Component Value Date   HGBA1C 5.3 08/11/2019       Assessment & Plan:    Problem List Items Addressed This Visit    Hypothyroidism   Relevant Orders   TSH (Completed)   Hyperlipidemia    Encouraged heart healthy diet, increase exercise, avoid trans fats, consider a krill oil cap daily      Relevant Orders   Lipid panel (Completed)   MORBID OBESITY    Encouraged DASH diet, decrease po intake and increase exercise as tolerated. Needs 7-8 hours of sleep nightly. Avoid trans fats, eat small, frequent meals every 4-5 hours with lean proteins, complex carbs and healthy fats. Minimize simple carbs, consider [employer] APP      Essential hypertension - Primary   Relevant Orders   CBC (Completed)   CMP (Completed)   TSH (Completed)   Low back pain    She is trying to cut down on her medication doses. We will try decreasing her Fentanyl to the 25 mcg patches every other day.       Relevant Medications   fentaNYL (DURAGESIC) 25 MCG/HR   Other Relevant Orders   Pain Mgmt, Profile 8 w/Conf, U   PALPITATIONS, OCCASIONAL   Vitamin D deficiency    Supplement and monitor       Preventative health care    Patient encouraged to maintain heart healthy diet, regular exercise, adequate sleep. Consider daily probiotics. Take medications as prescribed. Labs ordered and reviewed      Gout    Monitor uric acid and hydrate      Relevant Orders   Uric acid (Completed)   Hyperglycemia    hgba1c acceptable, minimize simple carbs. Increase exercise as tolerated.       Relevant Orders   A1C (Completed)   Colon polyp    Is due for f/u colonoscopy and she would like to move her care to [facility], referral placed      Relevant Orders   Ambulatory referral to Gastroenterology      I have discontinued [facility] C. Deguzman "Candace"'s methylPREDNISolone. I am also having her start on fentaNYL. Additionally, I am having her maintain her beclomethasone, ketorolac, ondansetron, metoprolol succinate, lisinopril, atorvastatin, colchicine, fentaNYL, oxyCODONE, Vitamin D (Ergocalciferol), and  Fluoxetine HCl (PMDD).  Meds ordered this encounter  Medications  . fentaNYL (DURAGESIC) 25 MCG/HR    Sig: Place 1 patch onto the skin every other day.    Dispense:  15 patch    Refill:  0  . Fluoxetine HCl, PMDD, 10 MG TABS    Sig: TAKE 1 TO 2 TABLETS EVERY DAY    Dispense:  180 tablet    Refill:  1     Penni Homans, MD

## 2019-08-14 NOTE — Assessment & Plan Note (Signed)
She is trying to cut down on her medication doses. We will try decreasing her Fentanyl to the 25 mcg patches every other day.

## 2019-08-14 NOTE — Assessment & Plan Note (Signed)
Is due for f/u colonoscopy and she would like to move her care to [facility], referral placed

## 2019-08-14 NOTE — Assessment & Plan Note (Deleted)
On Levothyroxine, continue to monitor 

## 2019-08-14 NOTE — Assessment & Plan Note (Signed)
Encouraged DASH diet, decrease po intake and increase exercise as tolerated. Needs 7-8 hours of sleep nightly. Avoid trans fats, eat small, frequent meals every 4-5 hours with lean proteins, complex carbs and healthy fats. Minimize simple carbs, consider [employer] APP 

## 2019-08-15 MED ORDER — FLUOXETINE HCL 10 MG PO TABS: 10.0000 mg | ORAL_TABLET | Freq: Every day | ORAL | 3 refills | Status: DC

## 2019-08-15 MED ORDER — OXYCODONE HCL 15 MG PO TABS: 15.0000 mg | ORAL_TABLET | Freq: Three times a day (TID) | ORAL | 0 refills | Status: DC | PRN

## 2019-08-15 MED ORDER — ALLOPURINOL 100 MG PO TABS: 100.0000 mg | ORAL_TABLET | Freq: Every day | ORAL | 6 refills | Status: DC

## 2019-08-15 NOTE — Telephone Encounter (Signed)
Requesting:oxycodone  Contract: yes [id number] one  Last OV:08/11/19  Next OV:02/09/20 Last Refill: 07/15/19  #[id number] Database:   Please advise

## 2019-08-15 NOTE — Addendum Note (Signed)
Addended by: Magdalene Molly A on: 08/15/2019 04:13 PM   Modules accepted: Orders

## 2019-08-15 NOTE — Addendum Note (Signed)
Addended by: Magdalene Molly A on: 08/15/2019 04:27 PM   Modules accepted: Orders

## 2019-08-17 LAB — PAIN MGMT, PROFILE 8 W/CONF, U
6 Acetylmorphine: NEGATIVE ng/mL
Alcohol Metabolites: NEGATIVE ng/mL (ref <500)
Amphetamines: NEGATIVE ng/mL
Benzodiazepines: NEGATIVE ng/mL
Buprenorphine, Urine: NEGATIVE ng/mL
Cocaine Metabolite: NEGATIVE ng/mL
Codeine: NEGATIVE ng/mL
Creatinine: 13.9 mg/dL
Hydrocodone: NEGATIVE ng/mL
Hydromorphone: NEGATIVE ng/mL
MDMA: NEGATIVE ng/mL
Marijuana Metabolite: NEGATIVE ng/mL
Morphine: NEGATIVE ng/mL
Norhydrocodone: NEGATIVE ng/mL
Noroxycodone: 1776 ng/mL
Opiates: NEGATIVE ng/mL
Oxidant: NEGATIVE ug/mL
Oxycodone: 409 ng/mL
Oxycodone: POSITIVE ng/mL
Oxymorphone: 129 ng/mL
Specific Gravity: 1.003 (ref >=1.0)
pH: 5.9 (ref 4.5–9.0)

## 2019-08-30 ENCOUNTER — Other Ambulatory Visit: Payer: Self-pay

## 2019-08-30 ENCOUNTER — Telehealth: Payer: Self-pay | Admitting: Family Medicine

## 2019-08-30 ENCOUNTER — Encounter: Payer: Self-pay | Admitting: Gastroenterology

## 2019-08-30 ENCOUNTER — Ambulatory Visit (INDEPENDENT_AMBULATORY_CARE_PROVIDER_SITE_OTHER): Payer: BC Managed Care – PPO | Admitting: Gastroenterology

## 2019-08-30 ENCOUNTER — Other Ambulatory Visit (INDEPENDENT_AMBULATORY_CARE_PROVIDER_SITE_OTHER): Payer: BC Managed Care – PPO

## 2019-08-30 ENCOUNTER — Other Ambulatory Visit: Payer: Self-pay | Admitting: Family Medicine

## 2019-08-30 VITALS — BP 136/82 | HR 73 | Temp 98.5°F | Ht 65.0 in | Wt 247.4 lb

## 2019-08-30 DIAGNOSIS — I10 Essential (primary) hypertension: Secondary | ICD-10-CM | POA: Diagnosis not present

## 2019-08-30 DIAGNOSIS — Z800 Family history of malignant neoplasm of digestive organs: Secondary | ICD-10-CM

## 2019-08-30 DIAGNOSIS — K635 Polyp of colon: Secondary | ICD-10-CM | POA: Diagnosis not present

## 2019-08-30 DIAGNOSIS — Z1159 Encounter for screening for other viral diseases: Secondary | ICD-10-CM | POA: Diagnosis not present

## 2019-08-30 DIAGNOSIS — Z86010 Personal history of colonic polyps: Secondary | ICD-10-CM | POA: Diagnosis not present

## 2019-08-30 DIAGNOSIS — R195 Other fecal abnormalities: Secondary | ICD-10-CM

## 2019-08-30 MED ORDER — FLUOXETINE HCL 10 MG PO TABS: 10.0000 mg | ORAL_TABLET | Freq: Every day | ORAL | 3 refills | Status: DC

## 2019-08-30 MED ORDER — FENTANYL 25 MCG/HR TD PT72: 1.0000 | MEDICATED_PATCH | TRANSDERMAL | 0 refills | Status: DC

## 2019-08-30 MED ORDER — OXYCODONE HCL 15 MG PO TABS: 15.0000 mg | ORAL_TABLET | Freq: Three times a day (TID) | ORAL | 0 refills | Status: DC | PRN

## 2019-08-30 NOTE — Telephone Encounter (Signed)
Pt came in office wanting a new contract to be printed out since pt has changed her pharmacy to [facility] in [facility] and pt stated that her last  rx fentaNYL (DURAGESIC) 25 MCG/HR and allopurinol (ZYLOPRIM) 100 MG tablet and FLUoxetine (PROZAC) 10 MG tablet was sent to her old pharmacy which she did not pick up since pharmacy was charging her too much, pt would like rx to be sent to pharmacy [facility] if possible. Please advise.

## 2019-08-30 NOTE — Progress Notes (Signed)
Chief Complaint: Colon cancer screening, polyp surveillance, family history of colon cancer, change in bowel habits   Referring Provider:     Mosie Lukes, MD   HPI:     [name] [name] is a 58 y.o. female with a history of chronic back pain (fentanyl, Toradol, oxycodone), anxiety/depresion, hypertension, obesity (BMI 39), gout, referred to the [facility] for evaluation of colon cancer screening/polyp surveillance.  Reports a family history of colon polyps and colon cancer.  Maternal grandfather, maternal aunt, and mother (all diagnosed age >76) with colon cancer.  Several family members with breast cancer as well, to include mother, maternal great-grandmother, cousin.  Separately, she has a history of alternating bowel habits, described as intermittent diarrhea, then reverts to normal stools. No hematochezia. Worse with salad. Worse with anxiety. Had recently run out of Prozac- has been off for a couple weeks. Unsure if symptoms any better/worse on/off Prozac. Previously did well with prn Xanax, but stopped due to need for chronic pain meds.   Recent normal CMP (calcium 11.0), CBC (MCV 100.8).  MCV ULN (~99) since 2018.  Endoscopic history: -Colonoscopy (04/2012, Dr. Lewanda Rife, [facility]): Diminutive left colon polyp, sigmoid diverticulosis, internal hemorrhoids.  No path for review   Past Medical History:  Diagnosis Date  . Acute foot pain, right 01/13/2017  . Acute upper respiratory infections of unspecified site 06/12/2014  . Anxiety and depression    chronic  . Breast cancer screening 09/05/2016  . Cervical cancer screening 09/05/2016  . Chronic back pain   . Depression with anxiety 08/23/2008   Qualifier: Diagnosis of  By: Niel Hummer MD, Lorinda Creed   . Gout 05/05/2017  . History of colon polyps   . History of fibromyalgia    suspected   . Hypertension   . Insomnia   . Knee pain, right 06/24/2015  . Meningitis spinal   . Morbid obesity ([place])   .  Ovarian cyst, bilateral   . Preventative health care 06/24/2015  . PTSD (post-traumatic stress disorder)   . Vitamin D deficiency 06/24/2015     Past Surgical History:  Procedure Laterality Date  . COLONOSCOPY    . OVARIAN CYST REMOVAL     Family History  Problem Relation Age of Onset  . Rheum arthritis Father   . Diabetes Father   . Congestive Heart Failure Father   . Hypertension Father   . Stroke Father   . Cancer Maternal Uncle 24       colon  . Hip fracture Maternal Grandmother   . COPD Maternal Grandfather 87       colon  . Colon cancer Maternal Grandfather   . Lymphoma Paternal Grandmother   . Colon cancer Mother   . Breast cancer Mother        benign  . Colon cancer Maternal Aunt   . Breast cancer Maternal Great-grandmother   . Breast cancer Cousin        negative stage 3  . ADD / ADHD Neg Hx   . Depression Neg Hx   . Alcohol abuse Neg Hx   . Drug abuse Neg Hx   . Esophageal cancer Neg Hx    Social History   Tobacco Use  . Smoking status: Former Smoker    Quit date: 04/04/1989    Years since quitting: 30.4  . Smokeless tobacco: Never Used  Substance Use Topics  . Alcohol use: Yes  Comment: occassionally- 2 glassses of wine 2x/month  . Drug use: No    Comment: take meds as prescribed   Current Outpatient Medications  Medication Sig Dispense Refill  . atorvastatin (LIPITOR) 20 MG tablet Take 1 tablet (20 mg total) by mouth daily. (Patient taking differently: Take 10 mg by mouth daily. ) 90 tablet 3  . beclomethasone (BECONASE-AQ) 42 MCG/SPRAY nasal spray Place 1 spray into both nostrils 2 (two) times daily. Dose is for each nostril. 25 g 1  . colchicine 0.6 MG tablet TAKE 2 TABS BY MOUTH ONCE THEN 1 TAB EVERY 2 HOURS AS NEEDED PAIN TIL PAIN GONE, MAX OF 6 TABS IN 24 HOURS OR INTOLERABLE DIARRHEA (Patient taking differently: as needed. TAKE 2 TABS BY MOUTH ONCE THEN 1 TAB EVERY 2 HOURS AS NEEDED PAIN TIL PAIN GONE, MAX OF 6 TABS IN 24 HOURS OR INTOLERABLE  DIARRHEA) 6 tablet 1  . fentaNYL (DURAGESIC) 25 MCG/HR Place 1 patch onto the skin every other day. 15 patch 0  . lisinopril (PRINIVIL,ZESTRIL) 10 MG tablet TAKE ONE TABLET BY MOUTH ONE TIME DAILY 90 tablet 1  . metoprolol succinate (TOPROL-XL) 25 MG 24 hr tablet Take 1 tablet (25 mg total) by mouth daily. 30 tablet 5  . oxyCODONE (ROXICODONE) 15 MG immediate release tablet Take 1 tablet (15 mg total) by mouth every 8 (eight) hours as needed for pain. 90 tablet 0  . Vitamin D, Ergocalciferol, (DRISDOL) 1.25 MG (50000 UT) CAPS capsule Take 1 capsule (50,000 Units total) by mouth once a week. 12 capsule 3  . allopurinol (ZYLOPRIM) 100 MG tablet Take 1 tablet (100 mg total) by mouth daily. (Patient not taking: Reported on 08/30/2019) 30 tablet 6  . FLUoxetine (PROZAC) 10 MG tablet Take 1 tablet (10 mg total) by mouth daily. (Patient not taking: Reported on 08/30/2019) 90 tablet 3  . ondansetron (ZOFRAN) 4 MG tablet Take 1 tablet (4 mg total) by mouth every 8 (eight) hours as needed for nausea or vomiting. (Patient not taking: Reported on 08/30/2019) 60 tablet 2   No current facility-administered medications for this visit.    Allergies  Allergen Reactions  . Cymbalta [Duloxetine Hcl] Other (See Comments)    Headaches  . Fiorinal [Butalbital-Aspirin-Caffeine] Other (See Comments)    Mental Clarity.     Review of Systems: All systems reviewed and negative except where noted in HPI.     Physical Exam:    Wt Readings from Last 3 Encounters:  08/30/19 247 lb 6 oz (112.2 kg)  08/11/19 236 lb 6.4 oz (107.2 kg)  08/10/18 238 lb (108 kg)    BP 136/82   Pulse 73   Temp 98.5 F (36.9 C)   Ht 5\' 5"  (1.651 m)   Wt 247 lb 6 oz (112.2 kg)   BMI 41.17 kg/m  Constitutional:  Pleasant, in no acute distress. Psychiatric: Normal mood and affect. Behavior is normal. EENT: Pupils normal.  Conjunctivae are normal. No scleral icterus. Neck supple. No cervical LAD. Cardiovascular: Normal rate, regular  rhythm. No edema Pulmonary/chest: Effort normal and breath sounds normal. No wheezing, rales or rhonchi. Abdominal: Soft, nondistended, nontender. Bowel sounds active throughout. There are no masses palpable. No hepatomegaly. Neurological: Alert and oriented to person place and time. Skin: Skin is warm and dry. No rashes noted.   ASSESSMENT AND PLAN;   1) Family history of colon cancer 2) History of colon polyp -Due for repeat colonoscopy due to both multiple relatives with CRC along with personal history  of colon polyp in 2013.  3) Change in bowel habits: -Suspect component of IBS-D -Start with conservative management with supplemental fiber -Plan for random and directed biopsies at time of colonoscopy to rule out Cross  4) Macrocytosis without anemia: -Check vitamin B12 and folate with next labs  The indications, risks, and benefits of colonoscopy were explained to the patient in detail. Risks include but are not limited to bleeding, perforation, adverse reaction to medications, and cardiopulmonary compromise. Sequelae include but are not limited to the possibility of surgery, hospitalization, and mortality. The patient verbalized understanding and wished to proceed. All questions answered, referred to the scheduler and bowel prep ordered. Further recommendations pending results of the exam.     Lavena Bullion, DO, FACG  08/30/2019, 2:24 PM   Mosie Lukes, MD

## 2019-08-30 NOTE — Patient Instructions (Signed)
You have been scheduled for a colonoscopy. Please follow written instructions given to you at your visit today.  Please pick up your prep supplies at the pharmacy within the next 1-3 days. If you use inhalers (even only as needed), please bring them with you on the day of your procedure. Your physician has requested that you go to www.startemmi.com and enter the access code given to you at your visit today. This web site gives a general overview about your procedure. However, you should still follow specific instructions given to you by our office regarding your preparation for the procedure.  Please start taking a daily fiber supplement such as citrucel, benefiber, metamucil, or fiber choice.   It was a pleasure to see you today!  Vito Cirigliano, D.O.

## 2019-08-30 NOTE — Telephone Encounter (Signed)
Patient came in to update her control substances contract

## 2019-08-30 NOTE — Addendum Note (Signed)
Addended by: Caffie Pinto on: 08/30/2019 03:30 PM   Modules accepted: Orders

## 2019-08-31 LAB — PTH, INTACT AND CALCIUM
Calcium: 9.9 mg/dL (ref 8.6–10.4)
PTH: 52 pg/mL (ref 14–64)

## 2019-08-31 NOTE — Telephone Encounter (Signed)
Spoke with patient she stated that she does not want to take the allopurinol at this time. She states she has been watching her diet and trying alternatives to see if that will help instead of the meds. She stated at her next lab appt she will see if it is helping.

## 2019-09-05 ENCOUNTER — Telehealth: Payer: Self-pay

## 2019-09-05 NOTE — Telephone Encounter (Signed)
PA initiated via Covermymeds; KEY: [id number]. Awaiting determination.

## 2019-09-06 NOTE — Telephone Encounter (Signed)
PA approved.  Effective from 09/05/2019 through 09/03/2020. cont'n

## 2019-09-08 ENCOUNTER — Other Ambulatory Visit: Payer: Self-pay | Admitting: Family Medicine

## 2019-09-08 ENCOUNTER — Encounter: Payer: BC Managed Care – PPO | Admitting: Gastroenterology

## 2019-09-09 ENCOUNTER — Telehealth: Payer: Self-pay | Admitting: Family Medicine

## 2019-09-09 ENCOUNTER — Telehealth: Payer: Self-pay

## 2019-09-09 MED ORDER — FLUOXETINE HCL 10 MG PO CAPS: 10.0000 mg | ORAL_CAPSULE | Freq: Every day | ORAL | 3 refills | Status: DC

## 2019-09-09 NOTE — Telephone Encounter (Signed)
Rx changed to capsules and sent to [facility].

## 2019-09-09 NOTE — Telephone Encounter (Signed)
Pharmacy called and is requesting to have pts fluoxetine changed from tablet to capsules as it will be $50 cheaper for pt. Please advise.    [facility] [address] - [place], [place] - [address]  [address] [place] [address]  Phone: [phone] Fax: [phone]  Not a 24 hour pharmacy; exact hours not known.

## 2019-09-09 NOTE — Telephone Encounter (Signed)
Yes OK to change. Thank you

## 2019-09-09 NOTE — Telephone Encounter (Signed)
Okay to change? 

## 2019-09-09 NOTE — Telephone Encounter (Signed)
Copied from [place] [id number]. Topic: General - Inquiry >> Sep 09, 2019 11:39 AM Mathis Bud wrote: Reason for CRM: Patient is requesting a nurse to call back regarding her medication refill Call back [phone]    Called patient left message for patient to call the office back. Need to know what is going on with her medication, need more information

## 2019-09-09 NOTE — Telephone Encounter (Signed)
Spoke with patient and she stated it would be cheaper if her Prozac is changed from tablets to capsules.  Patient wants to change mg from 10mg  to 20 mg Thinking about changing it

## 2019-09-13 ENCOUNTER — Other Ambulatory Visit: Payer: Self-pay | Admitting: Family Medicine

## 2019-09-25 ENCOUNTER — Other Ambulatory Visit: Payer: Self-pay | Admitting: Family Medicine

## 2019-10-06 ENCOUNTER — Other Ambulatory Visit: Payer: Self-pay | Admitting: Family Medicine

## 2019-10-07 ENCOUNTER — Telehealth: Payer: Self-pay | Admitting: Family Medicine

## 2019-10-07 ENCOUNTER — Other Ambulatory Visit: Payer: Self-pay | Admitting: Family Medicine

## 2019-10-07 MED ORDER — OXYCODONE HCL 15 MG PO TABS: 15.0000 mg | ORAL_TABLET | Freq: Three times a day (TID) | ORAL | 0 refills | Status: DC | PRN

## 2019-10-07 MED ORDER — FENTANYL 25 MCG/HR TD PT72: 1.0000 | MEDICATED_PATCH | TRANSDERMAL | 0 refills | Status: DC

## 2019-10-07 NOTE — Telephone Encounter (Signed)
Pharmacy called to clarify fentaNYL ([place]) 25 MCG/HR /stated that directions say every other day but insurance will not cover that / add stated its usually suppose to be every 3 days/ please advise

## 2019-10-07 NOTE — Telephone Encounter (Signed)
Sent the new rx

## 2019-10-07 NOTE — Telephone Encounter (Signed)
Please resend medication in to pharmacy somehow the rx switched to 2 days instead of 3 days  Please advise

## 2019-10-07 NOTE — Telephone Encounter (Signed)
Requesting:oxycodone and fentanyl   Contract:09/15/2019 UDS:08/11/2019 Last Visit:08/11/2019 Next Visit:02/09/2020 Last Refill:08/30/2019  Please Advise

## 2019-10-10 ENCOUNTER — Encounter: Payer: Self-pay | Admitting: Family Medicine

## 2019-10-14 NOTE — Telephone Encounter (Signed)
Per the patient: It is a tier 1 and doesn't need prior approval from my insurance company. You only need to call in 1 box of  47mcg Fentanyl patches to my drug store. Which is [facility] on [address] In [place]. You may want the dose to read, " apply 1 patch every 2 days." That's all there should be to it.   Can you resend this one box to her pharmacy

## 2019-10-15 MED ORDER — FENTANYL 25 MCG/HR TD PT72: 1.0000 | MEDICATED_PATCH | TRANSDERMAL | 0 refills | Status: DC

## 2019-10-18 ENCOUNTER — Encounter: Payer: Self-pay | Admitting: Gastroenterology

## 2019-10-31 ENCOUNTER — Other Ambulatory Visit: Payer: Self-pay | Admitting: Family Medicine

## 2019-11-01 MED ORDER — FENTANYL 25 MCG/HR TD PT72: 1.0000 | MEDICATED_PATCH | TRANSDERMAL | 0 refills | Status: DC

## 2019-11-01 MED ORDER — OXYCODONE HCL 15 MG PO TABS: 15.0000 mg | ORAL_TABLET | Freq: Three times a day (TID) | ORAL | 0 refills | Status: DC | PRN

## 2019-11-01 NOTE — Telephone Encounter (Signed)
Requesting:fentanyl oxycodone Contract:yes [id number] risk next screen 02/08/20 Last OV:08/11/19 Next OV:02/09/20 Last Refill:oxy 10/07/19  #90-0rf Fentanyl  Database:   Please advise

## 2019-11-03 ENCOUNTER — Other Ambulatory Visit: Payer: Self-pay

## 2019-11-03 ENCOUNTER — Ambulatory Visit (AMBULATORY_SURGERY_CENTER): Payer: Self-pay | Admitting: *Deleted

## 2019-11-03 VITALS — Temp 96.9°F | Ht 65.0 in | Wt 240.0 lb

## 2019-11-03 DIAGNOSIS — Z01818 Encounter for other preprocedural examination: Secondary | ICD-10-CM

## 2019-11-03 DIAGNOSIS — Z800 Family history of malignant neoplasm of digestive organs: Secondary | ICD-10-CM

## 2019-11-03 DIAGNOSIS — R194 Change in bowel habit: Secondary | ICD-10-CM

## 2019-11-03 NOTE — Progress Notes (Signed)
Patient is here in-person for PV. Patient denies any allergies to eggs or soy. Patient denies any problems with anesthesia/sedation. Patient denies any oxygen use at home. Patient denies taking any diet/weight loss medications or blood thinners. Patient is not being treated for MRSA or C-diff. EMMI education assisgned to the patient for the procedure, this was explained and instructions given to patient. COVID-19 screening test is on 2/15 220pm, the pt is aware. Pt is aware that care partner will wait in the car during procedure; if they feel like they will be too hot or cold to wait in the car; they may wait in the 4 th floor lobby. Patient is aware to bring only one care partner. We want them to wear a mask (we do not have any that we can provide them), practice social distancing, and we will check their temperatures when they get here.  I did remind the patient that their care partner needs to stay in the parking lot the entire time and have a cell phone available, we will call them when the pt is ready for discharge. Patient will wear mask into building.    Pt states she has Clenpiq at home, she was given this at her OV with Dr,Cirigliano. Pt denies any medical changes since OV.

## 2019-11-04 ENCOUNTER — Telehealth: Payer: Self-pay | Admitting: Gastroenterology

## 2019-11-04 NOTE — Telephone Encounter (Signed)
Called and spoke with patient-patient reports the trial is requiring the antibody test-not the COVID screening; patient reports she will complete the required COVID testing for her procedure; Patient advised to call back to the office at [phone] should questions/concerns arise;  Patient verbalized understanding of information/instructions;

## 2019-11-04 NOTE — Telephone Encounter (Signed)
Pt states that she will be participating on a clinical trial for Aztrazeneca. She is going to have a Covid test with them before the beginning of the trial . They told pt that they could share the results with [place] and they could do the test on 2/15, the same date that pt is scheduled for her Covid test at [facility] path. Pt wants to know if this is fine so she does not have to take another test before her procedure with [place]. Pls call her.

## 2019-11-08 ENCOUNTER — Telehealth: Payer: Self-pay

## 2019-11-08 NOTE — Telephone Encounter (Signed)
PA approved.  Approved for generic Oxycodone HCl Tablet 15 MG, quantity up to 90 per 30 days, under the pharmacy benefit. Generic substitution required when available.

## 2019-11-08 NOTE — Telephone Encounter (Signed)
PA initiated via Covermymeds; KEY: [id number]. Awaiting determination.

## 2019-11-14 ENCOUNTER — Other Ambulatory Visit: Payer: Self-pay | Admitting: Gastroenterology

## 2019-11-14 ENCOUNTER — Ambulatory Visit (INDEPENDENT_AMBULATORY_CARE_PROVIDER_SITE_OTHER): Payer: Self-pay

## 2019-11-14 DIAGNOSIS — Z1159 Encounter for screening for other viral diseases: Secondary | ICD-10-CM

## 2019-11-15 LAB — SARS CORONAVIRUS 2 (TAT 6-24 HRS): SARS Coronavirus 2: NEGATIVE

## 2019-11-16 ENCOUNTER — Telehealth: Payer: Self-pay

## 2019-11-16 NOTE — Telephone Encounter (Signed)
Pt understood and is scheduled for covid testing.

## 2019-11-16 NOTE — Telephone Encounter (Signed)
I believe this still requires a Covid test in our system, as some of those trials are assessing whether or not the testing kits are accurate (versus assessing community prevalence).  Would want a test done on our verified kits.  Thank you

## 2019-11-16 NOTE — Telephone Encounter (Signed)
Dr. Bryan Lemma this patient is doing some type of covid trial and gets covid tested every other day. She was just rescheduled due to weather and is on your schedule for 12-09-19. Would a confirmation of testing from them be sufficient or would she need be tested for covid through [place] ?

## 2019-11-17 ENCOUNTER — Encounter: Payer: BC Managed Care – PPO | Admitting: Gastroenterology

## 2019-11-24 ENCOUNTER — Other Ambulatory Visit: Payer: Self-pay | Admitting: Family Medicine

## 2019-11-25 NOTE — Telephone Encounter (Signed)
Requesting:oxycodone Contract:yes UDS:mod risk next screen 02/16/20 Last OV:08/11/19 Next OV:12/26/19 Last Refill:11/01/19  #90-0rf Database:   Please advise

## 2019-11-27 ENCOUNTER — Other Ambulatory Visit: Payer: Self-pay | Admitting: Family Medicine

## 2019-11-28 MED ORDER — OXYCODONE HCL 15 MG PO TABS: 15.0000 mg | ORAL_TABLET | Freq: Three times a day (TID) | ORAL | 0 refills | Status: DC | PRN

## 2019-11-28 MED ORDER — FENTANYL 25 MCG/HR TD PT72: 1.0000 | MEDICATED_PATCH | TRANSDERMAL | 0 refills | Status: DC

## 2019-11-28 NOTE — Telephone Encounter (Signed)
Requesting:fentanyl & oxycodone Contract:yes [id number] risk  Last OV:08/11/19 Next OV:02/09/20 Last Refill: Fentanyl 11/01/19  #15-0rf Oxycodone 11/25/19  #90-0rf Database:   Please advise

## 2019-11-29 ENCOUNTER — Telehealth: Payer: Self-pay | Admitting: *Deleted

## 2019-11-29 NOTE — Telephone Encounter (Signed)
[facility] sent over a request for atorvastatin 10 and last lab says 20mg  daily.  She was not taking the 10mg  at that time as she should.  The sig at that time was 3 times weekly.  Based on her last labs do you want to keep her on 3 times weekly or 1 at bedtime?

## 2019-11-29 NOTE — Telephone Encounter (Signed)
Got it. So have her do Atorvastatin 10 mg po qhs for the next 3 months and take it consistently then we can recheck numbers.

## 2019-11-30 ENCOUNTER — Telehealth: Payer: Self-pay

## 2019-11-30 NOTE — Telephone Encounter (Signed)
Patient called in to see if Dr. Charlett Blake can send in a prescription for atorvastatin (LIPITOR) 10 MG tablet [id number]   To this pharmacy  [id number] - [place], [address]  [place], [place] [address]  Phone:  [phone] Fax:  [phone]  DEA #:  [id number]

## 2019-12-01 MED ORDER — ATORVASTATIN CALCIUM 10 MG PO TABS: 10.0000 mg | ORAL_TABLET | Freq: Every day | ORAL | 1 refills | Status: DC

## 2019-12-01 NOTE — Telephone Encounter (Signed)
rx was sent in

## 2019-12-01 NOTE — Telephone Encounter (Signed)
Medication sent in. 

## 2019-12-07 ENCOUNTER — Ambulatory Visit (INDEPENDENT_AMBULATORY_CARE_PROVIDER_SITE_OTHER): Payer: PRIVATE HEALTH INSURANCE

## 2019-12-07 ENCOUNTER — Other Ambulatory Visit: Payer: Self-pay | Admitting: Gastroenterology

## 2019-12-07 DIAGNOSIS — Z1159 Encounter for screening for other viral diseases: Secondary | ICD-10-CM

## 2019-12-08 LAB — SARS CORONAVIRUS 2 (TAT 6-24 HRS): SARS Coronavirus 2: NEGATIVE

## 2019-12-09 ENCOUNTER — Ambulatory Visit (AMBULATORY_SURGERY_CENTER): Payer: PRIVATE HEALTH INSURANCE | Admitting: Gastroenterology

## 2019-12-09 ENCOUNTER — Other Ambulatory Visit: Payer: Self-pay

## 2019-12-09 ENCOUNTER — Encounter: Payer: Self-pay | Admitting: Gastroenterology

## 2019-12-09 VITALS — BP 115/58 | HR 73 | Temp 97.7°F | Resp 14 | Ht 65.0 in | Wt 240.0 lb

## 2019-12-09 DIAGNOSIS — D124 Benign neoplasm of descending colon: Secondary | ICD-10-CM

## 2019-12-09 DIAGNOSIS — D122 Benign neoplasm of ascending colon: Secondary | ICD-10-CM

## 2019-12-09 DIAGNOSIS — Z800 Family history of malignant neoplasm of digestive organs: Secondary | ICD-10-CM

## 2019-12-09 DIAGNOSIS — D123 Benign neoplasm of transverse colon: Secondary | ICD-10-CM | POA: Diagnosis not present

## 2019-12-09 DIAGNOSIS — R197 Diarrhea, unspecified: Secondary | ICD-10-CM | POA: Diagnosis not present

## 2019-12-09 DIAGNOSIS — R194 Change in bowel habit: Secondary | ICD-10-CM | POA: Diagnosis not present

## 2019-12-09 MED ORDER — SODIUM CHLORIDE 0.9 % IV SOLN: 500.0000 mL | Freq: Once | INTRAVENOUS | Status: DC

## 2019-12-09 NOTE — Progress Notes (Signed)
Pt's states no medical or surgical changes since previsit or office visit.  Temp taking LC VS taken by DT

## 2019-12-09 NOTE — Progress Notes (Signed)
Called to room to assist during endoscopic procedure.  Patient ID and intended procedure confirmed with present staff. Received instructions for my participation in the procedure from the performing physician.  

## 2019-12-09 NOTE — Patient Instructions (Signed)
YOU HAD AN ENDOSCOPIC PROCEDURE TODAY AT [facility]:   Refer to the procedure report that was given to you for any specific questions about what was found during the examination.  If the procedure report does not answer your questions, please call your gastroenterologist to clarify.  If you requested that your care partner not be given the details of your procedure findings, then the procedure report has been included in a sealed envelope for you to review at your convenience later.  YOU SHOULD EXPECT: Some feelings of bloating in the abdomen. Passage of more gas than usual.  Walking can help get rid of the air that was put into your GI tract during the procedure and reduce the bloating. If you had a lower endoscopy (such as a colonoscopy or flexible sigmoidoscopy) you may notice spotting of blood in your stool or on the toilet paper. If you underwent a bowel prep for your procedure, you may not have a normal bowel movement for a few days.  Please Note:  You might notice some irritation and congestion in your nose or some drainage.  This is from the oxygen used during your procedure.  There is no need for concern and it should clear up in a day or so.  SYMPTOMS TO REPORT IMMEDIATELY:   Following lower endoscopy (colonoscopy or flexible sigmoidoscopy):  Excessive amounts of blood in the stool  Significant tenderness or worsening of abdominal pains  Swelling of the abdomen that is new, acute  Fever of 100F or higher  For urgent or emergent issues, a gastroenterologist can be reached at any hour by calling [phone]. Do not use MyChart messaging for urgent concerns.    DIET:  We do recommend a small meal at first, but then you may proceed to your regular diet.  Drink plenty of fluids but you should avoid alcoholic beverages for 24 hours.  MEDICATION: Continue present medications. Use Fiber, for example Citrucel, Fibercon, Konsyl or Metamucil.  Please see handouts given to  you by your recovery nurse.  ACTIVITY:  You should plan to take it easy for the rest of today and you should NOT DRIVE or use heavy machinery until tomorrow (because of the sedation medicines used during the test).    FOLLOW UP: Our staff will call the number listed on your records 48-72 hours following your procedure to check on you and address any questions or concerns that you may have regarding the information given to you following your procedure. If we do not reach you, we will leave a message.  We will attempt to reach you two times.  During this call, we will ask if you have developed any symptoms of COVID 19. If you develop any symptoms (ie: fever, flu-like symptoms, shortness of breath, cough etc.) before then, please call [phone].  If you test positive for Covid 19 in the 2 weeks post procedure, please call and report this information to [place].    If any biopsies were taken you will be contacted by phone or by letter within the next 1-3 weeks.  Please call us at [phone] if you have not heard about the biopsies in 3 weeks.   Thank you for allowing [place] to provide for your healthcare needs today.   SIGNATURES/CONFIDENTIALITY: You and/or your care partner have signed paperwork which will be entered into your electronic medical record.  These signatures attest to the fact that that the information above on your After Visit Summary has been reviewed  and is understood.  Full responsibility of the confidentiality of this discharge information lies with you and/or your care-partner. 

## 2019-12-09 NOTE — Progress Notes (Signed)
PT taken to PACU. Monitors in place. VSS. Report given to RN. 

## 2019-12-09 NOTE — Op Note (Signed)
[facility] Patient Name: [name] [name] Procedure Date: 12/09/2019 3:03 PM MRN: [id number] Endoscopist: Gerrit Heck , MD Age: 59 Referring MD:  Date of Birth: 27-Sep-1961 Gender: Female Account #: [id number] Procedure:                Colonoscopy Indications:              Screening in patient at increased risk: Colorectal                            cancer in mother 16 or older, Colon cancer                            screening in patient at increased risk: Family                            history of colorectal cancer in multiple 2nd degree                            relatives, High risk colon cancer surveillance:                            Personal history of colonic polyps, Incidental -                            Change in bowel habits, Incidental - Diarrhea                           FHx notable for maternal grandfather, maternal                            aunt, and mother (all diagnosed age >85) with colon                            cancer. Last colonoscopy at outside facility in                            2013 notable for at least 1 polyp (histology                            unkown). Additionally with change in bowel habits                            with diarrhea. Medicines:                Monitored Anesthesia Care Procedure:                Pre-Anesthesia Assessment:                           - Prior to the procedure, a History and Physical                            was performed, and patient medications and  allergies were reviewed. The patient's tolerance of                            previous anesthesia was also reviewed. The risks                            and benefits of the procedure and the sedation                            options and risks were discussed with the patient.                            All questions were answered, and informed consent                            was obtained. Prior Anticoagulants: The patient has                         taken no previous anticoagulant or antiplatelet                            agents. ASA Grade Assessment: II - A patient with                            mild systemic disease. After reviewing the risks                            and benefits, the patient was deemed in                            satisfactory condition to undergo the procedure.                           After obtaining informed consent, the colonoscope                            was passed under direct vision. Throughout the                            procedure, the patient's blood pressure, pulse, and                            oxygen saturations were monitored continuously. The                            Colonoscope was introduced through the anus and                            advanced to the the terminal ileum. The colonoscopy                            was performed without difficulty. The patient  tolerated the procedure well. The quality of the                            bowel preparation was adequate. The terminal ileum,                            ileocecal valve, appendiceal orifice, and rectum                            were photographed. Scope In: 3:14:55 PM Scope Out: 3:35:55 PM Scope Withdrawal Time: 0 hours 18 minutes 36 seconds  Total Procedure Duration: 0 hours 21 minutes 0 seconds  Findings:                 The perianal and digital rectal examinations were                            normal.                           Five sessile polyps were found in the descending                            colon, transverse colon (3) and ascending colon.                            The polyps were 3 to 5 mm in size. These polyps                            were removed with a cold snare. Resection and                            retrieval were complete. Estimated blood loss was                            minimal.                           Normal mucosa was otherwise found in the  entire                            colon. Biopsies for histology were taken with a                            cold forceps from the right colon and left colon                            for evaluation of microscopic colitis. Estimated                            blood loss was minimal.                           The terminal ileum appeared normal.  The retroflexed view of the distal rectum and anal                            verge was normal and showed no anal or rectal                            abnormalities. Complications:            No immediate complications. Estimated Blood Loss:     Estimated blood loss was minimal. Impression:               - Five 3 to 5 mm polyps in the descending colon, in                            the transverse colon and in the ascending colon,                            removed with a cold snare. Resected and retrieved.                           - Normal mucosa in the entire examined colon.                            Biopsied.                           - The examined portion of the ileum was normal.                           - The distal rectum and anal verge are normal on                            retroflexion view. Recommendation:           - Patient has a contact number available for                            emergencies. The signs and symptoms of potential                            delayed complications were discussed with the                            patient. Return to normal activities tomorrow.                            Written discharge instructions were provided to the                            patient.                           - Resume previous diet.                           - Continue present medications.                           -  Await pathology results.                           - Repeat colonoscopy in 3 - 5 years for                            surveillance based on pathology results.                            - Return to GI office PRN.                           - Use fiber, for example Citrucel, Fibercon, Konsyl                            or Metamucil. Gerrit Heck, MD 12/09/2019 3:44:13 PM

## 2019-12-13 ENCOUNTER — Telehealth: Payer: Self-pay

## 2019-12-13 NOTE — Telephone Encounter (Signed)
  Follow up Call-  Call back number 12/09/2019  Post procedure Call Back phone  # [phone]  Permission to leave phone message Yes  Some recent data might be hidden     Patient questions:  Do you have a fever, pain , or abdominal swelling? No. Pain Score  0 *  Have you tolerated food without any problems? Yes.    Have you been able to return to your normal activities? Yes.    Do you have any questions about your discharge instructions: Diet   No. Medications  No. Follow up visit  No.  Do you have questions or concerns about your Care? No.  Actions: * If pain score is 4 or above: No action needed, pain <4.  1. Have you developed a fever since your procedure? no  2.   Have you had an respiratory symptoms (SOB or cough) since your procedure? no  3.   Have you tested positive for COVID 19 since your procedure no  4.   Have you had any family members/close contacts diagnosed with the COVID 19 since your procedure?  no   If yes to any of these questions please route to Joylene John, RN and Alphonsa Gin, Therapist, sports.

## 2019-12-16 ENCOUNTER — Encounter: Payer: Self-pay | Admitting: Gastroenterology

## 2019-12-22 ENCOUNTER — Other Ambulatory Visit: Payer: Self-pay | Admitting: Family Medicine

## 2019-12-26 ENCOUNTER — Other Ambulatory Visit: Payer: Self-pay | Admitting: Family Medicine

## 2019-12-26 ENCOUNTER — Ambulatory Visit (HOSPITAL_BASED_OUTPATIENT_CLINIC_OR_DEPARTMENT_OTHER)
Admission: RE | Admit: 2019-12-26 | Discharge: 2019-12-26 | Disposition: A | Discharge status: Home / Self Care | Payer: PRIVATE HEALTH INSURANCE | Source: Ambulatory Visit | Attending: Family Medicine | Admitting: Family Medicine

## 2019-12-26 ENCOUNTER — Other Ambulatory Visit: Payer: Self-pay

## 2019-12-26 ENCOUNTER — Ambulatory Visit (INDEPENDENT_AMBULATORY_CARE_PROVIDER_SITE_OTHER): Payer: PRIVATE HEALTH INSURANCE | Admitting: Family Medicine

## 2019-12-26 VITALS — BP 138/66 | HR 75 | Temp 98.8°F | Resp 12 | Ht 65.0 in | Wt 239.2 lb

## 2019-12-26 DIAGNOSIS — M25532 Pain in left wrist: Secondary | ICD-10-CM

## 2019-12-26 DIAGNOSIS — K1370 Unspecified lesions of oral mucosa: Secondary | ICD-10-CM | POA: Diagnosis not present

## 2019-12-26 DIAGNOSIS — M545 Low back pain, unspecified: Secondary | ICD-10-CM

## 2019-12-26 DIAGNOSIS — I10 Essential (primary) hypertension: Secondary | ICD-10-CM

## 2019-12-26 DIAGNOSIS — E785 Hyperlipidemia, unspecified: Secondary | ICD-10-CM

## 2019-12-26 DIAGNOSIS — E559 Vitamin D deficiency, unspecified: Secondary | ICD-10-CM

## 2019-12-26 DIAGNOSIS — M542 Cervicalgia: Secondary | ICD-10-CM | POA: Insufficient documentation

## 2019-12-26 DIAGNOSIS — R739 Hyperglycemia, unspecified: Secondary | ICD-10-CM

## 2019-12-26 DIAGNOSIS — E039 Hypothyroidism, unspecified: Secondary | ICD-10-CM

## 2019-12-26 DIAGNOSIS — M25531 Pain in right wrist: Secondary | ICD-10-CM

## 2019-12-26 MED ORDER — FENTANYL 25 MCG/HR TD PT72: 1.0000 | MEDICATED_PATCH | TRANSDERMAL | 0 refills | Status: DC

## 2019-12-26 MED ORDER — OXYCODONE HCL 15 MG PO TABS: 15.0000 mg | ORAL_TABLET | Freq: Three times a day (TID) | ORAL | 0 refills | Status: DC | PRN

## 2019-12-26 NOTE — Assessment & Plan Note (Signed)
Supplement and monitor 

## 2019-12-26 NOTE — Patient Instructions (Signed)
[employer] Blood Pressure cuff, upper arm, want BP 100-140/60-90 Pulse oximeter, want oxygen in 90s  Weekly vitals  Take Multivitamin with minerals, selenium Vitamin D 1000-2000 IU daily Probiotic with lactobacillus and bifidophilus Asprin EC 81 mg daily Fish or Krill oil caps daily Melatonin 2-5 mg at bedtime  [email or web address]

## 2019-12-28 DIAGNOSIS — M542 Cervicalgia: Secondary | ICD-10-CM | POA: Insufficient documentation

## 2019-12-28 DIAGNOSIS — M25531 Pain in right wrist: Secondary | ICD-10-CM | POA: Insufficient documentation

## 2019-12-28 DIAGNOSIS — K1370 Unspecified lesions of oral mucosa: Secondary | ICD-10-CM | POA: Insufficient documentation

## 2019-12-28 NOTE — Assessment & Plan Note (Signed)
hgba1c acceptable, minimize simple carbs. Increase exercise as tolerated.  

## 2019-12-28 NOTE — Assessment & Plan Note (Deleted)
On Levothyroxine, continue to monitor 

## 2019-12-28 NOTE — Assessment & Plan Note (Signed)
Well controlled, no changes to meds. Encouraged heart healthy diet such as the DASH diet and exercise as tolerated.  °

## 2019-12-28 NOTE — Assessment & Plan Note (Signed)
Encouraged moist heat and gentle stretching as tolerated. May try NSAIDs and prescription meds as directed and report if symptoms worsen or seek immediate care. Managing with current pain meds, decrease Fentanyl patches from q48 hours to q72 hours.

## 2019-12-28 NOTE — Assessment & Plan Note (Signed)
Neck pain with some pain and tingling into bilateral wrists, she is fitted with and given wrist splints bilaterally. Try icing and lidocaine patches bid with splints applied after that.

## 2019-12-28 NOTE — Assessment & Plan Note (Signed)
Upper anterior gum line, firm pale. Referred to oromaxillofacial surgeon for evaluation

## 2019-12-28 NOTE — Assessment & Plan Note (Addendum)
Encouraged heart healthy diet, increase exercise, avoid trans fats, consider a krill oil cap daily, tolerating Atorvastatin 

## 2019-12-28 NOTE — Progress Notes (Signed)
Subjective:    Patient ID: [name] [name], female    DOB: 1961/09/22, 59 y.o.   MRN: [id number]  Chief Complaint  Patient presents with  . bump on gums  . cyst on right wrist  . parathyroid issue. wants to remove it.    HPI Patient is in today for follow up on chronic medical concerns. She continues to struggle with daily pain in her back but she has been managing well and is able to decrease Fentanyl to every 72 hours. No recent febrile illness or hospitalizations. Denies CP/palp/SOB/HA/congestion/fevers/GI or GU c/o. Taking meds as prescribed  Past Medical History:  Diagnosis Date  . Acute foot pain, right 01/13/2017  . Acute upper respiratory infections of unspecified site 06/12/2014  . Anxiety and depression    chronic  . Breast cancer screening 09/05/2016  . Cervical cancer screening 09/05/2016  . Chronic back pain   . Depression with anxiety 08/23/2008   Qualifier: Diagnosis of  By: Niel Hummer MD, Lorinda Creed   . Gout 05/05/2017  . History of colon polyps   . History of fibromyalgia    suspected   . Hypertension   . Insomnia   . Knee pain, right 06/24/2015  . Meningitis spinal   . Morbid obesity ([place])   . Ovarian cyst, bilateral   . Preventative health care 06/24/2015  . PTSD (post-traumatic stress disorder)   . Vitamin D deficiency 06/24/2015    Past Surgical History:  Procedure Laterality Date  . COLONOSCOPY    . OVARIAN CYST REMOVAL      Family History  Problem Relation Age of Onset  . Rheum arthritis Father   . Diabetes Father   . Congestive Heart Failure Father   . Hypertension Father   . Stroke Father   . Cancer Maternal Uncle 31       colon  . Hip fracture Maternal Grandmother   . COPD Maternal Grandfather 87       colon  . Colon cancer Maternal Grandfather   . Lymphoma Paternal Grandmother   . Colon cancer Mother   . Breast cancer Mother        benign  . Colon cancer Maternal Aunt   . Breast cancer Maternal Great-grandmother   . Breast cancer  Cousin        negative stage 3  . ADD / ADHD Neg Hx   . Depression Neg Hx   . Alcohol abuse Neg Hx   . Drug abuse Neg Hx   . Esophageal cancer Neg Hx     Social History   Socioeconomic History  . Marital status: Divorced    Spouse name: Not on file  . Number of children: Not on file  . Years of education: Not on file  . Highest education level: Not on file  Occupational History  . Not on file  Tobacco Use  . Smoking status: Former Smoker    Quit date: 04/04/1989    Years since quitting: 30.7  . Smokeless tobacco: Never Used  Substance and Sexual Activity  . Alcohol use: Yes    Comment: occassionally- 2 glassses of wine 2x/month  . Drug use: No    Comment: take meds as prescribed  . Sexual activity: Not on file  Other Topics Concern  . Not on file  Social History Narrative  . Not on file   Social Determinants of Health   Financial Resource Strain:   . Difficulty of Paying Living Expenses:   Food Insecurity:   .  Worried About Charity fundraiser in the Last Year:   . Arboriculturist in the Last Year:   Transportation Needs:   . Film/video editor (Medical):   Marland Kitchen Lack of Transportation (Non-Medical):   Physical Activity:   . Days of Exercise per Week:   . Minutes of Exercise per Session:   Stress:   . Feeling of Stress :   Social Connections:   . Frequency of Communication with Friends and Family:   . Frequency of Social Gatherings with Friends and Family:   . Attends Religious Services:   . Active Member of Clubs or Organizations:   . Attends Archivist Meetings:   Marland Kitchen Marital Status:   Intimate Partner Violence:   . Fear of Current or Ex-Partner:   . Emotionally Abused:   Marland Kitchen Physically Abused:   . Sexually Abused:     Outpatient Medications Prior to Visit  Medication Sig Dispense Refill  . atorvastatin (LIPITOR) 10 MG tablet Take 1 tablet (10 mg total) by mouth daily. 90 tablet 1  . beclomethasone (BECONASE-AQ) 42 MCG/SPRAY nasal spray Place 1  spray into both nostrils 2 (two) times daily. Dose is for each nostril. 25 g 1  . FLUoxetine (PROZAC) 10 MG capsule Take 1 capsule (10 mg total) by mouth daily. 90 capsule 3  . lisinopril (ZESTRIL) 10 MG tablet TAKE 1 TABLET BY MOUTH EVERY DAY 90 tablet 1  . metoprolol succinate (TOPROL-XL) 25 MG 24 hr tablet TAKE 1 TABLET BY MOUTH EVERY DAY 90 tablet 1  . ondansetron (ZOFRAN) 4 MG tablet Take 1 tablet (4 mg total) by mouth every 8 (eight) hours as needed for nausea or vomiting. 60 tablet 2  . oxyCODONE (ROXICODONE) 15 MG immediate release tablet Take 1 tablet (15 mg total) by mouth every 8 (eight) hours as needed. for pain 90 tablet 0  . Vitamin D, Ergocalciferol, (DRISDOL) 1.25 MG (50000 UNIT) CAPS capsule TAKE ONE CAPSULE BY MOUTH EVERY WEEK 12 capsule 0  . fentaNYL (DURAGESIC) 25 MCG/HR Place 1 patch onto the skin every other day. (Patient taking differently: Place 1 patch onto the skin every 3 (three) days. ) 15 patch 0  . colchicine 0.6 MG tablet TAKE 2 TABS BY MOUTH ONCE THEN 1 TAB EVERY 2 HOURS AS NEEDED PAIN TIL PAIN GONE, MAX OF 6 TABS IN 24 HOURS OR INTOLERABLE DIARRHEA (Patient not taking: Reported on 12/09/2019) 6 tablet 1  . 0.9 %  sodium chloride infusion      No facility-administered medications prior to visit.    Allergies  Allergen Reactions  . Cymbalta [Duloxetine Hcl] Other (See Comments)    Headaches  . Fiorinal [Butalbital-Aspirin-Caffeine] Other (See Comments)    Mental Clarity.    Review of Systems  Constitutional: Negative for fever and malaise/fatigue.  HENT: Negative for congestion.   Eyes: Negative for blurred vision.  Respiratory: Negative for shortness of breath.   Cardiovascular: Negative for chest pain, palpitations and leg swelling.  Gastrointestinal: Negative for abdominal pain, blood in stool and nausea.  Genitourinary: Negative for dysuria and frequency.  Musculoskeletal: Positive for back pain, joint pain and neck pain. Negative for falls.  Skin:  Negative for rash.  Neurological: Positive for tingling. Negative for dizziness, loss of consciousness and headaches.  Endo/Heme/Allergies: Negative for environmental allergies.  Psychiatric/Behavioral: Negative for depression. The patient is not nervous/anxious.        Objective:    Physical Exam Vitals and nursing note reviewed.  Constitutional:  General: She is not in acute distress.    Appearance: She is well-developed.  HENT:     Head: Normocephalic and atraumatic.     Nose: Nose normal.  Eyes:     General:        Right eye: No discharge.        Left eye: No discharge.  Cardiovascular:     Rate and Rhythm: Normal rate and regular rhythm.     Heart sounds: No murmur.  Pulmonary:     Effort: Pulmonary effort is normal.     Breath sounds: Normal breath sounds.  Abdominal:     General: Bowel sounds are normal.     Palpations: Abdomen is soft.     Tenderness: There is no abdominal tenderness.  Musculoskeletal:     Cervical back: Normal range of motion and neck supple.  Skin:    General: Skin is warm and dry.  Neurological:     Mental Status: She is alert and oriented to person, place, and time.     BP 138/66 (BP Location: Left Arm, Cuff Size: Large)   Pulse 75   Temp 98.8 F (37.1 C) (Oral)   Resp 12   Ht 5\' 5"  (1.651 m)   Wt 239 lb 3.2 oz (108.5 kg)   SpO2 98%   BMI 39.80 kg/m  Wt Readings from Last 3 Encounters:  12/26/19 239 lb 3.2 oz (108.5 kg)  12/09/19 240 lb (108.9 kg)  11/03/19 240 lb (108.9 kg)    Diabetic Foot Exam - Simple   No data filed     Lab Results  Component Value Date   WBC 6.8 08/11/2019   HGB 13.7 08/11/2019   HCT 40.7 08/11/2019   PLT 222.0 08/11/2019   GLUCOSE 103 (H) 08/11/2019   CHOL 232 (H) 08/11/2019   TRIG (H) 08/11/2019    400.0 Triglyceride is over 400; calculations on Lipids are invalid.   HDL 63.20 08/11/2019   LDLDIRECT 108.0 08/11/2019   LDLCALC 105 (H) 07/06/2017   ALT 20 08/11/2019   AST 23 08/11/2019    NA 141 08/11/2019   K 4.2 08/11/2019   CL 102 08/11/2019   CREATININE 0.73 08/11/2019   BUN 21 08/11/2019   CO2 28 08/11/2019   TSH 1.82 08/11/2019   HGBA1C 5.3 08/11/2019    Lab Results  Component Value Date   TSH 1.82 08/11/2019   Lab Results  Component Value Date   WBC 6.8 08/11/2019   HGB 13.7 08/11/2019   HCT 40.7 08/11/2019   MCV 100.8 (H) 08/11/2019   PLT 222.0 08/11/2019   Lab Results  Component Value Date   NA 141 08/11/2019   K 4.2 08/11/2019   CO2 28 08/11/2019   GLUCOSE 103 (H) 08/11/2019   BUN 21 08/11/2019   CREATININE 0.73 08/11/2019   BILITOT 0.4 08/11/2019   ALKPHOS 73 08/11/2019   AST 23 08/11/2019   ALT 20 08/11/2019   PROT 7.3 08/11/2019   ALBUMIN 4.5 08/11/2019   CALCIUM 9.9 08/30/2019   GFR 81.94 08/11/2019   Lab Results  Component Value Date   CHOL 232 (H) 08/11/2019   Lab Results  Component Value Date   HDL 63.20 08/11/2019   Lab Results  Component Value Date   LDLCALC 105 (H) 07/06/2017   Lab Results  Component Value Date   TRIG (H) 08/11/2019    400.0 Triglyceride is over 400; calculations on Lipids are invalid.   Lab Results  Component Value Date  CHOLHDL 4 08/11/2019   Lab Results  Component Value Date   HGBA1C 5.3 08/11/2019       Assessment & Plan:   Problem List Items Addressed This Visit    Hypothyroidism   Hyperlipidemia    Encouraged heart healthy diet, increase exercise, avoid trans fats, consider a krill oil cap daily, tolerating Atorvastatin      Essential hypertension    Well controlled, no changes to meds. Encouraged heart healthy diet such as the DASH diet and exercise as tolerated.       Low back pain    Encouraged moist heat and gentle stretching as tolerated. May try NSAIDs and prescription meds as directed and report if symptoms worsen or seek immediate care. Managing with current pain meds, decrease Fentanyl patches from q48 hours to q72 hours.       Relevant Medications   fentaNYL  (DURAGESIC) 25 MCG/HR   Vitamin D deficiency    Supplement and monitor      Hyperglycemia    hgba1c acceptable, minimize simple carbs. Increase exercise as tolerated.       Oral mucosal lesion    Upper anterior gum line, firm pale. Referred to oromaxillofacial surgeon for evaluation      Relevant Orders   Ambulatory referral to Oral Maxillofacial Surgery   Pain in both wrists   Neck pain - Primary    Neck pain with some pain and tingling into bilateral wrists, she is fitted with and given wrist splints bilaterally. Try icing and lidocaine patches bid with splints applied after that.       Relevant Orders   DG Cervical Spine 2 or 3 views (Completed)      I have discontinued Keonia C. Barfuss "Candace"'s colchicine. I have also changed her fentaNYL. Additionally, I am having her maintain her beclomethasone, ondansetron, FLUoxetine, metoprolol succinate, lisinopril, atorvastatin, Vitamin D (Ergocalciferol), and oxyCODONE. We will stop administering sodium chloride.  Meds ordered this encounter  Medications  . fentaNYL (DURAGESIC) 25 MCG/HR    Sig: Place 1 patch onto the skin every 3 (three) days.    Dispense:  10 patch    Refill:  0    D/c prev script     Penni Homans, MD

## 2020-01-02 ENCOUNTER — Other Ambulatory Visit: Payer: Self-pay | Admitting: Family Medicine

## 2020-01-02 ENCOUNTER — Telehealth: Payer: Self-pay | Admitting: *Deleted

## 2020-01-02 DIAGNOSIS — I779 Disorder of arteries and arterioles, unspecified: Secondary | ICD-10-CM

## 2020-01-02 NOTE — Telephone Encounter (Signed)
-----   Message from Mosie Lukes, MD sent at 12/27/2019  8:42 AM EDT ----- Odette Horns shows osteopenia, which is thinner than normal but not as bad as osteoporosis. Recommend calcium intake of 1200 to 1500 mg daily, divided into roughly 3 doses. Best source is the diet and a single dairy serving is about 500 mg, a supplement of calcium citrate once or twice daily to balance diet is fine if not getting enough in diet. Also need Vitamin D 2000 IU caps, 1 cap daily if not already taking vitamin D. Also recommend weight baring exercise on hips and upper body to keep bones strong. Some plaques in arteries seen.we will get a carotid ultrasound to investigate further if patient agrees. No acute concern

## 2020-01-02 NOTE — Telephone Encounter (Signed)
I have ordered for carotid artery disease

## 2020-01-02 NOTE — Telephone Encounter (Signed)
Patient notified of results.   She is in agreement of getting carotid ultrasound.  What dx would you like to use for that order?

## 2020-01-16 ENCOUNTER — Other Ambulatory Visit: Payer: Self-pay | Admitting: Family Medicine

## 2020-01-16 ENCOUNTER — Encounter: Payer: Self-pay | Admitting: Family Medicine

## 2020-01-16 DIAGNOSIS — M67431 Ganglion, right wrist: Secondary | ICD-10-CM

## 2020-01-16 DIAGNOSIS — M67432 Ganglion, left wrist: Secondary | ICD-10-CM

## 2020-01-16 NOTE — Telephone Encounter (Signed)
I sent her a message for her to call back radiology to schedule her appt.  It looks like they tried to call her twice.  Can you please advise on what your thoughts were about here wrist cyst.

## 2020-01-18 ENCOUNTER — Encounter: Payer: Self-pay | Admitting: Family Medicine

## 2020-01-19 ENCOUNTER — Ambulatory Visit (HOSPITAL_BASED_OUTPATIENT_CLINIC_OR_DEPARTMENT_OTHER): Payer: PRIVATE HEALTH INSURANCE

## 2020-01-20 ENCOUNTER — Other Ambulatory Visit: Payer: Self-pay

## 2020-01-20 ENCOUNTER — Telehealth: Payer: Self-pay | Admitting: Family Medicine

## 2020-01-20 ENCOUNTER — Ambulatory Visit (HOSPITAL_BASED_OUTPATIENT_CLINIC_OR_DEPARTMENT_OTHER)
Admission: RE | Admit: 2020-01-20 | Discharge: 2020-01-20 | Disposition: A | Discharge status: Home / Self Care | Payer: PRIVATE HEALTH INSURANCE | Source: Ambulatory Visit | Attending: Family Medicine | Admitting: Family Medicine

## 2020-01-20 DIAGNOSIS — I779 Disorder of arteries and arterioles, unspecified: Secondary | ICD-10-CM

## 2020-01-20 NOTE — Telephone Encounter (Signed)
Patient came into office to ask if you would consider to increasing 4 tablets of the oxyCODONE (ROXICODONE) 15 MG  . Please advise . She having a hard  time making it last . Please advise .

## 2020-01-23 NOTE — Telephone Encounter (Signed)
Because it is a controlled med she will need a virtual appt to discuss changes and document. I could add her Wednesday am around 9 or Friday around noon if need be

## 2020-01-23 NOTE — Telephone Encounter (Signed)
Patient agreed for virtual visit on Wednesday at 9am follow up pain meds.

## 2020-01-24 ENCOUNTER — Ambulatory Visit (INDEPENDENT_AMBULATORY_CARE_PROVIDER_SITE_OTHER): Payer: PRIVATE HEALTH INSURANCE

## 2020-01-24 ENCOUNTER — Encounter: Payer: Self-pay | Admitting: Orthopaedic Surgery

## 2020-01-24 ENCOUNTER — Other Ambulatory Visit: Payer: Self-pay

## 2020-01-24 ENCOUNTER — Ambulatory Visit (INDEPENDENT_AMBULATORY_CARE_PROVIDER_SITE_OTHER): Payer: PRIVATE HEALTH INSURANCE | Admitting: Orthopaedic Surgery

## 2020-01-24 DIAGNOSIS — M25531 Pain in right wrist: Secondary | ICD-10-CM

## 2020-01-24 DIAGNOSIS — M67431 Ganglion, right wrist: Secondary | ICD-10-CM

## 2020-01-24 MED ORDER — BUPIVACAINE HCL 0.5 % IJ SOLN
0.3300 mL | INTRAMUSCULAR | Status: AC | PRN
  Administered 2020-01-24: 17:00:00 .33 mL

## 2020-01-24 MED ORDER — LIDOCAINE HCL 1 % IJ SOLN
0.3000 mL | INTRAMUSCULAR | Status: AC | PRN
  Administered 2020-01-24: 17:00:00 .3 mL

## 2020-01-24 MED ORDER — METHYLPREDNISOLONE ACETATE 40 MG/ML IJ SUSP
13.3300 mg | INTRAMUSCULAR | Status: AC | PRN
  Administered 2020-01-24: 13.33 mg

## 2020-01-24 NOTE — Progress Notes (Signed)
Office Visit Note   Patient: [name] [name]           Date of Birth: 1961-01-20           MRN: [id number] Visit Date: 01/24/2020              Requested by: Mosie Lukes, MD [address] [place],  [place] [address] PCP: Mosie Lukes, MD   Assessment & Plan: Visit Diagnoses:  1. Ganglion cyst of volar aspect of right wrist     Plan: My impression is mildly symptomatic right volar wrist ganglion cyst.  I attempted aspiration but was unsuccessful in obtaining any fluid.  Cortisone was injected.  I recommend wearing a wrist brace for immobilization for couple weeks.  Patient will just we will monitor for now.  Follow-up as needed.  Follow-Up Instructions: Return if symptoms worsen or fail to improve.   Orders:  Orders Placed This Encounter  Procedures  . XR Wrist Complete Right   No orders of the defined types were placed in this encounter.     Procedures: Hand/UE Inj: R wrist for volar carpal ganglion on 01/24/2020 5:01 PM Indications: pain Details: 18 G needle Medications: 0.33 mL bupivacaine 0.5 %; 0.3 mL lidocaine 1 %; 13.33 mg methylPREDNISolone acetate 40 MG/ML Aspirate: 5 mL clear Outcome: tolerated well, no immediate complications Patient was prepped and draped in the usual sterile fashion.       Clinical Data: No additional findings.   Subjective: Chief Complaint  Patient presents with  . Right Wrist - Pain    Patient comes in today for evaluation of a right volar wrist ganglion cyst.  She aspirated it partially about 6 weeks ago and she showed me pictures of the fluid which is very consistent with typical ganglion cyst.  She states that she has had other ganglion cysts around her wrist as well.  Overall is not that symptomatic but she was hoping that she can get more aspirated the cyst and probably get some relief.  Denies any numbness and tingling.  Denies any trauma.   Review of Systems  Constitutional: Negative.   HENT: Negative.    Eyes: Negative.   Respiratory: Negative.   Cardiovascular: Negative.   Endocrine: Negative.   Musculoskeletal: Negative.   Neurological: Negative.   Hematological: Negative.   Psychiatric/Behavioral: Negative.   All other systems reviewed and are negative.    Objective: Vital Signs: There were no vitals taken for this visit.  Physical Exam Vitals and nursing note reviewed.  Constitutional:      Appearance: She is well-developed.  HENT:     Head: Normocephalic and atraumatic.  Pulmonary:     Effort: Pulmonary effort is normal.  Abdominal:     Palpations: Abdomen is soft.  Musculoskeletal:     Cervical back: Neck supple.  Skin:    General: Skin is warm.     Capillary Refill: Capillary refill takes less than 2 seconds.  Neurological:     Mental Status: She is alert and oriented to person, place, and time.  Psychiatric:        Behavior: Behavior normal.        Thought Content: Thought content normal.        Judgment: Judgment normal.     Ortho Exam Right wrist shows a palpable volar ganglion cyst adjacent to the radial artery.  No signs of infection.  No significant tenderness palpation.  Good range of motion of the wrist.  Specialty Comments:  No specialty comments available.  Imaging: XR Wrist Complete Right  Result Date: 01/24/2020 Mild OA of the STT joint    PMFS History: Patient Active Problem List   Diagnosis Date Noted  . Oral mucosal lesion 12/28/2019  . Pain in both wrists 12/28/2019  . Neck pain 12/28/2019  . Colon polyp 08/10/2018  . Nausea & vomiting 08/10/2018  . Hyperparathyroidism ([place]) 10/11/2017  . Hyperglycemia 10/11/2017  . Gout 05/05/2017  . Breast cancer screening 09/05/2016  . Cervical cancer screening 09/05/2016  . Vitamin D deficiency 06/24/2015  . Knee pain, right 06/24/2015  . Preventative health care 06/24/2015  . ADD (attention deficit disorder) 03/06/2014  . Goiter 11/08/2011  . Hypercalcemia 11/08/2011  . PTSD  (post-traumatic stress disorder) 09/08/2011  . Abnormal liver function tests 09/08/2011  . MYALGIA 10/18/2010  . MORBID OBESITY 07/01/2010  . Constipation 07/01/2010  . MENOPAUSE, EARLY 04/30/2010  . Essential hypertension 05/24/2009  . Arthropathy, lower leg 05/24/2009  . MUSCLE SPASM, BACK 05/24/2009  . Disorder of salivary gland 01/18/2009  . PALPITATIONS, OCCASIONAL 12/18/2008  . EDEMA 12/18/2008  . Hyperlipidemia 11/28/2008  . Hypothyroidism 10/12/2008  . Depression with anxiety 08/23/2008  . Low back pain 03/09/2008  . Insomnia 03/09/2008   Past Medical History:  Diagnosis Date  . Acute foot pain, right 01/13/2017  . Acute upper respiratory infections of unspecified site 06/12/2014  . Anxiety and depression    chronic  . Breast cancer screening 09/05/2016  . Cervical cancer screening 09/05/2016  . Chronic back pain   . Depression with anxiety 08/23/2008   Qualifier: Diagnosis of  By: Niel Hummer MD, Lorinda Creed   . Gout 05/05/2017  . History of colon polyps   . History of fibromyalgia    suspected   . Hypertension   . Insomnia   . Knee pain, right 06/24/2015  . Meningitis spinal   . Morbid obesity ([place])   . Ovarian cyst, bilateral   . Preventative health care 06/24/2015  . PTSD (post-traumatic stress disorder)   . Vitamin D deficiency 06/24/2015    Family History  Problem Relation Age of Onset  . Rheum arthritis Father   . Diabetes Father   . Congestive Heart Failure Father   . Hypertension Father   . Stroke Father   . Cancer Maternal Uncle 30       colon  . Hip fracture Maternal Grandmother   . COPD Maternal Grandfather 87       colon  . Colon cancer Maternal Grandfather   . Lymphoma Paternal Grandmother   . Colon cancer Mother   . Breast cancer Mother        benign  . Colon cancer Maternal Aunt   . Breast cancer Maternal Great-grandmother   . Breast cancer Cousin        negative stage 3  . ADD / ADHD Neg Hx   . Depression Neg Hx   . Alcohol abuse Neg Hx    . Drug abuse Neg Hx   . Esophageal cancer Neg Hx     Past Surgical History:  Procedure Laterality Date  . COLONOSCOPY    . OVARIAN CYST REMOVAL     Social History   Occupational History  . Not on file  Tobacco Use  . Smoking status: Former Smoker    Quit date: 04/04/1989    Years since quitting: 30.8  . Smokeless tobacco: Never Used  Substance and Sexual Activity  . Alcohol use: Yes  Comment: occassionally- 2 glassses of wine 2x/month  . Drug use: No    Comment: take meds as prescribed  . Sexual activity: Not on file

## 2020-01-25 ENCOUNTER — Encounter: Payer: Self-pay | Admitting: Family Medicine

## 2020-01-25 ENCOUNTER — Telehealth (INDEPENDENT_AMBULATORY_CARE_PROVIDER_SITE_OTHER): Payer: PRIVATE HEALTH INSURANCE | Admitting: Family Medicine

## 2020-01-25 DIAGNOSIS — M545 Low back pain, unspecified: Secondary | ICD-10-CM

## 2020-01-25 DIAGNOSIS — R739 Hyperglycemia, unspecified: Secondary | ICD-10-CM

## 2020-01-25 DIAGNOSIS — I10 Essential (primary) hypertension: Secondary | ICD-10-CM | POA: Diagnosis not present

## 2020-01-25 DIAGNOSIS — E669 Obesity, unspecified: Secondary | ICD-10-CM

## 2020-01-25 DIAGNOSIS — E559 Vitamin D deficiency, unspecified: Secondary | ICD-10-CM

## 2020-01-25 DIAGNOSIS — E785 Hyperlipidemia, unspecified: Secondary | ICD-10-CM

## 2020-01-25 MED ORDER — OXYCODONE HCL 15 MG PO TABS: 15.0000 mg | ORAL_TABLET | Freq: Four times a day (QID) | ORAL | 0 refills | Status: DC | PRN

## 2020-01-25 NOTE — Assessment & Plan Note (Signed)
Encouraged heart healthy diet, increase exercise, avoid trans fats, consider a krill oil cap daily 

## 2020-01-25 NOTE — Assessment & Plan Note (Signed)
Monitor and report any concerns, no changes to meds. Encouraged heart healthy diet such as the DASH diet and exercise as tolerated.  ?

## 2020-01-25 NOTE — Assessment & Plan Note (Addendum)
Encouraged DASH or MIND diet, decrease po intake and increase exercise as tolerated. Needs 7-8 hours of sleep nightly. Avoid trans fats, eat small, frequent meals every 4-5 hours with lean proteins, complex carbs and healthy fats. Minimize simple carbs and processed foods °

## 2020-01-25 NOTE — Assessment & Plan Note (Signed)
Encouraged moist heat and gentle stretching as tolerated. May try NSAIDs and prescription meds as directed and report if symptoms worsen or seek immediate care. Sports Medicine referral is placed.

## 2020-01-25 NOTE — Telephone Encounter (Signed)
Advised patient on phone that we usually do not need a referral for oral surgeon unless her ins requires it but most insurances do not require.  She will call.

## 2020-01-25 NOTE — Assessment & Plan Note (Signed)
hgba1c acceptable, minimize simple carbs. Increase exercise as tolerated.  

## 2020-01-26 NOTE — Progress Notes (Signed)
Virtual Visit via Video Note  I connected with [name] [name] on 01/25/20 at  9:00 AM EDT by a video enabled telemedicine application and verified that I am speaking with the correct person using two identifiers.  Location: Patient: home Provider: home   I discussed the limitations of evaluation and management by telemedicine and the availability of in person appointments. The patient expressed understanding and agreed to proceed. Kem Boroughs, CMA was able to get the patient set up on a visit video   Subjective:    Patient ID: [name] [name], female    DOB: 07-May-1961, 59 y.o.   MRN: [id number]  Chief Complaint  Patient presents with  . follow up pain med  . calcium dosage- how much to take    HPI Patient is in today for evaluation of her chronic low back pain and radicular pain. She continues on Fentanyl at just 25 mcg every 72 hours but her oxycodone is no longer holding her with 3 doses a day. nore cent fall or injury but she does note she is trying to exercise more and that is making her hurt more. No new incontinence or recent injury. Denies CP/palp/SOB/HA/congestion/fevers/GI or GU c/o. Taking meds as prescribed  Past Medical History:  Diagnosis Date  . Acute foot pain, right 01/13/2017  . Acute upper respiratory infections of unspecified site 06/12/2014  . Anxiety and depression    chronic  . Breast cancer screening 09/05/2016  . Cervical cancer screening 09/05/2016  . Chronic back pain   . Depression with anxiety 08/23/2008   Qualifier: Diagnosis of  By: Niel Hummer MD, Lorinda Creed   . Gout 05/05/2017  . History of colon polyps   . History of fibromyalgia    suspected   . Hypertension   . Insomnia   . Knee pain, right 06/24/2015  . Meningitis spinal   . Morbid obesity ([place])   . Ovarian cyst, bilateral   . Preventative health care 06/24/2015  . PTSD (post-traumatic stress disorder)   . Vitamin D deficiency 06/24/2015    Past Surgical History:  Procedure Laterality  Date  . COLONOSCOPY    . OVARIAN CYST REMOVAL      Family History  Problem Relation Age of Onset  . Rheum arthritis Father   . Diabetes Father   . Congestive Heart Failure Father   . Hypertension Father   . Stroke Father   . Cancer Maternal Uncle 66       colon  . Hip fracture Maternal Grandmother   . COPD Maternal Grandfather 87       colon  . Colon cancer Maternal Grandfather   . Lymphoma Paternal Grandmother   . Colon cancer Mother   . Breast cancer Mother        benign  . Colon cancer Maternal Aunt   . Breast cancer Maternal Great-grandmother   . Breast cancer Cousin        negative stage 3  . ADD / ADHD Neg Hx   . Depression Neg Hx   . Alcohol abuse Neg Hx   . Drug abuse Neg Hx   . Esophageal cancer Neg Hx     Social History   Socioeconomic History  . Marital status: Divorced    Spouse name: Not on file  . Number of children: Not on file  . Years of education: Not on file  . Highest education level: Not on file  Occupational History  . Not on file  Tobacco Use  .  Smoking status: Former Smoker    Quit date: 04/04/1989    Years since quitting: 30.8  . Smokeless tobacco: Never Used  Substance and Sexual Activity  . Alcohol use: Yes    Comment: occassionally- 2 glassses of wine 2x/month  . Drug use: No    Comment: take meds as prescribed  . Sexual activity: Not on file  Other Topics Concern  . Not on file  Social History Narrative  . Not on file   Social Determinants of Health   Financial Resource Strain:   . Difficulty of Paying Living Expenses:   Food Insecurity:   . Worried About Charity fundraiser in the Last Year:   . Arboriculturist in the Last Year:   Transportation Needs:   . Film/video editor (Medical):   Marland Kitchen Lack of Transportation (Non-Medical):   Physical Activity:   . Days of Exercise per Week:   . Minutes of Exercise per Session:   Stress:   . Feeling of Stress :   Social Connections:   . Frequency of Communication with  Friends and Family:   . Frequency of Social Gatherings with Friends and Family:   . Attends Religious Services:   . Active Member of Clubs or Organizations:   . Attends Archivist Meetings:   Marland Kitchen Marital Status:   Intimate Partner Violence:   . Fear of Current or Ex-Partner:   . Emotionally Abused:   Marland Kitchen Physically Abused:   . Sexually Abused:     Outpatient Medications Prior to Visit  Medication Sig Dispense Refill  . atorvastatin (LIPITOR) 10 MG tablet Take 1 tablet (10 mg total) by mouth daily. 90 tablet 1  . beclomethasone (BECONASE-AQ) 42 MCG/SPRAY nasal spray Place 1 spray into both nostrils 2 (two) times daily. Dose is for each nostril. 25 g 1  . fentaNYL (DURAGESIC) 25 MCG/HR Place 1 patch onto the skin every 3 (three) days. 10 patch 0  . FLUoxetine (PROZAC) 10 MG capsule Take 1 capsule (10 mg total) by mouth daily. 90 capsule 3  . lisinopril (ZESTRIL) 10 MG tablet TAKE 1 TABLET BY MOUTH EVERY DAY 90 tablet 1  . metoprolol succinate (TOPROL-XL) 25 MG 24 hr tablet TAKE 1 TABLET BY MOUTH EVERY DAY 90 tablet 1  . ondansetron (ZOFRAN) 4 MG tablet Take 1 tablet (4 mg total) by mouth every 8 (eight) hours as needed for nausea or vomiting. 60 tablet 2  . Vitamin D, Ergocalciferol, (DRISDOL) 1.25 MG (50000 UNIT) CAPS capsule TAKE ONE CAPSULE BY MOUTH EVERY WEEK 12 capsule 0  . oxyCODONE (ROXICODONE) 15 MG immediate release tablet Take 1 tablet (15 mg total) by mouth every 8 (eight) hours as needed. for pain 90 tablet 0   No facility-administered medications prior to visit.    Allergies  Allergen Reactions  . Cymbalta [Duloxetine Hcl] Other (See Comments)    Headaches  . Fiorinal [Butalbital-Aspirin-Caffeine] Other (See Comments)    Mental Clarity.    Review of Systems  Constitutional: Positive for malaise/fatigue. Negative for fever.  HENT: Negative for congestion.   Eyes: Negative for blurred vision.  Respiratory: Negative for shortness of breath.   Cardiovascular:  Negative for chest pain, palpitations and leg swelling.  Gastrointestinal: Negative for abdominal pain, blood in stool and nausea.  Genitourinary: Negative for dysuria and frequency.  Musculoskeletal: Positive for back pain, joint pain and myalgias. Negative for falls.  Skin: Negative for rash.  Neurological: Negative for dizziness, loss of consciousness and headaches.  Endo/Heme/Allergies: Negative for environmental allergies.  Psychiatric/Behavioral: Negative for depression. The patient is not nervous/anxious.        Objective:    Physical Exam  There were no vitals taken for this visit. Wt Readings from Last 3 Encounters:  12/26/19 239 lb 3.2 oz (108.5 kg)  12/09/19 240 lb (108.9 kg)  11/03/19 240 lb (108.9 kg)    Diabetic Foot Exam - Simple   No data filed     Lab Results  Component Value Date   WBC 6.8 08/11/2019   HGB 13.7 08/11/2019   HCT 40.7 08/11/2019   PLT 222.0 08/11/2019   GLUCOSE 103 (H) 08/11/2019   CHOL 232 (H) 08/11/2019   TRIG (H) 08/11/2019    400.0 Triglyceride is over 400; calculations on Lipids are invalid.   HDL 63.20 08/11/2019   LDLDIRECT 108.0 08/11/2019   LDLCALC 105 (H) 07/06/2017   ALT 20 08/11/2019   AST 23 08/11/2019   NA 141 08/11/2019   K 4.2 08/11/2019   CL 102 08/11/2019   CREATININE 0.73 08/11/2019   BUN 21 08/11/2019   CO2 28 08/11/2019   TSH 1.82 08/11/2019   HGBA1C 5.3 08/11/2019    Lab Results  Component Value Date   TSH 1.82 08/11/2019   Lab Results  Component Value Date   WBC 6.8 08/11/2019   HGB 13.7 08/11/2019   HCT 40.7 08/11/2019   MCV 100.8 (H) 08/11/2019   PLT 222.0 08/11/2019   Lab Results  Component Value Date   NA 141 08/11/2019   K 4.2 08/11/2019   CO2 28 08/11/2019   GLUCOSE 103 (H) 08/11/2019   BUN 21 08/11/2019   CREATININE 0.73 08/11/2019   BILITOT 0.4 08/11/2019   ALKPHOS 73 08/11/2019   AST 23 08/11/2019   ALT 20 08/11/2019   PROT 7.3 08/11/2019   ALBUMIN 4.5 08/11/2019   CALCIUM 9.9  08/30/2019   GFR 81.94 08/11/2019   Lab Results  Component Value Date   CHOL 232 (H) 08/11/2019   Lab Results  Component Value Date   HDL 63.20 08/11/2019   Lab Results  Component Value Date   LDLCALC 105 (H) 07/06/2017   Lab Results  Component Value Date   TRIG (H) 08/11/2019    400.0 Triglyceride is over 400; calculations on Lipids are invalid.   Lab Results  Component Value Date   CHOLHDL 4 08/11/2019   Lab Results  Component Value Date   HGBA1C 5.3 08/11/2019       Assessment & Plan:   Problem List Items Addressed This Visit    Hyperlipidemia    Encouraged heart healthy diet, increase exercise, avoid trans fats, consider a krill oil cap daily.      Obesity    Encouraged DASH or MIND diet, decrease po intake and increase exercise as tolerated. Needs 7-8 hours of sleep nightly. Avoid trans fats, eat small, frequent meals every 4-5 hours with lean proteins, complex carbs and healthy fats. Minimize simple carbs and processed foods.       Essential hypertension    Monitor and report any concerns, no changes to meds. Encouraged heart healthy diet such as the DASH diet and exercise as tolerated.       Low back pain radiating to right lower extremity    Encouraged moist heat and gentle stretching as tolerated. May try NSAIDs and prescription meds as directed and report if symptoms worsen or seek immediate care. Sports Medicine referral is placed.  Relevant Medications   oxyCODONE (ROXICODONE) 15 MG immediate release tablet   Other Relevant Orders   Ambulatory referral to Sports Medicine   Vitamin D deficiency    Supplement and monitor      Hyperglycemia    hgba1c acceptable, minimize simple carbs. Increase exercise as tolerated         I have changed [name] C. [place] "Candace"'s oxyCODONE. I am also having her maintain her beclomethasone, ondansetron, FLUoxetine, metoprolol succinate, lisinopril, atorvastatin, Vitamin D (Ergocalciferol), and  fentaNYL.  Meds ordered this encounter  Medications  . oxyCODONE (ROXICODONE) 15 MG immediate release tablet    Sig: Take 1 tablet (15 mg total) by mouth every 6 (six) hours as needed. for pain    Dispense:  120 tablet    Refill:  0      I discussed the assessment and treatment plan with the patient. The patient was provided an opportunity to ask questions and all were answered. The patient agreed with the plan and demonstrated an understanding of the instructions.   The patient was advised to call back or seek an in-person evaluation if the symptoms worsen or if the condition fails to improve as anticipated.  I provided 20 minutes of non-face-to-face time during this encounter.   Penni Homans, MD

## 2020-01-26 NOTE — Assessment & Plan Note (Signed)
Supplement and monitor 

## 2020-01-31 ENCOUNTER — Ambulatory Visit: Payer: PRIVATE HEALTH INSURANCE | Admitting: Family Medicine

## 2020-02-03 ENCOUNTER — Other Ambulatory Visit: Payer: Self-pay | Admitting: Family Medicine

## 2020-02-05 ENCOUNTER — Other Ambulatory Visit: Payer: Self-pay | Admitting: Family Medicine

## 2020-02-05 MED ORDER — FENTANYL 25 MCG/HR TD PT72: 1.0000 | MEDICATED_PATCH | TRANSDERMAL | 0 refills | Status: DC

## 2020-02-09 ENCOUNTER — Encounter: Payer: Self-pay | Admitting: Family Medicine

## 2020-02-09 ENCOUNTER — Telehealth: Payer: Self-pay | Admitting: Orthopaedic Surgery

## 2020-02-09 ENCOUNTER — Ambulatory Visit: Payer: BC Managed Care – PPO | Admitting: Family Medicine

## 2020-02-09 NOTE — Telephone Encounter (Signed)
I will need an MRI of the wrist first for surgical planning.  Thanks.

## 2020-02-09 NOTE — Telephone Encounter (Signed)
Patient called stating that the Ganglion Cyst on her wrist is not getting any better, it's in fact becoming quiet painful.  She would like to know what the next step is and if so, how far out Dr. Erlinda Hong is on his surgery schedule.  She also will be taking care of her mother when she comes home after breaking her hip.  CB#[phone].  Thank you.

## 2020-02-10 ENCOUNTER — Other Ambulatory Visit: Payer: Self-pay

## 2020-02-10 DIAGNOSIS — M67431 Ganglion, right wrist: Secondary | ICD-10-CM

## 2020-02-10 NOTE — Telephone Encounter (Signed)
Patient aware.

## 2020-02-10 NOTE — Telephone Encounter (Signed)
Order made

## 2020-02-17 ENCOUNTER — Telehealth (INDEPENDENT_AMBULATORY_CARE_PROVIDER_SITE_OTHER): Payer: PRIVATE HEALTH INSURANCE | Admitting: Family Medicine

## 2020-02-17 ENCOUNTER — Other Ambulatory Visit: Payer: Self-pay

## 2020-02-17 ENCOUNTER — Encounter: Payer: Self-pay | Admitting: Family Medicine

## 2020-02-17 VITALS — HR 66 | Wt 230.0 lb

## 2020-02-17 DIAGNOSIS — E559 Vitamin D deficiency, unspecified: Secondary | ICD-10-CM

## 2020-02-17 DIAGNOSIS — M545 Low back pain, unspecified: Secondary | ICD-10-CM

## 2020-02-17 DIAGNOSIS — M1A9XX0 Chronic gout, unspecified, without tophus (tophi): Secondary | ICD-10-CM

## 2020-02-17 DIAGNOSIS — E785 Hyperlipidemia, unspecified: Secondary | ICD-10-CM | POA: Diagnosis not present

## 2020-02-17 DIAGNOSIS — E039 Hypothyroidism, unspecified: Secondary | ICD-10-CM | POA: Diagnosis not present

## 2020-02-17 DIAGNOSIS — R739 Hyperglycemia, unspecified: Secondary | ICD-10-CM

## 2020-02-17 DIAGNOSIS — Z79899 Other long term (current) drug therapy: Secondary | ICD-10-CM

## 2020-02-17 DIAGNOSIS — M79604 Pain in right leg: Secondary | ICD-10-CM

## 2020-02-17 DIAGNOSIS — R002 Palpitations: Secondary | ICD-10-CM

## 2020-02-17 DIAGNOSIS — E213 Hyperparathyroidism, unspecified: Secondary | ICD-10-CM

## 2020-02-17 DIAGNOSIS — K1370 Unspecified lesions of oral mucosa: Secondary | ICD-10-CM

## 2020-02-17 DIAGNOSIS — I10 Essential (primary) hypertension: Secondary | ICD-10-CM

## 2020-02-17 NOTE — Assessment & Plan Note (Addendum)
She continues to struggle with a lesion in her mouth, it has been present in her mouth for about 10 years but it has recently enlarged slightly. Will f/u on referral to the [facility] on 4/1 they said they would be contacting her on follow up they noted her number was disconnected. She admits that was the case and she will talk with them now.

## 2020-02-17 NOTE — Assessment & Plan Note (Signed)
Supple and monitor

## 2020-02-19 ENCOUNTER — Other Ambulatory Visit: Payer: Self-pay | Admitting: Family Medicine

## 2020-02-19 NOTE — Assessment & Plan Note (Signed)
Encouraged heart healthy diet, increase exercise, avoid trans fats, consider a krill oil cap daily 

## 2020-02-19 NOTE — Assessment & Plan Note (Signed)
Check labs, minimize calcium in diet and monitor labs.

## 2020-02-19 NOTE — Progress Notes (Signed)
Virtual Visit via Video Note  I connected with [name] [name] on 02/17/20 at  9:40 AM EDT by a video enabled telemedicine application and verified that I am speaking with the correct person using two identifiers.  Location: Patient: home, patient is in visit Provider: home, physician is in visit   I discussed the limitations of evaluation and management by telemedicine and the availability of in person appointments. The patient expressed understanding and agreed to proceed. [name], CMA was able to get the patient setup on a video visit     Subjective:    Patient ID: [name] [name], female    DOB: May 28, 1961, 59 y.o.   MRN: [id number]  Chief Complaint  Patient presents with  . Medication Management    6 month follow up    HPI Patient is in today for follow up on chronic medical concerns. The change in her pain meds has been helpful but she is reminded that she will need to try and decrease the meds when able. If she needs further meds she will need to go to pain management. She has not talked to the oral surgeon yet. Denies CP/palp/SOB/HA/congestion/fevers/GI or GU c/o. Taking meds as prescribed  Past Medical History:  Diagnosis Date  . Acute foot pain, right 01/13/2017  . Acute upper respiratory infections of unspecified site 06/12/2014  . Anxiety and depression    chronic  . Breast cancer screening 09/05/2016  . Cervical cancer screening 09/05/2016  . Chronic back pain   . Depression with anxiety 08/23/2008   Qualifier: Diagnosis of  By: Niel Hummer MD, Lorinda Creed   . Gout 05/05/2017  . History of colon polyps   . History of fibromyalgia    suspected   . Hypertension   . Insomnia   . Knee pain, right 06/24/2015  . Meningitis spinal   . Morbid obesity ([place])   . Ovarian cyst, bilateral   . Preventative health care 06/24/2015  . PTSD (post-traumatic stress disorder)   . Vitamin D deficiency 06/24/2015    Past Surgical History:  Procedure Laterality Date  . COLONOSCOPY     . OVARIAN CYST REMOVAL      Family History  Problem Relation Age of Onset  . Rheum arthritis Father   . Diabetes Father   . Congestive Heart Failure Father   . Hypertension Father   . Stroke Father   . Cancer Maternal Uncle 70       colon  . Hip fracture Maternal Grandmother   . COPD Maternal Grandfather 87       colon  . Colon cancer Maternal Grandfather   . Lymphoma Paternal Grandmother   . Colon cancer Mother   . Breast cancer Mother        benign  . Colon cancer Maternal Aunt   . Breast cancer Maternal Great-grandmother   . Breast cancer Cousin        negative stage 3  . ADD / ADHD Neg Hx   . Depression Neg Hx   . Alcohol abuse Neg Hx   . Drug abuse Neg Hx   . Esophageal cancer Neg Hx     Social History   Socioeconomic History  . Marital status: Divorced    Spouse name: Not on file  . Number of children: Not on file  . Years of education: Not on file  . Highest education level: Not on file  Occupational History  . Not on file  Tobacco Use  . Smoking status:  Former Smoker    Quit date: 04/04/1989    Years since quitting: 30.8  . Smokeless tobacco: Never Used  Substance and Sexual Activity  . Alcohol use: Yes    Comment: occassionally- 2 glassses of wine 2x/month  . Drug use: No    Comment: take meds as prescribed  . Sexual activity: Not on file  Other Topics Concern  . Not on file  Social History Narrative  . Not on file   Social Determinants of Health   Financial Resource Strain:   . Difficulty of Paying Living Expenses:   Food Insecurity:   . Worried About Charity fundraiser in the Last Year:   . Arboriculturist in the Last Year:   Transportation Needs:   . Film/video editor (Medical):   Marland Kitchen Lack of Transportation (Non-Medical):   Physical Activity:   . Days of Exercise per Week:   . Minutes of Exercise per Session:   Stress:   . Feeling of Stress :   Social Connections:   . Frequency of Communication with Friends and Family:   .  Frequency of Social Gatherings with Friends and Family:   . Attends Religious Services:   . Active Member of Clubs or Organizations:   . Attends Archivist Meetings:   Marland Kitchen Marital Status:   Intimate Partner Violence:   . Fear of Current or Ex-Partner:   . Emotionally Abused:   Marland Kitchen Physically Abused:   . Sexually Abused:     Outpatient Medications Prior to Visit  Medication Sig Dispense Refill  . atorvastatin (LIPITOR) 10 MG tablet Take 1 tablet (10 mg total) by mouth daily. 90 tablet 1  . beclomethasone (BECONASE-AQ) 42 MCG/SPRAY nasal spray Place 1 spray into both nostrils 2 (two) times daily. Dose is for each nostril. 25 g 1  . fentaNYL (DURAGESIC) 25 MCG/HR Place 1 patch onto the skin every 3 (three) days. 10 patch 0  . FLUoxetine (PROZAC) 10 MG capsule Take 1 capsule (10 mg total) by mouth daily. 90 capsule 3  . lisinopril (ZESTRIL) 10 MG tablet TAKE 1 TABLET BY MOUTH EVERY DAY 90 tablet 1  . metoprolol succinate (TOPROL-XL) 25 MG 24 hr tablet TAKE 1 TABLET BY MOUTH EVERY DAY 90 tablet 1  . oxyCODONE (ROXICODONE) 15 MG immediate release tablet Take 1 tablet (15 mg total) by mouth every 6 (six) hours as needed. for pain 120 tablet 0  . Vitamin D, Ergocalciferol, (DRISDOL) 1.25 MG (50000 UNIT) CAPS capsule TAKE ONE CAPSULE BY MOUTH EVERY WEEK 12 capsule 0  . ondansetron (ZOFRAN) 4 MG tablet Take 1 tablet (4 mg total) by mouth every 8 (eight) hours as needed for nausea or vomiting. 60 tablet 2   No facility-administered medications prior to visit.    Allergies  Allergen Reactions  . Cymbalta [Duloxetine Hcl] Other (See Comments)    Headaches  . Fiorinal [Butalbital-Aspirin-Caffeine] Other (See Comments)    Mental Clarity.    Review of Systems  Constitutional: Positive for malaise/fatigue. Negative for fever.  HENT: Negative for congestion.   Eyes: Negative for blurred vision.  Respiratory: Negative for shortness of breath.   Cardiovascular: Negative for chest pain,  palpitations and leg swelling.  Gastrointestinal: Negative for abdominal pain, blood in stool and nausea.  Genitourinary: Negative for dysuria and frequency.  Musculoskeletal: Positive for back pain, joint pain and myalgias. Negative for falls.  Skin: Negative for rash.  Neurological: Negative for dizziness, loss of consciousness and headaches.  Endo/Heme/Allergies:  Negative for environmental allergies.  Psychiatric/Behavioral: Negative for depression. The patient is not nervous/anxious.        Objective:    Physical Exam Constitutional:      Appearance: Normal appearance. She is not ill-appearing.  HENT:     Head: Normocephalic and atraumatic.     Right Ear: External ear normal.     Left Ear: External ear normal.     Nose: Nose normal.  Eyes:     General:        Right eye: No discharge.        Left eye: No discharge.  Pulmonary:     Effort: Pulmonary effort is normal.  Neurological:     Mental Status: She is alert and oriented to person, place, and time.  Psychiatric:        Behavior: Behavior normal.     Pulse 66   Wt 230 lb (104.3 kg)   BMI 38.27 kg/m  Wt Readings from Last 3 Encounters:  02/17/20 230 lb (104.3 kg)  12/26/19 239 lb 3.2 oz (108.5 kg)  12/09/19 240 lb (108.9 kg)    Diabetic Foot Exam - Simple   No data filed     Lab Results  Component Value Date   WBC 6.8 08/11/2019   HGB 13.7 08/11/2019   HCT 40.7 08/11/2019   PLT 222.0 08/11/2019   GLUCOSE 103 (H) 08/11/2019   CHOL 232 (H) 08/11/2019   TRIG (H) 08/11/2019    400.0 Triglyceride is over 400; calculations on Lipids are invalid.   HDL 63.20 08/11/2019   LDLDIRECT 108.0 08/11/2019   LDLCALC 105 (H) 07/06/2017   ALT 20 08/11/2019   AST 23 08/11/2019   NA 141 08/11/2019   K 4.2 08/11/2019   CL 102 08/11/2019   CREATININE 0.73 08/11/2019   BUN 21 08/11/2019   CO2 28 08/11/2019   TSH 1.82 08/11/2019   HGBA1C 5.3 08/11/2019    Lab Results  Component Value Date   TSH 1.82 08/11/2019    Lab Results  Component Value Date   WBC 6.8 08/11/2019   HGB 13.7 08/11/2019   HCT 40.7 08/11/2019   MCV 100.8 (H) 08/11/2019   PLT 222.0 08/11/2019   Lab Results  Component Value Date   NA 141 08/11/2019   K 4.2 08/11/2019   CO2 28 08/11/2019   GLUCOSE 103 (H) 08/11/2019   BUN 21 08/11/2019   CREATININE 0.73 08/11/2019   BILITOT 0.4 08/11/2019   ALKPHOS 73 08/11/2019   AST 23 08/11/2019   ALT 20 08/11/2019   PROT 7.3 08/11/2019   ALBUMIN 4.5 08/11/2019   CALCIUM 9.9 08/30/2019   GFR 81.94 08/11/2019   Lab Results  Component Value Date   CHOL 232 (H) 08/11/2019   Lab Results  Component Value Date   HDL 63.20 08/11/2019   Lab Results  Component Value Date   LDLCALC 105 (H) 07/06/2017   Lab Results  Component Value Date   TRIG (H) 08/11/2019    400.0 Triglyceride is over 400; calculations on Lipids are invalid.   Lab Results  Component Value Date   CHOLHDL 4 08/11/2019   Lab Results  Component Value Date   HGBA1C 5.3 08/11/2019       Assessment & Plan:   Problem List Items Addressed This Visit    Hypothyroidism   Relevant Orders   TSH   Hyperlipidemia - Primary    Encouraged heart healthy diet, increase exercise, avoid trans fats, consider a krill oil cap  daily      Relevant Orders   Lipid panel   Essential hypertension    Monitor and report any concerning numbers, no changes to meds. Encouraged heart healthy diet such as the DASH diet and exercise as tolerated.       Relevant Orders   CBC   Comprehensive metabolic panel   Low back pain radiating to right lower extremity    Is doing well on current dosing of meds. No changes today      PALPITATIONS, OCCASIONAL   Hypercalcemia    Check labs, minimize calcium in diet and monitor labs.       Relevant Orders   Comprehensive metabolic panel   PTH, Intact and Calcium   Vitamin D deficiency    Supple and monitor      Relevant Orders   VITAMIN D 25 Hydroxy (Vit-D Deficiency, Fractures)    Gout    No recent flare, hydrate well and monitor uric acid level      Relevant Orders   Uric acid   Hyperparathyroidism (HCC)   Hyperglycemia    hgba1c acceptable, minimize simple carbs. Increase exercise as tolerated.      Relevant Orders   Hemoglobin A1c   Oral mucosal lesion    She continues to struggle with a lesion in her mouth, it has been present in her mouth for about 10 years but it has recently enlarged slightly. Will f/u on referral to the [facility] on 4/1 they said they would be contacting her on follow up they noted her number was disconnected. She admits that was the case and she will talk with them now.         I have discontinued Tristen C. Reinwald "Candace"'s ondansetron. I am also having her maintain her beclomethasone, FLUoxetine, metoprolol succinate, lisinopril, atorvastatin, Vitamin D (Ergocalciferol), oxyCODONE, and fentaNYL.  No orders of the defined types were placed in this encounter.    I discussed the assessment and treatment plan with the patient. The patient was provided an opportunity to ask questions and all were answered. The patient agreed with the plan and demonstrated an understanding of the instructions.   The patient was advised to call back or seek an in-person evaluation if the symptoms worsen or if the condition fails to improve as anticipated.  I provided 20 minutes of non-face-to-face time during this encounter.   Penni Homans, MD

## 2020-02-19 NOTE — Assessment & Plan Note (Signed)
Monitor and report any concerning numbers, no changes to meds. Encouraged heart healthy diet such as the DASH diet and exercise as tolerated.  

## 2020-02-19 NOTE — Assessment & Plan Note (Signed)
No recent flare, hydrate well and monitor uric acid level

## 2020-02-19 NOTE — Assessment & Plan Note (Signed)
hgba1c acceptable, minimize simple carbs. Increase exercise as tolerated.  

## 2020-02-19 NOTE — Assessment & Plan Note (Signed)
Is doing well on current dosing of meds. No changes today

## 2020-02-20 ENCOUNTER — Other Ambulatory Visit: Payer: Self-pay | Admitting: Family Medicine

## 2020-02-20 MED ORDER — OXYCODONE HCL 15 MG PO TABS: 15.0000 mg | ORAL_TABLET | Freq: Four times a day (QID) | ORAL | 0 refills | Status: DC | PRN

## 2020-02-21 ENCOUNTER — Other Ambulatory Visit: Payer: Self-pay

## 2020-03-13 ENCOUNTER — Other Ambulatory Visit: Payer: PRIVATE HEALTH INSURANCE

## 2020-03-15 ENCOUNTER — Other Ambulatory Visit: Payer: Self-pay | Admitting: Family Medicine

## 2020-03-16 ENCOUNTER — Other Ambulatory Visit: Payer: Self-pay | Admitting: Family Medicine

## 2020-03-16 NOTE — Telephone Encounter (Signed)
Duplicate.  Can you please delete when other is approved.

## 2020-03-16 NOTE — Telephone Encounter (Signed)
Last written: fentanyl 02/05/20 oxycodone 02/20/20   Last ov: 02/17/20 Next ov: none Contract: UDS:

## 2020-03-17 ENCOUNTER — Other Ambulatory Visit: Payer: Self-pay | Admitting: Family Medicine

## 2020-03-19 MED ORDER — OXYCODONE HCL 15 MG PO TABS: 15.0000 mg | ORAL_TABLET | Freq: Four times a day (QID) | ORAL | 0 refills | Status: DC | PRN

## 2020-03-19 NOTE — Telephone Encounter (Signed)
Last vitamin d was done on 08/12/19 and was 35.76.  Do you want to continue?

## 2020-03-21 ENCOUNTER — Telehealth: Payer: Self-pay | Admitting: Family Medicine

## 2020-03-21 DIAGNOSIS — K1370 Unspecified lesions of oral mucosa: Secondary | ICD-10-CM

## 2020-03-21 NOTE — Telephone Encounter (Signed)
Caller: Keltie Call back phone number: [phone]   Patient has found an oral sugeon in network. A referral have to be send via e-mail to [email or web address]. Doctor Rebekah Chesterfield  phone number [phone].

## 2020-03-22 NOTE — Telephone Encounter (Signed)
Left message on machine that we will send referral

## 2020-03-23 NOTE — Addendum Note (Signed)
Addended by: Kelle Darting A on: 03/23/2020 12:16 PM   Modules accepted: Orders

## 2020-03-27 ENCOUNTER — Other Ambulatory Visit: Payer: PRIVATE HEALTH INSURANCE

## 2020-03-30 ENCOUNTER — Other Ambulatory Visit: Payer: Self-pay | Admitting: Family Medicine

## 2020-04-05 ENCOUNTER — Other Ambulatory Visit (INDEPENDENT_AMBULATORY_CARE_PROVIDER_SITE_OTHER): Payer: PRIVATE HEALTH INSURANCE

## 2020-04-05 ENCOUNTER — Other Ambulatory Visit (HOSPITAL_BASED_OUTPATIENT_CLINIC_OR_DEPARTMENT_OTHER): Payer: Self-pay | Admitting: Family Medicine

## 2020-04-05 ENCOUNTER — Other Ambulatory Visit: Payer: Self-pay

## 2020-04-05 ENCOUNTER — Ambulatory Visit (HOSPITAL_BASED_OUTPATIENT_CLINIC_OR_DEPARTMENT_OTHER)
Admission: RE | Admit: 2020-04-05 | Discharge: 2020-04-05 | Disposition: A | Discharge status: Home / Self Care | Payer: PRIVATE HEALTH INSURANCE | Source: Ambulatory Visit | Attending: Family Medicine | Admitting: Family Medicine

## 2020-04-05 DIAGNOSIS — E039 Hypothyroidism, unspecified: Secondary | ICD-10-CM | POA: Diagnosis not present

## 2020-04-05 DIAGNOSIS — E559 Vitamin D deficiency, unspecified: Secondary | ICD-10-CM | POA: Diagnosis not present

## 2020-04-05 DIAGNOSIS — Z1231 Encounter for screening mammogram for malignant neoplasm of breast: Secondary | ICD-10-CM | POA: Insufficient documentation

## 2020-04-05 DIAGNOSIS — Z79899 Other long term (current) drug therapy: Secondary | ICD-10-CM | POA: Diagnosis not present

## 2020-04-05 DIAGNOSIS — R739 Hyperglycemia, unspecified: Secondary | ICD-10-CM

## 2020-04-05 DIAGNOSIS — M1A9XX0 Chronic gout, unspecified, without tophus (tophi): Secondary | ICD-10-CM | POA: Diagnosis not present

## 2020-04-05 DIAGNOSIS — E785 Hyperlipidemia, unspecified: Secondary | ICD-10-CM

## 2020-04-05 DIAGNOSIS — I10 Essential (primary) hypertension: Secondary | ICD-10-CM

## 2020-04-06 ENCOUNTER — Telehealth: Payer: Self-pay | Admitting: *Deleted

## 2020-04-06 LAB — LIPID PANEL
Cholesterol: 172 mg/dL (ref 0–200)
HDL: 78.9 mg/dL (ref >39.00)
LDL Cholesterol: 67 mg/dL (ref 0–99)
NonHDL: 93.41
Total CHOL/HDL Ratio: 2
Triglycerides: 130 mg/dL (ref 0.0–149.0)
VLDL: 26 mg/dL (ref 0.0–40.0)

## 2020-04-06 LAB — CBC
HCT: 38.6 % (ref 36.0–46.0)
Hemoglobin: 12.9 g/dL (ref 12.0–15.0)
MCHC: 33.4 g/dL (ref 30.0–36.0)
MCV: 103.5 fl — ABNORMAL HIGH (ref 78.0–100.0)
Platelets: 192 10*3/uL (ref 150.0–400.0)
RBC: 3.73 Mil/uL — ABNORMAL LOW (ref 3.87–5.11)
RDW: 14.8 % (ref 11.5–15.5)
WBC: 7.3 10*3/uL (ref 4.0–10.5)

## 2020-04-06 LAB — COMPREHENSIVE METABOLIC PANEL
ALT: 22 U/L (ref 0–35)
AST: 30 U/L (ref 0–37)
Albumin: 4 g/dL (ref 3.5–5.2)
Alkaline Phosphatase: 72 U/L (ref 39–117)
BUN: 10 mg/dL (ref 6–23)
CO2: 31 mEq/L (ref 19–32)
Calcium: 10.4 mg/dL (ref 8.4–10.5)
Chloride: 103 mEq/L (ref 96–112)
Creatinine, Ser: 0.55 mg/dL (ref 0.40–1.20)
GFR: 113.35 mL/min (ref >60.00)
Glucose, Bld: 112 mg/dL — ABNORMAL HIGH (ref 70–99)
Potassium: 4.1 mEq/L (ref 3.5–5.1)
Sodium: 143 mEq/L (ref 135–145)
Total Bilirubin: 0.7 mg/dL (ref 0.2–1.2)
Total Protein: 6.1 g/dL (ref 6.0–8.3)

## 2020-04-06 LAB — PTH, INTACT AND CALCIUM
Calcium: 10.6 mg/dL — ABNORMAL HIGH (ref 8.6–10.4)
PTH: 69 pg/mL — ABNORMAL HIGH (ref 14–64)

## 2020-04-06 LAB — VITAMIN D 25 HYDROXY (VIT D DEFICIENCY, FRACTURES): VITD: 41.61 ng/mL (ref 30.00–100.00)

## 2020-04-06 LAB — URIC ACID: Uric Acid, Serum: 7.9 mg/dL — ABNORMAL HIGH (ref 2.4–7.0)

## 2020-04-06 LAB — TSH: TSH: 2.82 u[IU]/mL (ref 0.35–4.50)

## 2020-04-06 LAB — HEMOGLOBIN A1C: Hgb A1c MFr Bld: 5.5 % (ref 4.6–6.5)

## 2020-04-06 NOTE — Telephone Encounter (Signed)
Patient wanted to ask about a couple of things.    1.  She was told that should not be on vitamin d 50000 IU for more than 12 weeks.  She has been on this for years.  Her last vitamin d was checked yesterday and was 41.61.  Should she still be taking?  2.  She also would like to should she be seeing endo about her parathyroid?

## 2020-04-06 NOTE — Telephone Encounter (Signed)
She can stop the 50000 iu and make sure she is taking a daily supplement. What ever she is taking increase dose b 1000 IU daily. She is welcome to have a consultation with endocrinology but we often just monitor numbers like this. If she is interested we can refer to Dr Kelton Pillar at [facility] for consultation.

## 2020-04-07 LAB — DRUG MONITORING, PANEL 8 WITH CONFIRMATION, URINE
6 Acetylmorphine: NEGATIVE ng/mL (ref <10)
Alcohol Metabolites: POSITIVE ng/mL — AB
Amphetamines: NEGATIVE ng/mL (ref <500)
Benzodiazepines: NEGATIVE ng/mL (ref <100)
Buprenorphine, Urine: NEGATIVE ng/mL (ref <5)
Cocaine Metabolite: NEGATIVE ng/mL (ref <150)
Codeine: NEGATIVE ng/mL (ref <50)
Creatinine: 8.2 mg/dL
Ethyl Glucuronide (ETG): 27894 ng/mL — ABNORMAL HIGH (ref <500)
Ethyl Sulfate (ETS): 4007 ng/mL — ABNORMAL HIGH (ref <100)
Hydrocodone: NEGATIVE ng/mL (ref <50)
Hydromorphone: NEGATIVE ng/mL (ref <50)
MDMA: NEGATIVE ng/mL (ref <500)
Marijuana Metabolite: NEGATIVE ng/mL (ref <20)
Morphine: NEGATIVE ng/mL (ref <50)
Norhydrocodone: NEGATIVE ng/mL (ref <50)
Noroxycodone: 811 ng/mL — ABNORMAL HIGH (ref <50)
Opiates: NEGATIVE ng/mL (ref <100)
Oxidant: NEGATIVE ug/mL
Oxycodone: 506 ng/mL — ABNORMAL HIGH (ref <50)
Oxycodone: POSITIVE ng/mL — AB (ref <100)
Oxymorphone: 73 ng/mL — ABNORMAL HIGH (ref <50)
pH: 7.3 (ref 4.5–9.0)

## 2020-04-07 LAB — DM TEMPLATE

## 2020-04-09 NOTE — Telephone Encounter (Signed)
Patient notified of note below.  She stated that she will decline endocrinology referral this time and just wait, she has a lot going on.  She will let us know when she is ready.

## 2020-04-16 ENCOUNTER — Other Ambulatory Visit: Payer: Self-pay | Admitting: Family Medicine

## 2020-04-16 MED ORDER — FENTANYL 25 MCG/HR TD PT72: 1.0000 | MEDICATED_PATCH | TRANSDERMAL | 0 refills | Status: DC

## 2020-04-16 MED ORDER — OXYCODONE HCL 15 MG PO TABS: 15.0000 mg | ORAL_TABLET | Freq: Four times a day (QID) | ORAL | 0 refills | Status: DC | PRN

## 2020-05-10 ENCOUNTER — Other Ambulatory Visit: Payer: Self-pay | Admitting: Family Medicine

## 2020-05-14 ENCOUNTER — Other Ambulatory Visit: Payer: Self-pay | Admitting: Family Medicine

## 2020-05-14 MED ORDER — FENTANYL 25 MCG/HR TD PT72: 1.0000 | MEDICATED_PATCH | TRANSDERMAL | 0 refills | Status: DC

## 2020-05-14 MED ORDER — OXYCODONE HCL 15 MG PO TABS: 15.0000 mg | ORAL_TABLET | Freq: Four times a day (QID) | ORAL | 0 refills | Status: DC | PRN

## 2020-05-14 NOTE — Telephone Encounter (Signed)
Requesting: oxycodone and fentanyl  Contract:07/2019 UDS:04/05/20 Last Visit:02/17/20 Next Visit:06/25/20 Last Refill: BOTH 04/16/20  Please Advise

## 2020-05-28 ENCOUNTER — Telehealth: Payer: Self-pay | Admitting: Family Medicine

## 2020-05-28 NOTE — Telephone Encounter (Signed)
Recv'd records from [facility] forwarded 9 pages to Dr. Penni Homans 8/30/21fbg

## 2020-06-09 ENCOUNTER — Other Ambulatory Visit: Payer: Self-pay | Admitting: Family Medicine

## 2020-06-10 ENCOUNTER — Other Ambulatory Visit: Payer: Self-pay | Admitting: Family Medicine

## 2020-06-11 MED ORDER — OXYCODONE HCL 15 MG PO TABS: 15.0000 mg | ORAL_TABLET | Freq: Four times a day (QID) | ORAL | 0 refills | Status: DC | PRN

## 2020-06-11 NOTE — Telephone Encounter (Signed)
Last written:  05/14/20 Last ov: 02/17/20 Next ov: 06/25/20 Contract: 08/11/19 UDS: 04/05/20

## 2020-06-11 NOTE — Telephone Encounter (Signed)
Last vit D checked on 04/05/20 and was 41.61.  do you want her to continue?

## 2020-06-25 ENCOUNTER — Other Ambulatory Visit: Payer: Self-pay | Admitting: Family Medicine

## 2020-06-25 ENCOUNTER — Other Ambulatory Visit: Payer: Self-pay

## 2020-06-25 ENCOUNTER — Ambulatory Visit (INDEPENDENT_AMBULATORY_CARE_PROVIDER_SITE_OTHER): Payer: PRIVATE HEALTH INSURANCE | Admitting: Family Medicine

## 2020-06-25 VITALS — BP 127/71 | HR 78 | Temp 98.5°F | Resp 13 | Ht 65.0 in | Wt 232.8 lb

## 2020-06-25 DIAGNOSIS — E039 Hypothyroidism, unspecified: Secondary | ICD-10-CM

## 2020-06-25 DIAGNOSIS — R739 Hyperglycemia, unspecified: Secondary | ICD-10-CM

## 2020-06-25 DIAGNOSIS — I10 Essential (primary) hypertension: Secondary | ICD-10-CM | POA: Diagnosis not present

## 2020-06-25 DIAGNOSIS — M545 Low back pain, unspecified: Secondary | ICD-10-CM

## 2020-06-25 DIAGNOSIS — E559 Vitamin D deficiency, unspecified: Secondary | ICD-10-CM

## 2020-06-25 DIAGNOSIS — E785 Hyperlipidemia, unspecified: Secondary | ICD-10-CM

## 2020-06-25 DIAGNOSIS — N950 Postmenopausal bleeding: Secondary | ICD-10-CM

## 2020-06-25 DIAGNOSIS — M79604 Pain in right leg: Secondary | ICD-10-CM

## 2020-06-25 NOTE — Assessment & Plan Note (Signed)
hgba1c acceptable, minimize simple carbs. Increase exercise as tolerated.  

## 2020-06-25 NOTE — Assessment & Plan Note (Signed)
On Levothyroxine, continue to monitor 

## 2020-06-25 NOTE — Assessment & Plan Note (Signed)
Well controlled, no changes to meds. Encouraged heart healthy diet such as the DASH diet and exercise as tolerated.  °

## 2020-06-25 NOTE — Progress Notes (Signed)
Subjective:    Patient ID: [name] [name], female    DOB: 04/08/1961, 59 y.o.   MRN: [id number]  Chief Complaint  Patient presents with  . Follow-up    HPI Patient is in today for follow up on chronic medical concerns. No recent febrile illness or hospitalizations. No polyuria or polydipsia c/o. She is trying to eat well and stay active. No new or acute concerns. Denies CP/palp/SOB/HA/congestion/fevers/GI or GU c/o. Taking meds as prescribed  Past Medical History:  Diagnosis Date  . Acute foot pain, right 01/13/2017  . Acute upper respiratory infections of unspecified site 06/12/2014  . Anxiety and depression    chronic  . Breast cancer screening 09/05/2016  . Cervical cancer screening 09/05/2016  . Chronic back pain   . Depression with anxiety 08/23/2008   Qualifier: Diagnosis of  By: Niel Hummer MD, Lorinda Creed   . Gout 05/05/2017  . History of colon polyps   . History of fibromyalgia    suspected   . Hypertension   . Insomnia   . Knee pain, right 06/24/2015  . Meningitis spinal   . Morbid obesity ([place])   . Ovarian cyst, bilateral   . Preventative health care 06/24/2015  . PTSD (post-traumatic stress disorder)   . Vitamin D deficiency 06/24/2015    Past Surgical History:  Procedure Laterality Date  . COLONOSCOPY    . OVARIAN CYST REMOVAL      Family History  Problem Relation Age of Onset  . Rheum arthritis Father   . Diabetes Father   . Congestive Heart Failure Father   . Hypertension Father   . Stroke Father   . Cancer Maternal Uncle 22       colon  . Hip fracture Maternal Grandmother   . COPD Maternal Grandfather 87       colon  . Colon cancer Maternal Grandfather   . Lymphoma Paternal Grandmother   . Colon cancer Mother   . Breast cancer Mother        benign  . Colon cancer Maternal Aunt   . Breast cancer Maternal Great-grandmother   . Breast cancer Cousin        negative stage 3  . ADD / ADHD Neg Hx   . Depression Neg Hx   . Alcohol abuse Neg Hx   . Drug  abuse Neg Hx   . Esophageal cancer Neg Hx     Social History   Socioeconomic History  . Marital status: Divorced    Spouse name: Not on file  . Number of children: Not on file  . Years of education: Not on file  . Highest education level: Not on file  Occupational History  . Not on file  Tobacco Use  . Smoking status: Former Smoker    Quit date: 04/04/1989    Years since quitting: 31.2  . Smokeless tobacco: Never Used  Vaping Use  . Vaping Use: Never used  Substance and Sexual Activity  . Alcohol use: Yes    Comment: occassionally- 2 glassses of wine 2x/month  . Drug use: No    Comment: take meds as prescribed  . Sexual activity: Not on file  Other Topics Concern  . Not on file  Social History Narrative  . Not on file   Social Determinants of Health   Financial Resource Strain:   . Difficulty of Paying Living Expenses: Not on file  Food Insecurity:   . Worried About Charity fundraiser in the Last Year:  Not on file  . Ran Out of Food in the Last Year: Not on file  Transportation Needs:   . Lack of Transportation (Medical): Not on file  . Lack of Transportation (Non-Medical): Not on file  Physical Activity:   . Days of Exercise per Week: Not on file  . Minutes of Exercise per Session: Not on file  Stress:   . Feeling of Stress : Not on file  Social Connections:   . Frequency of Communication with Friends and Family: Not on file  . Frequency of Social Gatherings with Friends and Family: Not on file  . Attends Religious Services: Not on file  . Active Member of Clubs or Organizations: Not on file  . Attends Archivist Meetings: Not on file  . Marital Status: Not on file  Intimate Partner Violence:   . Fear of Current or Ex-Partner: Not on file  . Emotionally Abused: Not on file  . Physically Abused: Not on file  . Sexually Abused: Not on file    Outpatient Medications Prior to Visit  Medication Sig Dispense Refill  . atorvastatin (LIPITOR) 10 MG  tablet TAKE ONE TABLET BY MOUTH ONE TIME DAILY 90 tablet 1  . beclomethasone (BECONASE-AQ) 42 MCG/SPRAY nasal spray Place 1 spray into both nostrils 2 (two) times daily. Dose is for each nostril. 25 g 1  . fentaNYL (DURAGESIC) 25 MCG/HR Place 1 patch onto the skin every 3 (three) days. 10 patch 0  . FLUoxetine (PROZAC) 10 MG capsule Take 1 capsule (10 mg total) by mouth daily. 90 capsule 3  . lisinopril (ZESTRIL) 10 MG tablet TAKE ONE TABLET BY MOUTH ONE TIME DAILY 90 tablet 1  . metoprolol succinate (TOPROL-XL) 25 MG 24 hr tablet TAKE ONE TABLET BY MOUTH ONE TIME DAILY 90 tablet 1  . oxyCODONE (ROXICODONE) 15 MG immediate release tablet Take 1 tablet (15 mg total) by mouth every 6 (six) hours as needed. for pain 120 tablet 0  . Vitamin D, Ergocalciferol, (DRISDOL) 1.25 MG (50000 UNIT) CAPS capsule TAKE ONE CAPSULE BY MOUTH EVERY WEEK 12 capsule 0   No facility-administered medications prior to visit.    Allergies  Allergen Reactions  . Cymbalta [Duloxetine Hcl] Other (See Comments)    Headaches  . Fiorinal [Butalbital-Aspirin-Caffeine] Other (See Comments)    Mental Clarity.    Review of Systems  Constitutional: Negative for fever and malaise/fatigue.  HENT: Negative for congestion.   Eyes: Negative for blurred vision.  Respiratory: Negative for shortness of breath.   Cardiovascular: Negative for chest pain, palpitations and leg swelling.  Gastrointestinal: Negative for abdominal pain, blood in stool and nausea.  Genitourinary: Negative for dysuria and frequency.  Musculoskeletal: Positive for back pain. Negative for falls.  Skin: Negative for rash.  Neurological: Negative for dizziness, loss of consciousness and headaches.  Endo/Heme/Allergies: Negative for environmental allergies.  Psychiatric/Behavioral: Negative for depression. The patient is not nervous/anxious.        Objective:    Physical Exam Vitals and nursing note reviewed.  Constitutional:      General: She is  not in acute distress.    Appearance: She is well-developed.  HENT:     Head: Normocephalic and atraumatic.     Nose: Nose normal.  Eyes:     General:        Right eye: No discharge.        Left eye: No discharge.  Cardiovascular:     Rate and Rhythm: Normal rate and regular rhythm.  Heart sounds: No murmur heard.   Pulmonary:     Effort: Pulmonary effort is normal.     Breath sounds: Normal breath sounds.  Abdominal:     General: Bowel sounds are normal.     Palpations: Abdomen is soft.     Tenderness: There is no abdominal tenderness.  Musculoskeletal:     Cervical back: Normal range of motion and neck supple.  Skin:    General: Skin is warm and dry.  Neurological:     Mental Status: She is alert and oriented to person, place, and time.     BP 127/71 (BP Location: Left Arm, Patient Position: Sitting, Cuff Size: Large)   Pulse 78   Temp 98.5 F (36.9 C) (Oral)   Resp 13   Ht 5\' 5"  (1.651 m)   Wt 232 lb 12.8 oz (105.6 kg)   SpO2 98%   BMI 38.74 kg/m  Wt Readings from Last 3 Encounters:  06/25/20 232 lb 12.8 oz (105.6 kg)  02/17/20 230 lb (104.3 kg)  12/26/19 239 lb 3.2 oz (108.5 kg)    Diabetic Foot Exam - Simple   No data filed     Lab Results  Component Value Date   WBC 7.3 04/05/2020   HGB 12.9 04/05/2020   HCT 38.6 04/05/2020   PLT 192.0 04/05/2020   GLUCOSE 112 (H) 04/05/2020   CHOL 172 04/05/2020   TRIG 130.0 04/05/2020   HDL 78.90 04/05/2020   LDLDIRECT 108.0 08/11/2019   LDLCALC 67 04/05/2020   ALT 22 04/05/2020   AST 30 04/05/2020   NA 143 04/05/2020   K 4.1 04/05/2020   CL 103 04/05/2020   CREATININE 0.55 04/05/2020   BUN 10 04/05/2020   CO2 31 04/05/2020   TSH 2.82 04/05/2020   HGBA1C 5.5 04/05/2020    Lab Results  Component Value Date   TSH 2.82 04/05/2020   Lab Results  Component Value Date   WBC 7.3 04/05/2020   HGB 12.9 04/05/2020   HCT 38.6 04/05/2020   MCV 103.5 (H) 04/05/2020   PLT 192.0 04/05/2020   Lab Results   Component Value Date   NA 143 04/05/2020   K 4.1 04/05/2020   CO2 31 04/05/2020   GLUCOSE 112 (H) 04/05/2020   BUN 10 04/05/2020   CREATININE 0.55 04/05/2020   BILITOT 0.7 04/05/2020   ALKPHOS 72 04/05/2020   AST 30 04/05/2020   ALT 22 04/05/2020   PROT 6.1 04/05/2020   ALBUMIN 4.0 04/05/2020   CALCIUM 10.6 (H) 04/05/2020   CALCIUM 10.4 04/05/2020   GFR 113.35 04/05/2020   Lab Results  Component Value Date   CHOL 172 04/05/2020   Lab Results  Component Value Date   HDL 78.90 04/05/2020   Lab Results  Component Value Date   LDLCALC 67 04/05/2020   Lab Results  Component Value Date   TRIG 130.0 04/05/2020   Lab Results  Component Value Date   CHOLHDL 2 04/05/2020   Lab Results  Component Value Date   HGBA1C 5.5 04/05/2020       Assessment & Plan:   Problem List Items Addressed This Visit    Hypothyroidism    On Levothyroxine, continue to monitor      Relevant Orders   TSH   Hyperlipidemia    Encouraged heart healthy diet, increase exercise, avoid trans fats, consider a krill oil cap daily, tolerating Atorvastatin      Relevant Orders   Lipid panel   Essential hypertension  Well controlled, no changes to meds. Encouraged heart healthy diet such as the DASH diet and exercise as tolerated.       Relevant Orders   CBC   Comprehensive metabolic panel   TSH   Low back pain radiating to right lower extremity    Encouraged moist heat and gentle stretching as tolerated. May try NSAIDs and prescription meds as directed and report if symptoms worsen or seek immediate care. Doing well on current meds no changes today      Vitamin D deficiency   Relevant Orders   VITAMIN D 25 Hydroxy (Vit-D Deficiency, Fractures)   Hyperglycemia    hgba1c acceptable, minimize simple carbs. Increase exercise as tolerated.       Relevant Orders   Hemoglobin A1c    Other Visit Diagnoses    Post-menopausal bleeding    -  Primary   Relevant Orders   Ambulatory  referral to Obstetrics / Gynecology      I am having [name] [name] "Candace" maintain her beclomethasone, FLUoxetine, lisinopril, metoprolol succinate, atorvastatin, fentaNYL, Vitamin D (Ergocalciferol), and oxyCODONE.  No orders of the defined types were placed in this encounter.    Penni Homans, MD

## 2020-06-25 NOTE — Assessment & Plan Note (Signed)
Encouraged moist heat and gentle stretching as tolerated. May try NSAIDs and prescription meds as directed and report if symptoms worsen or seek immediate care. Doing well on current meds no changes today

## 2020-06-25 NOTE — Assessment & Plan Note (Addendum)
Encouraged heart healthy diet, increase exercise, avoid trans fats, consider a krill oil cap daily, tolerating Atorvastatin 

## 2020-06-25 NOTE — Patient Instructions (Addendum)
Vitamin D 3000 IU daily Postmenopausal Bleeding  Postmenopausal bleeding is any bleeding that a woman has after she has entered into menopause. Menopause is the end of a woman's fertile years. After menopause, a woman no longer ovulates and does not have menstrual periods. Postmenopausal bleeding may have various causes, including:  Menopausal hormone therapy (MHT).  Endometrial atrophy. After menopause, low estrogen hormone levels cause the membrane that lines the uterus (endometrium) to become thinner. You may have bleeding as the endometrium thins.  Endometrial hyperplasia. This condition is caused by excess estrogen hormones and low levels of progesterone hormones. The excess estrogen causes the endometrium to thicken, which can lead to bleeding. In some cases, this can lead to cancer of the uterus.  Endometrial cancer.  Non-cancerous growths (polyps) on the endometrium, the lining of the uterus, or the cervix.  Uterine fibroids. These are non-cancerous growths in or around the uterus muscle tissue that can cause heavy bleeding. Any type of postmenopausal bleeding, even if it appears to be a typical menstrual period, should be evaluated by your health care provider. Treatment will depend on the cause of the bleeding. Follow these instructions at home:  Pay attention to any changes in your symptoms.  Avoid using tampons and douches as told by your health care provider.  Change your pads regularly.  Get regular pelvic exams and Pap tests.  Take iron supplements as told by your health care provider.  Take over-the-counter and prescription medicines only as told by your health care provider.  Keep all follow-up visits as told by your health care provider. This is important. Contact a health care provider if:  Your bleeding lasts more than 1 week.  You have abdominal pain.  You have bleeding with or after sexual intercourse.  You have bleeding that happens more often than  every 3 weeks. Get help right away if:  You have a fever, chills, headache, dizziness, muscle aches, and bleeding.  You have severe pain with bleeding.  You are passing blood clots.  You have heavy bleeding, need more than 1 pad an hour, and have never experienced this before.  You feel faint. Summary  Postmenopausal bleeding is any bleeding that a woman has after she has entered into menopause.  Postmenopausal bleeding may have various causes. Treatment will depend on the cause of the bleeding.  Any type of postmenopausal bleeding, even if it appears to be a typical menstrual period, should be evaluated by your health care provider.  Be sure to pay attention to any changes in your symptoms and keep all follow-up visits as told by your health care provider. This information is not intended to replace advice given to you by your health care provider. Make sure you discuss any questions you have with your health care provider. Document Revised: 12/23/2017 Document Reviewed: 12/09/2016 Elsevier Patient Education  [place].

## 2020-06-26 MED ORDER — FENTANYL 25 MCG/HR TD PT72
1.0000 | MEDICATED_PATCH | TRANSDERMAL | 0 refills | Status: DC
Start: 2020-06-26 — End: 2020-08-08

## 2020-06-26 NOTE — Telephone Encounter (Signed)
Can you deny this request please? You may already refilled today, says I don't have the clearance to d/c it.

## 2020-06-26 NOTE — Telephone Encounter (Signed)
Requesting:Fentanyl patch Contract:none UDS:04/05/2020 Last Visit:06/25/2020 Next Visit: 08/13/2020 Last Refill:05/14/2020  Please Advise

## 2020-06-27 ENCOUNTER — Telehealth: Payer: Self-pay | Admitting: Orthopaedic Surgery

## 2020-06-27 ENCOUNTER — Encounter: Payer: Self-pay | Admitting: Family Medicine

## 2020-06-27 NOTE — Telephone Encounter (Signed)
Pt called stating she would like to see about getting an MRI; she previously had one and had to cancel so she would like for another referral to be sent  [phone]

## 2020-06-28 ENCOUNTER — Other Ambulatory Visit: Payer: Self-pay

## 2020-06-28 DIAGNOSIS — M25531 Pain in right wrist: Secondary | ICD-10-CM

## 2020-06-28 NOTE — Telephone Encounter (Signed)
They will call her to schedule MRI Appt.

## 2020-06-28 NOTE — Telephone Encounter (Signed)
yes

## 2020-06-28 NOTE — Telephone Encounter (Signed)
Order made

## 2020-06-28 NOTE — Telephone Encounter (Signed)
Ok, sounds good.    Kathlee Nations, can you arrange mri with contrast for the right wrist?

## 2020-06-28 NOTE — Telephone Encounter (Signed)
Do you want contrast for the wrist MRI?  Looks like right volar wrist ganglion cyst that she wants removed

## 2020-06-29 ENCOUNTER — Telehealth: Payer: Self-pay | Admitting: Orthopaedic Surgery

## 2020-06-29 NOTE — Telephone Encounter (Signed)
Pt called stating she was supposed to have a MRI for her wrist on 07/19/20 but then [facility] called her stating another referral had been put in so the pt would like a CB with an update on what the second MRI is for.   [phone]

## 2020-06-29 NOTE — Telephone Encounter (Signed)
Order been approved and faxed to [facility]

## 2020-06-29 NOTE — Telephone Encounter (Signed)
They will change order for MRI right wrist to w/ contrast. They will contact patient to schedule appt.

## 2020-07-02 NOTE — Telephone Encounter (Signed)
Pharmacy has been notified and it has been taken care of.  Called patient as a follow-up and prescription has been filled and pick-up.

## 2020-07-05 ENCOUNTER — Ambulatory Visit: Payer: PRIVATE HEALTH INSURANCE | Admitting: Family Medicine

## 2020-07-05 NOTE — Progress Notes (Deleted)
[facility] [place] Phone: [phone] Subjective:    I'm seeing this patient by the request  of:  Mosie Lukes, MD  CC: low back pain   [id number]  [name] [name] is a 59 y.o. female coming in with complaint of low back pain. Patient states      Last imaging was in 2019.  Reviewed lumbar x-rays independently but did show moderate multilevel degenerative disc disease most prominent at L4-L5 with facet arthropathy  Fentanyl and oxycodone   Past Medical History:  Diagnosis Date  . Acute foot pain, right 01/13/2017  . Acute upper respiratory infections of unspecified site 06/12/2014  . Anxiety and depression    chronic  . Breast cancer screening 09/05/2016  . Cervical cancer screening 09/05/2016  . Chronic back pain   . Depression with anxiety 08/23/2008   Qualifier: Diagnosis of  By: Niel Hummer MD, Lorinda Creed   . Gout 05/05/2017  . History of colon polyps   . History of fibromyalgia    suspected   . Hypertension   . Insomnia   . Knee pain, right 06/24/2015  . Meningitis spinal   . Morbid obesity ([place])   . Ovarian cyst, bilateral   . Preventative health care 06/24/2015  . PTSD (post-traumatic stress disorder)   . Vitamin D deficiency 06/24/2015   Past Surgical History:  Procedure Laterality Date  . COLONOSCOPY    . OVARIAN CYST REMOVAL     Social History   Socioeconomic History  . Marital status: Divorced    Spouse name: Not on file  . Number of children: Not on file  . Years of education: Not on file  . Highest education level: Not on file  Occupational History  . Not on file  Tobacco Use  . Smoking status: Former Smoker    Quit date: 04/04/1989    Years since quitting: 31.2  . Smokeless tobacco: Never Used  Vaping Use  . Vaping Use: Never used  Substance and Sexual Activity  . Alcohol use: Yes    Comment: occassionally- 2 glassses of wine 2x/month  . Drug use: No    Comment: take meds as  prescribed  . Sexual activity: Not on file  Other Topics Concern  . Not on file  Social History Narrative  . Not on file   Social Determinants of Health   Financial Resource Strain:   . Difficulty of Paying Living Expenses: Not on file  Food Insecurity:   . Worried About Charity fundraiser in the Last Year: Not on file  . Ran Out of Food in the Last Year: Not on file  Transportation Needs:   . Lack of Transportation (Medical): Not on file  . Lack of Transportation (Non-Medical): Not on file  Physical Activity:   . Days of Exercise per Week: Not on file  . Minutes of Exercise per Session: Not on file  Stress:   . Feeling of Stress : Not on file  Social Connections:   . Frequency of Communication with Friends and Family: Not on file  . Frequency of Social Gatherings with Friends and Family: Not on file  . Attends Religious Services: Not on file  . Active Member of Clubs or Organizations: Not on file  . Attends Archivist Meetings: Not on file  . Marital Status: Not on file   Allergies  Allergen Reactions  . Cymbalta [Duloxetine Hcl] Other (See Comments)  Headaches  . Fiorinal [Butalbital-Aspirin-Caffeine] Other (See Comments)    Mental Clarity.   Family History  Problem Relation Age of Onset  . Rheum arthritis Father   . Diabetes Father   . Congestive Heart Failure Father   . Hypertension Father   . Stroke Father   . Cancer Maternal Uncle 28       colon  . Hip fracture Maternal Grandmother   . COPD Maternal Grandfather 87       colon  . Colon cancer Maternal Grandfather   . Lymphoma Paternal Grandmother   . Colon cancer Mother   . Breast cancer Mother        benign  . Colon cancer Maternal Aunt   . Breast cancer Maternal Great-grandmother   . Breast cancer Cousin        negative stage 3  . ADD / ADHD Neg Hx   . Depression Neg Hx   . Alcohol abuse Neg Hx   . Drug abuse Neg Hx   . Esophageal cancer Neg Hx      Current Outpatient Medications  (Cardiovascular):  .  atorvastatin (LIPITOR) 10 MG tablet, TAKE ONE TABLET BY MOUTH ONE TIME DAILY .  lisinopril (ZESTRIL) 10 MG tablet, TAKE ONE TABLET BY MOUTH ONE TIME DAILY .  metoprolol succinate (TOPROL-XL) 25 MG 24 hr tablet, TAKE ONE TABLET BY MOUTH ONE TIME DAILY  Current Outpatient Medications (Respiratory):  .  beclomethasone (BECONASE-AQ) 42 MCG/SPRAY nasal spray, Place 1 spray into both nostrils 2 (two) times daily. Dose is for each nostril.  Current Outpatient Medications (Analgesics):  .  fentaNYL (DURAGESIC) 25 MCG/HR, Place 1 patch onto the skin every 3 (three) days. Marland Kitchen  oxyCODONE (ROXICODONE) 15 MG immediate release tablet, Take 1 tablet (15 mg total) by mouth every 6 (six) hours as needed. for pain   Current Outpatient Medications (Other):  Marland Kitchen  FLUoxetine (PROZAC) 10 MG capsule, Take 1 capsule (10 mg total) by mouth daily. .  Vitamin D, Ergocalciferol, (DRISDOL) 1.25 MG (50000 UNIT) CAPS capsule, TAKE ONE CAPSULE BY MOUTH EVERY WEEK   Reviewed prior external information including notes and imaging from  primary care provider As well as notes that were available from care everywhere and other healthcare systems.  Past medical history, social, surgical and family history all reviewed in electronic medical record.  No pertanent information unless stated regarding to the chief complaint.   Review of Systems:  No headache, visual changes, nausea, vomiting, diarrhea, constipation, dizziness, abdominal pain, skin rash, fevers, chills, night sweats, weight loss, swollen lymph nodes, body aches, joint swelling, chest pain, shortness of breath, mood changes. POSITIVE muscle aches  Objective  There were no vitals taken for this visit.   General: No apparent distress alert and oriented x3 mood and affect normal, dressed appropriately.  HEENT: Pupils equal, extraocular movements intact  Respiratory: Patient's speak in full sentences and does not appear short of breath    Cardiovascular: No lower extremity edema, non tender, no erythema  Neuro: Cranial nerves II through XII are intact, neurovascularly intact in all extremities with 2+ DTRs and 2+ pulses.  Gait normal with good balance and coordination.  MSK:  Non tender with full range of motion and good stability and symmetric strength and tone of shoulders, elbows, wrist, hip, knee and ankles bilaterally.     Impression and Recommendations:     The above documentation has been reviewed and is accurate and complete Lyndal Pulley, DO

## 2020-07-09 ENCOUNTER — Other Ambulatory Visit: Payer: Self-pay | Admitting: Family Medicine

## 2020-07-09 MED ORDER — OXYCODONE HCL 15 MG PO TABS
15.0000 mg | ORAL_TABLET | Freq: Four times a day (QID) | ORAL | 0 refills | Status: DC | PRN
Start: 2020-07-09 — End: 2020-07-30

## 2020-07-09 NOTE — Telephone Encounter (Signed)
Last written : 06/11/20 Last ov: 06/25/20 Next ov: 08/13/20 Contract:  UDS: 04/05/20

## 2020-07-10 ENCOUNTER — Ambulatory Visit: Payer: PRIVATE HEALTH INSURANCE | Admitting: Family Medicine

## 2020-07-17 ENCOUNTER — Inpatient Hospital Stay: Admission: RE | Admit: 2020-07-17 | Payer: PRIVATE HEALTH INSURANCE | Source: Ambulatory Visit

## 2020-07-17 ENCOUNTER — Other Ambulatory Visit: Payer: PRIVATE HEALTH INSURANCE

## 2020-07-19 ENCOUNTER — Other Ambulatory Visit: Payer: PRIVATE HEALTH INSURANCE

## 2020-07-23 ENCOUNTER — Encounter: Payer: Self-pay | Admitting: Family Medicine

## 2020-07-24 ENCOUNTER — Encounter: Payer: Self-pay | Admitting: Family Medicine

## 2020-07-24 ENCOUNTER — Other Ambulatory Visit: Payer: Self-pay | Admitting: Family Medicine

## 2020-07-24 MED ORDER — METHYLPREDNISOLONE 4 MG PO TABS
ORAL_TABLET | ORAL | 0 refills | Status: DC
Start: 2020-07-24 — End: 2020-07-31

## 2020-07-24 MED ORDER — COLCHICINE 0.6 MG PO TABS: ORAL_TABLET | ORAL | 1 refills | Status: DC

## 2020-07-24 NOTE — Telephone Encounter (Signed)
Patient states she would like to some pain medication for gout

## 2020-07-24 NOTE — Telephone Encounter (Signed)
Patient called in reference to gout flare up

## 2020-07-25 NOTE — Telephone Encounter (Signed)
See other mychart message.

## 2020-07-25 NOTE — Telephone Encounter (Signed)
Called pt she was able to get medication last night is already feeling some relief. She is thankful for the quick response

## 2020-07-28 ENCOUNTER — Other Ambulatory Visit: Payer: Self-pay | Admitting: Family Medicine

## 2020-07-29 ENCOUNTER — Encounter: Payer: Self-pay | Admitting: Family Medicine

## 2020-07-30 ENCOUNTER — Other Ambulatory Visit: Payer: Self-pay | Admitting: Family Medicine

## 2020-07-30 MED ORDER — OXYCODONE HCL 15 MG PO TABS
15.0000 mg | ORAL_TABLET | ORAL | 0 refills | Status: DC | PRN
Start: 2020-07-30 — End: 2020-08-13

## 2020-07-30 NOTE — Telephone Encounter (Signed)
You already sent this today- can you deny?

## 2020-07-30 NOTE — Telephone Encounter (Signed)
Pt reports she has a appt tomorrow at 2 pm With provider.

## 2020-07-31 ENCOUNTER — Other Ambulatory Visit: Payer: Self-pay

## 2020-07-31 ENCOUNTER — Telehealth (INDEPENDENT_AMBULATORY_CARE_PROVIDER_SITE_OTHER): Payer: PRIVATE HEALTH INSURANCE | Admitting: Family Medicine

## 2020-07-31 DIAGNOSIS — R739 Hyperglycemia, unspecified: Secondary | ICD-10-CM

## 2020-07-31 DIAGNOSIS — I10 Essential (primary) hypertension: Secondary | ICD-10-CM

## 2020-07-31 DIAGNOSIS — K859 Acute pancreatitis without necrosis or infection, unspecified: Secondary | ICD-10-CM

## 2020-07-31 DIAGNOSIS — E039 Hypothyroidism, unspecified: Secondary | ICD-10-CM | POA: Diagnosis not present

## 2020-07-31 DIAGNOSIS — E785 Hyperlipidemia, unspecified: Secondary | ICD-10-CM | POA: Diagnosis not present

## 2020-07-31 DIAGNOSIS — M1A9XX0 Chronic gout, unspecified, without tophus (tophi): Secondary | ICD-10-CM

## 2020-07-31 DIAGNOSIS — E559 Vitamin D deficiency, unspecified: Secondary | ICD-10-CM

## 2020-07-31 MED ORDER — METHYLPREDNISOLONE 4 MG PO TABS: ORAL_TABLET | ORAL | 0 refills | Status: DC

## 2020-07-31 MED ORDER — COLCHICINE 0.6 MG PO TABS: ORAL_TABLET | ORAL | 1 refills | Status: DC

## 2020-08-01 ENCOUNTER — Telehealth: Payer: Self-pay | Admitting: *Deleted

## 2020-08-01 ENCOUNTER — Ambulatory Visit: Payer: PRIVATE HEALTH INSURANCE | Admitting: Obstetrics & Gynecology

## 2020-08-01 DIAGNOSIS — K859 Acute pancreatitis without necrosis or infection, unspecified: Secondary | ICD-10-CM | POA: Insufficient documentation

## 2020-08-01 NOTE — Assessment & Plan Note (Signed)
hgba1c acceptable, minimize simple carbs. Increase exercise as tolerated.  

## 2020-08-01 NOTE — Assessment & Plan Note (Signed)
She was recently hospitalized in [facility]. She is now feeling better and abstaining from alcohol. She was drinking 2-5 beverages a day but agrees to abstain. She is also advised to minimize fats and spicy foods, minimize proteins for next week or so then slowly increase protein intake while continuing to avoid fatty and rich foods. Check pancreatic levels. She has follow up with gastroenterology later this week.

## 2020-08-01 NOTE — Assessment & Plan Note (Signed)
Right big toe greater than left big toe and improving after treatment with steroids and colchicine. Increase water intake, check uric acid levels and consider allopurinol

## 2020-08-01 NOTE — Progress Notes (Signed)
Virtual Visit via Phone Note  I connected with [name] [name] on 07/31/20 at  2:00 PM EDT by a phone enabled telemedicine application and verified that I am speaking with the correct person using two identifiers.  Location: Patient: home, patient and provider in visit Provider: office   I discussed the limitations of evaluation and management by telemedicine and the availability of in person appointments. The patient expressed understanding and agreed to proceed. S Chism, CMA was able to get the patient set up on a video visit.     Subjective:    Patient ID: [name] [name], female    DOB: 1960-11-15, 59 y.o.   MRN: [id number]  Chief Complaint  Patient presents with  . Follow-up    HPI Patient is in today for hospital follow up. Notes her gout has been in both big toes right worse than left. After treatment with colchicine and steroids she is much better. She underwent this after her hospitalization for pancreatitis. She was admitted to [facility] from 10/8-10/15. She is feeling much better but still does have some mild abdominal discomfort and nausea. She has a follow up appointment with gastroenterology later this week. She is now avoiding alcohol and minimizing fats in the diet. She missed a jury summons due to her acute illness and needs a note to become excused. Denies CP/palp/SOB/HA/congestion/fevers or GU c/o. Taking meds as prescribed  Past Medical History:  Diagnosis Date  . Acute foot pain, right 01/13/2017  . Acute upper respiratory infections of unspecified site 06/12/2014  . Anxiety and depression    chronic  . Breast cancer screening 09/05/2016  . Cervical cancer screening 09/05/2016  . Chronic back pain   . Depression with anxiety 08/23/2008   Qualifier: Diagnosis of  By: Niel Hummer MD, Lorinda Creed   . Gout 05/05/2017  . History of colon polyps   . History of fibromyalgia    suspected   . Hypertension   . Insomnia   . Knee pain, right 06/24/2015  . Meningitis  spinal   . Morbid obesity ([place])   . Ovarian cyst, bilateral   . Preventative health care 06/24/2015  . PTSD (post-traumatic stress disorder)   . Vitamin D deficiency 06/24/2015    Past Surgical History:  Procedure Laterality Date  . COLONOSCOPY    . OVARIAN CYST REMOVAL      Family History  Problem Relation Age of Onset  . Rheum arthritis Father   . Diabetes Father   . Congestive Heart Failure Father   . Hypertension Father   . Stroke Father   . Cancer Maternal Uncle 44       colon  . Hip fracture Maternal Grandmother   . COPD Maternal Grandfather 87       colon  . Colon cancer Maternal Grandfather   . Lymphoma Paternal Grandmother   . Colon cancer Mother   . Breast cancer Mother        benign  . Colon cancer Maternal Aunt   . Breast cancer Maternal Great-grandmother   . Breast cancer Cousin        negative stage 3  . ADD / ADHD Neg Hx   . Depression Neg Hx   . Alcohol abuse Neg Hx   . Drug abuse Neg Hx   . Esophageal cancer Neg Hx     Social History   Socioeconomic History  . Marital status: Divorced    Spouse name: Not on file  . Number of children:  Not on file  . Years of education: Not on file  . Highest education level: Not on file  Occupational History  . Not on file  Tobacco Use  . Smoking status: Former Smoker    Quit date: 04/04/1989    Years since quitting: 31.3  . Smokeless tobacco: Never Used  Vaping Use  . Vaping Use: Never used  Substance and Sexual Activity  . Alcohol use: Yes    Comment: occassionally- 2 glassses of wine 2x/month  . Drug use: No    Comment: take meds as prescribed  . Sexual activity: Not on file  Other Topics Concern  . Not on file  Social History Narrative  . Not on file   Social Determinants of Health   Financial Resource Strain:   . Difficulty of Paying Living Expenses: Not on file  Food Insecurity:   . Worried About Charity fundraiser in the Last Year: Not on file  . Ran Out of Food in the Last Year: Not on  file  Transportation Needs:   . Lack of Transportation (Medical): Not on file  . Lack of Transportation (Non-Medical): Not on file  Physical Activity:   . Days of Exercise per Week: Not on file  . Minutes of Exercise per Session: Not on file  Stress:   . Feeling of Stress : Not on file  Social Connections:   . Frequency of Communication with Friends and Family: Not on file  . Frequency of Social Gatherings with Friends and Family: Not on file  . Attends Religious Services: Not on file  . Active Member of Clubs or Organizations: Not on file  . Attends Archivist Meetings: Not on file  . Marital Status: Not on file  Intimate Partner Violence:   . Fear of Current or Ex-Partner: Not on file  . Emotionally Abused: Not on file  . Physically Abused: Not on file  . Sexually Abused: Not on file    Outpatient Medications Prior to Visit  Medication Sig Dispense Refill  . atorvastatin (LIPITOR) 10 MG tablet TAKE ONE TABLET BY MOUTH ONE TIME DAILY 90 tablet 1  . beclomethasone (BECONASE-AQ) 42 MCG/SPRAY nasal spray Place 1 spray into both nostrils 2 (two) times daily. Dose is for each nostril. 25 g 1  . fentaNYL (DURAGESIC) 25 MCG/HR Place 1 patch onto the skin every 3 (three) days. 10 patch 0  . FLUoxetine (PROZAC) 10 MG capsule Take 1 capsule (10 mg total) by mouth daily. 90 capsule 3  . lisinopril (ZESTRIL) 10 MG tablet TAKE ONE TABLET BY MOUTH ONE TIME DAILY 90 tablet 1  . metoprolol succinate (TOPROL-XL) 25 MG 24 hr tablet TAKE ONE TABLET BY MOUTH ONE TIME DAILY 90 tablet 1  . oxyCODONE (ROXICODONE) 15 MG immediate release tablet Take 1 tablet (15 mg total) by mouth every 4 (four) hours as needed. for pain 140 tablet 0  . colchicine 0.6 MG tablet 2 tabs po once then 1 tab po q 2 hours prn pain til pain gone, max of 6 tabs in 24 hours or intolerable diarrhea 6 tablet 1  . methylPREDNISolone (MEDROL) 4 MG tablet 5 tab po qd X 1d then 4 tab po qd X 1d then 3 tab po qd X 1d then 2 tab  po qd then 1 tab po qd 15 tablet 0  . Vitamin D, Ergocalciferol, (DRISDOL) 1.25 MG (50000 UNIT) CAPS capsule TAKE ONE CAPSULE BY MOUTH EVERY WEEK (Patient not taking: Reported on 07/31/2020) 12  capsule 0   No facility-administered medications prior to visit.    Allergies  Allergen Reactions  . Cymbalta [Duloxetine Hcl] Other (See Comments)    Headaches  . Fiorinal [Butalbital-Aspirin-Caffeine] Other (See Comments)    Mental Clarity.    Review of Systems  Constitutional: Positive for malaise/fatigue. Negative for fever.  HENT: Negative for congestion.   Eyes: Negative for blurred vision.  Respiratory: Negative for shortness of breath.   Cardiovascular: Negative for chest pain, palpitations and leg swelling.  Gastrointestinal: Positive for abdominal pain and nausea. Negative for blood in stool and vomiting.  Genitourinary: Negative for dysuria and frequency.  Musculoskeletal: Negative for falls.  Skin: Negative for rash.  Neurological: Negative for dizziness, loss of consciousness and headaches.  Endo/Heme/Allergies: Negative for environmental allergies.  Psychiatric/Behavioral: Negative for depression. The patient is not nervous/anxious.        Objective:    Physical Exam unable to obtain via phone visit  There were no vitals taken for this visit. Wt Readings from Last 3 Encounters:  06/25/20 232 lb 12.8 oz (105.6 kg)  02/17/20 230 lb (104.3 kg)  12/26/19 239 lb 3.2 oz (108.5 kg)    Diabetic Foot Exam - Simple   No data filed     Lab Results  Component Value Date   WBC 7.3 04/05/2020   HGB 12.9 04/05/2020   HCT 38.6 04/05/2020   PLT 192.0 04/05/2020   GLUCOSE 112 (H) 04/05/2020   CHOL 172 04/05/2020   TRIG 130.0 04/05/2020   HDL 78.90 04/05/2020   LDLDIRECT 108.0 08/11/2019   LDLCALC 67 04/05/2020   ALT 22 04/05/2020   AST 30 04/05/2020   NA 143 04/05/2020   K 4.1 04/05/2020   CL 103 04/05/2020   CREATININE 0.55 04/05/2020   BUN 10 04/05/2020   CO2 31  04/05/2020   TSH 2.82 04/05/2020   HGBA1C 5.5 04/05/2020    Lab Results  Component Value Date   TSH 2.82 04/05/2020   Lab Results  Component Value Date   WBC 7.3 04/05/2020   HGB 12.9 04/05/2020   HCT 38.6 04/05/2020   MCV 103.5 (H) 04/05/2020   PLT 192.0 04/05/2020   Lab Results  Component Value Date   NA 143 04/05/2020   K 4.1 04/05/2020   CO2 31 04/05/2020   GLUCOSE 112 (H) 04/05/2020   BUN 10 04/05/2020   CREATININE 0.55 04/05/2020   BILITOT 0.7 04/05/2020   ALKPHOS 72 04/05/2020   AST 30 04/05/2020   ALT 22 04/05/2020   PROT 6.1 04/05/2020   ALBUMIN 4.0 04/05/2020   CALCIUM 10.6 (H) 04/05/2020   CALCIUM 10.4 04/05/2020   GFR 113.35 04/05/2020   Lab Results  Component Value Date   CHOL 172 04/05/2020   Lab Results  Component Value Date   HDL 78.90 04/05/2020   Lab Results  Component Value Date   LDLCALC 67 04/05/2020   Lab Results  Component Value Date   TRIG 130.0 04/05/2020   Lab Results  Component Value Date   CHOLHDL 2 04/05/2020   Lab Results  Component Value Date   HGBA1C 5.5 04/05/2020       Assessment & Plan:   Problem List Items Addressed This Visit    Hypothyroidism - Primary   Hyperlipidemia   Relevant Orders   Lipid panel   Essential hypertension    Monitor and report any concerns, no changes to meds. Encouraged heart healthy diet such as the DASH diet and exercise as tolerated.  Relevant Orders   CBC with Differential/Platelet   Comprehensive metabolic panel   TSH   Vitamin D deficiency    Supplement and monitor      Relevant Orders   VITAMIN D 25 Hydroxy (Vit-D Deficiency, Fractures)   Gout    Right big toe greater than left big toe and improving after treatment with steroids and colchicine. Increase water intake, check uric acid levels and consider allopurinol      Relevant Orders   Uric acid   Hyperglycemia    hgba1c acceptable, minimize simple carbs. Increase exercise as tolerated.       Relevant  Orders   Hemoglobin A1c   Acute pancreatitis    She was recently hospitalized in [facility]. She is now feeling better and abstaining from alcohol. She was drinking 2-5 beverages a day but agrees to abstain. She is also advised to minimize fats and spicy foods, minimize proteins for next week or so then slowly increase protein intake while continuing to avoid fatty and rich foods. Check pancreatic levels. She has follow up with gastroenterology later this week.      Relevant Orders   Amylase   Lipase      I am having Shauntea C. Marchesi "Candace" maintain her beclomethasone, FLUoxetine, lisinopril, metoprolol succinate, atorvastatin, Vitamin D (Ergocalciferol), fentaNYL, oxyCODONE, methylPREDNISolone, and colchicine.  Meds ordered this encounter  Medications  . methylPREDNISolone (MEDROL) 4 MG tablet    Sig: 5 tab po qd X 1d then 4 tab po qd X 1d then 3 tab po qd X 1d then 2 tab po qd then 1 tab po qd    Dispense:  15 tablet    Refill:  0  . colchicine 0.6 MG tablet    Sig: 2 tabs po once then 1 tab po q 2 hours prn pain til pain gone, max of 6 tabs in 24 hours or intolerable diarrhea    Dispense:  6 tablet    Refill:  1     I discussed the assessment and treatment plan with the patient. The patient was provided an opportunity to ask questions and all were answered. The patient agreed with the plan and demonstrated an understanding of the instructions.   The patient was advised to call back or seek an in-person evaluation if the symptoms worsen or if the condition fails to improve as anticipated.  I provided 25 minutes of non-face-to-face time during this encounter.   Penni Homans, MD

## 2020-08-01 NOTE — Assessment & Plan Note (Signed)
Supplement and monitor 

## 2020-08-01 NOTE — Telephone Encounter (Signed)
From Dr. Wendie Agreste from Tuesdays schedule needs a note excusing her for jury duty for acute illness and hospitalizations.

## 2020-08-01 NOTE — Assessment & Plan Note (Signed)
Monitor and report any concerns, no changes to meds. Encouraged heart healthy diet such as the DASH diet and exercise as tolerated.  ?

## 2020-08-02 NOTE — Telephone Encounter (Signed)
Spoke with patient.   She was suppose to report to jury duty on 07/25/20 in [place].  She is going to call her judge friend to see if she may even still need a letter.  I advised her that she is to call me back.  If letter is needed I will be happy to get it for her.  Juror number is [id number].

## 2020-08-06 ENCOUNTER — Encounter: Payer: Self-pay | Admitting: Family Medicine

## 2020-08-07 ENCOUNTER — Encounter: Payer: Self-pay | Admitting: Family Medicine

## 2020-08-07 NOTE — Telephone Encounter (Signed)
Called pt virtual visit being created on 08/20/20 for pt to discuss taper with provider

## 2020-08-07 NOTE — Telephone Encounter (Signed)
Called pharmacy pt picked up on 08/03/20 has 22 days worth of medication. Will be complete on 08/25/20 thanks giving weekend. I will call and get virtual scheduled

## 2020-08-08 ENCOUNTER — Encounter: Payer: Self-pay | Admitting: Family Medicine

## 2020-08-08 ENCOUNTER — Other Ambulatory Visit: Payer: Self-pay | Admitting: Family Medicine

## 2020-08-08 MED ORDER — FENTANYL 25 MCG/HR TD PT72: 1.0000 | MEDICATED_PATCH | TRANSDERMAL | 0 refills | Status: DC

## 2020-08-08 NOTE — Telephone Encounter (Signed)
Requesting: Fentanyl 26mcg/hr Contract: 08/30/2019 UDS: 04/05/2020 Last Visit: 07/31/2020 Next Visit: 08/20/2020  Last Refill: 06/26/2020 #10 patch and 0RF Pt sig: 1 patch every 3 days.   Please Advise

## 2020-08-10 ENCOUNTER — Encounter: Payer: Self-pay | Admitting: Family Medicine

## 2020-08-10 NOTE — Telephone Encounter (Signed)
Replied to patient on most recent mychart message. See other encounter.

## 2020-08-10 NOTE — Telephone Encounter (Signed)
Patient calling back at  4pm to see if new script will be called in today or soon for her.  Made no promises to patient. I did let her know that Dr. Jacinto Reap does have her previous response

## 2020-08-13 ENCOUNTER — Encounter: Payer: PRIVATE HEALTH INSURANCE | Admitting: Family Medicine

## 2020-08-13 ENCOUNTER — Other Ambulatory Visit: Payer: Self-pay | Admitting: Family Medicine

## 2020-08-13 MED ORDER — OXYCODONE HCL 20 MG PO TABS: 20.0000 mg | ORAL_TABLET | Freq: Four times a day (QID) | ORAL | 0 refills | Status: DC | PRN

## 2020-08-16 ENCOUNTER — Other Ambulatory Visit: Payer: Self-pay | Admitting: Family Medicine

## 2020-08-16 NOTE — Addendum Note (Signed)
Addended by: Kelle Darting A on: 08/16/2020 03:15 PM   Modules accepted: Orders

## 2020-08-20 ENCOUNTER — Telehealth: Payer: PRIVATE HEALTH INSURANCE | Admitting: Family Medicine

## 2020-08-22 ENCOUNTER — Other Ambulatory Visit (INDEPENDENT_AMBULATORY_CARE_PROVIDER_SITE_OTHER): Payer: PRIVATE HEALTH INSURANCE

## 2020-08-22 ENCOUNTER — Other Ambulatory Visit: Payer: Self-pay

## 2020-08-22 DIAGNOSIS — M1A9XX0 Chronic gout, unspecified, without tophus (tophi): Secondary | ICD-10-CM

## 2020-08-22 DIAGNOSIS — E785 Hyperlipidemia, unspecified: Secondary | ICD-10-CM

## 2020-08-22 DIAGNOSIS — E559 Vitamin D deficiency, unspecified: Secondary | ICD-10-CM | POA: Diagnosis not present

## 2020-08-22 DIAGNOSIS — K859 Acute pancreatitis without necrosis or infection, unspecified: Secondary | ICD-10-CM

## 2020-08-22 DIAGNOSIS — R739 Hyperglycemia, unspecified: Secondary | ICD-10-CM

## 2020-08-22 DIAGNOSIS — I10 Essential (primary) hypertension: Secondary | ICD-10-CM

## 2020-08-22 NOTE — Addendum Note (Signed)
Addended by: Kelle Darting A on: 08/22/2020 02:48 PM   Modules accepted: Orders

## 2020-08-23 LAB — CBC WITH DIFFERENTIAL/PLATELET
Absolute Monocytes: 539 cells/uL (ref 200–950)
Basophils Absolute: 31 cells/uL (ref 0–200)
Basophils Relative: 0.5 %
Eosinophils Absolute: 260 cells/uL (ref 15–500)
Eosinophils Relative: 4.2 %
HCT: 36.7 % (ref 35.0–45.0)
Hemoglobin: 12.4 g/dL (ref 11.7–15.5)
Lymphs Abs: 1786 cells/uL (ref 850–3900)
MCH: 32 pg (ref 27.0–33.0)
MCHC: 33.8 g/dL (ref 32.0–36.0)
MCV: 94.6 fL (ref 80.0–100.0)
MPV: 11.6 fL (ref 7.5–12.5)
Monocytes Relative: 8.7 %
Neutro Abs: 3584 cells/uL (ref 1500–7800)
Neutrophils Relative %: 57.8 %
Platelets: 192 10*3/uL (ref 140–400)
RBC: 3.88 10*6/uL (ref 3.80–5.10)
RDW: 12.5 % (ref 11.0–15.0)
Total Lymphocyte: 28.8 %
WBC: 6.2 10*3/uL (ref 3.8–10.8)

## 2020-08-23 LAB — COMPREHENSIVE METABOLIC PANEL
AG Ratio: 1.9 (calc) (ref 1.0–2.5)
ALT: 16 U/L (ref 6–29)
AST: 20 U/L (ref 10–35)
Albumin: 4.2 g/dL (ref 3.6–5.1)
Alkaline phosphatase (APISO): 56 U/L (ref 37–153)
BUN/Creatinine Ratio: 17 (calc) (ref 6–22)
BUN: 8 mg/dL (ref 7–25)
CO2: 29 mmol/L (ref 20–32)
Calcium: 10.8 mg/dL — ABNORMAL HIGH (ref 8.6–10.4)
Chloride: 103 mmol/L (ref 98–110)
Creat: 0.48 mg/dL — ABNORMAL LOW (ref 0.50–1.05)
Globulin: 2.2 g/dL (calc) (ref 1.9–3.7)
Glucose, Bld: 105 mg/dL — ABNORMAL HIGH (ref 65–99)
Potassium: 3.8 mmol/L (ref 3.5–5.3)
Sodium: 139 mmol/L (ref 135–146)
Total Bilirubin: 0.5 mg/dL (ref 0.2–1.2)
Total Protein: 6.4 g/dL (ref 6.1–8.1)

## 2020-08-23 LAB — HEMOGLOBIN A1C
Hgb A1c MFr Bld: 5.8 % of total Hgb — ABNORMAL HIGH (ref <5.7)
Mean Plasma Glucose: 120 (calc)
eAG (mmol/L): 6.6 (calc)

## 2020-08-23 LAB — LIPID PANEL
Cholesterol: 153 mg/dL (ref <200)
HDL: 49 mg/dL — ABNORMAL LOW (ref >=50)
LDL Cholesterol (Calc): 82 mg/dL (calc)
Non-HDL Cholesterol (Calc): 104 mg/dL (calc) (ref <130)
Total CHOL/HDL Ratio: 3.1 (calc) (ref <5.0)
Triglycerides: 123 mg/dL (ref <150)

## 2020-08-23 LAB — VITAMIN D 25 HYDROXY (VIT D DEFICIENCY, FRACTURES): Vit D, 25-Hydroxy: 57 ng/mL (ref 30–100)

## 2020-08-23 LAB — URIC ACID: Uric Acid, Serum: 5.6 mg/dL (ref 2.5–7.0)

## 2020-08-23 LAB — TSH: TSH: 1.96 mIU/L (ref 0.40–4.50)

## 2020-08-23 LAB — LIPASE: Lipase: 34 U/L (ref 7–60)

## 2020-08-23 LAB — AMYLASE: Amylase: 18 U/L — ABNORMAL LOW (ref 21–101)

## 2020-08-28 ENCOUNTER — Other Ambulatory Visit: Payer: Self-pay

## 2020-08-28 ENCOUNTER — Encounter: Payer: Self-pay | Admitting: Family Medicine

## 2020-08-28 ENCOUNTER — Ambulatory Visit (INDEPENDENT_AMBULATORY_CARE_PROVIDER_SITE_OTHER): Payer: PRIVATE HEALTH INSURANCE | Admitting: Family Medicine

## 2020-08-28 VITALS — BP 108/66 | HR 85 | Temp 97.8°F | Resp 18 | Ht 65.0 in | Wt 215.4 lb

## 2020-08-28 DIAGNOSIS — Z86010 Personal history of colonic polyps: Secondary | ICD-10-CM

## 2020-08-28 DIAGNOSIS — Z0000 Encounter for general adult medical examination without abnormal findings: Secondary | ICD-10-CM

## 2020-08-28 DIAGNOSIS — E785 Hyperlipidemia, unspecified: Secondary | ICD-10-CM

## 2020-08-28 DIAGNOSIS — I10 Essential (primary) hypertension: Secondary | ICD-10-CM

## 2020-08-28 DIAGNOSIS — M1A9XX0 Chronic gout, unspecified, without tophus (tophi): Secondary | ICD-10-CM

## 2020-08-28 DIAGNOSIS — E039 Hypothyroidism, unspecified: Secondary | ICD-10-CM | POA: Diagnosis not present

## 2020-08-28 DIAGNOSIS — M79604 Pain in right leg: Secondary | ICD-10-CM

## 2020-08-28 DIAGNOSIS — Z23 Encounter for immunization: Secondary | ICD-10-CM | POA: Diagnosis not present

## 2020-08-28 DIAGNOSIS — E559 Vitamin D deficiency, unspecified: Secondary | ICD-10-CM

## 2020-08-28 DIAGNOSIS — R109 Unspecified abdominal pain: Secondary | ICD-10-CM

## 2020-08-28 DIAGNOSIS — R739 Hyperglycemia, unspecified: Secondary | ICD-10-CM

## 2020-08-28 DIAGNOSIS — M545 Low back pain, unspecified: Secondary | ICD-10-CM

## 2020-08-28 DIAGNOSIS — K859 Acute pancreatitis without necrosis or infection, unspecified: Secondary | ICD-10-CM

## 2020-08-28 DIAGNOSIS — Z9889 Other specified postprocedural states: Secondary | ICD-10-CM

## 2020-08-28 NOTE — Patient Instructions (Addendum)
Shingrix is the new shingles shot. 2 shots over 2-6 months, call insurance regarding coverage and document. If they cover can make a nurse visit to start series  MIND diet for weight loss or NOOM APP or Dunn APP Preventive Care 10-58 Years Old, Female Preventive care refers to visits with your health care provider and lifestyle choices that can promote health and wellness. This includes:  A yearly physical exam. This may also be called an annual well check.  Regular dental visits and eye exams.  Immunizations.  Screening for certain conditions.  Healthy lifestyle choices, such as eating a healthy diet, getting regular exercise, not using drugs or products that contain nicotine and tobacco, and limiting alcohol use. What can I expect for my preventive care visit? Physical exam Your health care provider will check your:  Height and weight. This may be used to calculate body mass index (BMI), which tells if you are at a healthy weight.  Heart rate and blood pressure.  Skin for abnormal spots. Counseling Your health care provider may ask you questions about your:  Alcohol, tobacco, and drug use.  Emotional well-being.  Home and relationship well-being.  Sexual activity.  Eating habits.  Work and work Statistician.  Method of birth control.  Menstrual cycle.  Pregnancy history. What immunizations do I need?  Influenza (flu) vaccine  This is recommended every year. Tetanus, diphtheria, and pertussis (Tdap) vaccine  You may need a Td booster every 10 years. Varicella (chickenpox) vaccine  You may need this if you have not been vaccinated. Zoster (shingles) vaccine  You may need this after age 66. Measles, mumps, and rubella (MMR) vaccine  You may need at least one dose of MMR if you were born in 1957 or later. You may also need a second dose. Pneumococcal conjugate (PCV13) vaccine  You may need this if you have certain conditions and were not previously  vaccinated. Pneumococcal polysaccharide (PPSV23) vaccine  You may need one or two doses if you smoke cigarettes or if you have certain conditions. Meningococcal conjugate (MenACWY) vaccine  You may need this if you have certain conditions. Hepatitis A vaccine  You may need this if you have certain conditions or if you travel or work in places where you may be exposed to hepatitis A. Hepatitis B vaccine  You may need this if you have certain conditions or if you travel or work in places where you may be exposed to hepatitis B. Haemophilus influenzae type b (Hib) vaccine  You may need this if you have certain conditions. Human papillomavirus (HPV) vaccine  If recommended by your health care provider, you may need three doses over 6 months. You may receive vaccines as individual doses or as more than one vaccine together in one shot (combination vaccines). Talk with your health care provider about the risks and benefits of combination vaccines. What tests do I need? Blood tests  Lipid and cholesterol levels. These may be checked every 5 years, or more frequently if you are over 85 years old.  Hepatitis C test.  Hepatitis B test. Screening  Lung cancer screening. You may have this screening every year starting at age 40 if you have a 30-pack-year history of smoking and currently smoke or have quit within the past 15 years.  Colorectal cancer screening. All adults should have this screening starting at age 52 and continuing until age 3. Your health care provider may recommend screening at age 53 if you are at increased risk. You will  have tests every 1-10 years, depending on your results and the type of screening test.  Diabetes screening. This is done by checking your blood sugar (glucose) after you have not eaten for a while (fasting). You may have this done every 1-3 years.  Mammogram. This may be done every 1-2 years. Talk with your health care provider about when you should start  having regular mammograms. This may depend on whether you have a family history of breast cancer.  BRCA-related cancer screening. This may be done if you have a family history of breast, ovarian, tubal, or peritoneal cancers.  Pelvic exam and Pap test. This may be done every 3 years starting at age 89. Starting at age 73, this may be done every 5 years if you have a Pap test in combination with an HPV test. Other tests  Sexually transmitted disease (STD) testing.  Bone density scan. This is done to screen for osteoporosis. You may have this scan if you are at high risk for osteoporosis. Follow these instructions at home: Eating and drinking  Eat a diet that includes fresh fruits and vegetables, whole grains, lean protein, and low-fat dairy.  Take vitamin and mineral supplements as recommended by your health care provider.  Do not drink alcohol if: ? Your health care provider tells you not to drink. ? You are pregnant, may be pregnant, or are planning to become pregnant.  If you drink alcohol: ? Limit how much you have to 0-1 drink a day. ? Be aware of how much alcohol is in your drink. In the [place] one drink equals one 12 oz bottle of beer (355 mL), one 5 oz glass of wine (148 mL), or one 1 oz glass of hard liquor (44 mL). Lifestyle  Take daily care of your teeth and gums.  Stay active. Exercise for at least 30 minutes on 5 or more days each week.  Do not use any products that contain nicotine or tobacco, such as cigarettes, e-cigarettes, and chewing tobacco. If you need help quitting, ask your health care provider.  If you are sexually active, practice safe sex. Use a condom or other form of birth control (contraception) in order to prevent pregnancy and STIs (sexually transmitted infections).  If told by your health care provider, take low-dose aspirin daily starting at age 80. What's next?  Visit your health care provider once a year for a well check visit.  Ask your health  care provider how often you should have your eyes and teeth checked.  Stay up to date on all vaccines. This information is not intended to replace advice given to you by your health care provider. Make sure you discuss any questions you have with your health care provider. Document Revised: 05/27/2018 Document Reviewed: 05/27/2018 Elsevier Patient Education  2020 [employer].

## 2020-08-30 ENCOUNTER — Encounter: Payer: Self-pay | Admitting: Family Medicine

## 2020-08-30 ENCOUNTER — Other Ambulatory Visit: Payer: Self-pay | Admitting: Family Medicine

## 2020-08-30 MED ORDER — CYCLOBENZAPRINE HCL 10 MG PO TABS
5.0000 mg | ORAL_TABLET | Freq: Two times a day (BID) | ORAL | 2 refills | Status: DC | PRN
Start: 2020-08-30 — End: 2021-01-09

## 2020-08-30 NOTE — Assessment & Plan Note (Signed)
On Levothyroxine, continue to monitor 

## 2020-08-30 NOTE — Assessment & Plan Note (Signed)
Had a colonoscopy with numerous polyps this year. Needs repeat in 2024

## 2020-08-30 NOTE — Assessment & Plan Note (Signed)
She has tolerated the drop in Oxycodone to 20 mg po qid prn but does note over night can be hard. She is encouraged to apply moist heat and lidocaine gel,take tylenol 1000 mg qhs and her muscle relaxer prior to bed and see if that helps

## 2020-08-30 NOTE — Assessment & Plan Note (Signed)
hgba1c acceptable, minimize simple carbs. Increase exercise as tolerated.  

## 2020-08-30 NOTE — Progress Notes (Signed)
Subjective:    Patient ID: [name] [name], female    DOB: June 24, 1961, 59 y.o.   MRN: [id number]  Chief Complaint  Patient presents with  . Annual Exam    HPI Patient is in today for annual preventative exam and follow up on chronic medical concerns. No recent febrile illness or hospitalizations. No c/o polyuria or polydipsia. She tolerated her first 2 COVID shots and is not due for third yet. She is trying to maintain a heart healthy diet. She is dealing with constant pain in back and right leg but she functions and manages her ADLs with current meds. Denies CP/palp/SOB/HA/congestion/fevers/GI or GU c/o. Taking meds as prescribed  Past Medical History:  Diagnosis Date  . Acute foot pain, right 01/13/2017  . Acute upper respiratory infections of unspecified site 06/12/2014  . Anxiety and depression    chronic  . Breast cancer screening 09/05/2016  . Cervical cancer screening 09/05/2016  . Chronic back pain   . Depression with anxiety 08/23/2008   Qualifier: Diagnosis of  By: Niel Hummer MD, Lorinda Creed   . Gout 05/05/2017  . History of colon polyps   . History of fibromyalgia    suspected   . Hypertension   . Insomnia   . Knee pain, right 06/24/2015  . Meningitis spinal   . Morbid obesity ([place])   . Ovarian cyst, bilateral   . Preventative health care 06/24/2015  . PTSD (post-traumatic stress disorder)   . Vitamin D deficiency 06/24/2015    Past Surgical History:  Procedure Laterality Date  . COLONOSCOPY    . OVARIAN CYST REMOVAL      Family History  Problem Relation Age of Onset  . Rheum arthritis Father   . Diabetes Father   . Congestive Heart Failure Father   . Hypertension Father   . Stroke Father   . Cancer Maternal Uncle 18       colon  . Hip fracture Maternal Grandmother   . COPD Maternal Grandfather 87       colon  . Colon cancer Maternal Grandfather   . Lymphoma Paternal Grandmother   . Colon cancer Mother   . Breast cancer Mother        benign  . Colon cancer  Maternal Aunt   . Breast cancer Maternal Great-grandmother   . Breast cancer Cousin        negative stage 3  . ADD / ADHD Neg Hx   . Depression Neg Hx   . Alcohol abuse Neg Hx   . Drug abuse Neg Hx   . Esophageal cancer Neg Hx     Social History   Socioeconomic History  . Marital status: Divorced    Spouse name: Not on file  . Number of children: Not on file  . Years of education: Not on file  . Highest education level: Not on file  Occupational History  . Not on file  Tobacco Use  . Smoking status: Former Smoker    Quit date: 04/04/1989    Years since quitting: 31.4  . Smokeless tobacco: Never Used  Vaping Use  . Vaping Use: Never used  Substance and Sexual Activity  . Alcohol use: Yes    Comment: occassionally- 2 glassses of wine 2x/month  . Drug use: No    Comment: take meds as prescribed  . Sexual activity: Not on file  Other Topics Concern  . Not on file  Social History Narrative  . Not on file   Social  Determinants of Health   Financial Resource Strain:   . Difficulty of Paying Living Expenses: Not on file  Food Insecurity:   . Worried About Charity fundraiser in the Last Year: Not on file  . Ran Out of Food in the Last Year: Not on file  Transportation Needs:   . Lack of Transportation (Medical): Not on file  . Lack of Transportation (Non-Medical): Not on file  Physical Activity:   . Days of Exercise per Week: Not on file  . Minutes of Exercise per Session: Not on file  Stress:   . Feeling of Stress : Not on file  Social Connections:   . Frequency of Communication with Friends and Family: Not on file  . Frequency of Social Gatherings with Friends and Family: Not on file  . Attends Religious Services: Not on file  . Active Member of Clubs or Organizations: Not on file  . Attends Archivist Meetings: Not on file  . Marital Status: Not on file  Intimate Partner Violence:   . Fear of Current or Ex-Partner: Not on file  . Emotionally Abused:  Not on file  . Physically Abused: Not on file  . Sexually Abused: Not on file    Outpatient Medications Prior to Visit  Medication Sig Dispense Refill  . atorvastatin (LIPITOR) 10 MG tablet TAKE ONE TABLET BY MOUTH ONE TIME DAILY 90 tablet 1  . beclomethasone (BECONASE-AQ) 42 MCG/SPRAY nasal spray Place 1 spray into both nostrils 2 (two) times daily. Dose is for each nostril. 25 g 1  . colchicine 0.6 MG tablet 2 tabs po once then 1 tab po q 2 hours prn pain til pain gone, max of 6 tabs in 24 hours or intolerable diarrhea 6 tablet 1  . fentaNYL (DURAGESIC) 25 MCG/HR Place 1 patch onto the skin every 3 (three) days. 10 patch 0  . FLUoxetine (PROZAC) 10 MG capsule Take 1 capsule (10 mg total) by mouth daily. 90 capsule 3  . lisinopril (ZESTRIL) 10 MG tablet TAKE ONE TABLET BY MOUTH ONE TIME DAILY 90 tablet 1  . methylPREDNISolone (MEDROL) 4 MG tablet 5 tab po qd X 1d then 4 tab po qd X 1d then 3 tab po qd X 1d then 2 tab po qd then 1 tab po qd 15 tablet 0  . metoprolol succinate (TOPROL-XL) 25 MG 24 hr tablet TAKE ONE TABLET BY MOUTH ONE TIME DAILY 90 tablet 1  . Oxycodone HCl 20 MG TABS Take 1 tablet (20 mg total) by mouth 4 (four) times daily as needed. 120 tablet 0  . Vitamin D, Ergocalciferol, (DRISDOL) 1.25 MG (50000 UNIT) CAPS capsule TAKE ONE CAPSULE BY MOUTH EVERY WEEK (Patient not taking: Reported on 07/31/2020) 12 capsule 0   No facility-administered medications prior to visit.    Allergies  Allergen Reactions  . Cymbalta [Duloxetine Hcl] Other (See Comments)    Headaches  . Fiorinal [Butalbital-Aspirin-Caffeine] Other (See Comments)    Mental Clarity.    Review of Systems  Constitutional: Positive for malaise/fatigue. Negative for chills and fever.  HENT: Negative for congestion and hearing loss.   Eyes: Negative for discharge.  Respiratory: Negative for cough, sputum production and shortness of breath.   Cardiovascular: Negative for chest pain, palpitations and leg swelling.   Gastrointestinal: Negative for abdominal pain, blood in stool, constipation, diarrhea, heartburn, nausea and vomiting.  Genitourinary: Negative for dysuria, frequency, hematuria and urgency.  Musculoskeletal: Positive for back pain, joint pain and myalgias. Negative for  falls.  Skin: Negative for rash.  Neurological: Negative for dizziness, sensory change, loss of consciousness, weakness and headaches.  Endo/Heme/Allergies: Negative for environmental allergies. Does not bruise/bleed easily.  Psychiatric/Behavioral: Negative for depression and suicidal ideas. The patient is not nervous/anxious and does not have insomnia.        Objective:    Physical Exam Constitutional:      General: She is not in acute distress.    Appearance: She is well-developed.  HENT:     Head: Normocephalic.  Eyes:     Conjunctiva/sclera: Conjunctivae normal.  Neck:     Thyroid: No thyromegaly.  Cardiovascular:     Rate and Rhythm: Normal rate and regular rhythm.     Heart sounds: Normal heart sounds. No murmur heard.   Pulmonary:     Effort: Pulmonary effort is normal. No respiratory distress.     Breath sounds: Normal breath sounds.  Abdominal:     General: Bowel sounds are normal. There is no distension.     Palpations: Abdomen is soft. There is no mass.     Tenderness: There is no abdominal tenderness.  Musculoskeletal:     Cervical back: Neck supple.  Lymphadenopathy:     Cervical: No cervical adenopathy.  Skin:    General: Skin is warm and dry.  Neurological:     Mental Status: She is alert and oriented to person, place, and time.  Psychiatric:        Behavior: Behavior normal.     BP 108/66   Pulse 85   Temp 97.8 F (36.6 C) (Oral)   Resp 18   Ht 5\' 5"  (1.651 m)   Wt 215 lb 6.4 oz (97.7 kg)   SpO2 95%   BMI 35.84 kg/m  Wt Readings from Last 3 Encounters:  08/28/20 215 lb 6.4 oz (97.7 kg)  06/25/20 232 lb 12.8 oz (105.6 kg)  02/17/20 230 lb (104.3 kg)    Diabetic Foot Exam  - Simple   No data filed     Lab Results  Component Value Date   WBC 6.2 08/22/2020   HGB 12.4 08/22/2020   HCT 36.7 08/22/2020   PLT 192 08/22/2020   GLUCOSE 105 (H) 08/22/2020   CHOL 153 08/22/2020   TRIG 123 08/22/2020   HDL 49 (L) 08/22/2020   LDLDIRECT 108.0 08/11/2019   LDLCALC 82 08/22/2020   ALT 16 08/22/2020   AST 20 08/22/2020   NA 139 08/22/2020   K 3.8 08/22/2020   CL 103 08/22/2020   CREATININE 0.48 (L) 08/22/2020   BUN 8 08/22/2020   CO2 29 08/22/2020   TSH 1.96 08/22/2020   HGBA1C 5.8 (H) 08/22/2020    Lab Results  Component Value Date   TSH 1.96 08/22/2020   Lab Results  Component Value Date   WBC 6.2 08/22/2020   HGB 12.4 08/22/2020   HCT 36.7 08/22/2020   MCV 94.6 08/22/2020   PLT 192 08/22/2020   Lab Results  Component Value Date   NA 139 08/22/2020   K 3.8 08/22/2020   CO2 29 08/22/2020   GLUCOSE 105 (H) 08/22/2020   BUN 8 08/22/2020   CREATININE 0.48 (L) 08/22/2020   BILITOT 0.5 08/22/2020   ALKPHOS 72 04/05/2020   AST 20 08/22/2020   ALT 16 08/22/2020   PROT 6.4 08/22/2020   ALBUMIN 4.0 04/05/2020   CALCIUM 10.8 (H) 08/22/2020   GFR 113.35 04/05/2020   Lab Results  Component Value Date   CHOL 153 08/22/2020  Lab Results  Component Value Date   HDL 49 (L) 08/22/2020   Lab Results  Component Value Date   LDLCALC 82 08/22/2020   Lab Results  Component Value Date   TRIG 123 08/22/2020   Lab Results  Component Value Date   CHOLHDL 3.1 08/22/2020   Lab Results  Component Value Date   HGBA1C 5.8 (H) 08/22/2020       Assessment & Plan:   Problem List Items Addressed This Visit    Hypothyroidism    On Levothyroxine, continue to monitor      Relevant Orders   TSH   Hyperlipidemia    Encouraged heart healthy diet, increase exercise, avoid trans fats, consider a krill oil cap daily      Relevant Orders   Lipid panel   Essential hypertension    Well controlled, no changes to meds. Encouraged heart healthy  diet such as the DASH diet and exercise as tolerated.       Relevant Orders   CBC   Comprehensive metabolic panel   TSH   Low back pain radiating to right lower extremity    She has tolerated the drop in Oxycodone to 20 mg po qid prn but does note over night can be hard. She is encouraged to apply moist heat and lidocaine gel,take tylenol 1000 mg qhs and her muscle relaxer prior to bed and see if that helps      Hypercalcemia   Relevant Orders   Magnesium   PTH, intact (no Ca)   Vitamin D deficiency    Supplement and monitor      Relevant Orders   VITAMIN D 25 Hydroxy (Vit-D Deficiency, Fractures)   Preventative health care    Patient encouraged to maintain heart healthy diet, regular exercise, adequate sleep. Consider daily probiotics. Take medications as prescribed. Given Tdap today. Has had her first 2 covid shots and is not due for her 3rd one yet. Is encouraged to get Shingrix soon. Colonoscopy was March 2021 and she needs repeat in 3 years. MGM UTD done in 2021 repeat annually. She has been referred to GYN but she did not go she agrees to reschedule. Given flu shot      Gout    Hydrate well and monitor uric acid      Relevant Orders   Uric acid   Hyperglycemia    hgba1c acceptable, minimize simple carbs. Increase exercise as tolerated.       Relevant Orders   Hemoglobin A1c   H/O colonoscopy with polypectomy    Had a colonoscopy with numerous polyps this year. Needs repeat in 2024      Acute pancreatitis    Abdominal pain much better but not completely resolved. Encouraged bland low fat diet and if her symptoms flare she needs to return to clear liquids and let us know.        Other Visit Diagnoses    Need for influenza vaccination    -  Primary   Relevant Orders   Flu Vaccine QUAD 36+ mos IM (Fluarix, Fluzone & Afluria Quad PF (Completed)   Need for Tdap vaccination       Relevant Orders   Tdap vaccine greater than or equal to 7yo IM (Completed)   Abdominal  pain, unspecified abdominal location       Relevant Orders   Lipase   Amylase      I am having Kameka C. Parcel "Candace" maintain her beclomethasone, FLUoxetine, lisinopril, metoprolol succinate, atorvastatin, Vitamin D (  Ergocalciferol), methylPREDNISolone, colchicine, fentaNYL, and Oxycodone HCl.  No orders of the defined types were placed in this encounter.    Penni Homans, MD

## 2020-08-30 NOTE — Assessment & Plan Note (Signed)
Supplement and monitor 

## 2020-08-30 NOTE — Assessment & Plan Note (Signed)
Abdominal pain much better but not completely resolved. Encouraged bland low fat diet and if her symptoms flare she needs to return to clear liquids and let us know.

## 2020-08-30 NOTE — Assessment & Plan Note (Addendum)
Patient encouraged to maintain heart healthy diet, regular exercise, adequate sleep. Consider daily probiotics. Take medications as prescribed. Given Tdap today. Has had her first 2 covid shots and is not due for her 3rd one yet. Is encouraged to get Shingrix soon. Colonoscopy was March 2021 and she needs repeat in 3 years. MGM UTD done in 2021 repeat annually. She has been referred to GYN but she did not go she agrees to reschedule. Given flu shot

## 2020-08-30 NOTE — Assessment & Plan Note (Signed)
Well controlled, no changes to meds. Encouraged heart healthy diet such as the DASH diet and exercise as tolerated.  °

## 2020-08-30 NOTE — Assessment & Plan Note (Signed)
Hydrate well and monitor uric acid

## 2020-08-30 NOTE — Assessment & Plan Note (Signed)
Encouraged heart healthy diet, increase exercise, avoid trans fats, consider a krill oil cap daily 

## 2020-09-06 ENCOUNTER — Ambulatory Visit: Payer: PRIVATE HEALTH INSURANCE | Admitting: Family Medicine

## 2020-09-07 ENCOUNTER — Other Ambulatory Visit: Payer: Self-pay | Admitting: Family Medicine

## 2020-09-07 MED ORDER — FENTANYL 25 MCG/HR TD PT72: 1.0000 | MEDICATED_PATCH | TRANSDERMAL | 0 refills | Status: DC

## 2020-09-07 MED ORDER — OXYCODONE HCL 20 MG PO TABS: 20.0000 mg | ORAL_TABLET | Freq: Four times a day (QID) | ORAL | 0 refills | Status: DC | PRN

## 2020-09-07 NOTE — Telephone Encounter (Signed)
Last written: fentanyl 08/08/20            Oxycodone 08/13/20 Last ov: 08/28/20 Next ov: 11/27/19 Contract: 08/11/19  UDS: 04/05/20

## 2020-09-18 ENCOUNTER — Other Ambulatory Visit: Payer: PRIVATE HEALTH INSURANCE

## 2020-10-01 ENCOUNTER — Encounter: Payer: Self-pay | Admitting: Family Medicine

## 2020-10-01 ENCOUNTER — Other Ambulatory Visit: Payer: Self-pay | Admitting: Family Medicine

## 2020-10-01 MED ORDER — COLCHICINE 0.6 MG PO TABS
ORAL_TABLET | ORAL | 2 refills | Status: DC
Start: 2020-10-01 — End: 2021-01-14

## 2020-10-01 MED ORDER — METHYLPREDNISOLONE 4 MG PO TABS
ORAL_TABLET | ORAL | 0 refills | Status: DC
Start: 2020-10-01 — End: 2020-10-25

## 2020-10-02 ENCOUNTER — Encounter: Payer: Self-pay | Admitting: Family Medicine

## 2020-10-03 ENCOUNTER — Other Ambulatory Visit: Payer: Self-pay | Admitting: Family Medicine

## 2020-10-03 ENCOUNTER — Encounter: Payer: Self-pay | Admitting: Family Medicine

## 2020-10-03 MED ORDER — OXYCODONE HCL 20 MG PO TABS: 20.0000 mg | ORAL_TABLET | Freq: Four times a day (QID) | ORAL | 0 refills | Status: DC | PRN

## 2020-10-04 ENCOUNTER — Other Ambulatory Visit: Payer: Self-pay | Admitting: Family Medicine

## 2020-10-04 MED ORDER — OXYCODONE HCL 15 MG PO TABS: 15.0000 mg | ORAL_TABLET | Freq: Four times a day (QID) | ORAL | 0 refills | Status: DC | PRN

## 2020-10-11 ENCOUNTER — Encounter: Payer: Self-pay | Admitting: Family Medicine

## 2020-10-11 ENCOUNTER — Other Ambulatory Visit: Payer: Self-pay | Admitting: Family Medicine

## 2020-10-12 ENCOUNTER — Other Ambulatory Visit: Payer: Self-pay | Admitting: Family Medicine

## 2020-10-12 MED ORDER — FENTANYL 25 MCG/HR TD PT72: 1.0000 | MEDICATED_PATCH | TRANSDERMAL | 0 refills | Status: DC

## 2020-10-12 NOTE — Telephone Encounter (Signed)
Requesting: Fentanyl 74mcg/hr patch Contract: 08/30/2019 UDS: 04/05/2020 Last Visit: 07/31/2020  Next Visit: 11/26/2020 Last Refill: 09/07/2020 #10 and 0RF  Please Advise

## 2020-10-25 ENCOUNTER — Other Ambulatory Visit: Payer: Self-pay | Admitting: Family Medicine

## 2020-10-25 ENCOUNTER — Encounter: Payer: Self-pay | Admitting: Family Medicine

## 2020-10-25 MED ORDER — METHYLPREDNISOLONE 4 MG PO TABS: ORAL_TABLET | ORAL | 0 refills | Status: DC

## 2020-10-26 ENCOUNTER — Encounter: Payer: Self-pay | Admitting: Family Medicine

## 2020-10-26 ENCOUNTER — Other Ambulatory Visit: Payer: Self-pay | Admitting: Family Medicine

## 2020-10-26 MED ORDER — ALLOPURINOL 100 MG PO TABS: 100.0000 mg | ORAL_TABLET | Freq: Every day | ORAL | 6 refills | Status: DC

## 2020-10-30 ENCOUNTER — Other Ambulatory Visit: Payer: Self-pay | Admitting: Family Medicine

## 2020-10-30 MED ORDER — OXYCODONE HCL 15 MG PO TABS: 15.0000 mg | ORAL_TABLET | Freq: Four times a day (QID) | ORAL | 0 refills | Status: DC | PRN

## 2020-10-30 NOTE — Telephone Encounter (Signed)
Requesting: oxycodone 15mg  Contract: 08/30/2019 UDS: 04/05/2020 Last Visit: 08/28/2020 Next Visit: 11/26/2020 Last Refill:10/04/2020 #120 and 0RF  Please Advise

## 2020-11-01 ENCOUNTER — Other Ambulatory Visit: Payer: Self-pay | Admitting: Family Medicine

## 2020-11-01 ENCOUNTER — Encounter: Payer: Self-pay | Admitting: Family Medicine

## 2020-11-01 MED ORDER — OXYCODONE HCL 20 MG PO TABS
20.0000 mg | ORAL_TABLET | Freq: Three times a day (TID) | ORAL | 0 refills | Status: DC | PRN
Start: 2020-11-01 — End: 2020-11-21

## 2020-11-01 NOTE — Telephone Encounter (Signed)
Patient called to check the status of medicatiob

## 2020-11-19 ENCOUNTER — Other Ambulatory Visit: Payer: Self-pay

## 2020-11-21 ENCOUNTER — Encounter: Payer: Self-pay | Admitting: Family Medicine

## 2020-11-21 ENCOUNTER — Other Ambulatory Visit: Payer: Self-pay | Admitting: Family Medicine

## 2020-11-21 MED ORDER — OXYCODONE HCL 20 MG PO TABS: 20.0000 mg | ORAL_TABLET | Freq: Four times a day (QID) | ORAL | 0 refills | Status: DC | PRN

## 2020-11-26 ENCOUNTER — Telehealth: Payer: Medicaid Other | Admitting: Family Medicine

## 2020-11-28 ENCOUNTER — Telehealth (INDEPENDENT_AMBULATORY_CARE_PROVIDER_SITE_OTHER): Payer: PRIVATE HEALTH INSURANCE | Admitting: Family Medicine

## 2020-11-28 ENCOUNTER — Other Ambulatory Visit: Payer: Self-pay

## 2020-11-28 DIAGNOSIS — E039 Hypothyroidism, unspecified: Secondary | ICD-10-CM | POA: Diagnosis not present

## 2020-11-28 DIAGNOSIS — E785 Hyperlipidemia, unspecified: Secondary | ICD-10-CM

## 2020-11-28 DIAGNOSIS — R739 Hyperglycemia, unspecified: Secondary | ICD-10-CM

## 2020-11-28 DIAGNOSIS — G8929 Other chronic pain: Secondary | ICD-10-CM

## 2020-11-28 DIAGNOSIS — N951 Menopausal and female climacteric states: Secondary | ICD-10-CM

## 2020-11-28 DIAGNOSIS — I10 Essential (primary) hypertension: Secondary | ICD-10-CM

## 2020-11-28 DIAGNOSIS — M79604 Pain in right leg: Secondary | ICD-10-CM

## 2020-11-28 DIAGNOSIS — E213 Hyperparathyroidism, unspecified: Secondary | ICD-10-CM

## 2020-11-28 DIAGNOSIS — M545 Low back pain, unspecified: Secondary | ICD-10-CM

## 2020-11-28 DIAGNOSIS — Z124 Encounter for screening for malignant neoplasm of cervix: Secondary | ICD-10-CM

## 2020-11-28 DIAGNOSIS — E559 Vitamin D deficiency, unspecified: Secondary | ICD-10-CM

## 2020-11-28 NOTE — Assessment & Plan Note (Signed)
Encouraged heart healthy diet, increase exercise, avoid trans fats, consider a krill oil cap daily 

## 2020-11-28 NOTE — Assessment & Plan Note (Signed)
Supplement and monitor 

## 2020-11-28 NOTE — Assessment & Plan Note (Signed)
Is referred to OB/GYN for ongoing GYN care and pap

## 2020-11-28 NOTE — Assessment & Plan Note (Signed)
monitor and report any concerns, no changes to meds. Encouraged heart healthy diet such as the DASH diet and exercise as tolerated.

## 2020-11-28 NOTE — Assessment & Plan Note (Signed)
On Levothyroxine, continue to monitor 

## 2020-11-28 NOTE — Assessment & Plan Note (Signed)
She is referred to sports medicine for further consideration. Will continue current pain meds for now.

## 2020-11-28 NOTE — Progress Notes (Signed)
Virtual Visit via Video Note  I connected with [name] [name] on 11/28/20 at 11:00 AM EST by a video enabled telemedicine application and verified that I am speaking with the correct person using two identifiers.  Location: Patient: home, patient and provider in visit Provider: home   I discussed the limitations of evaluation and management by telemedicine and the availability of in person appointments. The patient expressed understanding and agreed to proceed. S Chism, CMA was able to get the patient set up on a video visit.    Subjective:    Patient ID: [name] [name], female    DOB: November 10, 1960, 60 y.o.   MRN: [id number]  Chief Complaint  Patient presents with  . Follow-up    HPI Patient is in today for follow up on chronic medical concerns. No recent recent febrile illness or hospitalizations. She continues to struggle with chronic low back pain but is managing with current meds. No recent falls or injury. No c/o polyuria or polydipsia. She is trying to stay active and maintain a heart healthy diet. Denies CP/palp/SOB/HA/congestion/fevers/GI or GU c/o. Taking meds as prescribed  Past Medical History:  Diagnosis Date  . Acute foot pain, right 01/13/2017  . Acute upper respiratory infections of unspecified site 06/12/2014  . Anxiety and depression    chronic  . Breast cancer screening 09/05/2016  . Cervical cancer screening 09/05/2016  . Chronic back pain   . Depression with anxiety 08/23/2008   Qualifier: Diagnosis of  By: Niel Hummer MD, Lorinda Creed   . Gout 05/05/2017  . History of colon polyps   . History of fibromyalgia    suspected   . Hypertension   . Insomnia   . Knee pain, right 06/24/2015  . Meningitis spinal   . Morbid obesity ([place])   . Ovarian cyst, bilateral   . Preventative health care 06/24/2015  . PTSD (post-traumatic stress disorder)   . Vitamin D deficiency 06/24/2015    Past Surgical History:  Procedure Laterality Date  . COLONOSCOPY    . OVARIAN CYST REMOVAL       Family History  Problem Relation Age of Onset  . Rheum arthritis Father   . Diabetes Father   . Congestive Heart Failure Father   . Hypertension Father   . Stroke Father   . Cancer Maternal Uncle 22       colon  . Hip fracture Maternal Grandmother   . COPD Maternal Grandfather 87       colon  . Colon cancer Maternal Grandfather   . Lymphoma Paternal Grandmother   . Colon cancer Mother   . Breast cancer Mother        benign  . Colon cancer Maternal Aunt   . Breast cancer Maternal Great-grandmother   . Breast cancer Cousin        negative stage 3  . ADD / ADHD Neg Hx   . Depression Neg Hx   . Alcohol abuse Neg Hx   . Drug abuse Neg Hx   . Esophageal cancer Neg Hx     Social History   Socioeconomic History  . Marital status: Divorced    Spouse name: Not on file  . Number of children: Not on file  . Years of education: Not on file  . Highest education level: Not on file  Occupational History  . Not on file  Tobacco Use  . Smoking status: Former Smoker    Quit date: 04/04/1989    Years since quitting: 31.6  .  Smokeless tobacco: Never Used  Vaping Use  . Vaping Use: Never used  Substance and Sexual Activity  . Alcohol use: Yes    Comment: occassionally- 2 glassses of wine 2x/month  . Drug use: No    Comment: take meds as prescribed  . Sexual activity: Not on file  Other Topics Concern  . Not on file  Social History Narrative  . Not on file   Social Determinants of Health   Financial Resource Strain: Not on file  Food Insecurity: Not on file  Transportation Needs: Not on file  Physical Activity: Not on file  Stress: Not on file  Social Connections: Not on file  Intimate Partner Violence: Not on file    Outpatient Medications Prior to Visit  Medication Sig Dispense Refill  . allopurinol (ZYLOPRIM) 100 MG tablet Take 1 tablet (100 mg total) by mouth daily. 30 tablet 6  . atorvastatin (LIPITOR) 10 MG tablet Take 1 tablet (10 mg total) by mouth daily.  90 tablet 1  . beclomethasone (BECONASE-AQ) 42 MCG/SPRAY nasal spray Place 1 spray into both nostrils 2 (two) times daily. Dose is for each nostril. 25 g 1  . colchicine 0.6 MG tablet 2 tabs po once then 1 tab po q 2 hours prn pain til pain gone, max of 6 tabs in 24 hours or intolerable diarrhea 6 tablet 2  . cyclobenzaprine (FLEXERIL) 10 MG tablet Take 0.5-1 tablets (5-10 mg total) by mouth 2 (two) times daily as needed for muscle spasms. 40 tablet 2  . fentaNYL (DURAGESIC) 25 MCG/HR Place 1 patch onto the skin every 3 (three) days. 10 patch 0  . FLUoxetine (PROZAC) 10 MG capsule Take 1 capsule (10 mg total) by mouth daily. 90 capsule 3  . lisinopril (ZESTRIL) 10 MG tablet Take 1 tablet (10 mg total) by mouth daily. 90 tablet 1  . metoprolol succinate (TOPROL-XL) 25 MG 24 hr tablet Take 1 tablet (25 mg total) by mouth daily. 90 tablet 1  . Oxycodone HCl 20 MG TABS Take 1 tablet (20 mg total) by mouth 4 (four) times daily as needed. 120 tablet 0  . methylPREDNISolone (MEDROL) 4 MG tablet 5 tab po qd X 1d then 4 tab po qd X 1d then 3 tab po qd X 1d then 2 tab po qd then 1 tab po qd 15 tablet 0  . Vitamin D, Ergocalciferol, (DRISDOL) 1.25 MG (50000 UNIT) CAPS capsule TAKE ONE CAPSULE BY MOUTH EVERY WEEK (Patient not taking: No sig reported) 12 capsule 0   No facility-administered medications prior to visit.    Allergies  Allergen Reactions  . Cymbalta [Duloxetine Hcl] Other (See Comments)    Headaches  . Fiorinal [Butalbital-Aspirin-Caffeine] Other (See Comments)    Mental Clarity.    Review of Systems  Constitutional: Positive for malaise/fatigue. Negative for fever.  HENT: Negative for congestion.   Eyes: Negative for blurred vision.  Respiratory: Negative for shortness of breath.   Cardiovascular: Negative for chest pain, palpitations and leg swelling.  Gastrointestinal: Negative for abdominal pain, blood in stool and nausea.  Genitourinary: Negative for dysuria and frequency.   Musculoskeletal: Positive for back pain and myalgias. Negative for falls.  Skin: Negative for rash.  Neurological: Negative for dizziness, loss of consciousness and headaches.  Endo/Heme/Allergies: Negative for environmental allergies.  Psychiatric/Behavioral: Negative for depression. The patient is nervous/anxious.        Objective:    Physical Exam Constitutional:      Appearance: Normal appearance. She is not  ill-appearing.  HENT:     Head: Normocephalic and atraumatic.     Right Ear: External ear normal.     Left Ear: External ear normal.     Nose: Nose normal.  Eyes:     General:        Right eye: No discharge.        Left eye: No discharge.  Pulmonary:     Effort: Pulmonary effort is normal.  Neurological:     Mental Status: She is alert and oriented to person, place, and time.  Psychiatric:        Behavior: Behavior normal.     There were no vitals taken for this visit. Wt Readings from Last 3 Encounters:  08/28/20 215 lb 6.4 oz (97.7 kg)  06/25/20 232 lb 12.8 oz (105.6 kg)  02/17/20 230 lb (104.3 kg)    Diabetic Foot Exam - Simple   No data filed    Lab Results  Component Value Date   WBC 6.2 08/22/2020   HGB 12.4 08/22/2020   HCT 36.7 08/22/2020   PLT 192 08/22/2020   GLUCOSE 105 (H) 08/22/2020   CHOL 153 08/22/2020   TRIG 123 08/22/2020   HDL 49 (L) 08/22/2020   LDLDIRECT 108.0 08/11/2019   LDLCALC 82 08/22/2020   ALT 16 08/22/2020   AST 20 08/22/2020   NA 139 08/22/2020   K 3.8 08/22/2020   CL 103 08/22/2020   CREATININE 0.48 (L) 08/22/2020   BUN 8 08/22/2020   CO2 29 08/22/2020   TSH 1.96 08/22/2020   HGBA1C 5.8 (H) 08/22/2020    Lab Results  Component Value Date   TSH 1.96 08/22/2020   Lab Results  Component Value Date   WBC 6.2 08/22/2020   HGB 12.4 08/22/2020   HCT 36.7 08/22/2020   MCV 94.6 08/22/2020   PLT 192 08/22/2020   Lab Results  Component Value Date   NA 139 08/22/2020   K 3.8 08/22/2020   CO2 29 08/22/2020    GLUCOSE 105 (H) 08/22/2020   BUN 8 08/22/2020   CREATININE 0.48 (L) 08/22/2020   BILITOT 0.5 08/22/2020   ALKPHOS 72 04/05/2020   AST 20 08/22/2020   ALT 16 08/22/2020   PROT 6.4 08/22/2020   ALBUMIN 4.0 04/05/2020   CALCIUM 10.8 (H) 08/22/2020   GFR 113.35 04/05/2020   Lab Results  Component Value Date   CHOL 153 08/22/2020   Lab Results  Component Value Date   HDL 49 (L) 08/22/2020   Lab Results  Component Value Date   LDLCALC 82 08/22/2020   Lab Results  Component Value Date   TRIG 123 08/22/2020   Lab Results  Component Value Date   CHOLHDL 3.1 08/22/2020   Lab Results  Component Value Date   HGBA1C 5.8 (H) 08/22/2020       Assessment & Plan:   Problem List Items Addressed This Visit    Hypothyroidism    On Levothyroxine, continue to monitor      Hyperlipidemia    Encouraged heart healthy diet, increase exercise, avoid trans fats, consider a krill oil cap daily      Relevant Orders   Lipid panel   Essential hypertension    monitor and report any concerns, no changes to meds. Encouraged heart healthy diet such as the DASH diet and exercise as tolerated.       Relevant Orders   CBC   Lipid panel   TSH   MENOPAUSE, EARLY    Is  referred to OB/GYN for ongoing GYN care and pap       Low back pain radiating to right lower extremity    She is referred to sports medicine for further consideration. Will continue current pain meds for now.       Hypercalcemia   Relevant Orders   Comprehensive metabolic panel   Vitamin D deficiency    Supplement and monitor      Cervical cancer screening   Relevant Orders   Ambulatory referral to Obstetrics / Gynecology   Hyperparathyroidism ([place])   Relevant Orders   PTH, intact (no Ca)   Hyperglycemia   Relevant Orders   Hemoglobin A1c    Other Visit Diagnoses    Chronic bilateral low back pain, unspecified whether sciatica present    -  Primary   Relevant Orders   Ambulatory referral to Sports Medicine    Sedimentation rate      I have discontinued Stanton Kidney C. Hammitt "Candace"'s Vitamin D (Ergocalciferol) and methylPREDNISolone. I am also having her maintain her beclomethasone, FLUoxetine, cyclobenzaprine, colchicine, lisinopril, atorvastatin, metoprolol succinate, fentaNYL, allopurinol, and Oxycodone HCl.  No orders of the defined types were placed in this encounter.    I discussed the assessment and treatment plan with the patient. The patient was provided an opportunity to ask questions and all were answered. The patient agreed with the plan and demonstrated an understanding of the instructions.   The patient was advised to call back or seek an in-person evaluation if the symptoms worsen or if the condition fails to improve as anticipated.  I provided 25 minutes of non-face-to-face time during this encounter.   Penni Homans, MD

## 2020-12-05 ENCOUNTER — Ambulatory Visit: Payer: PRIVATE HEALTH INSURANCE | Admitting: Family Medicine

## 2020-12-05 NOTE — Progress Notes (Deleted)
TOMEEKA PLAUGHER - 60 y.o. female MRN [id number]  Date of birth: [birth year]  SUBJECTIVE:  Including CC & ROS.  No chief complaint on file.   KHAMORA KARAN is a 60 y.o. female that is  ***.  ***   Review of Systems See HPI   HISTORY: Past Medical, Surgical, Social, and Family History Reviewed & Updated per EMR.   Pertinent Historical Findings include:  Past Medical History:  Diagnosis Date  . Acute foot pain, right 01/13/2017  . Acute upper respiratory infections of unspecified site 06/12/2014  . Anxiety and depression    chronic  . Breast cancer screening 09/05/2016  . Cervical cancer screening 09/05/2016  . Chronic back pain   . Depression with anxiety 08/23/2008   Qualifier: Diagnosis of  By: Niel Hummer MD, Lorinda Creed   . Gout 05/05/2017  . History of colon polyps   . History of fibromyalgia    suspected   . Hypertension   . Insomnia   . Knee pain, right 06/24/2015  . Meningitis spinal   . Morbid obesity ([place])   . Ovarian cyst, bilateral   . Preventative health care 06/24/2015  . PTSD (post-traumatic stress disorder)   . Vitamin D deficiency 06/24/2015    Past Surgical History:  Procedure Laterality Date  . COLONOSCOPY    . OVARIAN CYST REMOVAL      Family History  Problem Relation Age of Onset  . Rheum arthritis Father   . Diabetes Father   . Congestive Heart Failure Father   . Hypertension Father   . Stroke Father   . Cancer Maternal Uncle 35       colon  . Hip fracture Maternal Grandmother   . COPD Maternal Grandfather 87       colon  . Colon cancer Maternal Grandfather   . Lymphoma Paternal Grandmother   . Colon cancer Mother   . Breast cancer Mother        benign  . Colon cancer Maternal Aunt   . Breast cancer Maternal Great-grandmother   . Breast cancer Cousin        negative stage 3  . ADD / ADHD Neg Hx   . Depression Neg Hx   . Alcohol abuse Neg Hx   . Drug abuse Neg Hx   . Esophageal cancer Neg Hx     Social History   Socioeconomic History   . Marital status: Divorced    Spouse name: Not on file  . Number of children: Not on file  . Years of education: Not on file  . Highest education level: Not on file  Occupational History  . Not on file  Tobacco Use  . Smoking status: Former Smoker    Quit date: 04/04/1989    Years since quitting: 31.6  . Smokeless tobacco: Never Used  Vaping Use  . Vaping Use: Never used  Substance and Sexual Activity  . Alcohol use: Yes    Comment: occassionally- 2 glassses of wine 2x/month  . Drug use: No    Comment: take meds as prescribed  . Sexual activity: Not on file  Other Topics Concern  . Not on file  Social History Narrative  . Not on file   Social Determinants of Health   Financial Resource Strain: Not on file  Food Insecurity: Not on file  Transportation Needs: Not on file  Physical Activity: Not on file  Stress: Not on file  Social Connections: Not on file  Intimate Partner Violence: Not  on file     PHYSICAL EXAM:  VS: There were no vitals taken for this visit. Physical Exam Gen: NAD, alert, cooperative with exam, well-appearing MSK:  ***      ASSESSMENT & PLAN:   No problem-specific Assessment & Plan notes found for this encounter.

## 2020-12-12 ENCOUNTER — Other Ambulatory Visit: Payer: Self-pay

## 2020-12-12 ENCOUNTER — Encounter: Payer: Self-pay | Admitting: Family Medicine

## 2020-12-12 ENCOUNTER — Other Ambulatory Visit (INDEPENDENT_AMBULATORY_CARE_PROVIDER_SITE_OTHER): Payer: PRIVATE HEALTH INSURANCE

## 2020-12-12 ENCOUNTER — Ambulatory Visit (INDEPENDENT_AMBULATORY_CARE_PROVIDER_SITE_OTHER): Payer: PRIVATE HEALTH INSURANCE | Admitting: Family Medicine

## 2020-12-12 VITALS — BP 118/72 | Ht 65.0 in | Wt 210.0 lb

## 2020-12-12 DIAGNOSIS — M5416 Radiculopathy, lumbar region: Secondary | ICD-10-CM | POA: Diagnosis not present

## 2020-12-12 DIAGNOSIS — E785 Hyperlipidemia, unspecified: Secondary | ICD-10-CM | POA: Diagnosis not present

## 2020-12-12 DIAGNOSIS — M47817 Spondylosis without myelopathy or radiculopathy, lumbosacral region: Secondary | ICD-10-CM | POA: Insufficient documentation

## 2020-12-12 DIAGNOSIS — M545 Low back pain, unspecified: Secondary | ICD-10-CM

## 2020-12-12 DIAGNOSIS — G8929 Other chronic pain: Secondary | ICD-10-CM

## 2020-12-12 DIAGNOSIS — E213 Hyperparathyroidism, unspecified: Secondary | ICD-10-CM

## 2020-12-12 DIAGNOSIS — R739 Hyperglycemia, unspecified: Secondary | ICD-10-CM | POA: Diagnosis not present

## 2020-12-12 DIAGNOSIS — I10 Essential (primary) hypertension: Secondary | ICD-10-CM | POA: Diagnosis not present

## 2020-12-12 NOTE — Patient Instructions (Signed)
Nice to meet you Please try heat  Please try the exercises  Please try physical therapy  Please send me a message in MyChart with any questions or updates.  Please see me back in 4 weeks.   --Dr. Martie Muhlbauer  

## 2020-12-12 NOTE — Assessment & Plan Note (Signed)
Has changes in the lower aspect as well as retrolisthesis.  These seem to be chronic in nature. -Counseled on home exercise therapy and supportive care. -Referral to physical therapy. -Could consider MRI for facet injections.

## 2020-12-12 NOTE — Assessment & Plan Note (Addendum)
Has a component of radiculopathy.  Has had epidurals in the past.  She does have some degree of neuropathy that was reported from previous EMGs. -Counseled on home exercise therapy and supportive care. - referral to PT -Could consider further imaging

## 2020-12-12 NOTE — Progress Notes (Signed)
[name] [name] - 60 y.o. female MRN [id number]  Date of birth: [birth year]  SUBJECTIVE:  Including CC & ROS.  No chief complaint on file.   [name] [name] is a 60 y.o. female that is presenting with acute on chronic low back pain with radicular components.  She has had this pain for at least 10 years.  She is currently on oxycodone and fentanyl patch.  She denies any previous surgery.  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  Pain is intermittent in nature.  Reports having EMGs that confirm some aspect of neuropathy in her lower extremity..  Independent review of the lumbar spine x-ray from 2019 shows disc degeneration with multilevel spondylolisthesis and facet arthropathy.   Review of Systems See HPI   HISTORY: Past Medical, Surgical, Social, and Family History Reviewed & Updated per EMR.   Pertinent Historical Findings include:  Past Medical History:  Diagnosis Date  . Acute foot pain, right 01/13/2017  . Acute upper respiratory infections of unspecified site 06/12/2014  . Anxiety and depression    chronic  . Breast cancer screening 09/05/2016  . Cervical cancer screening 09/05/2016  . Chronic back pain   . Depression with anxiety 08/23/2008   Qualifier: Diagnosis of  By: Niel Hummer MD, Lorinda Creed   . Gout 05/05/2017  . History of colon polyps   . History of fibromyalgia    suspected   . Hypertension   . Insomnia   . Knee pain, right 06/24/2015  . Meningitis spinal   . Morbid obesity ([place])   . Ovarian cyst, bilateral   . Preventative health care 06/24/2015  . PTSD (post-traumatic stress disorder)   . Vitamin D deficiency 06/24/2015    Past Surgical History:  Procedure Laterality Date  . COLONOSCOPY    . OVARIAN CYST REMOVAL      Family History  Problem Relation Age of Onset  . Rheum arthritis Father   . Diabetes Father   . Congestive Heart Failure Father   . Hypertension Father   . Stroke Father   . Cancer Maternal Uncle 46       colon  . Hip fracture Maternal  Grandmother   . COPD Maternal Grandfather 87       colon  . Colon cancer Maternal Grandfather   . Lymphoma Paternal Grandmother   . Colon cancer Mother   . Breast cancer Mother        benign  . Colon cancer Maternal Aunt   . Breast cancer Maternal Great-grandmother   . Breast cancer Cousin        negative stage 3  . ADD / ADHD Neg Hx   . Depression Neg Hx   . Alcohol abuse Neg Hx   . Drug abuse Neg Hx   . Esophageal cancer Neg Hx     Social History   Socioeconomic History  . Marital status: Divorced    Spouse name: Not on file  . Number of children: Not on file  . Years of education: Not on file  . Highest education level: Not on file  Occupational History  . Not on file  Tobacco Use  . Smoking status: Former Smoker    Quit date: 04/04/1989    Years since quitting: 31.7  . Smokeless tobacco: Never Used  Vaping Use  . Vaping Use: Never used  Substance and Sexual Activity  . Alcohol use: Yes    Comment: occassionally- 2 glassses of wine 2x/month  . Drug use: No  Comment: take meds as prescribed  . Sexual activity: Not on file  Other Topics Concern  . Not on file  Social History Narrative  . Not on file   Social Determinants of Health   Financial Resource Strain: Not on file  Food Insecurity: Not on file  Transportation Needs: Not on file  Physical Activity: Not on file  Stress: Not on file  Social Connections: Not on file  Intimate Partner Violence: Not on file     PHYSICAL EXAM:  VS: BP 118/72 (BP Location: Left Arm, Patient Position: Sitting, Cuff Size: Large)   Ht 5\' 5"  (1.651 m)   Wt 210 lb (95.3 kg)   BMI 34.95 kg/m  Physical Exam Gen: NAD, alert, cooperative with exam, well-appearing MSK:  Back: Normal flexion. Limited extension. Instability more on the right compared to the left. Instability with hip flexion and abduction more on the right compared to the left. Negative straight leg raise. Neurovascular intact     ASSESSMENT & PLAN:    Lumbar radiculopathy Has a component of radiculopathy.  Has had epidurals in the past.  She does have some degree of neuropathy that was reported from previous EMGs. -Counseled on home exercise therapy and supportive care. - referral to PT -Could consider further imaging   Facet arthropathy, lumbosacral Has changes in the lower aspect as well as retrolisthesis.  These seem to be chronic in nature. -Counseled on home exercise therapy and supportive care. -Referral to physical therapy. -Could consider MRI for facet injections.

## 2020-12-13 LAB — COMPREHENSIVE METABOLIC PANEL
ALT: 14 U/L (ref 0–35)
AST: 17 U/L (ref 0–37)
Albumin: 4.2 g/dL (ref 3.5–5.2)
Alkaline Phosphatase: 66 U/L (ref 39–117)
BUN: 15 mg/dL (ref 6–23)
CO2: 28 mEq/L (ref 19–32)
Calcium: 10.3 mg/dL (ref 8.4–10.5)
Chloride: 101 mEq/L (ref 96–112)
Creatinine, Ser: 0.45 mg/dL (ref 0.40–1.20)
GFR: 105.51 mL/min (ref >60.00)
Glucose, Bld: 93 mg/dL (ref 70–99)
Potassium: 3.8 mEq/L (ref 3.5–5.1)
Sodium: 138 mEq/L (ref 135–145)
Total Bilirubin: 0.7 mg/dL (ref 0.2–1.2)
Total Protein: 6.7 g/dL (ref 6.0–8.3)

## 2020-12-13 LAB — CBC
HCT: 38.9 % (ref 36.0–46.0)
Hemoglobin: 13.2 g/dL (ref 12.0–15.0)
MCHC: 33.9 g/dL (ref 30.0–36.0)
MCV: 93.1 fl (ref 78.0–100.0)
Platelets: 182 10*3/uL (ref 150.0–400.0)
RBC: 4.18 Mil/uL (ref 3.87–5.11)
RDW: 14.7 % (ref 11.5–15.5)
WBC: 6.8 10*3/uL (ref 4.0–10.5)

## 2020-12-13 LAB — PARATHYROID HORMONE, INTACT (NO CA): PTH: 68 pg/mL (ref 16–77)

## 2020-12-13 LAB — LIPID PANEL
Cholesterol: 212 mg/dL — ABNORMAL HIGH (ref 0–200)
HDL: 76.7 mg/dL (ref >39.00)
LDL Cholesterol: 101 mg/dL — ABNORMAL HIGH (ref 0–99)
NonHDL: 135.24
Total CHOL/HDL Ratio: 3
Triglycerides: 172 mg/dL — ABNORMAL HIGH (ref 0.0–149.0)
VLDL: 34.4 mg/dL (ref 0.0–40.0)

## 2020-12-13 LAB — TSH: TSH: 3.23 u[IU]/mL (ref 0.35–4.50)

## 2020-12-13 LAB — HEMOGLOBIN A1C: Hgb A1c MFr Bld: 5.5 % (ref 4.6–6.5)

## 2020-12-13 LAB — SEDIMENTATION RATE: Sed Rate: 7 mm/hr (ref 0–30)

## 2020-12-16 ENCOUNTER — Other Ambulatory Visit: Payer: Self-pay | Admitting: Family Medicine

## 2020-12-17 MED ORDER — FENTANYL 25 MCG/HR TD PT72: 1.0000 | MEDICATED_PATCH | TRANSDERMAL | 0 refills | Status: DC

## 2020-12-17 MED ORDER — OXYCODONE HCL 20 MG PO TABS: 20.0000 mg | ORAL_TABLET | Freq: Four times a day (QID) | ORAL | 0 refills | Status: DC | PRN

## 2020-12-17 NOTE — Telephone Encounter (Signed)
Requesting: Fentanyl and oxycodone Contract: 08/30/2019 UDS: 04/05/2020 Last Visit: 11/28/2020 Next Visit: 03/04/2021 Last Refill on Fentanyl: 10/12/2020 #10 and 0rf Last Refill on oxycodone: 11/21/2020 #120 and 0RF  Please Advise

## 2021-01-09 ENCOUNTER — Other Ambulatory Visit: Payer: Self-pay | Admitting: Family Medicine

## 2021-01-10 ENCOUNTER — Encounter: Payer: Self-pay | Admitting: Family Medicine

## 2021-01-11 ENCOUNTER — Other Ambulatory Visit: Payer: Self-pay | Admitting: Family Medicine

## 2021-01-14 ENCOUNTER — Telehealth: Payer: Self-pay

## 2021-01-14 ENCOUNTER — Other Ambulatory Visit: Payer: Self-pay | Admitting: Family Medicine

## 2021-01-14 MED ORDER — METHYLPREDNISOLONE 4 MG PO TABS: ORAL_TABLET | ORAL | 0 refills | Status: DC

## 2021-01-14 MED ORDER — FENTANYL 25 MCG/HR TD PT72: 1.0000 | MEDICATED_PATCH | TRANSDERMAL | 0 refills | Status: DC

## 2021-01-14 MED ORDER — OXYCODONE HCL 20 MG PO TABS: 20.0000 mg | ORAL_TABLET | Freq: Four times a day (QID) | ORAL | 0 refills | Status: DC | PRN

## 2021-01-14 MED ORDER — COLCHICINE 0.6 MG PO TABS: ORAL_TABLET | ORAL | 2 refills | Status: DC

## 2021-01-14 NOTE — Telephone Encounter (Signed)
Chief Complaint Toe Pain Reason for Call Symptomatic / Request for Health Information Initial Comment Caller states, pt is on rx for goute. Pt has a steak and long walk on Wed. Pt having worsening toe pain. Wanting refill and steroids called in. Translation No Nurse Assessment Nurse: Cherre Robins, RN, Ria Comment Date/Time (Eastern Time): 01/11/2021 10:48:17 AM Confirm and document reason for call. If symptomatic, describe symptoms. ---Caller states she requesting refill on gout rx colchicine- last filled in January and requesting rx for PO steroid. Already on allopurinol daily. States her right foot big toe is painful and has been worsening since yesterday. allopurinol. No fever. Does the patient have any new or worsening symptoms? ---Yes Will a triage be completed? ---Yes Related visit to physician within the last 2 weeks? ---No Does the PT have any chronic conditions? (i.e. diabetes, asthma, this includes High risk factors for pregnancy, etc.) ---Yes List chronic conditions. ---gout, back pain.

## 2021-01-14 NOTE — Telephone Encounter (Signed)
Dr.blyth made pt aware via mychart

## 2021-01-14 NOTE — Telephone Encounter (Addendum)
Initial Comment Caller states, pt is on rx for goute. Pt has a steak and long walk on Wed. Pt having worsening toe pain. Wanting refill and steroids called in.   ---Caller states she requesting refill on gout rx colchicine- last filled in January and requesting rx for PO steroid. Already on allopurinol daily. States her right foot big toe is painful and has been worsening since yesterday. allopurinol. No fever.

## 2021-01-14 NOTE — Telephone Encounter (Signed)
I sent in a refill on Colchicine and a medrol dosepak to [facility], if no improvement she will need to be seen

## 2021-01-14 NOTE — Telephone Encounter (Signed)
Requesting: fentanyl 11mcg/hr and oxycodone 20mg  Contract: 08/30/2019 UDS: 04/05/2020 Last Visit: 11/28/20 Next Visit: 03/04/2021 Last Refill on fentanyl: 12/17/2020 #10 and 0RF Last Refill on oxycodone: 12/17/2020 #120 and 0RF  Please Advise

## 2021-01-16 ENCOUNTER — Other Ambulatory Visit: Payer: Self-pay

## 2021-01-16 ENCOUNTER — Telehealth: Payer: Self-pay

## 2021-01-16 NOTE — Telephone Encounter (Signed)
Started a PA for fentaNYL (DURAGESIC) 25 MCG/HR

## 2021-01-23 ENCOUNTER — Ambulatory Visit: Payer: PRIVATE HEALTH INSURANCE | Admitting: Physical Therapy

## 2021-01-23 NOTE — Telephone Encounter (Signed)
Prior auth approved  Key: [id number]  Medication covered 10 patches every 30 days.

## 2021-02-01 ENCOUNTER — Other Ambulatory Visit: Payer: Self-pay | Admitting: Family Medicine

## 2021-02-04 NOTE — Telephone Encounter (Signed)
Requesting:oxycodone HCI 20 mg Contract:08/11/19 UDS:04/05/20 Last Visit:11/28/20 Next Visit:03/04/21 Last Refill:  Please Advise    Requesting:fentanyl 25 mcg/HR Contract:08/11/19 UDS:04/05/20 Last Visit:11/28/20 Next Visit:03/04/21 Last Refill:  Please Advise

## 2021-02-05 MED ORDER — FENTANYL 25 MCG/HR TD PT72: 1.0000 | MEDICATED_PATCH | TRANSDERMAL | 0 refills | Status: DC

## 2021-02-05 MED ORDER — OXYCODONE HCL 20 MG PO TABS: 20.0000 mg | ORAL_TABLET | Freq: Four times a day (QID) | ORAL | 0 refills | Status: DC | PRN

## 2021-03-04 ENCOUNTER — Other Ambulatory Visit: Payer: Self-pay

## 2021-03-04 ENCOUNTER — Encounter: Payer: Self-pay | Admitting: *Deleted

## 2021-03-04 ENCOUNTER — Encounter: Payer: Self-pay | Admitting: Family Medicine

## 2021-03-04 ENCOUNTER — Ambulatory Visit (INDEPENDENT_AMBULATORY_CARE_PROVIDER_SITE_OTHER): Payer: No Typology Code available for payment source | Admitting: Family Medicine

## 2021-03-04 VITALS — BP 136/78 | HR 78 | Temp 98.5°F | Resp 16 | Wt 217.0 lb

## 2021-03-04 DIAGNOSIS — Z79899 Other long term (current) drug therapy: Secondary | ICD-10-CM | POA: Diagnosis not present

## 2021-03-04 DIAGNOSIS — E039 Hypothyroidism, unspecified: Secondary | ICD-10-CM

## 2021-03-04 DIAGNOSIS — I10 Essential (primary) hypertension: Secondary | ICD-10-CM

## 2021-03-04 DIAGNOSIS — E785 Hyperlipidemia, unspecified: Secondary | ICD-10-CM | POA: Diagnosis not present

## 2021-03-04 DIAGNOSIS — R739 Hyperglycemia, unspecified: Secondary | ICD-10-CM | POA: Diagnosis not present

## 2021-03-04 DIAGNOSIS — M5416 Radiculopathy, lumbar region: Secondary | ICD-10-CM

## 2021-03-04 DIAGNOSIS — F418 Other specified anxiety disorders: Secondary | ICD-10-CM

## 2021-03-04 DIAGNOSIS — G4700 Insomnia, unspecified: Secondary | ICD-10-CM

## 2021-03-04 DIAGNOSIS — G8929 Other chronic pain: Secondary | ICD-10-CM

## 2021-03-04 DIAGNOSIS — M545 Low back pain, unspecified: Secondary | ICD-10-CM

## 2021-03-04 DIAGNOSIS — M1A9XX0 Chronic gout, unspecified, without tophus (tophi): Secondary | ICD-10-CM

## 2021-03-04 DIAGNOSIS — E559 Vitamin D deficiency, unspecified: Secondary | ICD-10-CM

## 2021-03-04 MED ORDER — TRAZODONE HCL 50 MG PO TABS: 25.0000 mg | ORAL_TABLET | Freq: Every evening | ORAL | 3 refills | Status: DC | PRN

## 2021-03-04 MED ORDER — FLUOXETINE HCL 10 MG PO CAPS: 10.0000 mg | ORAL_CAPSULE | Freq: Every day | ORAL | 3 refills | Status: DC

## 2021-03-04 MED ORDER — ALPRAZOLAM 0.25 MG PO TABS
0.2500 mg | ORAL_TABLET | Freq: Two times a day (BID) | ORAL | 1 refills | Status: DC | PRN
Start: 2021-03-04 — End: 2023-12-30

## 2021-03-04 NOTE — Assessment & Plan Note (Signed)
Hydrate and monitor, had a recent attack in [place] right [place] toe [place] after eating a small amount of red meat. Responded to Colchicine and Medrol

## 2021-03-04 NOTE — Assessment & Plan Note (Signed)
Encouraged heart healthy diet, increase exercise, avoid trans fats, consider a krill oil cap daily. Atorvastatin is tolerated

## 2021-03-04 NOTE — Patient Instructions (Signed)
Varicose Veins Varicose veins are veins that have become enlarged, bulged, and twisted. They most often appear in the legs. What are the causes? This condition is caused by damage to the valves in the vein. These valves help blood return to your heart. When they are damaged and they stop working properly, blood may flow backward and back up in the veins near the skin, causing the veins to get larger and appear twisted. The condition can result from any issue that causes blood to back up, like pregnancy, prolonged standing, or obesity. What increases the risk? This condition is more likely to develop in people who are:  On their feet a lot.  Pregnant.  Overweight. What are the signs or symptoms? Symptoms of this condition include:  Bulging, twisted, and bluish veins.  A feeling of heaviness. This may be worse at the end of the day.  Leg pain. This may be worse at the end of the day.  Swelling in the leg.  Changes in skin color over the veins. How is this diagnosed? This condition may be diagnosed based on your symptoms, a physical exam, and an ultrasound test. How is this treated? Treatment for this condition may involve:  Avoiding sitting or standing in one position for long periods of time.  Wearing compression stockings. These stockings help to prevent blood clots and reduce swelling in the legs.  Raising (elevating) the legs when resting.  Losing weight.  Exercising regularly. If you have persistent symptoms or want to improve the way your varicose veins look, you may choose to have a procedure to close the varicose veins off or to remove them. Treatments to close off the veins include:  Sclerotherapy. In this treatment, a solution is injected into a vein to close it off.  Laser treatment. In this treatment, the vein is heated with a laser to close it off.  Radiofrequency vein ablation. In this treatment, an electrical current produced by radio waves is used to close  off the vein. Treatments to remove the veins include:  Phlebectomy. In this treatment, the veins are removed through small incisions made over the veins.  Vein ligation and stripping. In this treatment, incisions are made over the veins. The veins are then removed after being tied (ligated) with stitches (sutures). Follow these instructions at home: Activity  Walk as much as possible. Walking increases blood flow. This helps blood return to the heart and takes pressure off your veins. It also increases your cardiovascular strength.  Follow your health care provider's instructions about exercising.  Do not stand or sit in one position for a long period of time.  Do not sit with your legs crossed.  Rest with your legs raised during the day. General instructions  Follow any diet instructions given to you by your health care provider.  Wear compression stockings as directed by your health care provider. Do not wear other kinds of tight clothing around your legs, pelvis, or waist.  Elevate your legs at night to above the level of your heart.  If you get a cut in the skin over the varicose vein and the vein bleeds: ? Lie down with your leg raised. ? Apply firm pressure to the cut with a clean cloth until the bleeding stops. ? Place a bandage (dressing) on the cut.   Contact a health care provider if:  The skin around your varicose veins starts to break down.  You have pain, redness, tenderness, or hard swelling over a vein.  You are uncomfortable because of pain.  You get a cut in the skin over a varicose vein and it will not stop bleeding. Summary  Varicose veins are veins that have become enlarged, bulged, and twisted. They most often appear in the legs.  This condition is caused by damage to the valves in the vein. These valves help blood return to your heart.  Treatment for this condition includes frequent movements, wearing compression stockings, losing weight, and  exercising regularly. In some cases, procedures are done to close off or remove the veins.  Treatment for this condition may include wearing compression stockings, elevating the legs, losing weight, and engaging in regular activity. In some cases, procedures are done to close off or remove the veins. This information is not intended to replace advice given to you by your health care provider. Make sure you discuss any questions you have with your health care provider. Document Revised: 01/26/2020 Document Reviewed: 01/26/2020 Elsevier Patient Education  [employer].

## 2021-03-04 NOTE — Assessment & Plan Note (Addendum)
Working with [facility] and is considering Physical Therapy but she is having trouble with her insurance coverage, continue current medicines

## 2021-03-04 NOTE — Assessment & Plan Note (Signed)
hgba1c acceptable, minimize simple carbs. Increase exercise as tolerated.  

## 2021-03-04 NOTE — Assessment & Plan Note (Signed)
Well controlled, no changes to meds. Encouraged heart healthy diet such as the DASH diet and exercise as tolerated.  °

## 2021-03-06 LAB — DRUG MONITORING, PANEL 8 WITH CONFIRMATION, URINE
6 Acetylmorphine: NEGATIVE ng/mL (ref <10)
Alcohol Metabolites: NEGATIVE ng/mL
Amphetamines: NEGATIVE ng/mL (ref <500)
Benzodiazepines: NEGATIVE ng/mL (ref <100)
Buprenorphine, Urine: NEGATIVE ng/mL (ref <5)
Cocaine Metabolite: NEGATIVE ng/mL (ref <150)
Codeine: NEGATIVE ng/mL (ref <50)
Creatinine: 43.9 mg/dL
Hydrocodone: NEGATIVE ng/mL (ref <50)
Hydromorphone: NEGATIVE ng/mL (ref <50)
MDMA: NEGATIVE ng/mL (ref <500)
Marijuana Metabolite: NEGATIVE ng/mL (ref <20)
Morphine: NEGATIVE ng/mL (ref <50)
Norhydrocodone: 278 ng/mL — ABNORMAL HIGH (ref <50)
Noroxycodone: >10000 ng/mL — ABNORMAL HIGH (ref <50)
Opiates: POSITIVE ng/mL — AB (ref <100)
Oxidant: NEGATIVE ug/mL
Oxycodone: 929 ng/mL — ABNORMAL HIGH (ref <50)
Oxycodone: POSITIVE ng/mL — AB (ref <100)
Oxymorphone: 918 ng/mL — ABNORMAL HIGH (ref <50)
pH: 6.9 (ref 4.5–9.0)

## 2021-03-06 LAB — DM TEMPLATE

## 2021-03-08 ENCOUNTER — Other Ambulatory Visit: Payer: Self-pay | Admitting: Family Medicine

## 2021-03-08 MED ORDER — FENTANYL 25 MCG/HR TD PT72: 1.0000 | MEDICATED_PATCH | TRANSDERMAL | 0 refills | Status: DC

## 2021-03-08 MED ORDER — OXYCODONE HCL 20 MG PO TABS: 20.0000 mg | ORAL_TABLET | Freq: Four times a day (QID) | ORAL | 0 refills | Status: DC | PRN

## 2021-03-08 NOTE — Telephone Encounter (Signed)
Requesting: Fentanyl Contract: 03/04/2021 UDS: 03/04/2021 Last Visit: 03/04/2021 Next Visit: 06/13/2021 Last Refill: 02/05/2021     Requesting: Oxycodone Contract: 03/04/2021 UDS: 03/04/2021 Last Visit: 03/04/2021 Next Visit: 06/13/2021 Last Refill: 02/05/2021

## 2021-03-09 NOTE — Assessment & Plan Note (Signed)
Fluoxetine 10 mg po daily

## 2021-03-09 NOTE — Assessment & Plan Note (Signed)
Encouraged good sleep hygiene such as dark, quiet room. No blue/green glowing lights such as computer screens in bedroom. No alcohol or stimulants in evening. Cut down on caffeine as able. Regular exercise is helpful but not just prior to bed time. Prescription for Trazadone given

## 2021-03-09 NOTE — Assessment & Plan Note (Signed)
Supplement and monitor 

## 2021-03-09 NOTE — Assessment & Plan Note (Signed)
On Levothyroxine, continue to monitor 

## 2021-03-09 NOTE — Progress Notes (Signed)
Subjective:    Patient ID: [name] [name], female    DOB: [birth year], 60 y.o.   MRN: [id number]  Chief Complaint  Patient presents with   Follow-up    Pt would like medication for anxiety and sleep.   Hypothyroidism   Hyperlipidemia    HPI Patient is in today for follow up on chronic medical concerns. No recent febrile illness or hospitalizations. She continues to struggle with daily back pain but she is managing with staying busy and current medications. She is trying to maintain a heart healthy diet and she denies any recent new concerns. She continues to struggle with insomnia and anhedonia but no suicidal ideation. Denies CP/palp/SOB/HA/congestion/fevers/GI or GU c/o. Taking meds as prescribed   Past Medical History:  Diagnosis Date   Acute foot pain, right 01/13/2017   Acute upper respiratory infections of unspecified site 06/12/2014   Anxiety and depression    chronic   Breast cancer screening 09/05/2016   Cervical cancer screening 09/05/2016   Chronic back pain    Depression with anxiety 08/23/2008   Qualifier: Diagnosis of  By: Niel Hummer MD, Lorinda Creed    Gout 05/05/2017   History of colon polyps    History of fibromyalgia    suspected    Hypertension    Insomnia    Knee pain, right 06/24/2015   Meningitis spinal    Morbid obesity ([place])    Ovarian cyst, bilateral    Preventative health care 06/24/2015   PTSD (post-traumatic stress disorder)    Vitamin D deficiency 06/24/2015    Past Surgical History:  Procedure Laterality Date   COLONOSCOPY     OVARIAN CYST REMOVAL      Family History  Problem Relation Age of Onset   Rheum arthritis Father    Diabetes Father    Congestive Heart Failure Father    Hypertension Father    Stroke Father    Cancer Maternal Uncle 17       colon   Hip fracture Maternal Grandmother    COPD Maternal Grandfather 87       colon   Colon cancer Maternal Grandfather    Lymphoma Paternal Grandmother    Colon cancer Mother    Breast  cancer Mother        benign   Colon cancer Maternal Aunt    Breast cancer Maternal Great-grandmother    Breast cancer Cousin        negative stage 3   ADD / ADHD Neg Hx    Depression Neg Hx    Alcohol abuse Neg Hx    Drug abuse Neg Hx    Esophageal cancer Neg Hx     Social History   Socioeconomic History   Marital status: Divorced    Spouse name: Not on file   Number of children: Not on file   Years of education: Not on file   Highest education level: Not on file  Occupational History   Not on file  Tobacco Use   Smoking status: Former    Pack years: 0.00    Types: Cigarettes    Quit date: 04/04/1989    Years since quitting: 31.9   Smokeless tobacco: Never  Vaping Use   Vaping Use: Never used  Substance and Sexual Activity   Alcohol use: Yes    Comment: occassionally- 2 glassses of wine 2x/month   Drug use: No    Comment: take meds as prescribed   Sexual activity: Not on file  Other  Topics Concern   Not on file  Social History Narrative   Not on file   Social Determinants of Health   Financial Resource Strain: Not on file  Food Insecurity: Not on file  Transportation Needs: Not on file  Physical Activity: Not on file  Stress: Not on file  Social Connections: Not on file  Intimate Partner Violence: Not on file    Outpatient Medications Prior to Visit  Medication Sig Dispense Refill   allopurinol (ZYLOPRIM) 100 MG tablet Take 1 tablet (100 mg total) by mouth daily. 30 tablet 6   atorvastatin (LIPITOR) 10 MG tablet Take 1 tablet (10 mg total) by mouth daily. 90 tablet 1   beclomethasone (BECONASE-AQ) 42 MCG/SPRAY nasal spray Place 1 spray into both nostrils 2 (two) times daily. Dose is for each nostril. 25 g 1   colchicine 0.6 MG tablet 2 tabs po once then 1 tab po q 2 hours prn pain til pain gone, max of 6 tabs in 24 hours or intolerable diarrhea 6 tablet 2   cyclobenzaprine (FLEXERIL) 10 MG tablet Take 0.5-1 tablets (5-10 mg total) by mouth 2 (two) times daily  as needed for muscle spasms. 40 tablet 2   lisinopril (ZESTRIL) 10 MG tablet Take 1 tablet (10 mg total) by mouth daily. 90 tablet 1   metoprolol succinate (TOPROL-XL) 25 MG 24 hr tablet Take 1 tablet (25 mg total) by mouth daily. 90 tablet 1   fentaNYL (DURAGESIC) 25 MCG/HR Place 1 patch onto the skin every 3 (three) days. 10 patch 0   FLUoxetine (PROZAC) 10 MG capsule Take 1 capsule (10 mg total) by mouth daily. 90 capsule 3   methylPREDNISolone (MEDROL) 4 MG tablet 5 tab po qd X 1d then 4 tab po qd X 1d then 3 tab po qd X 1d then 2 tab po qd then 1 tab po qd 15 tablet 0   Oxycodone HCl 20 MG TABS Take 1 tablet (20 mg total) by mouth 4 (four) times daily as needed. 120 tablet 0   No facility-administered medications prior to visit.    Allergies  Allergen Reactions   Cymbalta [Duloxetine Hcl] Other (See Comments)    Headaches   Fiorinal [Butalbital-Aspirin-Caffeine] Other (See Comments)    Mental Clarity.    Review of Systems  Constitutional:  Negative for fever and malaise/fatigue.  HENT:  Negative for congestion.   Eyes:  Negative for blurred vision.  Respiratory:  Negative for shortness of breath.   Cardiovascular:  Negative for chest pain, palpitations and leg swelling.  Gastrointestinal:  Negative for abdominal pain, blood in stool and nausea.  Genitourinary:  Negative for dysuria and frequency.  Musculoskeletal:  Negative for falls.  Skin:  Negative for rash.  Neurological:  Negative for dizziness, loss of consciousness and headaches.  Endo/Heme/Allergies:  Negative for environmental allergies.  Psychiatric/Behavioral:  Negative for depression. The patient is not nervous/anxious.       Objective:    Physical Exam Vitals and nursing note reviewed.  Constitutional:      General: She is not in acute distress.    Appearance: She is well-developed.  HENT:     Head: Normocephalic and atraumatic.     Nose: Nose normal.  Eyes:     General:        Right eye: No discharge.         Left eye: No discharge.  Cardiovascular:     Rate and Rhythm: Normal rate and regular rhythm.  Heart sounds: No murmur heard. Pulmonary:     Effort: Pulmonary effort is normal.     Breath sounds: Normal breath sounds.  Abdominal:     General: Bowel sounds are normal.     Palpations: Abdomen is soft.     Tenderness: There is no abdominal tenderness.  Musculoskeletal:     Cervical back: Normal range of motion and neck supple.  Skin:    General: Skin is warm and dry.  Neurological:     Mental Status: She is alert and oriented to person, place, and time.    BP 136/78   Pulse 78   Temp 98.5 F (36.9 C)   Resp 16   Wt 217 lb (98.4 kg)   SpO2 94%   BMI 36.11 kg/m  Wt Readings from Last 3 Encounters:  03/04/21 217 lb (98.4 kg)  12/12/20 210 lb (95.3 kg)  08/28/20 215 lb 6.4 oz (97.7 kg)    Diabetic Foot Exam - Simple   No data filed    Lab Results  Component Value Date   WBC 6.8 12/12/2020   HGB 13.2 12/12/2020   HCT 38.9 12/12/2020   PLT 182.0 12/12/2020   GLUCOSE 93 12/12/2020   CHOL 212 (H) 12/12/2020   TRIG 172.0 (H) 12/12/2020   HDL 76.70 12/12/2020   LDLDIRECT 108.0 08/11/2019   LDLCALC 101 (H) 12/12/2020   ALT 14 12/12/2020   AST 17 12/12/2020   NA 138 12/12/2020   K 3.8 12/12/2020   CL 101 12/12/2020   CREATININE 0.45 12/12/2020   BUN 15 12/12/2020   CO2 28 12/12/2020   TSH 3.23 12/12/2020   HGBA1C 5.5 12/12/2020    Lab Results  Component Value Date   TSH 3.23 12/12/2020   Lab Results  Component Value Date   WBC 6.8 12/12/2020   HGB 13.2 12/12/2020   HCT 38.9 12/12/2020   MCV 93.1 12/12/2020   PLT 182.0 12/12/2020   Lab Results  Component Value Date   NA 138 12/12/2020   K 3.8 12/12/2020   CO2 28 12/12/2020   GLUCOSE 93 12/12/2020   BUN 15 12/12/2020   CREATININE 0.45 12/12/2020   BILITOT 0.7 12/12/2020   ALKPHOS 66 12/12/2020   AST 17 12/12/2020   ALT 14 12/12/2020   PROT 6.7 12/12/2020   ALBUMIN 4.2 12/12/2020    CALCIUM 10.3 12/12/2020   GFR 105.51 12/12/2020   Lab Results  Component Value Date   CHOL 212 (H) 12/12/2020   Lab Results  Component Value Date   HDL 76.70 12/12/2020   Lab Results  Component Value Date   LDLCALC 101 (H) 12/12/2020   Lab Results  Component Value Date   TRIG 172.0 (H) 12/12/2020   Lab Results  Component Value Date   CHOLHDL 3 12/12/2020   Lab Results  Component Value Date   HGBA1C 5.5 12/12/2020       Assessment & Plan:   Problem List Items Addressed This Visit     Hypothyroidism    On Levothyroxine, continue to monitor       Hyperlipidemia    Encouraged heart healthy diet, increase exercise, avoid trans fats, consider a krill oil cap daily. Atorvastatin is tolerated       Depression with anxiety    Fluoxetine 10 mg po daily       Relevant Medications   traZODone (DESYREL) 50 MG tablet   FLUoxetine (PROZAC) 10 MG capsule   ALPRAZolam (XANAX) 0.25 MG tablet   Essential hypertension  Well controlled, no changes to meds. Encouraged heart healthy diet such as the DASH diet and exercise as tolerated.        Lumbar radiculopathy    Working with [facility] and is considering Physical Therapy but she is having trouble with her insurance coverage, continue current medicines       Relevant Medications   traZODone (DESYREL) 50 MG tablet   FLUoxetine (PROZAC) 10 MG capsule   ALPRAZolam (XANAX) 0.25 MG tablet   Insomnia    Encouraged good sleep hygiene such as dark, quiet room. No blue/green glowing lights such as computer screens in bedroom. No alcohol or stimulants in evening. Cut down on caffeine as able. Regular exercise is helpful but not just prior to bed time. Prescription for Trazadone given       Vitamin D deficiency    Supplement and monitor       Gout    Hydrate and monitor, had a recent attack in her right big toe [place] after eating a small amount of red meat. Responded to Colchicine and Medrol        Hyperglycemia    hgba1c acceptable, minimize simple carbs. Increase exercise as tolerated.        Other Visit Diagnoses     High risk medication use    -  Primary   Relevant Orders   DRUG MONITORING, PANEL 8 WITH CONFIRMATION, URINE (Completed)   Chronic bilateral low back pain, unspecified whether sciatica present       Relevant Orders   DRUG MONITORING, PANEL 8 WITH CONFIRMATION, URINE (Completed)       I have discontinued Keyly C. Burel "Candace"'s methylPREDNISolone. I am also having her start on traZODone and ALPRAZolam. Additionally, I am having her maintain her beclomethasone, lisinopril, atorvastatin, metoprolol succinate, allopurinol, cyclobenzaprine, colchicine, and FLUoxetine.  Meds ordered this encounter  Medications   traZODone (DESYREL) 50 MG tablet    Sig: Take 0.5-1 tablets (25-50 mg total) by mouth at bedtime as needed for sleep.    Dispense:  30 tablet    Refill:  3   FLUoxetine (PROZAC) 10 MG capsule    Sig: Take 1 capsule (10 mg total) by mouth daily.    Dispense:  30 capsule    Refill:  3   ALPRAZolam (XANAX) 0.25 MG tablet    Sig: Take 1 tablet (0.25 mg total) by mouth 2 (two) times daily as needed for anxiety.    Dispense:  5 tablet    Refill:  1     Penni Homans, MD

## 2021-04-03 ENCOUNTER — Encounter: Payer: Self-pay | Admitting: Family Medicine

## 2021-04-04 ENCOUNTER — Other Ambulatory Visit: Payer: Self-pay

## 2021-04-04 MED ORDER — TRAZODONE HCL 100 MG PO TABS: 100.0000 mg | ORAL_TABLET | Freq: Every evening | ORAL | 1 refills | Status: DC | PRN

## 2021-04-04 NOTE — Telephone Encounter (Signed)
Pt states that she will try the 100mg  tabs and medication was sent

## 2021-04-05 ENCOUNTER — Other Ambulatory Visit: Payer: Self-pay | Admitting: Family Medicine

## 2021-04-05 NOTE — Telephone Encounter (Signed)
Requesting: Fentanyl and oxycodone  Contract: 03/04/2021 UDS: 03/04/2021 Last Visit: 03/04/2021 Next Visit: 06/13/2021 Last Refill on Fentanyl: 03/08/2021 #10 patch and 0RF Last Refill: oxycodone: 03/08/2021 #120 and 0RF  Please Advise

## 2021-04-06 ENCOUNTER — Other Ambulatory Visit: Payer: Self-pay | Admitting: Family Medicine

## 2021-04-07 MED ORDER — FENTANYL 25 MCG/HR TD PT72: 1.0000 | MEDICATED_PATCH | TRANSDERMAL | 0 refills | Status: DC

## 2021-04-07 MED ORDER — OXYCODONE HCL 20 MG PO TABS: 20.0000 mg | ORAL_TABLET | Freq: Four times a day (QID) | ORAL | 0 refills | Status: DC | PRN

## 2021-04-16 ENCOUNTER — Other Ambulatory Visit: Payer: Self-pay | Admitting: Family Medicine

## 2021-05-05 ENCOUNTER — Other Ambulatory Visit: Payer: Self-pay | Admitting: Family Medicine

## 2021-05-06 MED ORDER — FENTANYL 25 MCG/HR TD PT72: 1.0000 | MEDICATED_PATCH | TRANSDERMAL | 0 refills | Status: DC

## 2021-05-06 MED ORDER — OXYCODONE HCL 20 MG PO TABS: 20.0000 mg | ORAL_TABLET | Freq: Four times a day (QID) | ORAL | 0 refills | Status: DC | PRN

## 2021-05-06 NOTE — Telephone Encounter (Signed)
Requesting: oxycodone '20mg'$  and fentanyl 77mg/hr Contract: 03/04/2021 UDS: 03/04/2021 Last Visit: 03/04/2021 Next Visit: 06/13/2021 Last Refill on oxycodone '20mg'$ : 04/07/2021 #120 and 0RF Last Refill on fentanyl: 04/07/2021 #10 and 0RF  Please Advise

## 2021-05-08 ENCOUNTER — Telehealth: Payer: Self-pay | Admitting: *Deleted

## 2021-05-08 NOTE — Telephone Encounter (Signed)
Approved. Approved for OXYCODONE HCL Tablet, quantity up to 120 per 30 days, under the pharmacy benefit. The drug has been approved from 05/08/2021 to 08/08/2021.

## 2021-05-08 NOTE — Telephone Encounter (Signed)
Prior auth started via cover my meds.  Awaiting determination.  Key: [id number]

## 2021-05-09 ENCOUNTER — Other Ambulatory Visit: Payer: Self-pay | Admitting: Family Medicine

## 2021-05-30 ENCOUNTER — Other Ambulatory Visit: Payer: Self-pay | Admitting: Family Medicine

## 2021-05-31 ENCOUNTER — Other Ambulatory Visit: Payer: Self-pay | Admitting: Family Medicine

## 2021-05-31 MED ORDER — FENTANYL 25 MCG/HR TD PT72: 1.0000 | MEDICATED_PATCH | TRANSDERMAL | 0 refills | Status: DC

## 2021-05-31 MED ORDER — OXYCODONE HCL 20 MG PO TABS: 20.0000 mg | ORAL_TABLET | Freq: Four times a day (QID) | ORAL | 0 refills | Status: DC | PRN

## 2021-05-31 NOTE — Telephone Encounter (Signed)
Requesting:FENTANYL AND OXYCODONE Contract: 03/04/21 UDS: 03/04/21  Last Visit: 03/04/21 Next Visit: 06/13/21 Last Refill: 05/06/21  Please Advise

## 2021-06-02 ENCOUNTER — Other Ambulatory Visit: Payer: Self-pay | Admitting: Family Medicine

## 2021-06-13 ENCOUNTER — Other Ambulatory Visit: Payer: Self-pay

## 2021-06-13 ENCOUNTER — Telehealth (INDEPENDENT_AMBULATORY_CARE_PROVIDER_SITE_OTHER): Payer: No Typology Code available for payment source | Admitting: Family Medicine

## 2021-06-13 DIAGNOSIS — M5416 Radiculopathy, lumbar region: Secondary | ICD-10-CM

## 2021-06-13 DIAGNOSIS — E559 Vitamin D deficiency, unspecified: Secondary | ICD-10-CM | POA: Diagnosis not present

## 2021-06-13 DIAGNOSIS — E785 Hyperlipidemia, unspecified: Secondary | ICD-10-CM | POA: Diagnosis not present

## 2021-06-13 DIAGNOSIS — R739 Hyperglycemia, unspecified: Secondary | ICD-10-CM | POA: Diagnosis not present

## 2021-06-13 DIAGNOSIS — R0789 Other chest pain: Secondary | ICD-10-CM

## 2021-06-13 NOTE — Progress Notes (Signed)
MyChart Video Visit    Virtual Visit via Video Note   This visit type was conducted due to national recommendations for restrictions regarding the COVID-19 Pandemic (e.g. social distancing) in an effort to limit this patient's exposure and mitigate transmission in our community. This patient is at least at moderate risk for complications without adequate follow up. This format is felt to be most appropriate for this patient at this time. Physical exam was limited by quality of the video and audio technology used for the visit. S Chism, CMA was able to get the patient set up on a video visit.  Patient location: home Patient and provider in visit Provider location: Office  I discussed the limitations of evaluation and management by telemedicine and the availability of in person appointments. The patient expressed understanding and agreed to proceed.  Visit Date: 06/13/2021  Today's healthcare provider: Penni Homans, MD      Subjective:    Patient ID: [name] [name], female    DOB: 03-Dec-1960, 60 y.o.   MRN: [id number]  Chief Complaint  Patient presents with   3 months f/u    HPI Patient with an hx of anxiety and depression, hypertension, gout and etc. Is presented today for a video visit and other chronic medical concerns. She reports she has been doing fine and no complaints or complications.She has an upcoming cardiology visit coming up due to having chest discomfort in the past. Since July 4th,2022, she includes the episodes was on the left side and felt like it was pressing on her chest and was constant that she notices throughout the day however turned occasional and resolved over time. She also experienced nausea with the episodes. She was thinking about cancelling her appointment but decided to keep. She denies shortness of breath or palpitations. She reports that I currently am not on network insurance and does not want a new physician and is willing to pay. She is getting  her flu vaccination soon, considered getting the new booster vaccination and opted out getting shingrix vaccination at this time but will consider later.  Colonoscopy completed 12/09/2019 and found five 3 to 5 mm polyps in the descending colon, in the transverse colon and in the ascending colon, removed with a cold snare.  Patient denies shortness of breath, palpitations, blood in stool, dizziness, fatigue, headaches, syncope, GU or GI c/o.   Past Medical History:  Diagnosis Date   Acute foot pain, right 01/13/2017   Acute upper respiratory infections of unspecified site 06/12/2014   Anxiety and depression    chronic   Breast cancer screening 09/05/2016   Cervical cancer screening 09/05/2016   Chronic back pain    Depression with anxiety 08/23/2008   Qualifier: Diagnosis of  By: Niel Hummer MD, Lorinda Creed    Gout 05/05/2017   History of colon polyps    History of fibromyalgia    suspected    Hypertension    Insomnia    Knee pain, right 06/24/2015   Meningitis spinal    Morbid obesity ([place])    Ovarian cyst, bilateral    Preventative health care 06/24/2015   PTSD (post-traumatic stress disorder)    Vitamin D deficiency 06/24/2015    Past Surgical History:  Procedure Laterality Date   COLONOSCOPY     OVARIAN CYST REMOVAL      Family History  Problem Relation Age of Onset   Rheum arthritis Father    Diabetes Father    Congestive Heart Failure Father  Hypertension Father    Stroke Father    Cancer Maternal Uncle 64       colon   Hip fracture Maternal Grandmother    COPD Maternal Grandfather 59       colon   Colon cancer Maternal Grandfather    Lymphoma Paternal Grandmother    Colon cancer Mother    Breast cancer Mother        benign   Colon cancer Maternal Aunt    Breast cancer Maternal Great-grandmother    Breast cancer Cousin        negative stage 3   ADD / ADHD Neg Hx    Depression Neg Hx    Alcohol abuse Neg Hx    Drug abuse Neg Hx    Esophageal cancer Neg Hx      Social History   Socioeconomic History   Marital status: Divorced    Spouse name: Not on file   Number of children: Not on file   Years of education: Not on file   Highest education level: Not on file  Occupational History   Not on file  Tobacco Use   Smoking status: Former    Types: Cigarettes    Quit date: 04/04/1989    Years since quitting: 32.2   Smokeless tobacco: Never  Vaping Use   Vaping Use: Never used  Substance and Sexual Activity   Alcohol use: Yes    Comment: occassionally- 2 glassses of wine 2x/month   Drug use: No    Comment: take meds as prescribed   Sexual activity: Not on file  Other Topics Concern   Not on file  Social History Narrative   Not on file   Social Determinants of Health   Financial Resource Strain: Not on file  Food Insecurity: Not on file  Transportation Needs: Not on file  Physical Activity: Not on file  Stress: Not on file  Social Connections: Not on file  Intimate Partner Violence: Not on file    Outpatient Medications Prior to Visit  Medication Sig Dispense Refill   allopurinol (ZYLOPRIM) 100 MG tablet TAKE ONE TABLET BY MOUTH ONE TIME DAILY 90 tablet 0   ALPRAZolam (XANAX) 0.25 MG tablet Take 1 tablet (0.25 mg total) by mouth 2 (two) times daily as needed for anxiety. 5 tablet 1   atorvastatin (LIPITOR) 10 MG tablet TAKE ONE TABLET BY MOUTH ONE TIME DAILY 90 tablet 0   beclomethasone (BECONASE-AQ) 42 MCG/SPRAY nasal spray Place 1 spray into both nostrils 2 (two) times daily. Dose is for each nostril. 25 g 1   colchicine 0.6 MG tablet 2 tabs po once then 1 tab po q 2 hours prn pain til pain gone, max of 6 tabs in 24 hours or intolerable diarrhea 6 tablet 2   cyclobenzaprine (FLEXERIL) 10 MG tablet Take 0.5-1 tablets (5-10 mg total) by mouth 2 (two) times daily as needed for muscle spasms. 40 tablet 2   fentaNYL (DURAGESIC) 25 MCG/HR Place 1 patch onto the skin every 3 (three) days. 10 patch 0   FLUoxetine (PROZAC) 10 MG capsule  Take 1 capsule (10 mg total) by mouth daily. 30 capsule 3   lisinopril (ZESTRIL) 10 MG tablet TAKE ONE TABLET BY MOUTH ONE TIME DAILY 90 tablet 1   metoprolol succinate (TOPROL-XL) 25 MG 24 hr tablet TAKE ONE TABLET BY MOUTH ONE TIME DAILY 90 tablet 1   Oxycodone HCl 20 MG TABS Take 1 tablet (20 mg total) by mouth 4 (four) times daily  as needed. 120 tablet 0   traZODone (DESYREL) 100 MG tablet TAKE ONE TABLET BY MOUTH AT BEDTIME AS NEEDED FOR SLEEP 30 tablet 1   No facility-administered medications prior to visit.    Allergies  Allergen Reactions   Cymbalta [Duloxetine Hcl] Other (See Comments)    Headaches   Fiorinal [Butalbital-Aspirin-Caffeine] Other (See Comments)    Mental Clarity.    Review of Systems  Constitutional:  Negative for fever, malaise/fatigue and weight loss.  HENT:  Negative for congestion, ear pain and sore throat.   Eyes:  Negative for double vision, pain and redness.  Respiratory:  Negative for cough, sputum production and shortness of breath.   Cardiovascular:  Positive for chest pain (chest discomfort). Negative for palpitations and leg swelling.  Gastrointestinal:  Positive for nausea. Negative for abdominal pain and blood in stool.  Genitourinary:  Negative for dysuria and hematuria.  Musculoskeletal:  Negative for back pain, falls and myalgias.  Neurological:  Negative for dizziness, weakness and headaches.  Psychiatric/Behavioral:  Negative for depression. The patient is not nervous/anxious and does not have insomnia.       Objective:    Physical Exam Constitutional:      Appearance: Normal appearance. She is not ill-appearing or toxic-appearing.  Neurological:     Mental Status: She is alert.  Psychiatric:        Mood and Affect: Mood normal.        Behavior: Behavior normal.        Judgment: Judgment normal.    There were no vitals taken for this visit. Wt Readings from Last 3 Encounters:  03/04/21 217 lb (98.4 kg)  12/12/20 210 lb (95.3 kg)   08/28/20 215 lb 6.4 oz (97.7 kg)    Diabetic Foot Exam - Simple   No data filed    Lab Results  Component Value Date   WBC 6.8 12/12/2020   HGB 13.2 12/12/2020   HCT 38.9 12/12/2020   PLT 182.0 12/12/2020   GLUCOSE 93 12/12/2020   CHOL 212 (H) 12/12/2020   TRIG 172.0 (H) 12/12/2020   HDL 76.70 12/12/2020   LDLDIRECT 108.0 08/11/2019   LDLCALC 101 (H) 12/12/2020   ALT 14 12/12/2020   AST 17 12/12/2020   NA 138 12/12/2020   K 3.8 12/12/2020   CL 101 12/12/2020   CREATININE 0.45 12/12/2020   BUN 15 12/12/2020   CO2 28 12/12/2020   TSH 3.23 12/12/2020   HGBA1C 5.5 12/12/2020    Lab Results  Component Value Date   TSH 3.23 12/12/2020   Lab Results  Component Value Date   WBC 6.8 12/12/2020   HGB 13.2 12/12/2020   HCT 38.9 12/12/2020   MCV 93.1 12/12/2020   PLT 182.0 12/12/2020   Lab Results  Component Value Date   NA 138 12/12/2020   K 3.8 12/12/2020   CO2 28 12/12/2020   GLUCOSE 93 12/12/2020   BUN 15 12/12/2020   CREATININE 0.45 12/12/2020   BILITOT 0.7 12/12/2020   ALKPHOS 66 12/12/2020   AST 17 12/12/2020   ALT 14 12/12/2020   PROT 6.7 12/12/2020   ALBUMIN 4.2 12/12/2020   CALCIUM 10.3 12/12/2020   GFR 105.51 12/12/2020   Lab Results  Component Value Date   CHOL 212 (H) 12/12/2020   Lab Results  Component Value Date   HDL 76.70 12/12/2020   Lab Results  Component Value Date   LDLCALC 101 (H) 12/12/2020   Lab Results  Component Value Date   TRIG  172.0 (H) 12/12/2020   Lab Results  Component Value Date   CHOLHDL 3 12/12/2020   Lab Results  Component Value Date   HGBA1C 5.5 12/12/2020       Assessment & Plan:   Problem List Items Addressed This Visit     Hyperlipidemia    Encourage heart healthy diet such as MIND or DASH diet, increase exercise, avoid trans fats, simple carbohydrates and processed foods, consider a krill or fish or flaxseed oil cap daily. Tolerating Atorvastatin      Lumbar radiculopathy    Encouraged moist  heat and gentle stretching as tolerated. May try NSAIDs and prescription meds as directed and report if symptoms worsen or seek immediate care. Continue current meds for now.      Atypical chest pain    Dating back to July 4 she has had some episodes of chest discomfort but she has not had one recently. She actually has an appt set up with cardiology within the week and almost cancelled it as she is feeling better but has kept the appt and is encouraged to proceed with cardiac evaluation. She did not note any associated symptoms except for some possible nausea. No pattern as to hen episodes occurred. She will seek care if occurs and does not resolve.       Vitamin D deficiency    Supplement and monitor      Hyperglycemia    hgba1c acceptable, minimize simple carbs. Increase exercise as tolerated.        F/U in 4 months  No orders of the defined types were placed in this encounter.   I discussed the assessment and treatment plan with the patient. The patient was provided an opportunity to ask questions and all were answered. The patient agreed with the plan and demonstrated an understanding of the instructions.   The patient was advised to call back or seek an in-person evaluation if the symptoms worsen or if the condition fails to improve as anticipated.  I provided I, Mosie Lukes, MD personally performed the services described in this documentation. All medical record entries made by the scribe were at my direction and in my presence. I have reviewed the chart and agree that the record reflects my personal performance and is accurate and complete   minutes of face-to-face time during this encounter.    I,Jada Bradford,acting as a Education administrator for Penni Homans, MD.,have documented all relevant documentation on the behalf of Penni Homans, MD,as directed by  Penni Homans, MD while in the presence of Penni Homans, MD.  I, Mosie Lukes, MD personally performed the services described in  this documentation. All medical record entries made by the scribe were at my direction and in my presence. I have reviewed the chart and agree that the record reflects my personal performance and is accurate and complete    Penni Homans, MD [facility] [place] (phone) [phone] (fax)  [place]

## 2021-06-14 NOTE — Assessment & Plan Note (Signed)
Supplement and monitor 

## 2021-06-14 NOTE — Assessment & Plan Note (Signed)
hgba1c acceptable, minimize simple carbs. Increase exercise as tolerated.  

## 2021-06-14 NOTE — Assessment & Plan Note (Signed)
Encouraged moist heat and gentle stretching as tolerated. May try NSAIDs and prescription meds as directed and report if symptoms worsen or seek immediate care. Continue current meds for now.

## 2021-06-14 NOTE — Assessment & Plan Note (Signed)
Encourage heart healthy diet such as MIND or DASH diet, increase exercise, avoid trans fats, simple carbohydrates and processed foods, consider a krill or fish or flaxseed oil cap daily. Tolerating Atorvastatin 

## 2021-06-14 NOTE — Assessment & Plan Note (Signed)
Dating back to July 4 she has had some episodes of chest discomfort but she has not had one recently. She actually has an appt set up with cardiology within the week and almost cancelled it as she is feeling better but has kept the appt and is encouraged to proceed with cardiac evaluation. She did not note any associated symptoms except for some possible nausea. No pattern as to hen episodes occurred. She will seek care if occurs and does not resolve.

## 2021-06-30 ENCOUNTER — Other Ambulatory Visit: Payer: Self-pay | Admitting: Family Medicine

## 2021-07-01 MED ORDER — FENTANYL 25 MCG/HR TD PT72: 1.0000 | MEDICATED_PATCH | TRANSDERMAL | 0 refills | Status: DC

## 2021-07-01 MED ORDER — OXYCODONE HCL 20 MG PO TABS: 20.0000 mg | ORAL_TABLET | Freq: Four times a day (QID) | ORAL | 0 refills | Status: DC | PRN

## 2021-07-01 NOTE — Telephone Encounter (Signed)
Requesting: oxycodone and fentanyl Contract: 03/04/21 UDS: 03/04/21 Last Visit: 06/13/21 Next Visit: 10/15/21 Last Refill: 05/31/21  Please Advise

## 2021-07-02 ENCOUNTER — Other Ambulatory Visit: Payer: Self-pay | Admitting: Family Medicine

## 2021-07-15 ENCOUNTER — Other Ambulatory Visit: Payer: Self-pay | Admitting: Family Medicine

## 2021-07-15 ENCOUNTER — Encounter: Payer: Self-pay | Admitting: Family Medicine

## 2021-07-16 ENCOUNTER — Other Ambulatory Visit: Payer: Self-pay | Admitting: Family Medicine

## 2021-07-16 MED ORDER — FLUOXETINE HCL 10 MG PO CAPS: 10.0000 mg | ORAL_CAPSULE | Freq: Every day | ORAL | 2 refills | Status: DC

## 2021-07-16 MED ORDER — LISINOPRIL 10 MG PO TABS: 10.0000 mg | ORAL_TABLET | Freq: Every day | ORAL | 2 refills | Status: DC

## 2021-07-16 MED ORDER — METOPROLOL SUCCINATE ER 25 MG PO TB24: 25.0000 mg | ORAL_TABLET | Freq: Every day | ORAL | 2 refills | Status: DC

## 2021-07-16 MED ORDER — TRAZODONE HCL 100 MG PO TABS: 100.0000 mg | ORAL_TABLET | Freq: Every evening | ORAL | 2 refills | Status: DC | PRN

## 2021-07-26 ENCOUNTER — Other Ambulatory Visit: Payer: Self-pay | Admitting: Family Medicine

## 2021-07-26 MED ORDER — OXYCODONE HCL 20 MG PO TABS: 20.0000 mg | ORAL_TABLET | Freq: Four times a day (QID) | ORAL | 0 refills | Status: DC | PRN

## 2021-07-26 MED ORDER — FENTANYL 25 MCG/HR TD PT72: 1.0000 | MEDICATED_PATCH | TRANSDERMAL | 0 refills | Status: DC

## 2021-07-26 NOTE — Telephone Encounter (Signed)
Requesting: fentanyl 31mcg/hr and oxycodone 20mg  Contract: 03/04/2021 UDS: 03/04/2021 Last Visit: 06/13/2021 Next Visit: 10/15/2021 Last Refill on 07/01/2021 on Fentanyl: #10 and 0RF Last Refill on oxycodone 07/01/2021 #120 and 0RF  Please Advise

## 2021-07-31 ENCOUNTER — Other Ambulatory Visit: Payer: Self-pay | Admitting: Family Medicine

## 2021-08-01 ENCOUNTER — Other Ambulatory Visit: Payer: Self-pay | Admitting: Family Medicine

## 2021-08-07 ENCOUNTER — Other Ambulatory Visit: Payer: Self-pay | Admitting: Family Medicine

## 2021-08-22 ENCOUNTER — Other Ambulatory Visit: Payer: Self-pay | Admitting: Family Medicine

## 2021-08-22 ENCOUNTER — Encounter: Payer: Self-pay | Admitting: Family Medicine

## 2021-08-26 MED ORDER — OXYCODONE HCL 20 MG PO TABS: 20.0000 mg | ORAL_TABLET | Freq: Four times a day (QID) | ORAL | 0 refills | Status: DC | PRN

## 2021-08-26 MED ORDER — FENTANYL 25 MCG/HR TD PT72: 1.0000 | MEDICATED_PATCH | TRANSDERMAL | 0 refills | Status: DC

## 2021-08-26 NOTE — Telephone Encounter (Signed)
Requesting: fentanyl and oxycodone Contract: 03/04/21 UDS: 03/04/21 Last Visit: 06/13/21 Next Visit: 10/15/21 Last Refill: 07/26/21  Please Advise

## 2021-08-26 NOTE — Telephone Encounter (Signed)
Requesting: fentanyl 90mcg/hr, oxycodone 20mg  Contract: 03/04/2021 UDS: 03/04/2021 Last Visit: 06/13/2021 Next Visit: 10/15/2021 Last Refill on both: 07/26/2021   Please Advise

## 2021-09-18 ENCOUNTER — Other Ambulatory Visit: Payer: Self-pay | Admitting: Family Medicine

## 2021-09-18 ENCOUNTER — Encounter: Payer: Self-pay | Admitting: Family Medicine

## 2021-09-18 MED ORDER — METHYLPREDNISOLONE 4 MG PO TABS: ORAL_TABLET | ORAL | 0 refills | Status: DC

## 2021-09-18 MED ORDER — FENTANYL 25 MCG/HR TD PT72: 1.0000 | MEDICATED_PATCH | TRANSDERMAL | 0 refills | Status: DC

## 2021-09-18 MED ORDER — OXYCODONE HCL 20 MG PO TABS: 20.0000 mg | ORAL_TABLET | Freq: Four times a day (QID) | ORAL | 0 refills | Status: DC | PRN

## 2021-09-18 MED ORDER — COLCHICINE 0.6 MG PO TABS: ORAL_TABLET | ORAL | 2 refills | Status: DC

## 2021-09-18 NOTE — Telephone Encounter (Signed)
Requesting: fentanyl and oxycodone Contract: 03/04/2021 UDS: 03/04/2021 Last Visit: 06/13/2021 Next Visit: 10/15/2021 Last Refill on fentanyl: 08/26/2021 #10 and 0RF Last Refill on oxycodone: 08/26/2021 #120 and 0RF  Please Advise

## 2021-10-13 ENCOUNTER — Other Ambulatory Visit: Payer: Self-pay | Admitting: Family Medicine

## 2021-10-15 ENCOUNTER — Encounter: Payer: Self-pay | Admitting: Family Medicine

## 2021-10-15 ENCOUNTER — Ambulatory Visit (INDEPENDENT_AMBULATORY_CARE_PROVIDER_SITE_OTHER): Payer: 59 | Admitting: Family Medicine

## 2021-10-15 VITALS — BP 118/64 | HR 80 | Temp 98.1°F | Resp 16 | Ht 65.0 in | Wt 241.6 lb

## 2021-10-15 DIAGNOSIS — I10 Essential (primary) hypertension: Secondary | ICD-10-CM

## 2021-10-15 DIAGNOSIS — E559 Vitamin D deficiency, unspecified: Secondary | ICD-10-CM | POA: Diagnosis not present

## 2021-10-15 DIAGNOSIS — M5416 Radiculopathy, lumbar region: Secondary | ICD-10-CM

## 2021-10-15 DIAGNOSIS — M79672 Pain in left foot: Secondary | ICD-10-CM | POA: Diagnosis not present

## 2021-10-15 DIAGNOSIS — M1A9XX0 Chronic gout, unspecified, without tophus (tophi): Secondary | ICD-10-CM

## 2021-10-15 DIAGNOSIS — Z124 Encounter for screening for malignant neoplasm of cervix: Secondary | ICD-10-CM

## 2021-10-15 DIAGNOSIS — E785 Hyperlipidemia, unspecified: Secondary | ICD-10-CM

## 2021-10-15 DIAGNOSIS — E669 Obesity, unspecified: Secondary | ICD-10-CM

## 2021-10-15 DIAGNOSIS — R739 Hyperglycemia, unspecified: Secondary | ICD-10-CM | POA: Diagnosis not present

## 2021-10-15 MED ORDER — WEGOVY 0.25 MG/0.5ML ~~LOC~~ SOAJ: 0.2500 mg | SUBCUTANEOUS | 0 refills | Status: DC

## 2021-10-15 NOTE — Patient Instructions (Signed)
Gout ?Gout is a condition that causes painful swelling of the joints. Gout is a type of inflammation of the joints (arthritis). This condition is caused by having too much uric acid in the body. Uric acid is a chemical that forms when the body breaks down substances called purines. Purines are important for building body proteins. ?When the body has too much uric acid, sharp crystals can form and build up inside the joints. This causes pain and swelling. Gout attacks can happen quickly and may be very painful (acute gout). Over time, the attacks can affect more joints and become more frequent (chronic gout). Gout can also cause uric acid to build up under the skin and inside the kidneys. ?What are the causes? ?This condition is caused by too much uric acid in your blood. This can happen because: ?Your kidneys do not remove enough uric acid from your blood. This is the most common cause. ?Your body makes too much uric acid. This can happen with some cancers and cancer treatments. It can also occur if your body is breaking down too many red blood cells (hemolytic anemia). ?You eat too many foods that are high in purines. These foods include organ meats and some seafood. Alcohol, especially beer, is also high in purines. ?A gout attack may be triggered by trauma or stress. ?What increases the risk? ?You are more likely to develop this condition if you: ?Have a family history of gout. ?Are female and middle-aged. ?Are female and have gone through menopause. ?Are obese. ?Frequently drink alcohol, especially beer. ?Are dehydrated. ?Lose weight too quickly. ?Have an organ transplant. ?Have lead poisoning. ?Take certain medicines, including aspirin, cyclosporine, diuretics, levodopa, and niacin. ?Have kidney disease. ?Have a skin condition called psoriasis. ?What are the signs or symptoms? ?An attack of acute gout happens quickly. It usually occurs in just one joint. The most common place is the big toe. Attacks often start  at night. Other joints that may be affected include joints of the feet, ankle, knee, fingers, wrist, or elbow. Symptoms of this condition may include: ?Severe pain. ?Warmth. ?Swelling. ?Stiffness. ?Tenderness. The affected joint may be very painful to touch. ?Shiny, red, or purple skin. ?Chills and fever. ?Chronic gout may cause symptoms more frequently. More joints may be involved. You may also have white or yellow lumps (tophi) on your hands or feet or in other areas near your joints. ?How is this diagnosed? ?This condition is diagnosed based on your symptoms, medical history, and physical exam. You may have tests, such as: ?Blood tests to measure uric acid levels. ?Removal of joint fluid with a thin needle (aspiration) to look for uric acid crystals. ?X-rays to look for joint damage. ?How is this treated? ?Treatment for this condition has two phases: treating an acute attack and preventing future attacks. Acute gout treatment may include medicines to reduce pain and swelling, including: ?NSAIDs. ?Steroids. These are strong anti-inflammatory medicines that can be taken by mouth (orally) or injected into a joint. ?Colchicine. This medicine relieves pain and swelling when it is taken soon after an attack. It can be given by mouth or through an IV. ?Preventive treatment may include: ?Daily use of smaller doses of NSAIDs or colchicine. ?Use of a medicine that reduces uric acid levels in your blood. ?Changes to your diet. You may need to see a dietitian about what to eat and drink to prevent gout. ?Follow these instructions at home: ?During a gout attack ? ?If directed, put ice on the affected area: ?  Put ice in a plastic bag. ?Place a towel between your skin and the bag. ?Leave the ice on for 20 minutes, 2-3 times a day. ?Raise (elevate) the affected joint above the level of your heart as often as possible. ?Rest the joint as much as possible. If the affected joint is in your leg, you may be given crutches to  use. ?Follow instructions from your health care provider about eating or drinking restrictions. ?Avoiding future gout attacks ?Follow a low-purine diet as told by your dietitian or health care provider. Avoid foods and drinks that are high in purines, including liver, kidney, anchovies, asparagus, herring, mushrooms, mussels, and beer. ?Maintain a healthy weight or lose weight if you are overweight. If you want to lose weight, talk with your health care provider. It is important that you do not lose weight too quickly. ?Start or maintain an exercise program as told by your health care provider. ?Eating and drinking ?Drink enough fluids to keep your urine pale yellow. ?If you drink alcohol: ?Limit how much you use to: ?0-1 drink a day for women. ?0-2 drinks a day for men. ?Be aware of how much alcohol is in your drink. In the [place] one drink equals one 12 oz bottle of beer (355 mL) one 5 oz glass of wine (148 mL), or one 1? oz glass of hard liquor (44 mL). ?General instructions ?Take over-the-counter and prescription medicines only as told by your health care provider. ?Do not drive or use heavy machinery while taking prescription pain medicine. ?Return to your normal activities as told by your health care provider. Ask your health care provider what activities are safe for you. ?Keep all follow-up visits as told by your health care provider. This is important. ?Contact a health care provider if you have: ?Another gout attack. ?Continuing symptoms of a gout attack after 10 days of treatment. ?Side effects from your medicines. ?Chills or a fever. ?Burning pain when you urinate. ?Pain in your lower back or belly. ?Get help right away if you: ?Have severe or uncontrolled pain. ?Cannot urinate. ?Summary ?Gout is painful swelling of the joints caused by inflammation. ?The most common site of pain is the big toe, but it can affect other joints in the body. ?Medicines and dietary changes can help to prevent and treat gout  attacks. ?This information is not intended to replace advice given to you by your health care provider. Make sure you discuss any questions you have with your health care provider. ?Document Revised: 03/26/2018 Document Reviewed: 04/07/2018 ?Elsevier Patient Education ? 2022 [employer] ? ?

## 2021-10-15 NOTE — Assessment & Plan Note (Signed)
Supplement and monitor 

## 2021-10-15 NOTE — Assessment & Plan Note (Signed)
Pain left foot at base of second toe, swollen and red. Check uric acid

## 2021-10-15 NOTE — Assessment & Plan Note (Signed)
Several days ago she was on her feet all day and she developed pain at base of second toe with swelling. Over night the swelling over the top of the foot resolved the swelling on the bottom of the foot persists. No warmth, some redness noted on bottom.

## 2021-10-15 NOTE — Assessment & Plan Note (Signed)
Well controlled, no changes to meds. Encouraged heart healthy diet such as the DASH diet and exercise as tolerated.  °

## 2021-10-15 NOTE — Assessment & Plan Note (Signed)
Tolerating statin, encouraged heart healthy diet, avoid trans fats, minimize simple carbs and saturated fats. Increase exercise as tolerated 

## 2021-10-15 NOTE — Assessment & Plan Note (Signed)
hgba1c acceptable, minimize simple carbs. Increase exercise as tolerated.  

## 2021-10-16 ENCOUNTER — Encounter: Payer: Self-pay | Admitting: Family Medicine

## 2021-10-16 LAB — COMPREHENSIVE METABOLIC PANEL
ALT: 18 U/L (ref 0–35)
AST: 19 U/L (ref 0–37)
Albumin: 4.2 g/dL (ref 3.5–5.2)
Alkaline Phosphatase: 73 U/L (ref 39–117)
BUN: 17 mg/dL (ref 6–23)
CO2: 31 mEq/L (ref 19–32)
Calcium: 10.5 mg/dL (ref 8.4–10.5)
Chloride: 100 mEq/L (ref 96–112)
Creatinine, Ser: 0.56 mg/dL (ref 0.40–1.20)
GFR: 99.51 mL/min (ref >60.00)
Glucose, Bld: 95 mg/dL (ref 70–99)
Potassium: 4 mEq/L (ref 3.5–5.1)
Sodium: 140 mEq/L (ref 135–145)
Total Bilirubin: 0.3 mg/dL (ref 0.2–1.2)
Total Protein: 6.7 g/dL (ref 6.0–8.3)

## 2021-10-16 LAB — LDL CHOLESTEROL, DIRECT: Direct LDL: 95 mg/dL

## 2021-10-16 LAB — CBC
HCT: 40.2 % (ref 36.0–46.0)
Hemoglobin: 13.3 g/dL (ref 12.0–15.0)
MCHC: 33.1 g/dL (ref 30.0–36.0)
MCV: 95.6 fl (ref 78.0–100.0)
Platelets: 175 10*3/uL (ref 150.0–400.0)
RBC: 4.21 Mil/uL (ref 3.87–5.11)
RDW: 13.5 % (ref 11.5–15.5)
WBC: 7.3 10*3/uL (ref 4.0–10.5)

## 2021-10-16 LAB — LIPID PANEL
Cholesterol: 211 mg/dL — ABNORMAL HIGH (ref 0–200)
HDL: 69.3 mg/dL (ref >39.00)
NonHDL: 141.29
Total CHOL/HDL Ratio: 3
Triglycerides: 376 mg/dL — ABNORMAL HIGH (ref 0.0–149.0)
VLDL: 75.2 mg/dL — ABNORMAL HIGH (ref 0.0–40.0)

## 2021-10-16 LAB — URIC ACID: Uric Acid, Serum: 5.6 mg/dL (ref 2.4–7.0)

## 2021-10-16 LAB — HEMOGLOBIN A1C: Hgb A1c MFr Bld: 5.7 % (ref 4.6–6.5)

## 2021-10-16 LAB — TSH: TSH: 4.16 u[IU]/mL (ref 0.35–5.50)

## 2021-10-16 NOTE — Progress Notes (Signed)
Subjective:    Patient ID: [name] [name], female    DOB: [birth year], 61 y.o.   MRN: [id number]  Chief Complaint  Patient presents with   Follow-up    HPI Patient is in today for follow-up on chronic medical concerns.  No recent febrile illness or hospitalizations.  She is having trouble with increased pain in her left foot.  She notes she has been extended hours on it 1 day and has been having pain ever since.  She had swelling on the top on the bottom but that is improving.  She notes some warmth but no redness.  She denies trauma or injury.  Otherwise she has no acute concerns. Denies CP/palp/SOB/HA/congestion/fevers/GI or GU c/o. Taking meds as prescribed   Past Medical History:  Diagnosis Date   Acute foot pain, right 01/13/2017   Acute upper respiratory infections of unspecified site 06/12/2014   Anxiety and depression    chronic   Breast cancer screening 09/05/2016   Cervical cancer screening 09/05/2016   Chronic back pain    Depression with anxiety 08/23/2008   Qualifier: Diagnosis of  By: Niel Hummer MD, Lorinda Creed    Gout 05/05/2017   History of colon polyps    History of fibromyalgia    suspected    Hypertension    Insomnia    Knee pain, right 06/24/2015   Meningitis spinal    Morbid obesity ([place])    Ovarian cyst, bilateral    Preventative health care 06/24/2015   PTSD (post-traumatic stress disorder)    Vitamin D deficiency 06/24/2015    Past Surgical History:  Procedure Laterality Date   COLONOSCOPY     OVARIAN CYST REMOVAL      Family History  Problem Relation Age of Onset   Rheum arthritis Father    Diabetes Father    Congestive Heart Failure Father    Hypertension Father    Stroke Father    Cancer Maternal Uncle 69       colon   Hip fracture Maternal Grandmother    COPD Maternal Grandfather 87       colon   Colon cancer Maternal Grandfather    Lymphoma Paternal Grandmother    Colon cancer Mother    Breast cancer Mother        benign   Colon cancer  Maternal Aunt    Breast cancer Maternal Great-grandmother    Breast cancer Cousin        negative stage 3   ADD / ADHD Neg Hx    Depression Neg Hx    Alcohol abuse Neg Hx    Drug abuse Neg Hx    Esophageal cancer Neg Hx     Social History   Socioeconomic History   Marital status: Divorced    Spouse name: Not on file   Number of children: Not on file   Years of education: Not on file   Highest education level: Not on file  Occupational History   Not on file  Tobacco Use   Smoking status: Former    Types: Cigarettes    Quit date: 04/04/1989    Years since quitting: 32.5   Smokeless tobacco: Never  Vaping Use   Vaping Use: Never used  Substance and Sexual Activity   Alcohol use: Yes    Comment: occassionally- 2 glassses of wine 2x/month   Drug use: No    Comment: take meds as prescribed   Sexual activity: Not on file  Other Topics Concern  Not on file  Social History Narrative   Not on file   Social Determinants of Health   Financial Resource Strain: Not on file  Food Insecurity: Not on file  Transportation Needs: Not on file  Physical Activity: Not on file  Stress: Not on file  Social Connections: Not on file  Intimate Partner Violence: Not on file    Outpatient Medications Prior to Visit  Medication Sig Dispense Refill   allopurinol (ZYLOPRIM) 100 MG tablet TAKE ONE TABLET BY MOUTH ONE TIME DAILY 30 tablet 6   ALPRAZolam (XANAX) 0.25 MG tablet Take 1 tablet (0.25 mg total) by mouth 2 (two) times daily as needed for anxiety. 5 tablet 1   atorvastatin (LIPITOR) 10 MG tablet TAKE ONE TABLET BY MOUTH ONE TIME DAILY 90 tablet 0   beclomethasone (BECONASE-AQ) 42 MCG/SPRAY nasal spray Place 1 spray into both nostrils 2 (two) times daily. Dose is for each nostril. 25 g 1   colchicine 0.6 MG tablet 2 tabs po once then 1 tab po q 2 hours prn pain til pain gone, max of 6 tabs in 24 hours or intolerable diarrhea 6 tablet 2   cyclobenzaprine (FLEXERIL) 10 MG tablet Take  0.5-1 tablets (5-10 mg total) by mouth 2 (two) times daily as needed for muscle spasms. 40 tablet 2   fentaNYL (DURAGESIC) 25 MCG/HR Place 1 patch onto the skin every 3 (three) days. 10 patch 0   FLUoxetine (PROZAC) 10 MG capsule Take 1 capsule (10 mg total) by mouth daily. 30 capsule 2   lisinopril (ZESTRIL) 10 MG tablet TAKE ONE TABLET BY MOUTH ONE TIME DAILY 30 tablet 0   methylPREDNISolone (MEDROL) 4 MG tablet 5 tabs po x 1 day then 4 tabs po x 1 day then 3 tabs po x 1 day then 2 tabs po x 1 day then 1 tab po x 1 day and stop 15 tablet 0   metoprolol succinate (TOPROL-XL) 25 MG 24 hr tablet TAKE ONE TABLET BY MOUTH ONE TIME DAILY 30 tablet 0   Oxycodone HCl 20 MG TABS Take 1 tablet (20 mg total) by mouth 4 (four) times daily as needed. 120 tablet 0   traZODone (DESYREL) 100 MG tablet TAKE ONE TABLET BY MOUTH AT BEDTIME AS NEEDED FOR SLEEP 30 tablet 1   No facility-administered medications prior to visit.    Allergies  Allergen Reactions   Cymbalta [Duloxetine Hcl] Other (See Comments)    Headaches   Fiorinal [Butalbital-Aspirin-Caffeine] Other (See Comments)    Mental Clarity.    Review of Systems  Constitutional:  Negative for fever and malaise/fatigue.  HENT:  Negative for congestion.   Eyes:  Negative for blurred vision.  Respiratory:  Negative for shortness of breath.   Cardiovascular:  Negative for chest pain, palpitations and leg swelling.  Gastrointestinal:  Negative for abdominal pain, blood in stool and nausea.  Genitourinary:  Negative for dysuria and frequency.  Musculoskeletal:  Positive for back pain and joint pain. Negative for falls.  Skin:  Negative for rash.  Neurological:  Negative for dizziness, loss of consciousness and headaches.  Endo/Heme/Allergies:  Negative for environmental allergies.  Psychiatric/Behavioral:  Negative for depression. The patient is not nervous/anxious.       Objective:    Physical Exam Constitutional:      General: She is not in  acute distress.    Appearance: She is well-developed.  HENT:     Head: Normocephalic and atraumatic.  Eyes:     Conjunctiva/sclera: Conjunctivae  normal.  Neck:     Thyroid: No thyromegaly.  Cardiovascular:     Rate and Rhythm: Normal rate and regular rhythm.     Heart sounds: Normal heart sounds. No murmur heard. Pulmonary:     Effort: Pulmonary effort is normal. No respiratory distress.     Breath sounds: Normal breath sounds.  Abdominal:     General: Bowel sounds are normal. There is no distension.     Palpations: Abdomen is soft. There is no mass.     Tenderness: There is no abdominal tenderness.  Musculoskeletal:     Cervical back: Neck supple.  Lymphadenopathy:     Cervical: No cervical adenopathy.  Skin:    General: Skin is warm and dry.  Neurological:     Mental Status: She is alert and oriented to person, place, and time.  Psychiatric:        Behavior: Behavior normal.    BP 118/64    Pulse 80    Temp 98.1 F (36.7 C)    Resp 16    Ht 5\' 5"  (1.651 m)    Wt 241 lb 9.6 oz (109.6 kg)    SpO2 98%    BMI 40.20 kg/m  Wt Readings from Last 3 Encounters:  10/15/21 241 lb 9.6 oz (109.6 kg)  03/04/21 217 lb (98.4 kg)  12/12/20 210 lb (95.3 kg)    Diabetic Foot Exam - Simple   No data filed    Lab Results  Component Value Date   WBC 7.3 10/15/2021   HGB 13.3 10/15/2021   HCT 40.2 10/15/2021   PLT 175.0 10/15/2021   GLUCOSE 95 10/15/2021   CHOL 211 (H) 10/15/2021   TRIG 376.0 (H) 10/15/2021   HDL 69.30 10/15/2021   LDLDIRECT 95.0 10/15/2021   LDLCALC 101 (H) 12/12/2020   ALT 18 10/15/2021   AST 19 10/15/2021   NA 140 10/15/2021   K 4.0 10/15/2021   CL 100 10/15/2021   CREATININE 0.56 10/15/2021   BUN 17 10/15/2021   CO2 31 10/15/2021   TSH 4.16 10/15/2021   HGBA1C 5.7 10/15/2021    Lab Results  Component Value Date   TSH 4.16 10/15/2021   Lab Results  Component Value Date   WBC 7.3 10/15/2021   HGB 13.3 10/15/2021   HCT 40.2 10/15/2021   MCV  95.6 10/15/2021   PLT 175.0 10/15/2021   Lab Results  Component Value Date   NA 140 10/15/2021   K 4.0 10/15/2021   CO2 31 10/15/2021   GLUCOSE 95 10/15/2021   BUN 17 10/15/2021   CREATININE 0.56 10/15/2021   BILITOT 0.3 10/15/2021   ALKPHOS 73 10/15/2021   AST 19 10/15/2021   ALT 18 10/15/2021   PROT 6.7 10/15/2021   ALBUMIN 4.2 10/15/2021   CALCIUM 10.5 10/15/2021   GFR 99.51 10/15/2021   Lab Results  Component Value Date   CHOL 211 (H) 10/15/2021   Lab Results  Component Value Date   HDL 69.30 10/15/2021   Lab Results  Component Value Date   LDLCALC 101 (H) 12/12/2020   Lab Results  Component Value Date   TRIG 376.0 (H) 10/15/2021   Lab Results  Component Value Date   CHOLHDL 3 10/15/2021   Lab Results  Component Value Date   HGBA1C 5.7 10/15/2021       Assessment & Plan:   Problem List Items Addressed This Visit     Hyperlipidemia    Tolerating statin, encouraged heart healthy diet, avoid trans  fats, minimize simple carbs and saturated fats. Increase exercise as tolerated      Relevant Orders   Lipid panel (Completed)   Obesity    Encouraged DASH or MIND diet, decrease po intake and increase exercise as tolerated. Needs 7-8 hours of sleep nightly. Avoid trans fats, eat small, frequent meals every 4-5 hours with lean proteins, complex carbs and healthy fats. Minimize simple carbs, high fat foods and processed foods. Consider [phone] or Saxenda      Relevant Medications   Semaglutide-Weight Management (WEGOVY) 0.25 MG/0.5ML SOAJ   Essential hypertension    Well controlled, no changes to meds. Encouraged heart healthy diet such as the DASH diet and exercise as tolerated.       Relevant Orders   CBC (Completed)   Comprehensive metabolic panel (Completed)   TSH (Completed)   Lumbar radiculopathy    Stable on current meds, no changes      Relevant Medications   Semaglutide-Weight Management (WEGOVY) 0.25 MG/0.5ML SOAJ   Vitamin D deficiency     Supplement and monitor      Cervical cancer screening - Primary   Relevant Orders   Ambulatory referral to Obstetrics / Gynecology   Left foot pain    Several days ago she was on her feet all day and she developed pain at base of second toe with swelling. Over night the swelling over the top of the foot resolved the swelling on the bottom of the foot persists. No warmth, some redness noted on bottom.      Gout    Pain left foot at base of second toe, swollen and red. Check uric acid      Relevant Orders   Uric acid (Completed)   Hyperglycemia    hgba1c acceptable, minimize simple carbs. Increase exercise as tolerated.       Relevant Orders   Hemoglobin A1c (Completed)    I am having Soraiya C. [facility]" start on Wegovy. I am also having her maintain her beclomethasone, cyclobenzaprine, ALPRAZolam, FLUoxetine, traZODone, allopurinol, atorvastatin, fentaNYL, Oxycodone HCl, colchicine, methylPREDNISolone, lisinopril, and metoprolol succinate.  Meds ordered this encounter  Medications   Semaglutide-Weight Management (WEGOVY) 0.25 MG/0.5ML SOAJ    Sig: Inject 0.25 mg into the skin once a week.    Dispense:  2 mL    Refill:  0     Penni Homans, MD

## 2021-10-16 NOTE — Assessment & Plan Note (Signed)
Encouraged DASH or MIND diet, decrease po intake and increase exercise as tolerated. Needs 7-8 hours of sleep nightly. Avoid trans fats, eat small, frequent meals every 4-5 hours with lean proteins, complex carbs and healthy fats. Minimize simple carbs, high fat foods and processed foods. Consider Wegovy or Saxenda °

## 2021-10-16 NOTE — Assessment & Plan Note (Signed)
Stable on current meds, no changes. 

## 2021-10-17 ENCOUNTER — Other Ambulatory Visit: Payer: Self-pay

## 2021-10-17 ENCOUNTER — Encounter: Payer: Self-pay | Admitting: Family Medicine

## 2021-10-17 MED ORDER — ATORVASTATIN CALCIUM 20 MG PO TABS: 20.0000 mg | ORAL_TABLET | Freq: Every day | ORAL | 1 refills | Status: DC

## 2021-10-18 ENCOUNTER — Other Ambulatory Visit: Payer: Self-pay | Admitting: Family Medicine

## 2021-10-20 ENCOUNTER — Other Ambulatory Visit: Payer: Self-pay | Admitting: Family Medicine

## 2021-10-21 ENCOUNTER — Telehealth: Payer: Self-pay

## 2021-10-21 MED ORDER — FENTANYL 25 MCG/HR TD PT72: 1.0000 | MEDICATED_PATCH | TRANSDERMAL | 0 refills | Status: DC

## 2021-10-21 MED ORDER — OXYCODONE HCL 20 MG PO TABS: 20.0000 mg | ORAL_TABLET | Freq: Four times a day (QID) | ORAL | 0 refills | Status: DC | PRN

## 2021-10-21 NOTE — Telephone Encounter (Signed)
Requesting: fentanyl and oxycodone Contract: 03/04/21 UDS: 03/04/21 Last Visit:  10/15/21 Next Visit: 02/10/22 Last Refill: 09/18/21  Please Advise

## 2021-10-21 NOTE — Telephone Encounter (Signed)
PA initiated via Covermymeds; KEY: [id number]. Awaiting determination.

## 2021-10-21 NOTE — Telephone Encounter (Signed)
PA approved. PA Case: [id number], Status: Approved, Coverage Starts on: 10/21/2021 12:00 AM, Coverage Ends on: 04/20/2022 12:00 AM. Questions? Contact [phone]

## 2021-11-14 ENCOUNTER — Other Ambulatory Visit: Payer: Self-pay | Admitting: Family Medicine

## 2021-11-14 MED ORDER — OXYCODONE HCL 20 MG PO TABS: 20.0000 mg | ORAL_TABLET | Freq: Four times a day (QID) | ORAL | 0 refills | Status: DC | PRN

## 2021-11-14 MED ORDER — FENTANYL 25 MCG/HR TD PT72: 1.0000 | MEDICATED_PATCH | TRANSDERMAL | 0 refills | Status: DC

## 2021-11-14 NOTE — Telephone Encounter (Signed)
Requesting: oxycodone and fentanyl Contract: 03/04/21 UDS: 03/04/21 Last Visit: 10/15/21 Next Visit: 02/10/22 Last Refill: 10/21/21  Please Advise

## 2021-12-11 ENCOUNTER — Encounter: Payer: Self-pay | Admitting: Family Medicine

## 2021-12-12 MED ORDER — OXYCODONE HCL 20 MG PO TABS: 20.0000 mg | ORAL_TABLET | Freq: Four times a day (QID) | ORAL | 0 refills | Status: DC | PRN

## 2021-12-12 MED ORDER — FENTANYL 25 MCG/HR TD PT72: 1.0000 | MEDICATED_PATCH | TRANSDERMAL | 0 refills | Status: DC

## 2021-12-12 NOTE — Telephone Encounter (Signed)
Requesting: oxycodone '20mg'$   ?Contract:03/04/2021 ?UDS:03/04/2021 ?Last Visit: 10/15/2021 ?Next Visit: 02/10/2022 ?Last Refill on oxycodone: 11/14/2021 #120 and 0RF ?Last Refill on Fentanyl: 11/14/2021 #10 and 0RF ? ?Please Advise ? ?

## 2021-12-14 ENCOUNTER — Other Ambulatory Visit: Payer: Self-pay | Admitting: Family Medicine

## 2021-12-16 NOTE — Telephone Encounter (Signed)
These were filled on 12/12/20 can you deny these. ? ?

## 2021-12-18 ENCOUNTER — Other Ambulatory Visit (HOSPITAL_COMMUNITY)
Admission: RE | Admit: 2021-12-18 | Discharge: 2021-12-18 | Disposition: A | Discharge status: Home / Self Care | Payer: 59 | Source: Ambulatory Visit | Attending: Family Medicine | Admitting: Family Medicine

## 2021-12-18 ENCOUNTER — Ambulatory Visit (INDEPENDENT_AMBULATORY_CARE_PROVIDER_SITE_OTHER): Payer: 59 | Admitting: Family Medicine

## 2021-12-18 ENCOUNTER — Encounter: Payer: Self-pay | Admitting: Family Medicine

## 2021-12-18 ENCOUNTER — Other Ambulatory Visit: Payer: Self-pay

## 2021-12-18 VITALS — BP 133/59 | HR 72 | Ht 65.0 in | Wt 244.0 lb

## 2021-12-18 DIAGNOSIS — Z01419 Encounter for gynecological examination (general) (routine) without abnormal findings: Secondary | ICD-10-CM | POA: Diagnosis not present

## 2021-12-18 NOTE — Progress Notes (Signed)
Patient had mammogram Dec 2022 with [place]. Kathrene Alu, RN  ?

## 2021-12-18 NOTE — Progress Notes (Signed)
? ? ?GYNECOLOGY ANNUAL PREVENTATIVE CARE ENCOUNTER NOTE ? ?Subjective:  ? [name] [name] is a 61 y.o. G0P0000 female here for a routine annual gynecologic exam.  Current complaints: none.   Denies abnormal vaginal bleeding, discharge, pelvic pain, problems with intercourse or other gynecologic concerns.  ?  ?Gynecologic History ?No LMP recorded. Patient is postmenopausal. ?Patient is not sexually active  ?Contraception: post menopausal status ?Last Pap: unsure. Results were: normal ?Last mammogram: 08/2021. Results were: birads 1 ? ? ?The pregnancy intention screening data noted above was reviewed. Potential methods of contraception were discussed. The patient elected to proceed with No data recorded. ? ? ?Obstetric History ?OB History  ?Gravida Para Term Preterm AB Living  ?0 0 0 0 0 0  ?SAB IAB Ectopic Multiple Live Births  ?0 0 0 0 0  ? ? ?Past Medical History:  ?Diagnosis Date  ? Acute foot pain, right 01/13/2017  ? Acute upper respiratory infections of unspecified site 06/12/2014  ? Anxiety and depression   ? chronic  ? Breast cancer screening 09/05/2016  ? Cervical cancer screening 09/05/2016  ? Chronic back pain   ? Depression with anxiety 08/23/2008  ? Qualifier: Diagnosis of  By: Niel Hummer MD, Lorinda Creed   ? Gout 05/05/2017  ? History of colon polyps   ? History of fibromyalgia   ? suspected   ? Hypertension   ? Insomnia   ? Knee pain, right 06/24/2015  ? Meningitis spinal   ? Morbid obesity ([place])   ? Ovarian cyst, bilateral   ? Preventative health care 06/24/2015  ? PTSD (post-traumatic stress disorder)   ? Vitamin D deficiency 06/24/2015  ? ? ?Past Surgical History:  ?Procedure Laterality Date  ? COLONOSCOPY    ? OVARIAN CYST REMOVAL    ? ? ?Current Outpatient Medications on File Prior to Visit  ?Medication Sig Dispense Refill  ? allopurinol (ZYLOPRIM) 100 MG tablet TAKE ONE TABLET BY MOUTH ONE TIME DAILY 30 tablet 6  ? ALPRAZolam (XANAX) 0.25 MG tablet Take 1 tablet (0.25 mg total) by mouth 2 (two) times daily as  needed for anxiety. 5 tablet 1  ? atorvastatin (LIPITOR) 10 MG tablet TAKE ONE TABLET BY MOUTH ONE TIME DAILY 90 tablet 0  ? atorvastatin (LIPITOR) 20 MG tablet Take 1 tablet (20 mg total) by mouth daily. 90 tablet 1  ? beclomethasone (BECONASE-AQ) 42 MCG/SPRAY nasal spray Place 1 spray into both nostrils 2 (two) times daily. Dose is for each nostril. 25 g 1  ? colchicine 0.6 MG tablet 2 tabs po once then 1 tab po q 2 hours prn pain til pain gone, max of 6 tabs in 24 hours or intolerable diarrhea 6 tablet 2  ? cyclobenzaprine (FLEXERIL) 10 MG tablet Take 0.5-1 tablets (5-10 mg total) by mouth 2 (two) times daily as needed for muscle spasms. 40 tablet 2  ? fentaNYL (DURAGESIC) 25 MCG/HR Place 1 patch onto the skin every 3 (three) days. 10 patch 0  ? FLUoxetine (PROZAC) 10 MG capsule Take 1 capsule (10 mg total) by mouth daily. 30 capsule 2  ? lisinopril (ZESTRIL) 10 MG tablet TAKE ONE TABLET BY MOUTH ONE TIME DAILY 90 tablet 0  ? methylPREDNISolone (MEDROL) 4 MG tablet 5 tabs po x 1 day then 4 tabs po x 1 day then 3 tabs po x 1 day then 2 tabs po x 1 day then 1 tab po x 1 day and stop 15 tablet 0  ? metoprolol succinate (TOPROL-XL) 25  MG 24 hr tablet TAKE ONE TABLET BY MOUTH ONE TIME DAILY 90 tablet 0  ? Oxycodone HCl 20 MG TABS Take 1 tablet (20 mg total) by mouth 4 (four) times daily as needed. 120 tablet 0  ? traZODone (DESYREL) 100 MG tablet TAKE ONE TABLET BY MOUTH AT BEDTIME AS NEEDED FOR SLEEP 30 tablet 2  ? Semaglutide-Weight Management (WEGOVY) 0.25 MG/0.5ML SOAJ Inject 0.25 mg into the skin once a week. (Patient not taking: Reported on 12/18/2021) 2 mL 0  ? ?No current facility-administered medications on file prior to visit.  ? ? ?Allergies  ?Allergen Reactions  ? Cymbalta [Duloxetine Hcl] Other (See Comments)  ?  Headaches  ? Fiorinal [Butalbital-Aspirin-Caffeine] Other (See Comments)  ?  Mental Clarity.  ? ? ?Social History  ? ?Socioeconomic History  ? Marital status: Divorced  ?  Spouse name: Not on file   ? Number of children: Not on file  ? Years of education: Not on file  ? Highest education level: Not on file  ?Occupational History  ? Not on file  ?Tobacco Use  ? Smoking status: Former  ?  Types: Cigarettes  ?  Quit date: 04/04/1989  ?  Years since quitting: 32.7  ? Smokeless tobacco: Never  ?Vaping Use  ? Vaping Use: Never used  ?Substance and Sexual Activity  ? Alcohol use: Yes  ?  Comment: occassionally- 2 glassses of wine 2x/month  ? Drug use: No  ?  Comment: take meds as prescribed  ? Sexual activity: Not Currently  ?Other Topics Concern  ? Not on file  ?Social History Narrative  ? Not on file  ? ?Social Determinants of Health  ? ?Financial Resource Strain: Not on file  ?Food Insecurity: Not on file  ?Transportation Needs: Not on file  ?Physical Activity: Not on file  ?Stress: Not on file  ?Social Connections: Not on file  ?Intimate Partner Violence: Not on file  ? ? ?Family History  ?Problem Relation Age of Onset  ? Rheum arthritis Father   ? Diabetes Father   ? Congestive Heart Failure Father   ? Hypertension Father   ? Stroke Father   ? Cancer Maternal Uncle 62  ?     colon  ? Hip fracture Maternal Grandmother   ? COPD Maternal Grandfather 42  ?     colon  ? Colon cancer Maternal Grandfather   ? Lymphoma Paternal Grandmother   ? Colon cancer Mother   ? Breast cancer Mother   ?     benign  ? Colon cancer Maternal Aunt   ? Breast cancer Maternal Great-grandmother   ? Breast cancer Cousin   ?     negative stage 3  ? ADD / ADHD Neg Hx   ? Depression Neg Hx   ? Alcohol abuse Neg Hx   ? Drug abuse Neg Hx   ? Esophageal cancer Neg Hx   ? ? ?The following portions of the patient's history were reviewed and updated as appropriate: allergies, current medications, past family history, past medical history, past social history, past surgical history and problem list. ? ?Review of Systems ?Pertinent items are noted in HPI. ?  ?Objective:  ?BP (!) 133/59   Pulse 72   Ht '5\' 5"'$  (1.651 m)   Wt 244 lb (110.7 kg)   BMI  40.60 kg/m?  ?Wt Readings from Last 3 Encounters:  ?12/18/21 244 lb (110.7 kg)  ?10/15/21 241 lb 9.6 oz (109.6 kg)  ?03/04/21 217 lb (98.4 kg)  ?  ? ?  Chaperone present during exam ? ?CONSTITUTIONAL: Well-developed, well-nourished female in no acute distress.  ?HENT:  Normocephalic, atraumatic, External right and left ear normal. Oropharynx is clear and moist ?EYES: Conjunctivae and EOM are normal. Pupils are equal, round, and reactive to light. No scleral icterus.  ?NECK: Normal range of motion, supple, no masses.  Normal thyroid.  ? ?CARDIOVASCULAR: Normal heart rate noted, regular rhythm ?RESPIRATORY: Clear to auscultation bilaterally. Effort and breath sounds normal, no problems with respiration noted. ?BREASTS: Symmetric in size. No masses, skin changes, nipple drainage, or lymphadenopathy. ?ABDOMEN: Soft, normal bowel sounds, no distention noted.  No tenderness, rebound or guarding.  ?PELVIC: Normal appearing external genitalia; normal appearing vaginal mucosa and cervix.  No abnormal discharge noted.  Normal uterine size, no other palpable masses, no uterine or adnexal tenderness. ?MUSCULOSKELETAL: Normal range of motion. No tenderness.  No cyanosis, clubbing, or edema.  2+ distal pulses. ?SKIN: Skin is warm and dry. No rash noted. Not diaphoretic. No erythema. No pallor. ?NEUROLOGIC: Alert and oriented to person, place, and time. Normal reflexes, muscle tone coordination. No cranial nerve deficit noted. ?PSYCHIATRIC: Normal mood and affect. Normal behavior. Normal judgment and thought content. ? ?Assessment:  ?Annual gynecologic examination with pap smear ?  ?Plan:  ?1. Well Woman Exam ?Will follow up results of pap smear and manage accordingly. ? ? ?Routine preventative health maintenance measures emphasized. ?Please refer to After Visit Summary for other counseling recommendations.  ? ? ?Loma Boston, DO ?[facility] ? ?

## 2021-12-23 LAB — CYTOLOGY - PAP
Adequacy: ABSENT
Comment: NEGATIVE
Diagnosis: NEGATIVE
High risk HPV: NEGATIVE

## 2022-01-09 ENCOUNTER — Other Ambulatory Visit: Payer: Self-pay | Admitting: Family Medicine

## 2022-01-10 MED ORDER — FENTANYL 25 MCG/HR TD PT72: 1.0000 | MEDICATED_PATCH | TRANSDERMAL | 0 refills | Status: DC

## 2022-01-10 MED ORDER — OXYCODONE HCL 20 MG PO TABS: 20.0000 mg | ORAL_TABLET | Freq: Four times a day (QID) | ORAL | 0 refills | Status: DC | PRN

## 2022-01-10 NOTE — Telephone Encounter (Signed)
Requesting: fentanyl 25 mcg/hr and oxycodone hci 20 mg ?Contract:03/04/21 ?UDS:03/04/21 ?Last Visit:10/15/21 ?Next Visit:02/10/22 ?Last Refill:12/12/21 ? ?Please Advise  ?

## 2022-01-13 ENCOUNTER — Other Ambulatory Visit: Payer: Self-pay | Admitting: Family Medicine

## 2022-02-06 ENCOUNTER — Other Ambulatory Visit: Payer: Self-pay | Admitting: Family Medicine

## 2022-02-06 MED ORDER — FENTANYL 25 MCG/HR TD PT72: 1.0000 | MEDICATED_PATCH | TRANSDERMAL | 0 refills | Status: DC

## 2022-02-06 MED ORDER — OXYCODONE HCL 20 MG PO TABS: 20.0000 mg | ORAL_TABLET | Freq: Four times a day (QID) | ORAL | 0 refills | Status: DC | PRN

## 2022-02-06 NOTE — Telephone Encounter (Signed)
Requesting: Fentanyl 46mg/hr and oxycodone '20mg'$   ?Contract: 03/04/21 ?UDS: 03/04/21 ?Last Visit: 10/15/21 ?Next Visit: 02/10/22 w/ TLovena Le?Last Refill on Fentanyl: 01/10/22 #10 and 0RF ?Last Refill on oxycodone ;01/10/22 #120 and 0RF ? ?Please Advise ? ?

## 2022-02-10 ENCOUNTER — Ambulatory Visit: Payer: 59 | Admitting: Family Medicine

## 2022-02-12 ENCOUNTER — Other Ambulatory Visit: Payer: Self-pay | Admitting: Family Medicine

## 2022-02-13 ENCOUNTER — Other Ambulatory Visit: Payer: Self-pay | Admitting: Family Medicine

## 2022-03-03 ENCOUNTER — Encounter: Payer: Self-pay | Admitting: Family Medicine

## 2022-03-03 ENCOUNTER — Ambulatory Visit (INDEPENDENT_AMBULATORY_CARE_PROVIDER_SITE_OTHER): Payer: 59 | Admitting: Family Medicine

## 2022-03-03 VITALS — BP 122/70 | HR 75 | Resp 20 | Ht 65.0 in | Wt 255.0 lb

## 2022-03-03 DIAGNOSIS — M5416 Radiculopathy, lumbar region: Secondary | ICD-10-CM

## 2022-03-03 DIAGNOSIS — E785 Hyperlipidemia, unspecified: Secondary | ICD-10-CM

## 2022-03-03 DIAGNOSIS — Z79899 Other long term (current) drug therapy: Secondary | ICD-10-CM

## 2022-03-03 NOTE — Progress Notes (Signed)
   Established Patient Office Visit  Subjective   Patient ID: [name] [name], female    DOB: 1961/04/12  Age: 61 y.o. MRN: [id number]  Chief Complaint  Patient presents with   Follow-up    HPI Patient reports for 4-monthfollow-up on HLD and chronic pain.  HYPERLIPIDEMIA - medications: Lipitor ; at last visit in January, lipids were elevated and atorvastatin was increased to 20 mg daily - compliance: good - medication SEs: no The 10-year ASCVD risk score (Arnett DK, et al., 2019) is: 3.6%   Values used to calculate the score:     Age: [age]years     Sex: Female     Is Non-Hispanic African American: No     Diabetic: No     Tobacco smoker: No     Systolic Blood Pressure: 1563mmHg     Is BP treated: Yes     HDL Cholesterol: 69.3 mg/dL     Total Cholesterol: 211 mg/dL   Chronic pain - lower back pain/radiculopathy: - intermittent sciatica and neuropathy (reports previous nerve conduction study) - flexeril 10 mg PRN - averages about once a day or 3-4x/week - fentanyl patches 25 mcg/hr every 3 days  - oxycodone 20 mg QID PRN - regimen seems to keep pain under control, but still has rough days  - reports some occasional constipation but manages well with stool softeners; otherwise no adverse effects    Anxiety: - states she has PTSD/anxiety, but has been doing really well lately  - has not been needing any Xanax awhile  - No depression, SI/HI       ROS All review of systems negative except what is listed in the HPI    Objective:     BP 122/70 (BP Location: Left Arm, Patient Position: Sitting, Cuff Size: Normal)   Pulse 75   Resp 20   Ht '5\' 5"'$  (1.651 m)   Wt 255 lb (115.7 kg)   SpO2 95%   BMI 42.43 kg/m    Physical Exam Vitals reviewed.  Constitutional:      Appearance: Normal appearance. She is obese.  Cardiovascular:     Rate and Rhythm: Normal rate and regular rhythm.  Pulmonary:     Effort: Pulmonary effort is normal.     Breath sounds: Normal breath  sounds.  Neurological:     General: No focal deficit present.     Mental Status: She is alert and oriented to person, place, and time. Mental status is at baseline.  Psychiatric:        Mood and Affect: Mood normal.        Behavior: Behavior normal.        Thought Content: Thought content normal.        Judgment: Judgment normal.     No results found for any visits on 03/03/22.    The 10-year ASCVD risk score (Arnett DK, et al., 2019) is: 3.6%    Assessment & Plan:   1. High risk medication use - Drug Monitoring Panel [id number], Urine  2. Hyperlipidemia, unspecified hyperlipidemia type She has been tolerating lipitor well. Encouraged heart healthy diet and regular physical activity. - Lipid panel  3. Lumbar radiculopathy UDS and contract updated today. States she does not need any refills at this time. PDMP reviewed. No new concerns today.  - Drug Monitoring Panel [id number], Urine   Return in about 3 months (around 06/03/2022) for routine f/u PCP.    TTerrilyn Saver NP

## 2022-03-04 ENCOUNTER — Encounter: Payer: Self-pay | Admitting: *Deleted

## 2022-03-04 LAB — LIPID PANEL
Cholesterol: 176 mg/dL (ref 0–200)
HDL: 54.4 mg/dL (ref >39.00)
Total CHOL/HDL Ratio: 3
Triglycerides: 517 mg/dL — ABNORMAL HIGH (ref 0.0–149.0)

## 2022-03-04 LAB — LDL CHOLESTEROL, DIRECT: Direct LDL: 62 mg/dL

## 2022-03-04 NOTE — Addendum Note (Signed)
Addended by: Caleen Jobs B on: 03/04/2022 12:29 PM   Modules accepted: Orders

## 2022-03-06 LAB — DRUG MONITORING PANEL 376104, URINE
Amphetamines: NEGATIVE ng/mL (ref <500)
Barbiturates: NEGATIVE ng/mL (ref <300)
Benzodiazepines: NEGATIVE ng/mL (ref <100)
Cocaine Metabolite: NEGATIVE ng/mL (ref <150)
Codeine: NEGATIVE ng/mL (ref <50)
Desmethyltramadol: NEGATIVE ng/mL (ref <100)
Hydrocodone: NEGATIVE ng/mL (ref <50)
Hydromorphone: NEGATIVE ng/mL (ref <50)
Morphine: NEGATIVE ng/mL (ref <50)
Norhydrocodone: NEGATIVE ng/mL (ref <50)
Noroxycodone: 4916 ng/mL — ABNORMAL HIGH (ref <50)
Opiates: NEGATIVE ng/mL (ref <100)
Oxycodone: 353 ng/mL — ABNORMAL HIGH (ref <50)
Oxycodone: POSITIVE ng/mL — AB (ref <100)
Oxymorphone: 347 ng/mL — ABNORMAL HIGH (ref <50)
Tramadol: NEGATIVE ng/mL (ref <100)

## 2022-03-06 LAB — DM TEMPLATE

## 2022-03-07 ENCOUNTER — Other Ambulatory Visit: Payer: Self-pay | Admitting: Family Medicine

## 2022-03-10 MED ORDER — OXYCODONE HCL 20 MG PO TABS: 20.0000 mg | ORAL_TABLET | Freq: Four times a day (QID) | ORAL | 0 refills | Status: DC | PRN

## 2022-03-10 MED ORDER — FENTANYL 25 MCG/HR TD PT72: 1.0000 | MEDICATED_PATCH | TRANSDERMAL | 0 refills | Status: DC

## 2022-03-10 NOTE — Telephone Encounter (Signed)
Requesting: Fentanyl 29mg/hr and oxycodone '20mg'$   Contract: 03/03/22 UDS: 03/03/22 Last Visit: 03/03/22 Next Visit: 08/18/22 Last Refill on Fentanyl: 02/06/22 #10 and 0RF Last Refill on oxycodone ;02/06/22 #120 and 0RF

## 2022-03-14 ENCOUNTER — Other Ambulatory Visit: Payer: Self-pay | Admitting: Family Medicine

## 2022-04-04 ENCOUNTER — Other Ambulatory Visit: Payer: Self-pay | Admitting: Family Medicine

## 2022-04-07 ENCOUNTER — Other Ambulatory Visit: Payer: Self-pay | Admitting: Family Medicine

## 2022-04-07 MED ORDER — OXYCODONE HCL 20 MG PO TABS: 20.0000 mg | ORAL_TABLET | Freq: Four times a day (QID) | ORAL | 0 refills | Status: DC | PRN

## 2022-04-07 MED ORDER — FENTANYL 25 MCG/HR TD PT72: 1.0000 | MEDICATED_PATCH | TRANSDERMAL | 0 refills | Status: DC

## 2022-04-07 NOTE — Telephone Encounter (Signed)
Requesting:fentanyl 25 mcg and oxycodone hci 20 mg Contract:03/03/22 UDS:03/03/22 Last Visit:03/03/22 Next Visit:08/18/22 Last Refill:03/10/22  Please Advise

## 2022-04-13 ENCOUNTER — Other Ambulatory Visit: Payer: Self-pay | Admitting: Family Medicine

## 2022-05-05 ENCOUNTER — Other Ambulatory Visit: Payer: Self-pay | Admitting: Family Medicine

## 2022-05-05 MED ORDER — FENTANYL 25 MCG/HR TD PT72: 1.0000 | MEDICATED_PATCH | TRANSDERMAL | 0 refills | Status: DC

## 2022-05-05 MED ORDER — OXYCODONE HCL 20 MG PO TABS: 20.0000 mg | ORAL_TABLET | Freq: Four times a day (QID) | ORAL | 0 refills | Status: DC | PRN

## 2022-05-05 NOTE — Telephone Encounter (Signed)
Requesting: fentanyl 75mg/hr and oxycodone '20mg'$   Contract: 03/03/22 UDS: 03/03/22 Last Visit: 03/03/22 w/ TLovena LeNext Visit: 08/18/22 Last Refill on fentanyl: 04/07/22 #10 and 0RF Last Refill on oxycodone: 04/07/22 #120 and 0RF  Please Advise

## 2022-05-06 ENCOUNTER — Telehealth: Payer: Self-pay

## 2022-05-06 NOTE — Telephone Encounter (Signed)
PA approved. Effective 05/06/22 to 11/06/22.

## 2022-05-06 NOTE — Telephone Encounter (Signed)
PA initiated via Covermymeds; KEY: [id number]. Awaiting determination.

## 2022-05-13 ENCOUNTER — Other Ambulatory Visit: Payer: Self-pay | Admitting: Family Medicine

## 2022-05-29 ENCOUNTER — Telehealth: Payer: Self-pay | Admitting: Family Medicine

## 2022-05-29 ENCOUNTER — Other Ambulatory Visit: Payer: Self-pay | Admitting: Family Medicine

## 2022-05-29 MED ORDER — FENTANYL 25 MCG/HR TD PT72
1.0000 | MEDICATED_PATCH | TRANSDERMAL | 0 refills | Status: DC
Start: 2022-05-29 — End: 2022-06-30

## 2022-05-29 MED ORDER — OXYCODONE HCL 20 MG PO TABS: 20.0000 mg | ORAL_TABLET | Freq: Four times a day (QID) | ORAL | 0 refills | Status: DC | PRN

## 2022-05-29 NOTE — Telephone Encounter (Signed)
Medication: fentaNYL (DURAGESIC) 25 MCG/HR  Oxycodone HCl 20 MG TABS  Has the patient contacted their pharmacy? No.  Preferred Pharmacy:  [facility] [id number] - [place], [place] - [address] [place] [address]   [address], [place] [address]  Phone:  [phone]  Fax:  [phone]

## 2022-06-12 ENCOUNTER — Other Ambulatory Visit: Payer: Self-pay | Admitting: Family Medicine

## 2022-06-20 ENCOUNTER — Telehealth: Payer: Self-pay | Admitting: Family Medicine

## 2022-06-20 NOTE — Telephone Encounter (Signed)
Patient states her insurance is requesting a PA for her fentaNYL (DURAGESIC) 25 MCG/HR. Please advise.

## 2022-06-23 NOTE — Telephone Encounter (Signed)
Pa was approved

## 2022-06-30 ENCOUNTER — Other Ambulatory Visit: Payer: Self-pay | Admitting: Family Medicine

## 2022-07-01 ENCOUNTER — Telehealth: Payer: Self-pay | Admitting: *Deleted

## 2022-07-01 MED ORDER — FENTANYL 25 MCG/HR TD PT72
1.0000 | MEDICATED_PATCH | TRANSDERMAL | 0 refills | Status: DC
Start: 2022-07-01 — End: 2022-07-25

## 2022-07-01 MED ORDER — OXYCODONE HCL 20 MG PO TABS: 20.0000 mg | ORAL_TABLET | Freq: Four times a day (QID) | ORAL | 0 refills | Status: DC | PRN

## 2022-07-01 NOTE — Telephone Encounter (Signed)
Requesting: fentanyl and oxycodone Contract:03/03/22 UDS:03/03/22 Last Visit:03/03/22 Next Visit:08/18/22 Last Refill on fentanyl: 05/29/22 # 10 and 0RF Last Refill on oxycodone: 05/29/22 #120 and 0RF   Please Advise

## 2022-07-01 NOTE — Telephone Encounter (Signed)
Prior auth started via cover my meds.  Awaiting determination.   Key: [id number]

## 2022-07-04 NOTE — Telephone Encounter (Signed)
Prior auth denied. 

## 2022-07-25 ENCOUNTER — Other Ambulatory Visit: Payer: Self-pay | Admitting: Family Medicine

## 2022-07-28 MED ORDER — OXYCODONE HCL 20 MG PO TABS: 20.0000 mg | ORAL_TABLET | Freq: Four times a day (QID) | ORAL | 0 refills | Status: DC | PRN

## 2022-07-28 MED ORDER — FENTANYL 25 MCG/HR TD PT72: 1.0000 | MEDICATED_PATCH | TRANSDERMAL | 0 refills | Status: DC

## 2022-07-28 NOTE — Telephone Encounter (Signed)
Requesting: fentanyl 52mg/hr and oxycodone '20mg'$   Contract: 03/03/22 UDS: 03/03/22 Last Visit: 03/03/22 Next Visit: 08/18/22 Last Refill on fentanyl: 07/01/22 #10 and 0RF  Last Refill on oxycodone: 07/01/22 #120 and 0RF   Please Advise

## 2022-08-17 NOTE — Assessment & Plan Note (Signed)
Hydrate and monitor 

## 2022-08-17 NOTE — Assessment & Plan Note (Signed)
Well controlled, no changes to meds. Encouraged heart healthy diet such as the DASH diet and exercise as tolerated.  °

## 2022-08-17 NOTE — Assessment & Plan Note (Signed)
On Levothyroxine, continue to monitor 

## 2022-08-17 NOTE — Assessment & Plan Note (Signed)
Supplement and monitor 

## 2022-08-17 NOTE — Assessment & Plan Note (Signed)
Encouraged moist heat and gentle stretching as tolerated. May try NSAIDs and prescription meds as directed and report if symptoms worsen or seek immediate care 

## 2022-08-17 NOTE — Assessment & Plan Note (Signed)
Encouraged DASH or MIND diet, decrease po intake and increase exercise as tolerated. Needs 7-8 hours of sleep nightly. Avoid trans fats, eat small, frequent meals every 4-5 hours with lean proteins, complex carbs and healthy fats. Minimize simple carbs, high fat foods and processed foods 

## 2022-08-17 NOTE — Assessment & Plan Note (Signed)
hgba1c acceptable, minimize simple carbs. Increase exercise as tolerated.  

## 2022-08-17 NOTE — Assessment & Plan Note (Signed)
Patient encouraged to maintain heart healthy diet, regular exercise, adequate sleep. Consider daily probiotics. Take medications as prescribed  Labs ordered and reviewed  Colonoscopy 2021 repeat in 3-5 years Pap [facility] July 2021 Consider Dexa scan Consider covid and flu boosters Shingrix is the new shingles shot, 2 shots over 2-6 months, confirm coverage with insurance and document, then can return here for shots with nurse appt or at pharmacy

## 2022-08-18 ENCOUNTER — Ambulatory Visit (INDEPENDENT_AMBULATORY_CARE_PROVIDER_SITE_OTHER): Payer: Commercial Managed Care - HMO | Admitting: Family Medicine

## 2022-08-18 VITALS — BP 115/74 | HR 71 | Temp 97.7°F | Resp 16 | Ht 65.0 in | Wt 260.0 lb

## 2022-08-18 DIAGNOSIS — R739 Hyperglycemia, unspecified: Secondary | ICD-10-CM

## 2022-08-18 DIAGNOSIS — B379 Candidiasis, unspecified: Secondary | ICD-10-CM | POA: Insufficient documentation

## 2022-08-18 DIAGNOSIS — E559 Vitamin D deficiency, unspecified: Secondary | ICD-10-CM | POA: Diagnosis not present

## 2022-08-18 DIAGNOSIS — Z23 Encounter for immunization: Secondary | ICD-10-CM | POA: Diagnosis not present

## 2022-08-18 DIAGNOSIS — Z0000 Encounter for general adult medical examination without abnormal findings: Secondary | ICD-10-CM | POA: Diagnosis not present

## 2022-08-18 DIAGNOSIS — M5416 Radiculopathy, lumbar region: Secondary | ICD-10-CM | POA: Diagnosis not present

## 2022-08-18 DIAGNOSIS — I10 Essential (primary) hypertension: Secondary | ICD-10-CM

## 2022-08-18 DIAGNOSIS — Z780 Asymptomatic menopausal state: Secondary | ICD-10-CM | POA: Diagnosis not present

## 2022-08-18 DIAGNOSIS — E669 Obesity, unspecified: Secondary | ICD-10-CM

## 2022-08-18 DIAGNOSIS — M1A9XX0 Chronic gout, unspecified, without tophus (tophi): Secondary | ICD-10-CM | POA: Diagnosis not present

## 2022-08-18 DIAGNOSIS — E2839 Other primary ovarian failure: Secondary | ICD-10-CM

## 2022-08-18 DIAGNOSIS — E039 Hypothyroidism, unspecified: Secondary | ICD-10-CM

## 2022-08-18 DIAGNOSIS — E785 Hyperlipidemia, unspecified: Secondary | ICD-10-CM | POA: Diagnosis not present

## 2022-08-18 MED ORDER — LISINOPRIL 10 MG PO TABS: 10.0000 mg | ORAL_TABLET | Freq: Every day | ORAL | 1 refills | Status: DC

## 2022-08-18 MED ORDER — FENTANYL 25 MCG/HR TD PT72: 1.0000 | MEDICATED_PATCH | TRANSDERMAL | 0 refills | Status: DC

## 2022-08-18 MED ORDER — OXYCODONE HCL 20 MG PO TABS: 20.0000 mg | ORAL_TABLET | Freq: Four times a day (QID) | ORAL | 0 refills | Status: DC | PRN

## 2022-08-18 MED ORDER — NYSTATIN 100000 UNIT/GM EX CREA: 1.0000 | TOPICAL_CREAM | Freq: Two times a day (BID) | CUTANEOUS | Status: DC

## 2022-08-18 NOTE — Progress Notes (Signed)
Subjective:   By signing my name below, I, Carylon Perches, attest that this documentation has been prepared under the direction and in the presence of Willette Alma MD, 08/18/2022   Patient ID: [name] [name], female    DOB: July 03, 1961, 61 y.o.   MRN: [id number]  Chief Complaint  Patient presents with   Annual Exam    Annual exam    HPI Patient is in today for a comprehensive physical exam  Refills She is requesting a refill of 10 mg of Lisinopril, 20 mg of Oxycodone HCl and 25 mcg/hr of Fentanyl  Chest Discomfort/Pressure She reports of persistent chronic chest pressure/discomfort. She states that it feels as though her hear muscles are strained. She states that symptoms usually appear for about a week. She states that her last episode was in July. When she deeply inhales, symptoms worsen. Symptoms waxes and wanes. She has previously been seen by a cardiologist, Dr.Choi in 08/30/2021.  An EKG was previously done.  Weight Her insurance does not cover Wegovy. She is not interested in undergoing surgery. She is inquiring about [place]. [facility] is [facility] to [facility]  Wt Readings from Last 3 Encounters:  08/18/22 260 lb (117.9 kg)  03/03/22 255 lb (115.7 kg)  12/18/21 244 lb (110.7 kg)   Rash She complains of a rash underneath her left breast. She states that the area is improving. She denies of any significant pain in the area.   She denies having any fever, new muscle pain, joint pain , new moles, congestion, sinus pain, sore throat, chest pain, palpations, cough, SOB ,wheezing,n/v/d constipation, blood in stool, dysuria, frequency, hematuria, at this time  She denies of any changes to her family history. She reports that she has not completed a living will or power of attorney.  Colonoscopy was last completed on 12/09/2019. She reports that she is due for a colonoscopy in 2024. She has not received a Dexa imaging. She was taking Vitamin 200 units of D3  supplements but has since discontinued. She is interested in being referred for a dexa image.  Pap Smear was last completed on 12/18/2021 Mammogram was last completed on 04/05/2020. She states that she gets her imaging done in [facility] She is interested in receiving an influenza vaccine during today's visit. She states that she has not gotten around to receiving the Shingles vaccine. She is not interested in receiving the updated Covid vaccine.  Past Medical History:  Diagnosis Date   Acute foot pain, right 01/13/2017   Acute upper respiratory infections of unspecified site 06/12/2014   Anxiety and depression    chronic   Breast cancer screening 09/05/2016   Cervical cancer screening 09/05/2016   Chronic back pain    Depression with anxiety 08/23/2008   Qualifier: Diagnosis of  By: Niel Hummer MD, Lorinda Creed    Gout 05/05/2017   History of colon polyps    History of fibromyalgia    suspected    Hypertension    Insomnia    Knee pain, right 06/24/2015   Meningitis spinal    Morbid obesity ([place])    Ovarian cyst, bilateral    Preventative health care 06/24/2015   PTSD (post-traumatic stress disorder)    Vitamin D deficiency 06/24/2015    Past Surgical History:  Procedure Laterality Date   COLONOSCOPY     OVARIAN CYST REMOVAL      Family History  Problem Relation Age of Onset   Rheum arthritis Father  Diabetes Father    Congestive Heart Failure Father    Hypertension Father    Stroke Father    Cancer Maternal Uncle 90       colon   Hip fracture Maternal Grandmother    COPD Maternal Grandfather 87       colon   Colon cancer Maternal Grandfather    Lymphoma Paternal Grandmother    Colon cancer Mother    Breast cancer Mother        benign   Colon cancer Maternal Aunt    Breast cancer Maternal Great-grandmother    Breast cancer Cousin        negative stage 3   ADD / ADHD Neg Hx    Depression Neg Hx    Alcohol abuse Neg Hx    Drug abuse Neg Hx    Esophageal cancer Neg Hx      Social History   Socioeconomic History   Marital status: Divorced    Spouse name: Not on file   Number of children: Not on file   Years of education: Not on file   Highest education level: Not on file  Occupational History   Not on file  Tobacco Use   Smoking status: Former    Types: Cigarettes    Quit date: 04/04/1989    Years since quitting: 33.3   Smokeless tobacco: Never  Vaping Use   Vaping Use: Never used  Substance and Sexual Activity   Alcohol use: Yes    Comment: occassionally- 2 glassses of wine 2x/month   Drug use: No    Comment: take meds as prescribed   Sexual activity: Not Currently  Other Topics Concern   Not on file  Social History Narrative   Not on file   Social Determinants of Health   Financial Resource Strain: Not on file  Food Insecurity: Not on file  Transportation Needs: Not on file  Physical Activity: Not on file  Stress: Not on file  Social Connections: Not on file  Intimate Partner Violence: Not on file    Outpatient Medications Prior to Visit  Medication Sig Dispense Refill   allopurinol (ZYLOPRIM) 100 MG tablet TAKE ONE TABLET BY MOUTH ONE TIME DAILY 30 tablet 6   ALPRAZolam (XANAX) 0.25 MG tablet Take 1 tablet (0.25 mg total) by mouth 2 (two) times daily as needed for anxiety. 5 tablet 1   atorvastatin (LIPITOR) 20 MG tablet TAKE ONE TABLET BY MOUTH ONE TIME DAILY 90 tablet 1   beclomethasone (BECONASE-AQ) 42 MCG/SPRAY nasal spray Place 1 spray into both nostrils 2 (two) times daily. Dose is for each nostril. 25 g 1   colchicine 0.6 MG tablet TAKE 2 TABLETS BY MOUTH ONCE THEN 1 TABLET BY MOUTH EVERY 2 HOURS AS NEEDED PAIN TIL PAIN GONE, MAXIMUM OF 6 TABLETS IN 24 HOURS OR INTOLERABLE DIARRHEA 6 tablet 2   cyclobenzaprine (FLEXERIL) 10 MG tablet TAKE ONE-HALF TO ONE TABLET BY MOUTH TWICE DAILY AS NEEDED FOR MUSCLE SPASMS 40 tablet 2   FLUoxetine (PROZAC) 10 MG capsule TAKE ONE CAPSULE BY MOUTH ONE TIME DAILY 90 capsule 1   metoprolol  succinate (TOPROL-XL) 25 MG 24 hr tablet TAKE ONE TABLET BY MOUTH ONE TIME DAILY 90 tablet 1   Semaglutide-Weight Management (WEGOVY) 0.25 MG/0.5ML SOAJ Inject 0.25 mg into the skin once a week. 2 mL 0   traZODone (DESYREL) 100 MG tablet Take 1 tablet (100 mg total) by mouth at bedtime as needed for sleep. 30 tablet 1  fentaNYL (DURAGESIC) 25 MCG/HR Place 1 patch onto the skin every 3 (three) days. 10 patch 0   lisinopril (ZESTRIL) 10 MG tablet TAKE ONE TABLET BY MOUTH ONE TIME DAILY 90 tablet 1   methylPREDNISolone (MEDROL) 4 MG tablet TAKE 5 TABLETS BY MOUTH ON DAY 1, THEN 4 TABLETS ON DAY 2, THEN 3 TABLETS ON DAY 3, THEN 2 TABLETS ON DAY 4, THEN 1 TABLET ON DAY 5, THEN STOP. 15 tablet 0   Oxycodone HCl 20 MG TABS Take 1 tablet (20 mg total) by mouth 4 (four) times daily as needed. 120 tablet 0   No facility-administered medications prior to visit.    Allergies  Allergen Reactions   Cymbalta [Duloxetine Hcl] Other (See Comments)    Headaches   Fiorinal [Butalbital-Aspirin-Caffeine] Other (See Comments)    Mental Clarity.    Review of Systems  Constitutional:  Negative for fever.  HENT:  Negative for congestion, sinus pain and sore throat.   Respiratory:  Negative for cough, shortness of breath and wheezing.   Cardiovascular:  Negative for chest pain and palpitations.  Gastrointestinal:  Negative for blood in stool, constipation, diarrhea, nausea and vomiting.  Genitourinary:  Negative for dysuria, frequency and hematuria.  Musculoskeletal:  Negative for joint pain and myalgias.  Skin:  Positive for rash (Underneath Left Breast).       (-) New Moles       Objective:    Physical Exam Constitutional:      General: She is not in acute distress.    Appearance: Normal appearance. She is not ill-appearing.  HENT:     Head: Normocephalic and atraumatic.     Right Ear: Tympanic membrane, ear canal and external ear normal.     Left Ear: Tympanic membrane, ear canal and external ear  normal.     Mouth/Throat:     Pharynx: No oropharyngeal exudate.  Eyes:     Extraocular Movements: Extraocular movements intact.     Conjunctiva/sclera: Conjunctivae normal.     Pupils: Pupils are equal, round, and reactive to light.  Neck:     Thyroid: No thyromegaly.  Cardiovascular:     Rate and Rhythm: Normal rate and regular rhythm.     Heart sounds: Normal heart sounds. No murmur heard.    No gallop.  Pulmonary:     Effort: Pulmonary effort is normal. No respiratory distress.     Breath sounds: Normal breath sounds. No wheezing or rales.  Abdominal:     General: Bowel sounds are normal. There is no distension.     Palpations: Abdomen is soft.     Tenderness: There is no abdominal tenderness. There is no guarding.  Musculoskeletal:     Right lower leg: No edema.     Left lower leg: No edema.  Lymphadenopathy:     Cervical: No cervical adenopathy.  Skin:    General: Skin is warm and dry.     Findings: Erythema present.     Comments: Macular erythematous raised and slightly scaly, oval shaped roughly 4 inches long under left breast   Neurological:     Mental Status: She is alert and oriented to person, place, and time.     Cranial Nerves: Cranial nerves 2-12 are intact. No cranial nerve deficit.     Motor: Motor function is intact. No abnormal muscle tone.     Coordination: Coordination is intact.  Psychiatric:        Judgment: Judgment normal.    BP 115/74 (BP Location:  Right Arm, Patient Position: Sitting, Cuff Size: Normal)   Pulse 71   Temp 97.7 F (36.5 C) (Oral)   Resp 16   Ht '5\' 5"'$  (1.651 m)   Wt 260 lb (117.9 kg)   SpO2 96%   BMI 43.27 kg/m  Wt Readings from Last 3 Encounters:  08/18/22 260 lb (117.9 kg)  03/03/22 255 lb (115.7 kg)  12/18/21 244 lb (110.7 kg)    Diabetic Foot Exam - Simple   No data filed    Lab Results  Component Value Date   WBC 7.3 10/15/2021   HGB 13.3 10/15/2021   HCT 40.2 10/15/2021   PLT 175.0 10/15/2021   GLUCOSE 95  10/15/2021   CHOL 176 03/03/2022   TRIG (H) 03/03/2022    517.0 Triglyceride is over 400; calculations on Lipids are invalid.   HDL 54.40 03/03/2022   LDLDIRECT 62.0 03/03/2022   LDLCALC 101 (H) 12/12/2020   ALT 18 10/15/2021   AST 19 10/15/2021   NA 140 10/15/2021   K 4.0 10/15/2021   CL 100 10/15/2021   CREATININE 0.56 10/15/2021   BUN 17 10/15/2021   CO2 31 10/15/2021   TSH 4.16 10/15/2021   HGBA1C 5.7 10/15/2021    Lab Results  Component Value Date   TSH 4.16 10/15/2021   Lab Results  Component Value Date   WBC 7.3 10/15/2021   HGB 13.3 10/15/2021   HCT 40.2 10/15/2021   MCV 95.6 10/15/2021   PLT 175.0 10/15/2021   Lab Results  Component Value Date   NA 140 10/15/2021   K 4.0 10/15/2021   CO2 31 10/15/2021   GLUCOSE 95 10/15/2021   BUN 17 10/15/2021   CREATININE 0.56 10/15/2021   BILITOT 0.3 10/15/2021   ALKPHOS 73 10/15/2021   AST 19 10/15/2021   ALT 18 10/15/2021   PROT 6.7 10/15/2021   ALBUMIN 4.2 10/15/2021   CALCIUM 10.5 10/15/2021   GFR 99.51 10/15/2021   Lab Results  Component Value Date   CHOL 176 03/03/2022   Lab Results  Component Value Date   HDL 54.40 03/03/2022   Lab Results  Component Value Date   LDLCALC 101 (H) 12/12/2020   Lab Results  Component Value Date   TRIG (H) 03/03/2022    517.0 Triglyceride is over 400; calculations on Lipids are invalid.   Lab Results  Component Value Date   CHOLHDL 3 03/03/2022   Lab Results  Component Value Date   HGBA1C 5.7 10/15/2021       Assessment & Plan:   Problem List Items Addressed This Visit     Hypothyroidism    On Levothyroxine, continue to monitor       Hyperlipidemia    Encourage heart healthy diet such as MIND or DASH diet, increase exercise, avoid trans fats, simple carbohydrates and processed foods, consider a krill or fish or flaxseed oil cap daily.  Tolerating Atorvastatin      Relevant Medications   lisinopril (ZESTRIL) 10 MG tablet   Other Relevant Orders    Lipid panel   Obesity    Encouraged DASH or MIND diet, decrease po intake and increase exercise as tolerated. Needs 7-8 hours of sleep nightly. Avoid trans fats, eat small, frequent meals every 4-5 hours with lean proteins, complex carbs and healthy fats. Minimize simple carbs, high fat foods and processed foods       Relevant Orders   Amb Ref to [facility]   Essential hypertension    Well controlled,  no changes to meds. Encouraged heart healthy diet such as the DASH diet and exercise as tolerated.        Relevant Medications   lisinopril (ZESTRIL) 10 MG tablet   Other Relevant Orders   CBC   Comprehensive metabolic panel   TSH   Lumbar radiculopathy    Encouraged moist heat and gentle stretching as tolerated. May try NSAIDs and prescription meds as directed and report if symptoms worsen or seek immediate care       Vitamin D deficiency    Supplement and monitor       Relevant Orders   VITAMIN D 25 Hydroxy (Vit-D Deficiency, Fractures)   Preventative health care - Primary    Patient encouraged to maintain heart healthy diet, regular exercise, adequate sleep. Consider daily probiotics. Take medications as prescribed  Labs ordered and reviewed  Colonoscopy 2021 repeat in 3 in 2024 Pap 3/23 normal repeat in 3-5 years [facility] July 2021 Consider Dexa scan Consider covid and flu given today Shingrix is the new shingles shot, 2 shots over 2-6 months, confirm coverage with insurance and document, then can return here for shots with nurse appt or at pharmacy       Gout    Hydrate and monitor       Relevant Orders   Lipid panel   Uric acid   Hyperglycemia    hgba1c acceptable, minimize simple carbs. Increase exercise as tolerated.      Relevant Orders   Lipid panel   Hemoglobin A1c   Post-menopausal   Relevant Orders   DG Bone Density   Candida infection    Nystatin cream bid and notify us if no response      Relevant Medications   nystatin cream  (MYCOSTATIN)   Other Visit Diagnoses     Estrogen deficiency       Relevant Orders   DG Bone Density   Influenza vaccine administered       Relevant Orders   Flu Vaccine QUAD 36+ mos IM (Fluarix, Fluzone & Afluria Quad PF (Completed)      Meds ordered this encounter  Medications   lisinopril (ZESTRIL) 10 MG tablet    Sig: Take 1 tablet (10 mg total) by mouth daily.    Dispense:  90 tablet    Refill:  1   fentaNYL (DURAGESIC) 25 MCG/HR    Sig: Place 1 patch onto the skin every 3 (three) days.    Dispense:  10 patch    Refill:  0   Oxycodone HCl 20 MG TABS    Sig: Take 1 tablet (20 mg total) by mouth 4 (four) times daily as needed.    Dispense:  120 tablet    Refill:  0   nystatin cream (MYCOSTATIN)    Sig: Apply 1 Application topically 2 (two) times daily.    Dispense:  30 g    Refill:  02    I, Penni Homans, MD, personally preformed the services described in this documentation.  All medical record entries made by the scribe were at my direction and in my presence.  I have reviewed the chart and discharge instructions (if applicable) and agree that the record reflects my personal performance and is accurate and complete. 08/18/2022   I,Amber Collins,acting as a scribe for Penni Homans, MD.,have documented all relevant documentation on the behalf of Penni Homans, MD,as directed by  Penni Homans, MD while in the presence of Penni Homans, MD.    Penni Homans, MD

## 2022-08-18 NOTE — Assessment & Plan Note (Addendum)
Encourage heart healthy diet such as MIND or DASH diet, increase exercise, avoid trans fats, simple carbohydrates and processed foods, consider a krill or fish or flaxseed oil cap daily. Tolerating Atorvastatin 

## 2022-08-18 NOTE — Patient Instructions (Addendum)
Vitamin D3 2000 IU daily   Preventive Care 30-60 Years Old, Female Preventive care refers to lifestyle choices and visits with your health care provider that can promote health and wellness. Preventive care visits are also called wellness exams. What can I expect for my preventive care visit? Counseling Your health care provider may ask you questions about your: Medical history, including: Past medical problems. Family medical history. Pregnancy history. Current health, including: Menstrual cycle. Method of birth control. Emotional well-being. Home life and relationship well-being. Sexual activity and sexual health. Lifestyle, including: Alcohol, nicotine or tobacco, and drug use. Access to firearms. Diet, exercise, and sleep habits. Work and work Statistician. Sunscreen use. Safety issues such as seatbelt and bike helmet use. Physical exam Your health care provider will check your: Height and weight. These may be used to calculate your BMI (body mass index). BMI is a measurement that tells if you are at a healthy weight. Waist circumference. This measures the distance around your waistline. This measurement also tells if you are at a healthy weight and may help predict your risk of certain diseases, such as type 2 diabetes and high blood pressure. Heart rate and blood pressure. Body temperature. Skin for abnormal spots. What immunizations do I need?  Vaccines are usually given at various ages, according to a schedule. Your health care provider will recommend vaccines for you based on your age, medical history, and lifestyle or other factors, such as travel or where you work. What tests do I need? Screening Your health care provider may recommend screening tests for certain conditions. This may include: Lipid and cholesterol levels. Diabetes screening. This is done by checking your blood sugar (glucose) after you have not eaten for a while (fasting). Pelvic exam and Pap  test. Hepatitis B test. Hepatitis C test. HIV (human immunodeficiency virus) test. STI (sexually transmitted infection) testing, if you are at risk. Lung cancer screening. Colorectal cancer screening. Mammogram. Talk with your health care provider about when you should start having regular mammograms. This may depend on whether you have a family history of breast cancer. BRCA-related cancer screening. This may be done if you have a family history of breast, ovarian, tubal, or peritoneal cancers. Bone density scan. This is done to screen for osteoporosis. Talk with your health care provider about your test results, treatment options, and if necessary, the need for more tests. Follow these instructions at home: Eating and drinking  Eat a diet that includes fresh fruits and vegetables, whole grains, lean protein, and low-fat dairy products. Take vitamin and mineral supplements as recommended by your health care provider. Do not drink alcohol if: Your health care provider tells you not to drink. You are pregnant, may be pregnant, or are planning to become pregnant. If you drink alcohol: Limit how much you have to 0-1 drink a day. Know how much alcohol is in your drink. In the [place] one drink equals one 12 oz bottle of beer (355 mL), one 5 oz glass of wine (148 mL), or one 1 oz glass of hard liquor (44 mL). Lifestyle Brush your teeth every morning and night with fluoride toothpaste. Floss one time each day. Exercise for at least 30 minutes 5 or more days each week. Do not use any products that contain nicotine or tobacco. These products include cigarettes, chewing tobacco, and vaping devices, such as e-cigarettes. If you need help quitting, ask your health care provider. Do not use drugs. If you are sexually active, practice safe sex. Use a  condom or other form of protection to prevent STIs. If you do not wish to become pregnant, use a form of birth control. If you plan to become pregnant,  see your health care provider for a prepregnancy visit. Take aspirin only as told by your health care provider. Make sure that you understand how much to take and what form to take. Work with your health care provider to find out whether it is safe and beneficial for you to take aspirin daily. Find healthy ways to manage stress, such as: Meditation, yoga, or listening to music. Journaling. Talking to a trusted person. Spending time with friends and family. Minimize exposure to UV radiation to reduce your risk of skin cancer. Safety Always wear your seat belt while driving or riding in a vehicle. Do not drive: If you have been drinking alcohol. Do not ride with someone who has been drinking. When you are tired or distracted. While texting. If you have been using any mind-altering substances or drugs. Wear a helmet and other protective equipment during sports activities. If you have firearms in your house, make sure you follow all gun safety procedures. Seek help if you have been physically or sexually abused. What's next? Visit your health care provider once a year for an annual wellness visit. Ask your health care provider how often you should have your eyes and teeth checked. Stay up to date on all vaccines. This information is not intended to replace advice given to you by your health care provider. Make sure you discuss any questions you have with your health care provider. Document Revised: 03/13/2021 Document Reviewed: 03/13/2021 Elsevier Patient Education  [place].

## 2022-08-18 NOTE — Assessment & Plan Note (Signed)
Nystatin cream bid and notify us if no response

## 2022-08-19 LAB — LIPID PANEL
Cholesterol: 176 mg/dL (ref 0–200)
HDL: 70.1 mg/dL (ref >39.00)
NonHDL: 105.99
Total CHOL/HDL Ratio: 3
Triglycerides: 328 mg/dL — ABNORMAL HIGH (ref 0.0–149.0)
VLDL: 65.6 mg/dL — ABNORMAL HIGH (ref 0.0–40.0)

## 2022-08-19 LAB — CBC
HCT: 40.2 % (ref 36.0–46.0)
Hemoglobin: 13.3 g/dL (ref 12.0–15.0)
MCHC: 33 g/dL (ref 30.0–36.0)
MCV: 95.4 fl (ref 78.0–100.0)
Platelets: 196 10*3/uL (ref 150.0–400.0)
RBC: 4.21 Mil/uL (ref 3.87–5.11)
RDW: 13.6 % (ref 11.5–15.5)
WBC: 8.2 10*3/uL (ref 4.0–10.5)

## 2022-08-19 LAB — VITAMIN D 25 HYDROXY (VIT D DEFICIENCY, FRACTURES): VITD: 35.21 ng/mL (ref 30.00–100.00)

## 2022-08-19 LAB — COMPREHENSIVE METABOLIC PANEL
ALT: 28 U/L (ref 0–35)
AST: 27 U/L (ref 0–37)
Albumin: 4.2 g/dL (ref 3.5–5.2)
Alkaline Phosphatase: 85 U/L (ref 39–117)
BUN: 10 mg/dL (ref 6–23)
CO2: 31 mEq/L (ref 19–32)
Calcium: 10.3 mg/dL (ref 8.4–10.5)
Chloride: 99 mEq/L (ref 96–112)
Creatinine, Ser: 0.53 mg/dL (ref 0.40–1.20)
GFR: 100.24 mL/min (ref >60.00)
Glucose, Bld: 92 mg/dL (ref 70–99)
Potassium: 4.1 mEq/L (ref 3.5–5.1)
Sodium: 137 mEq/L (ref 135–145)
Total Bilirubin: 0.4 mg/dL (ref 0.2–1.2)
Total Protein: 6.4 g/dL (ref 6.0–8.3)

## 2022-08-19 LAB — HEMOGLOBIN A1C: Hgb A1c MFr Bld: 5.9 % (ref 4.6–6.5)

## 2022-08-19 LAB — TSH: TSH: 2.16 u[IU]/mL (ref 0.35–5.50)

## 2022-08-19 LAB — URIC ACID: Uric Acid, Serum: 6.3 mg/dL (ref 2.4–7.0)

## 2022-08-19 LAB — LDL CHOLESTEROL, DIRECT: Direct LDL: 68 mg/dL

## 2022-09-04 ENCOUNTER — Telehealth (HOSPITAL_BASED_OUTPATIENT_CLINIC_OR_DEPARTMENT_OTHER): Payer: Self-pay

## 2022-09-10 ENCOUNTER — Other Ambulatory Visit: Payer: Self-pay | Admitting: Family Medicine

## 2022-09-24 ENCOUNTER — Other Ambulatory Visit: Payer: Self-pay | Admitting: Family Medicine

## 2022-09-24 NOTE — Telephone Encounter (Signed)
Requesting: fentanyl and oxycodone  Contract: 03/03/22 UDS: 03/03/22 Last Visit: 08/18/22 Next Visit: 02/16/23 Last Refill on fentanyl: 08/18/22 #10 and 0RF  Last Refill on oxycodone: 08/18/22 #120 and 0RF   Please Advise

## 2022-09-25 ENCOUNTER — Other Ambulatory Visit: Payer: Self-pay | Admitting: Family Medicine

## 2022-10-10 ENCOUNTER — Other Ambulatory Visit: Payer: Self-pay | Admitting: Family Medicine

## 2022-10-15 ENCOUNTER — Other Ambulatory Visit: Payer: Self-pay | Admitting: Family Medicine

## 2022-10-16 MED ORDER — FENTANYL 25 MCG/HR TD PT72: 1.0000 | MEDICATED_PATCH | TRANSDERMAL | 0 refills | Status: DC

## 2022-10-16 MED ORDER — OXYCODONE HCL 20 MG PO TABS: 1.0000 | ORAL_TABLET | Freq: Four times a day (QID) | ORAL | 0 refills | Status: DC | PRN

## 2022-10-16 NOTE — Telephone Encounter (Signed)
Requesting: fentanyl 77mg/hr and oxycodone  Contract:03/03/22 UDS: 03/03/22 Last Visit: 08/18/22 Next Visit: 02/16/23 Last Refill: 09/24/22 #120 and 0RF    09/24/22 #10 and 0RF  Please Advise

## 2022-10-22 ENCOUNTER — Telehealth: Payer: Self-pay

## 2022-10-22 NOTE — Telephone Encounter (Signed)
PA initiated via Covermymeds; KEY: [id number]. PA approved.   [id number];Review Type:Prior Auth;Coverage Start Date:10/22/2022;Coverage End Date:10/22/2023;

## 2022-10-22 NOTE — Telephone Encounter (Signed)
PA initiated via Covermymeds; KEY: [id number]. Awaiting determination.

## 2022-10-27 NOTE — Telephone Encounter (Signed)
PA denied.   There is no indication your patient has a diagnosis of pain severe enough to require daily, aroundthe-clock, long-term opioid treatment. There is no indication that your patient is opioid tolerant [daily dosage required for pain  management exceeds 60 MME (morphine milligram equivalents)]. There is no documented indication that alternative treatment options [for example, oral (tablet,  capsule, liquid, transmucosal), suppository or transdermal opioid therapy] are ineffective, not  tolerated, or would be otherwise inadequate to provide sufficient management of pain. There is no indication that the history of controlled substance prescriptions has been checked  using the state prescription drug monitoring program (PDMP). There is no documentation to show that opioids will be prescribed in accordance with current  clinical practice guidelines. There is no documentation to show that an assessment of risks, harms, and goals consistent with  an opioid agreement (or comprehensive treatment plan) has been undertaken. There is no indication that either: 1. the prescriber of this therapy is a board certified pain  management specialist; or 2. this therapy is being prescribed in coordination with a board certified  pain management specialist. There is no documentation that the patient had a beneficial response to this medication This drug requires supportive documentation for all answers including chart notes. This  documentation was not included or was missing for some answers. Fentanyl Transdermal Patches are considered medically necessary when the individual meets one  of the following: 1. Documented diagnosis of active cancer treatment (defined as receiving  antineoplastic or antitumor therapy) requiring treatment for cancer-related pain, end-of-life care  (including hospice or palliative care), or sickle cell disease; 2. All of the following: A. Diagnosis of  pain severe enough to  require daily, around-the-clock, long-term opioid treatment; B. Failure,  contraindication, or intolerance to a minimum one week trial of immediate-release opioids; C.  Opioid tolerant [required daily dosage for pain management exceeds 60 morphine milligram  equivalents (MME)]; D. Alternative treatment options are ineffective, not tolerated, or would be  otherwise inadequate to provide sufficient management of pain; E. Attestation that the history of  controlled substance prescriptions has been checked using the state prescription drug monitoring  program (PDMP); F. Documentation that opioids will be prescribed in accordance with current  clinical practice guidelines and that an assessment of risks, harms, and goals consistent with an  opioid agreement (or comprehensive treatment plan) has been undertaken; G. Medication  prescribed by, or in consultation with, a board certified pain management specialist. Fentanyl  Transdermal Patches are considered medically necessary for continued use when the above  medical necessity criteria are met and there is documentation of beneficial response

## 2022-10-28 ENCOUNTER — Other Ambulatory Visit: Payer: Self-pay

## 2022-10-28 NOTE — Telephone Encounter (Signed)
Called pt she stated ok for  Now don't need refill until March

## 2022-11-18 ENCOUNTER — Other Ambulatory Visit: Payer: Self-pay | Admitting: Family Medicine

## 2022-11-18 MED ORDER — OXYCODONE HCL 20 MG PO TABS: 1.0000 | ORAL_TABLET | Freq: Four times a day (QID) | ORAL | 0 refills | Status: DC | PRN

## 2022-11-18 MED ORDER — FENTANYL 25 MCG/HR TD PT72: 1.0000 | MEDICATED_PATCH | TRANSDERMAL | 0 refills | Status: DC

## 2022-12-13 ENCOUNTER — Other Ambulatory Visit: Payer: Self-pay | Admitting: Family Medicine

## 2022-12-15 MED ORDER — OXYCODONE HCL 20 MG PO TABS: 1.0000 | ORAL_TABLET | Freq: Four times a day (QID) | ORAL | 0 refills | Status: DC | PRN

## 2022-12-15 MED ORDER — FENTANYL 25 MCG/HR TD PT72: 1.0000 | MEDICATED_PATCH | TRANSDERMAL | 0 refills | Status: DC

## 2022-12-15 NOTE — Telephone Encounter (Signed)
Requesting: fentanyl 64mcg/hr and oxycodone 20mg   Contract: 03/03/22 UDS: 03/03/22 Last Visit: 08/18/22 Next Visit: 02/16/23 Last Refill on fentanyl: 11/18/22 #10 and 0RF Last Refill on oxycodone: 11/18/22 #120 and 0RF   Please Advise

## 2023-01-06 ENCOUNTER — Other Ambulatory Visit: Payer: Self-pay | Admitting: Family Medicine

## 2023-01-08 ENCOUNTER — Other Ambulatory Visit: Payer: Self-pay | Admitting: Family Medicine

## 2023-01-08 MED ORDER — FENTANYL 25 MCG/HR TD PT72: 1.0000 | MEDICATED_PATCH | TRANSDERMAL | 0 refills | Status: DC

## 2023-01-08 NOTE — Telephone Encounter (Signed)
Requesting: fentanyl  Contract:  03/03/22 UDS: 03/03/22 Last Visit: 08/18/22 Next Visit: 02/16/23 Last Refill: 12/15/22 #10 and 0RF  Please Advise

## 2023-01-12 ENCOUNTER — Encounter: Payer: Self-pay | Admitting: Family Medicine

## 2023-01-12 ENCOUNTER — Other Ambulatory Visit: Payer: Self-pay | Admitting: Family Medicine

## 2023-01-12 ENCOUNTER — Encounter: Payer: Self-pay | Admitting: *Deleted

## 2023-01-13 NOTE — Telephone Encounter (Addendum)
Pt called trying to understand what was going on with her medication refills. After reviewing chart, advised pt of the following:  - A digital request was sent in to [place] from the pharmacy on 4.9.24 for the oxycodone and we sent in a script for it on 4.10.24.  - On 4.11.24, she sent in a MyChart Request for both the oxycodone and fentanyl. Oxycodone was denied due to being sent in already but fentanyl was sent in the same day.  - On 4.15.24, she sent another request for both medications (pt stated she was under the impression the first request was too soon). Both were denied due to both already being sent in.   - Informed pt that even though the refill was sent in a little early, we will do that in order to make sure the pharmacy gets the medication to the pt in a timely manner so that there is not a gp in taking those meds.  Advised pt to follow up with pharmacy to check for available refills and to let us know if for some reason they didn't receive them. Pt acknowledged understanding the why behind the sequence of events that unfolded and stated she would follow up with them.

## 2023-02-09 ENCOUNTER — Other Ambulatory Visit: Payer: Self-pay | Admitting: Family Medicine

## 2023-02-09 MED ORDER — OXYCODONE HCL 20 MG PO TABS: 1.0000 | ORAL_TABLET | Freq: Four times a day (QID) | ORAL | 0 refills | Status: DC | PRN

## 2023-02-09 MED ORDER — FENTANYL 25 MCG/HR TD PT72: 1.0000 | MEDICATED_PATCH | TRANSDERMAL | 0 refills | Status: DC

## 2023-02-09 NOTE — Telephone Encounter (Signed)
Requesting: oxycodone 20mg  and fentanyl Contract: Yes UDS: 03/03/22 Last Visit: 08/18/2022 Next Visit: 02/16/2023 Last Refill: fentanyl 01/08/23 and oxycodone 01/07/23  Please Advise

## 2023-02-11 ENCOUNTER — Other Ambulatory Visit: Payer: Self-pay | Admitting: Family Medicine

## 2023-02-13 ENCOUNTER — Telehealth: Payer: Self-pay

## 2023-02-13 ENCOUNTER — Telehealth: Payer: Self-pay | Admitting: Family Medicine

## 2023-02-13 NOTE — Telephone Encounter (Signed)
Initial Comment Caller says that she has pain on her knee; concerned it could be a blood clot. She has an appt. on Monday Translation No Nurse Assessment Nurse: Jayme Cloud, RN, Elmyra Ricks Date/Time Lamount Cohen Time): 02/13/2023 2:57:31 PM Confirm and document reason for call. If symptomatic, describe symptoms. ---Caller says that she has pain on her knee; concerned it could be a blood clot. She has an appt. on Monday. On call back: Caller states that knee does occasionally throb, has been present gradually for about 3 weeks. Started Krill oil, joint supplement. Knee pain has gotten worse, she has tired massaging and pushing it. Does not hurt all the time. It is elevated now and not painful now. Is a dull pain when felt. L knee affected is slightly swollen. Does the patient have any new or worsening symptoms? ---Yes Will a triage be completed? ---Yes Related visit to physician within the last 2 weeks? ---No Does the PT have any chronic conditions? (i.e. diabetes, asthma, this includes High risk factors for pregnancy, etc.) ---Yes List chronic conditions. ---HTN Is this a behavioral health or substance abuse call? ---No Guidelines Guideline Title Affirmed Question Affirmed Notes Nurse Date/Time Lamount Cohen Time) Knee Pain [1] MODERATE pain (e.g., interferes with normal activities, limping) Jayme Cloud, RN, Elmyra Ricks 02/13/2023 3:00:02 PM PLEASE NOTE: All timestamps contained within this report are represented as [place] Standard Time. CONFIDENTIALTY NOTICE: This fax transmission is intended only for the addressee. It contains information that is legally privileged, confidential or otherwise protected from use or disclosure. If you are not the intended recipient, you are strictly prohibited from reviewing, disclosing, copying using or disseminating any of this information or taking any action in reliance on or regarding this information. If you have received this fax in error, please notify us  immediately by telephone so that we can arrange for its return to [place]. Phone: [phone], Toll-Free: [phone], Fax: [phone] Page: 2 of 2 Call Id: [id number] Guidelines Guideline Title Affirmed Question Affirmed Notes Nurse Date/Time Lamount Cohen Time) AND [2] present > 3 days Disp. Time Lamount Cohen Time) Disposition Final User 02/13/2023 3:07:39 PM SEE PCP WITHIN 3 DAYS Yes Jayme Cloud RN, Elmyra Ricks Final Disposition 02/13/2023 3:07:39 PM SEE PCP WITHIN 3 DAYS Yes Jayme Cloud, RN, Rodney Cruise Disagree/Comply Comply Caller Understands Yes PreDisposition Call Doctor Care Advice Given Per Guideline SEE PCP WITHIN 3 DAYS: * You need to be seen within 2 or 3 days. PAIN MEDICINES: * IBUPROFEN (E.G., MOTRIN, ADVIL): Take 400 mg (two 200 mg pills) by mouth every 6 hours. The most you should take is 6 pills a day (1,200 mg total). * NAPROXEN (E.G., ALEVE): Take 220 mg (one 220 mg pill) by mouth every 8 to 12 hours as needed. You may take 440 mg (two 220 mg pills) for your first dose. The most you should take is 3 pills a day (660 mg total). Note: In [place], the maximum is 2 pills a day (one every 12 hours; 440 mg total). CALL BACK IF: * Fever or severe knee pain occurs * Redness or severe swelling occurs * You become worse CARE ADVICE given per Knee Pain (Adult) guideline. REST YOUR KNEE FOR THE NEXT COUPLE DAYS: LOCAL HEAT: Comments User: Christean Grief, RN Date/Time (Eastern Time): 02/13/2023 3:00:31 PM Caller uses walker and cane to ambulate. User: Christean Grief, RN Date/Time Lamount Cohen Time): 02/13/2023 3:01:26 PM Has tried OTC Naproxen a week ago. User: Christean Grief, RN Date/Time Lamount Cohen Time): 02/13/2023 3:05:15 PM Oxycodone being taken currently. Referrals REFERRED TO PCP OFFICE

## 2023-02-13 NOTE — Telephone Encounter (Signed)
Called pt but not able to lvm and sent pt mychart message regarding possible blood clot. Needed her to call us back or she need to go to ER

## 2023-02-13 NOTE — Telephone Encounter (Signed)
Called pt to check up on her possible blood clot and Pt don't have voicemail setup To lvm. Sent Pt a mychart message our office was just checking up on her regarding call of  possible blood clot and  May need to be seen at ER or urgent care. We need her to call the office to let us know she got the message

## 2023-02-13 NOTE — Telephone Encounter (Signed)
FYI: This call has been transferred to Access Nurse. Once the result note has been entered staff can address the message at that time.  Patient called in with the following symptoms:  Red Word: Possible Blood Clot   Please advise at Mobile [phone] (mobile)  Message is routed to Provider Pool and [facility] Triage

## 2023-02-15 NOTE — Assessment & Plan Note (Signed)
Well controlled, no changes to meds. Encouraged heart healthy diet such as the DASH diet and exercise as tolerated.  °

## 2023-02-15 NOTE — Assessment & Plan Note (Signed)
On Levothyroxine, continue to monitor 

## 2023-02-15 NOTE — Assessment & Plan Note (Signed)
Supplement and monitor 

## 2023-02-15 NOTE — Assessment & Plan Note (Signed)
Hydrate and monitor 

## 2023-02-15 NOTE — Assessment & Plan Note (Signed)
Encourage heart healthy diet such as MIND or DASH diet, increase exercise, avoid trans fats, simple carbohydrates and processed foods, consider a krill or fish or flaxseed oil cap daily. Tolerating Atorvastatin 

## 2023-02-15 NOTE — Assessment & Plan Note (Signed)
Fluoxetine and Alprazolam prn

## 2023-02-15 NOTE — Assessment & Plan Note (Signed)
hgba1c acceptable, minimize simple carbs. Increase exercise as tolerated.  

## 2023-02-16 ENCOUNTER — Encounter: Payer: Self-pay | Admitting: Family Medicine

## 2023-02-16 ENCOUNTER — Other Ambulatory Visit (INDEPENDENT_AMBULATORY_CARE_PROVIDER_SITE_OTHER): Payer: Medicaid Other

## 2023-02-16 ENCOUNTER — Other Ambulatory Visit: Payer: Self-pay

## 2023-02-16 ENCOUNTER — Other Ambulatory Visit: Payer: Self-pay | Admitting: Family Medicine

## 2023-02-16 ENCOUNTER — Ambulatory Visit (INDEPENDENT_AMBULATORY_CARE_PROVIDER_SITE_OTHER): Payer: Medicaid Other | Admitting: Family Medicine

## 2023-02-16 VITALS — BP 122/78 | HR 64 | Temp 97.8°F | Resp 16 | Ht 65.0 in | Wt 260.0 lb

## 2023-02-16 DIAGNOSIS — E785 Hyperlipidemia, unspecified: Secondary | ICD-10-CM | POA: Diagnosis not present

## 2023-02-16 DIAGNOSIS — I10 Essential (primary) hypertension: Secondary | ICD-10-CM | POA: Diagnosis not present

## 2023-02-16 DIAGNOSIS — E559 Vitamin D deficiency, unspecified: Secondary | ICD-10-CM | POA: Diagnosis not present

## 2023-02-16 DIAGNOSIS — E039 Hypothyroidism, unspecified: Secondary | ICD-10-CM

## 2023-02-16 DIAGNOSIS — M25562 Pain in left knee: Secondary | ICD-10-CM | POA: Diagnosis not present

## 2023-02-16 DIAGNOSIS — E2839 Other primary ovarian failure: Secondary | ICD-10-CM | POA: Diagnosis not present

## 2023-02-16 DIAGNOSIS — E213 Hyperparathyroidism, unspecified: Secondary | ICD-10-CM | POA: Diagnosis not present

## 2023-02-16 DIAGNOSIS — R739 Hyperglycemia, unspecified: Secondary | ICD-10-CM | POA: Diagnosis not present

## 2023-02-16 DIAGNOSIS — Z79899 Other long term (current) drug therapy: Secondary | ICD-10-CM

## 2023-02-16 DIAGNOSIS — K635 Polyp of colon: Secondary | ICD-10-CM | POA: Diagnosis not present

## 2023-02-16 DIAGNOSIS — Z800 Family history of malignant neoplasm of digestive organs: Secondary | ICD-10-CM

## 2023-02-16 DIAGNOSIS — F418 Other specified anxiety disorders: Secondary | ICD-10-CM | POA: Diagnosis not present

## 2023-02-16 DIAGNOSIS — M1A9XX0 Chronic gout, unspecified, without tophus (tophi): Secondary | ICD-10-CM

## 2023-02-16 DIAGNOSIS — Z780 Asymptomatic menopausal state: Secondary | ICD-10-CM | POA: Diagnosis not present

## 2023-02-16 MED ORDER — ALLOPURINOL 100 MG PO TABS: 100.0000 mg | ORAL_TABLET | Freq: Every day | ORAL | 6 refills | Status: DC

## 2023-02-16 NOTE — Assessment & Plan Note (Signed)
Encouraged moist heat and gentle stretching as tolerated. May try NSAIDs and prescription meds as directed and report if symptoms worsen or seek immediate care referred to sports medicine for ongoing care check an xray.

## 2023-02-16 NOTE — Assessment & Plan Note (Signed)
Referred back to gastroenterology for surveillance colonoscopy

## 2023-02-16 NOTE — Addendum Note (Signed)
Addended by: Mervin Kung A on: 02/16/2023 03:04 PM   Modules accepted: Orders

## 2023-02-16 NOTE — Progress Notes (Signed)
Subjective:   By signing my name below, I, Doylene Bode, attest that this documentation has been prepared under the direction and in the presence of Danise Edge, MD 02/16/23   Patient ID: [name] [name], female    DOB: [birth year], 62 y.o.   MRN: [id number]  Chief Complaint  Patient presents with   Follow-up    Follow up     HPI Patient is in today for a 6 month follow-up.  Anxiety and Depression Stable with alprazolam 0.25 mg twice daily as needed, fluoxetine 10 mg daily.  Hypertension Treated with lisinopril 10 mg daily, metoprolol succinate 25 mg daily. Blood pressure normal today at 122/78.  Hyperlipidemia Treated with atorvastatin 20 mg daily.  Left Knee Pain Started having intermittent pain in left knee on 02/09/23 when she took a heavy step and felt immediate pain. Pain has improved in the past week, worse upon increased activity. Longstanding history of right knee pain.  Morbid Obesity Treated with Wegovy 0.25 mg weekly. Recently switched to Medicaid and is unsure whether this will be covered.  Insomnia Stable with trazodone 100 mg at bedtime as needed.  Denies GI symptoms, respiratory symptoms, recent illness. Requesting allopurinol refill- denies recent flare-ups. Requesting referral for bone density scan. Requesting referral for colonoscopy. History of adenomatous colonic polyps. Family history of colon cancer in mother, maternal aunt, maternal grandfather. Labs are pending.  Past Medical History:  Diagnosis Date   Acute foot pain, right 01/13/2017   Acute upper respiratory infections of unspecified site 06/12/2014   Anxiety and depression    chronic   Breast cancer screening 09/05/2016   Cervical cancer screening 09/05/2016   Chronic back pain    Depression with anxiety 08/23/2008   Qualifier: Diagnosis of  By: Alphonzo Severance MD, Loni Dolly    Gout 05/05/2017   History of colon polyps    History of fibromyalgia    suspected    Hypertension    Insomnia     Knee pain, right 06/24/2015   Meningitis spinal    Morbid obesity (HCC)    Ovarian cyst, bilateral    Preventative health care 06/24/2015   PTSD (post-traumatic stress disorder)    Vitamin D deficiency 06/24/2015    Past Surgical History:  Procedure Laterality Date   COLONOSCOPY     OVARIAN CYST REMOVAL      Family History  Problem Relation Age of Onset   Colon cancer Mother    Breast cancer Mother        benign   Rheum arthritis Father    Diabetes Father    Congestive Heart Failure Father    Hypertension Father    Stroke Father    Colon cancer Maternal Aunt    Cancer Maternal Uncle 80       colon   Hip fracture Maternal Grandmother    COPD Maternal Grandfather 87       colon   Colon cancer Maternal Grandfather    Lymphoma Paternal Grandmother    Breast cancer Cousin        negative stage 3   Breast cancer Maternal Great-grandmother    ADD / ADHD Neg Hx    Depression Neg Hx    Alcohol abuse Neg Hx    Drug abuse Neg Hx    Esophageal cancer Neg Hx     Social History   Socioeconomic History   Marital status: Divorced    Spouse name: Not on file   Number of children: Not on file  Years of education: Not on file   Highest education level: Not on file  Occupational History   Not on file  Tobacco Use   Smoking status: Former    Types: Cigarettes    Quit date: 04/04/1989    Years since quitting: 33.8   Smokeless tobacco: Never  Vaping Use   Vaping Use: Never used  Substance and Sexual Activity   Alcohol use: Yes    Comment: occassionally- 2 glassses of wine 2x/month   Drug use: No    Comment: take meds as prescribed   Sexual activity: Not Currently  Other Topics Concern   Not on file  Social History Narrative   Not on file   Social Determinants of Health   Financial Resource Strain: Not on file  Food Insecurity: Not on file  Transportation Needs: Not on file  Physical Activity: Not on file  Stress: Not on file  Social Connections: Not on file   Intimate Partner Violence: Not on file    Outpatient Medications Prior to Visit  Medication Sig Dispense Refill   ALPRAZolam (XANAX) 0.25 MG tablet Take 1 tablet (0.25 mg total) by mouth 2 (two) times daily as needed for anxiety. 5 tablet 1   atorvastatin (LIPITOR) 20 MG tablet Take 1 tablet (20 mg total) by mouth daily. 90 tablet 1   beclomethasone (BECONASE-AQ) 42 MCG/SPRAY nasal spray Place 1 spray into both nostrils 2 (two) times daily. Dose is for each nostril. 25 g 1   colchicine 0.6 MG tablet TAKE 2 TABLETS BY MOUTH ONCE THEN 1 TABLET BY MOUTH EVERY 2 HOURS AS NEEDED PAIN TIL PAIN GONE, MAXIMUM OF 6 TABLETS IN 24 HOURS OR INTOLERABLE DIARRHEA 6 tablet 2   cyclobenzaprine (FLEXERIL) 10 MG tablet TAKE ONE-HALF TO ONE TABLET BY MOUTH TWICE DAILY AS NEEDED FOR MUSCLE SPASM 40 tablet 2   fentaNYL (DURAGESIC) 25 MCG/HR Place 1 patch onto the skin every 3 (three) days. 10 patch 0   FLUoxetine (PROZAC) 10 MG capsule TAKE ONE CAPSULE BY MOUTH ONE TIME DAILY 90 capsule 1   lisinopril (ZESTRIL) 10 MG tablet TAKE ONE TABLET BY MOUTH ONE TIME DAILY 90 tablet 1   metoprolol succinate (TOPROL-XL) 25 MG 24 hr tablet TAKE ONE TABLET BY MOUTH ONE TIME DAILY 90 tablet 1   nystatin cream (MYCOSTATIN) Apply 1 Application topically 2 (two) times daily. 30 g 02   Oxycodone HCl 20 MG TABS Take 1 tablet (20 mg total) by mouth 4 (four) times daily as needed. 120 tablet 0   Semaglutide-Weight Management (WEGOVY) 0.25 MG/0.5ML SOAJ Inject 0.25 mg into the skin once a week. 2 mL 0   traZODone (DESYREL) 100 MG tablet TAKE ONE TABLET BY MOUTH ONE TIME DAILY AT BEDTIME AS NEEDED FOR SLEEP 30 tablet 1   allopurinol (ZYLOPRIM) 100 MG tablet TAKE ONE TABLET BY MOUTH ONE TIME DAILY 30 tablet 6   No facility-administered medications prior to visit.    Allergies  Allergen Reactions   Cymbalta [Duloxetine Hcl] Other (See Comments)    Headaches   Fiorinal [Butalbital-Aspirin-Caffeine] Other (See Comments)    Mental  Clarity.    Review of Systems  Constitutional:  Negative for fever and malaise/fatigue.  HENT:  Negative for congestion.   Eyes:  Negative for blurred vision.  Respiratory:  Negative for cough and shortness of breath.   Cardiovascular:  Negative for chest pain, palpitations and leg swelling.  Gastrointestinal:  Negative for vomiting.  Musculoskeletal:  Positive for joint pain (Left knee).  Negative for back pain.  Skin:  Negative for rash.  Neurological:  Negative for loss of consciousness and headaches.       Objective:    Physical Exam Vitals and nursing note reviewed.  Constitutional:      General: She is not in acute distress.    Appearance: Normal appearance. She is well-developed. She is not ill-appearing or toxic-appearing.  HENT:     Head: Normocephalic and atraumatic.     Right Ear: External ear normal.     Left Ear: External ear normal.     Nose: Nose normal.     Mouth/Throat:     Mouth: Mucous membranes are moist.  Eyes:     Conjunctiva/sclera: Conjunctivae normal.  Cardiovascular:     Rate and Rhythm: Normal rate and regular rhythm.     Pulses: Normal pulses.     Heart sounds: Normal heart sounds, S1 normal and S2 normal.  Pulmonary:     Effort: Pulmonary effort is normal.     Breath sounds: Normal breath sounds.  Abdominal:     Palpations: Abdomen is soft.     Tenderness: There is no guarding.  Musculoskeletal:     Comments: Swelling distal and medial left knee. Tenderness over left medial meniscus.  Skin:    General: Skin is warm and dry.  Neurological:     General: No focal deficit present.     Mental Status: She is alert and oriented to person, place, and time.  Psychiatric:        Speech: Speech normal.        Behavior: Behavior normal. Behavior is cooperative.     BP 122/78 (BP Location: Right Arm, Patient Position: Sitting, Cuff Size: Normal)   Pulse 64   Temp 97.8 F (36.6 C) (Oral)   Resp 16   Ht 5\' 5"  (1.651 m)   Wt 260 lb (117.9 kg)    SpO2 95%   BMI 43.27 kg/m  Wt Readings from Last 3 Encounters:  02/16/23 260 lb (117.9 kg)  08/18/22 260 lb (117.9 kg)  03/03/22 255 lb (115.7 kg)       Assessment & Plan:  Depression with anxiety Assessment & Plan: Fluoxetine and Alprazolam prn   Essential hypertension Assessment & Plan: Well controlled, no changes to meds. Encouraged heart healthy diet such as the DASH diet and exercise as tolerated.     Hyperglycemia Assessment & Plan: hgba1c acceptable, minimize simple carbs. Increase exercise as tolerated.   Hyperlipidemia, unspecified hyperlipidemia type Assessment & Plan: Encourage heart healthy diet such as MIND or DASH diet, increase exercise, avoid trans fats, simple carbohydrates and processed foods, consider a krill or fish or flaxseed oil cap daily.  Tolerating Atorvastatin   Hypothyroidism, unspecified type Assessment & Plan: On Levothyroxine, continue to monitor    Vitamin D deficiency Assessment & Plan: Supplement and monitor    High risk medication use  Left knee pain, unspecified chronicity Assessment & Plan: Encouraged moist heat and gentle stretching as tolerated. May try NSAIDs and prescription meds as directed and report if symptoms worsen or seek immediate care referred to sports medicine for ongoing care check an xray.   Orders: -     DG Knee 1-2 Views Left; Future -     Ambulatory referral to Sports Medicine  Estrogen deficiency -     DG Bone Density; Future  Post-menopausal -     DG Bone Density; Future  FH: colon cancer -     Ambulatory referral to  Gastroenterology  Polyp of colon, unspecified part of colon, unspecified type Assessment & Plan: Referred back to gastroenterology for surveillance colonoscopy  Orders: -     Ambulatory referral to Gastroenterology  Other orders -     Allopurinol; Take 1 tablet (100 mg total) by mouth daily.  Dispense: 30 tablet; Refill: 6     I,Alexander Ruley,acting as a scribe for  Danise Edge, MD.,have documented all relevant documentation on the behalf of Danise Edge, MD,as directed by  Danise Edge, MD while in the presence of Danise Edge, MD.

## 2023-02-17 ENCOUNTER — Other Ambulatory Visit: Payer: Self-pay

## 2023-02-17 DIAGNOSIS — M1A9XX0 Chronic gout, unspecified, without tophus (tophi): Secondary | ICD-10-CM

## 2023-02-17 DIAGNOSIS — E785 Hyperlipidemia, unspecified: Secondary | ICD-10-CM

## 2023-02-17 LAB — CBC WITH DIFFERENTIAL/PLATELET
Basophils Absolute: 0 10*3/uL (ref 0.0–0.1)
Basophils Relative: 0.5 % (ref 0.0–3.0)
Eosinophils Absolute: 0.2 10*3/uL (ref 0.0–0.7)
Eosinophils Relative: 2.7 % (ref 0.0–5.0)
HCT: 42 % (ref 36.0–46.0)
Hemoglobin: 13.5 g/dL (ref 12.0–15.0)
Lymphocytes Relative: 24 % (ref 12.0–46.0)
Lymphs Abs: 2.1 10*3/uL (ref 0.7–4.0)
MCHC: 32.2 g/dL (ref 30.0–36.0)
MCV: 94.8 fl (ref 78.0–100.0)
Monocytes Absolute: 0.6 10*3/uL (ref 0.1–1.0)
Monocytes Relative: 6.7 % (ref 3.0–12.0)
Neutro Abs: 5.9 10*3/uL (ref 1.4–7.7)
Neutrophils Relative %: 66.1 % (ref 43.0–77.0)
Platelets: 212 10*3/uL (ref 150.0–400.0)
RBC: 4.43 Mil/uL (ref 3.87–5.11)
RDW: 14.8 % (ref 11.5–15.5)
WBC: 8.9 10*3/uL (ref 4.0–10.5)

## 2023-02-17 LAB — PARATHYROID HORMONE, INTACT (NO CA): PTH: 58 pg/mL (ref 16–77)

## 2023-02-17 LAB — HEMOGLOBIN A1C: Hgb A1c MFr Bld: 5.9 % (ref 4.6–6.5)

## 2023-02-17 LAB — COMPREHENSIVE METABOLIC PANEL
ALT: 21 U/L (ref 0–35)
AST: 20 U/L (ref 0–37)
Albumin: 4.2 g/dL (ref 3.5–5.2)
Alkaline Phosphatase: 78 U/L (ref 39–117)
BUN: 13 mg/dL (ref 6–23)
CO2: 30 mEq/L (ref 19–32)
Calcium: 10.5 mg/dL (ref 8.4–10.5)
Chloride: 98 mEq/L (ref 96–112)
Creatinine, Ser: 0.6 mg/dL (ref 0.40–1.20)
GFR: 96.95 mL/min (ref >60.00)
Glucose, Bld: 111 mg/dL — ABNORMAL HIGH (ref 70–99)
Potassium: 4.1 mEq/L (ref 3.5–5.1)
Sodium: 140 mEq/L (ref 135–145)
Total Bilirubin: 0.5 mg/dL (ref 0.2–1.2)
Total Protein: 6.6 g/dL (ref 6.0–8.3)

## 2023-02-17 LAB — VITAMIN D 25 HYDROXY (VIT D DEFICIENCY, FRACTURES): VITD: 25.8 ng/mL — ABNORMAL LOW (ref 30.00–100.00)

## 2023-02-17 LAB — LIPID PANEL
Cholesterol: 158 mg/dL (ref 0–200)
HDL: 68.4 mg/dL (ref >39.00)
NonHDL: 89.84
Total CHOL/HDL Ratio: 2
Triglycerides: 350 mg/dL — ABNORMAL HIGH (ref 0.0–149.0)
VLDL: 70 mg/dL — ABNORMAL HIGH (ref 0.0–40.0)

## 2023-02-17 LAB — DM TEMPLATE

## 2023-02-17 LAB — TSH: TSH: 3.03 u[IU]/mL (ref 0.35–5.50)

## 2023-02-17 LAB — LDL CHOLESTEROL, DIRECT: Direct LDL: 58 mg/dL

## 2023-02-17 LAB — URIC ACID: Uric Acid, Serum: 7.8 mg/dL — ABNORMAL HIGH (ref 2.4–7.0)

## 2023-02-17 LAB — DRUG MONITORING PANEL 376104, URINE

## 2023-02-17 MED ORDER — ALLOPURINOL 100 MG PO TABS: 100.0000 mg | ORAL_TABLET | Freq: Two times a day (BID) | ORAL | 4 refills | Status: DC

## 2023-02-17 MED ORDER — ATORVASTATIN CALCIUM 40 MG PO TABS: 40.0000 mg | ORAL_TABLET | Freq: Every day | ORAL | 3 refills | Status: DC

## 2023-02-18 LAB — DRUG MONITORING PANEL 376104, URINE
Amphetamines: NEGATIVE ng/mL (ref <500)
Benzodiazepines: NEGATIVE ng/mL (ref <100)
Cocaine Metabolite: NEGATIVE ng/mL (ref <150)
Codeine: NEGATIVE ng/mL (ref <50)
Desmethyltramadol: NEGATIVE ng/mL (ref <100)
Hydrocodone: NEGATIVE ng/mL (ref <50)
Hydromorphone: NEGATIVE ng/mL (ref <50)
Morphine: NEGATIVE ng/mL (ref <50)
Norhydrocodone: NEGATIVE ng/mL (ref <50)
Noroxycodone: 5855 ng/mL — ABNORMAL HIGH (ref <50)
Opiates: NEGATIVE ng/mL (ref <100)
Oxycodone: 425 ng/mL — ABNORMAL HIGH (ref <50)
Oxycodone: POSITIVE ng/mL — AB (ref <100)
Oxymorphone: 408 ng/mL — ABNORMAL HIGH (ref <50)
Tramadol: NEGATIVE ng/mL (ref <100)

## 2023-03-06 ENCOUNTER — Other Ambulatory Visit: Payer: Self-pay | Admitting: Family Medicine

## 2023-03-09 ENCOUNTER — Other Ambulatory Visit: Payer: Self-pay | Admitting: Family Medicine

## 2023-03-10 ENCOUNTER — Telehealth (HOSPITAL_BASED_OUTPATIENT_CLINIC_OR_DEPARTMENT_OTHER): Payer: Self-pay

## 2023-03-10 MED ORDER — FENTANYL 25 MCG/HR TD PT72: 1.0000 | MEDICATED_PATCH | TRANSDERMAL | 0 refills | Status: DC

## 2023-03-10 MED ORDER — OXYCODONE HCL 20 MG PO TABS: 1.0000 | ORAL_TABLET | Freq: Four times a day (QID) | ORAL | 0 refills | Status: DC | PRN

## 2023-03-10 NOTE — Telephone Encounter (Signed)
Requesting: Fentanyl and oxycodone 20mg  Contract: 03/19/22  UDS: 02/16/23 Last Visit: 02/16/23 Next Visit: 08/20/23 Last Refill on oxycodone: 02/09/23 #120 and 0RF Last Refill on fentanyl  02/09/23 #10 and 0RF  Please Advise

## 2023-04-02 ENCOUNTER — Other Ambulatory Visit: Payer: Self-pay | Admitting: Family Medicine

## 2023-04-03 MED ORDER — FENTANYL 25 MCG/HR TD PT72: 1.0000 | MEDICATED_PATCH | TRANSDERMAL | 0 refills | Status: DC

## 2023-04-03 MED ORDER — OXYCODONE HCL 20 MG PO TABS: 1.0000 | ORAL_TABLET | Freq: Four times a day (QID) | ORAL | 0 refills | Status: DC | PRN

## 2023-04-07 ENCOUNTER — Other Ambulatory Visit: Payer: Self-pay | Admitting: Family Medicine

## 2023-04-15 ENCOUNTER — Other Ambulatory Visit: Payer: Self-pay | Admitting: Family Medicine

## 2023-04-28 ENCOUNTER — Other Ambulatory Visit: Payer: Self-pay | Admitting: Family Medicine

## 2023-04-29 MED ORDER — OXYCODONE HCL 20 MG PO TABS: 1.0000 | ORAL_TABLET | Freq: Four times a day (QID) | ORAL | 0 refills | Status: DC | PRN

## 2023-04-29 MED ORDER — FENTANYL 25 MCG/HR TD PT72: 1.0000 | MEDICATED_PATCH | TRANSDERMAL | 0 refills | Status: DC

## 2023-04-29 NOTE — Telephone Encounter (Signed)
Requesting: fentanyl and oxycodone Contract: 02/16/23 UDS: 02/16/23 Last Visit: 02/16/23 Next Visit: 08/24/23 Last Refill 04/03/23 #120 and 0RF and 10 and 0RF   Please Advise

## 2023-05-19 ENCOUNTER — Other Ambulatory Visit: Payer: Medicaid Other

## 2023-05-29 ENCOUNTER — Other Ambulatory Visit: Payer: Self-pay | Admitting: Family Medicine

## 2023-05-29 MED ORDER — FENTANYL 25 MCG/HR TD PT72: 1.0000 | MEDICATED_PATCH | TRANSDERMAL | 0 refills | Status: DC

## 2023-05-29 MED ORDER — OXYCODONE HCL 20 MG PO TABS: 1.0000 | ORAL_TABLET | Freq: Four times a day (QID) | ORAL | 0 refills | Status: DC | PRN

## 2023-05-29 NOTE — Telephone Encounter (Signed)
Requesting: fentanyl 1mcg/hr and oxycodone 20mg   Contract: 02/16/23 UDS: 02/16/23 Last Visit: 02/16/23 Next Visit: 08/24/23 Last Refill:  04/29/23 # 120 and 0rf and 10 and 0RF   Please Advise

## 2023-06-23 ENCOUNTER — Encounter: Payer: Self-pay | Admitting: Gastroenterology

## 2023-06-29 ENCOUNTER — Other Ambulatory Visit: Payer: Self-pay | Admitting: Family Medicine

## 2023-06-29 MED ORDER — OXYCODONE HCL 20 MG PO TABS: 1.0000 | ORAL_TABLET | Freq: Four times a day (QID) | ORAL | 0 refills | Status: DC | PRN

## 2023-06-29 MED ORDER — FENTANYL 25 MCG/HR TD PT72: 1.0000 | MEDICATED_PATCH | TRANSDERMAL | 0 refills | Status: DC

## 2023-06-29 NOTE — Telephone Encounter (Signed)
Requesting: fentanyl 42mcg/hr and oxycodone 20mg   Contract: 02/16/23 UDS: 02/16/23 Last Visit: 02/16/23 Next Visit: 08/24/23 Last Refill:  05/29/2023 Oxy # 120 and 0rf and Fentanyl 10 and 0RF    Please Advise

## 2023-06-30 ENCOUNTER — Telehealth: Payer: Self-pay | Admitting: Gastroenterology

## 2023-06-30 NOTE — Telephone Encounter (Signed)
Good Morning Dr Barron Alvine   We have received a call fro patient requesting to transfer care back to [place] from [facility] which she was seen with them in 2021.  Patient is due for her colon procedure.  Patients states she was not aware she was seeing another gastro provider. Please review records and advise on scheduling.   Thank you

## 2023-07-02 ENCOUNTER — Encounter: Payer: Self-pay | Admitting: Gastroenterology

## 2023-07-02 NOTE — Telephone Encounter (Signed)
Patient scheduled for pre visit and procedure.

## 2023-07-15 ENCOUNTER — Ambulatory Visit (AMBULATORY_SURGERY_CENTER): Payer: Medicaid Other

## 2023-07-15 VITALS — Ht 65.0 in | Wt 246.0 lb

## 2023-07-15 DIAGNOSIS — Z800 Family history of malignant neoplasm of digestive organs: Secondary | ICD-10-CM

## 2023-07-15 DIAGNOSIS — Z860100 Personal history of colon polyps, unspecified: Secondary | ICD-10-CM

## 2023-07-15 MED ORDER — NA SULFATE-K SULFATE-MG SULF 17.5-3.13-1.6 GM/177ML PO SOLN
1.0000 | Freq: Once | ORAL | 0 refills | Status: AC
Start: 2023-07-15 — End: 2023-07-15

## 2023-07-15 NOTE — Progress Notes (Signed)
No egg or soy allergy known to patient  No issues known to pt with past sedation with any surgeries or procedures Patient denies ever being told they had issues or difficulty with intubation  No FH of Malignant Hyperthermia Pt is not on diet pills Pt is not on  home 02  Pt is not on blood thinners  Pt reports constipation at times uses OTC stool softener as needed  No A fib or A flutter Have any cardiac testing pending--no  LOA: independent  Prep: Suprep   Patient's chart reviewed by [name] [name] CNRA prior to previsit and patient appropriate for the [facility].  Previsit completed and red dot placed by patient's name on their procedure day (on provider's schedule).     PV competed with patient. Prep instructions sent via mychart and home address. [facility] provided to use for price reduction if needed.

## 2023-07-24 ENCOUNTER — Other Ambulatory Visit: Payer: Self-pay | Admitting: Family Medicine

## 2023-07-24 MED ORDER — FENTANYL 25 MCG/HR TD PT72: 1.0000 | MEDICATED_PATCH | TRANSDERMAL | 0 refills | Status: DC

## 2023-07-24 MED ORDER — OXYCODONE HCL 20 MG PO TABS: 1.0000 | ORAL_TABLET | Freq: Four times a day (QID) | ORAL | 0 refills | Status: DC | PRN

## 2023-07-24 NOTE — Telephone Encounter (Signed)
Requesting: fentanyl 63mcg/hr and oxycodone 20mg   Contract: 02/16/23 UDS: 02/16/23 Last Visit: 02/16/23 Next Visit: 08/24/23 Last Refill:  06/29/2023 Oxy # 120 and 0rf and Fentanyl #10 and 0RF    Please Advise

## 2023-07-31 ENCOUNTER — Other Ambulatory Visit (HOSPITAL_COMMUNITY): Payer: Self-pay

## 2023-07-31 ENCOUNTER — Telehealth: Payer: Self-pay | Admitting: Gastroenterology

## 2023-07-31 NOTE — Telephone Encounter (Signed)
Called and spoke with pharmacist= pharmacy will fill RX for patient and notify her when it is ready to pick up

## 2023-07-31 NOTE — Telephone Encounter (Signed)
Patient states she did not pick up the Suprep in time it was canceled, would like [facility] [address].

## 2023-08-05 ENCOUNTER — Ambulatory Visit: Payer: Medicaid Other | Admitting: Gastroenterology

## 2023-08-05 ENCOUNTER — Encounter: Payer: Self-pay | Admitting: Gastroenterology

## 2023-08-05 VITALS — BP 139/73 | HR 60 | Temp 97.3°F | Resp 17 | Ht 65.0 in | Wt 246.0 lb

## 2023-08-05 DIAGNOSIS — Z09 Encounter for follow-up examination after completed treatment for conditions other than malignant neoplasm: Secondary | ICD-10-CM

## 2023-08-05 DIAGNOSIS — K635 Polyp of colon: Secondary | ICD-10-CM | POA: Diagnosis not present

## 2023-08-05 DIAGNOSIS — D120 Benign neoplasm of cecum: Secondary | ICD-10-CM

## 2023-08-05 DIAGNOSIS — Z860101 Personal history of adenomatous and serrated colon polyps: Secondary | ICD-10-CM | POA: Diagnosis not present

## 2023-08-05 DIAGNOSIS — D125 Benign neoplasm of sigmoid colon: Secondary | ICD-10-CM | POA: Diagnosis not present

## 2023-08-05 DIAGNOSIS — Z1211 Encounter for screening for malignant neoplasm of colon: Secondary | ICD-10-CM | POA: Diagnosis not present

## 2023-08-05 DIAGNOSIS — D122 Benign neoplasm of ascending colon: Secondary | ICD-10-CM | POA: Diagnosis not present

## 2023-08-05 DIAGNOSIS — F418 Other specified anxiety disorders: Secondary | ICD-10-CM | POA: Diagnosis not present

## 2023-08-05 DIAGNOSIS — Z800 Family history of malignant neoplasm of digestive organs: Secondary | ICD-10-CM | POA: Diagnosis not present

## 2023-08-05 DIAGNOSIS — Z860100 Personal history of colon polyps, unspecified: Secondary | ICD-10-CM

## 2023-08-05 DIAGNOSIS — I10 Essential (primary) hypertension: Secondary | ICD-10-CM | POA: Diagnosis not present

## 2023-08-05 MED ORDER — SODIUM CHLORIDE 0.9 % IV SOLN: 500.0000 mL | Freq: Once | INTRAVENOUS | Status: DC

## 2023-08-05 NOTE — Op Note (Signed)
[facility] Patient Name: [name] [name] Procedure Date: 08/05/2023 2:30 PM MRN: [id number] Endoscopist: Doristine Locks , MD, [id number] Age: 62 Referring MD:  Date of Birth: November 12, 1960 Gender: Female Account #: [id number] Procedure:                Colonoscopy Indications:              Surveillance: Personal history of adenomatous                            polyps on last colonoscopy 3 years ago                           Last colonoscopy was 11/2019 and notable for 5                            subcentimeter adenomas and recommended repeat in 3                            years. Does have a family history of colon cancer                            (mother, maternal aunt, maternal grandfather all                            diagnosed above age 7). Medicines:                Monitored Anesthesia Care Procedure:                Pre-Anesthesia Assessment:                           - Prior to the procedure, a History and Physical                            was performed, and patient medications and                            allergies were reviewed. The patient's tolerance of                            previous anesthesia was also reviewed. The risks                            and benefits of the procedure and the sedation                            options and risks were discussed with the patient.                            All questions were answered, and informed consent                            was obtained. Prior Anticoagulants: The patient has  taken no anticoagulant or antiplatelet agents. ASA                            Grade Assessment: III - A patient with severe                            systemic disease. After reviewing the risks and                            benefits, the patient was deemed in satisfactory                            condition to undergo the procedure.                           After obtaining informed consent, the colonoscope                             was passed under direct vision. Throughout the                            procedure, the patient's blood pressure, pulse, and                            oxygen saturations were monitored continuously. The                            CF [id number] #[id number] was introduced through the anus                            and advanced to the the terminal ileum. The                            colonoscopy was performed without difficulty. The                            patient tolerated the procedure well. The quality                            of the bowel preparation was good. The terminal                            ileum, ileocecal valve, appendiceal orifice, and                            rectum were photographed. Scope In: 2:50:49 PM Scope Out: 3:04:59 PM Scope Withdrawal Time: 0 hours 11 minutes 10 seconds  Total Procedure Duration: 0 hours 14 minutes 10 seconds  Findings:                 The perianal and digital rectal examinations were                            normal.  Three sessile polyps were found in the ascending                            colon (1) and cecum (2). The polyps were 2 to 4 mm                            in size. These polyps were removed with a cold                            snare. Resection and retrieval were complete.                            Estimated blood loss was minimal.                           A 3 mm polyp was found in the sigmoid colon. The                            polyp was sessile. The polyp was removed with a                            cold snare. Resection and retrieval were complete.                            Estimated blood loss was minimal.                           The retroflexed view of the distal rectum and anal                            verge was normal and showed no anal or rectal                            abnormalities.                           The terminal ileum appeared  normal. Complications:            No immediate complications. Estimated Blood Loss:     Estimated blood loss was minimal. Impression:               - Three 2 to 4 mm polyps in the ascending colon and                            in the cecum, removed with a cold snare. Resected                            and retrieved.                           - One 3 mm polyp in the sigmoid colon, removed with  a cold snare. Resected and retrieved.                           - The distal rectum and anal verge are normal on                            retroflexion view.                           - The examined portion of the ileum was normal. Recommendation:           - Patient has a contact number available for                            emergencies. The signs and symptoms of potential                            delayed complications were discussed with the                            patient. Return to normal activities tomorrow.                            Written discharge instructions were provided to the                            patient.                           - Resume previous diet.                           - Continue present medications.                           - Await pathology results.                           - Repeat colonoscopy for surveillance based on                            pathology results.                           - Return to GI office PRN. Doristine Locks, MD 08/05/2023 3:11:10 PM

## 2023-08-05 NOTE — Progress Notes (Signed)
Pt's states no medical or surgical changes since previsit or office visit. 

## 2023-08-05 NOTE — Progress Notes (Signed)
Called to room to assist during endoscopic procedure.  Patient ID and intended procedure confirmed with present staff. Received instructions for my participation in the procedure from the performing physician.  

## 2023-08-05 NOTE — Patient Instructions (Signed)

## 2023-08-05 NOTE — Progress Notes (Signed)
Sedate, gd SR, tolerated procedure well, VSS, report to RN 

## 2023-08-05 NOTE — Progress Notes (Signed)
GASTROENTEROLOGY PROCEDURE H&P NOTE   Primary Care Physician: Bradd Canary, MD    Reason for Procedure:  Colon polyp surveillance  Plan:    Colonoscopy  Patient is appropriate for endoscopic procedure(s) in the ambulatory ([facility]) setting.  The nature of the procedure, as well as the risks, benefits, and alternatives were carefully and thoroughly reviewed with the patient. Ample time for discussion and questions allowed. The patient understood, was satisfied, and agreed to proceed.     HPI: [name] [name] is a 62 y.o. female who presents for colonoscopy for ongoing colon polyp surveillance and colon cancer screening.  No active GI symptoms.  No known family history of colon cancer or related malignancy.  Patient is otherwise without complaints or active issues today.  Last colonoscopy was 11/2019 and notable for 5 subcentimeter adenomas and recommended repeat in 3 years.  Does have a family history of colon cancer (mother, maternal aunt, maternal grandfather all diagnosed above age 81).   She had recent labs done in 01/2023 which show normal CBC, CMP.   Past Medical History:  Diagnosis Date   Acute foot pain, right 01/13/2017   Acute upper respiratory infections of unspecified site 06/12/2014   Anxiety and depression    chronic   Breast cancer screening 09/05/2016   Cervical cancer screening 09/05/2016   Chronic back pain    Depression with anxiety 08/23/2008   Qualifier: Diagnosis of  By: Alphonzo Severance MD, Loni Dolly    Gout 05/05/2017   History of colon polyps    History of fibromyalgia    suspected    Hypertension    Insomnia    Knee pain, right 06/24/2015   Meningitis spinal    Morbid obesity (HCC)    Ovarian cyst, bilateral    Preventative health care 06/24/2015   PTSD (post-traumatic stress disorder)    Vitamin D deficiency 06/24/2015    Past Surgical History:  Procedure Laterality Date   COLONOSCOPY     OVARIAN CYST REMOVAL      Prior to Admission medications    Medication Sig Start Date End Date Taking? Authorizing Provider  allopurinol (ZYLOPRIM) 100 MG tablet Take 1 tablet (100 mg total) by mouth 2 (two) times daily. Patient taking differently: Take 100 mg by mouth daily. 02/17/23   Bradd Canary, MD  ALPRAZolam Prudy Feeler) 0.25 MG tablet Take 1 tablet (0.25 mg total) by mouth 2 (two) times daily as needed for anxiety. 03/04/21   Bradd Canary, MD  atorvastatin (LIPITOR) 40 MG tablet Take 1 tablet (40 mg total) by mouth daily. 02/17/23   Bradd Canary, MD  beclomethasone (BECONASE-AQ) 42 MCG/SPRAY nasal spray Place 1 spray into both nostrils 2 (two) times daily. Dose is for each nostril. 01/23/14   Bradd Canary, MD  cholecalciferol (VITAMIN D3) 25 MCG (1000 UNIT) tablet Take 2,000 Units by mouth daily. 06/20/21   [provider]  colchicine 0.6 MG tablet TAKE 2 TABLETS BY MOUTH ONCE THEN 1 TABLET BY MOUTH EVERY 2 HOURS AS NEEDED PAIN TIL PAIN GONE, MAXIMUM OF 6 TABLETS IN 24 HOURS OR INTOLERABLE DIARRHEA Patient not taking: Reported on 07/15/2023 04/08/22   Bradd Canary, MD  cyclobenzaprine (FLEXERIL) 10 MG tablet TAKE ONE-HALF TO ONE TABLET BY MOUTH TWICE DAILY AS NEEDED FOR MUSCLE SPASM 09/24/22   Bradd Canary, MD  fentaNYL (DURAGESIC) 25 MCG/HR Place 1 patch onto the skin every 3 (three) days. 07/24/23   Eulis Foster, FNP  FLUoxetine [facility])  10 MG capsule Take 1 capsule (10 mg total) by mouth daily. Patient not taking: Reported on 07/15/2023 04/08/23   Bradd Canary, MD  lisinopril (ZESTRIL) 10 MG tablet TAKE ONE TABLET BY MOUTH ONE TIME DAILY 11/18/22   Bradd Canary, MD  metoprolol succinate (TOPROL-XL) 25 MG 24 hr tablet TAKE ONE TABLET BY MOUTH ONE TIME DAILY 03/09/23   Bradd Canary, MD  nystatin cream (MYCOSTATIN) Apply 1 Application topically 2 (two) times daily. Patient not taking: Reported on 07/15/2023 08/18/22   Bradd Canary, MD  Oxycodone HCl 20 MG TABS Take 1 tablet (20 mg total) by mouth 4 (four) times daily as  needed. 07/24/23   Eulis Foster, FNP  Semaglutide-Weight Management (WEGOVY) 0.25 MG/0.5ML SOAJ Inject 0.25 mg into the skin once a week. 10/15/21   Bradd Canary, MD  traZODone (DESYREL) 100 MG tablet Take 1 tablet (100 mg total) by mouth at bedtime as needed for sleep. 04/16/23   Bradd Canary, MD    Current Outpatient Medications  Medication Sig Dispense Refill   allopurinol (ZYLOPRIM) 100 MG tablet Take 1 tablet (100 mg total) by mouth 2 (two) times daily. (Patient taking differently: Take 100 mg by mouth daily.) 30 tablet 4   ALPRAZolam (XANAX) 0.25 MG tablet Take 1 tablet (0.25 mg total) by mouth 2 (two) times daily as needed for anxiety. 5 tablet 1   atorvastatin (LIPITOR) 40 MG tablet Take 1 tablet (40 mg total) by mouth daily. 90 tablet 3   beclomethasone (BECONASE-AQ) 42 MCG/SPRAY nasal spray Place 1 spray into both nostrils 2 (two) times daily. Dose is for each nostril. 25 g 1   cholecalciferol (VITAMIN D3) 25 MCG (1000 UNIT) tablet Take 2,000 Units by mouth daily.     colchicine 0.6 MG tablet TAKE 2 TABLETS BY MOUTH ONCE THEN 1 TABLET BY MOUTH EVERY 2 HOURS AS NEEDED PAIN TIL PAIN GONE, MAXIMUM OF 6 TABLETS IN 24 HOURS OR INTOLERABLE DIARRHEA (Patient not taking: Reported on 07/15/2023) 6 tablet 2   cyclobenzaprine (FLEXERIL) 10 MG tablet TAKE ONE-HALF TO ONE TABLET BY MOUTH TWICE DAILY AS NEEDED FOR MUSCLE SPASM 40 tablet 2   fentaNYL (DURAGESIC) 25 MCG/HR Place 1 patch onto the skin every 3 (three) days. 10 patch 0   FLUoxetine (PROZAC) 10 MG capsule Take 1 capsule (10 mg total) by mouth daily. (Patient not taking: Reported on 07/15/2023) 90 capsule 1   lisinopril (ZESTRIL) 10 MG tablet TAKE ONE TABLET BY MOUTH ONE TIME DAILY 90 tablet 1   metoprolol succinate (TOPROL-XL) 25 MG 24 hr tablet TAKE ONE TABLET BY MOUTH ONE TIME DAILY 90 tablet 1   nystatin cream (MYCOSTATIN) Apply 1 Application topically 2 (two) times daily. (Patient not taking: Reported on 07/15/2023) 30 g 02    Oxycodone HCl 20 MG TABS Take 1 tablet (20 mg total) by mouth 4 (four) times daily as needed. 120 tablet 0   Semaglutide-Weight Management (WEGOVY) 0.25 MG/0.5ML SOAJ Inject 0.25 mg into the skin once a week. 2 mL 0   traZODone (DESYREL) 100 MG tablet Take 1 tablet (100 mg total) by mouth at bedtime as needed for sleep. 90 tablet 1   Current Facility-Administered Medications  Medication Dose Route Frequency Provider Last Rate Last Admin   0.9 %  sodium chloride infusion  500 mL Intravenous Once Syvanna Ciolino V, DO        Allergies as of 08/05/2023 - Review Complete 08/05/2023  Allergen Reaction Noted  Cymbalta [duloxetine hcl] Other (See Comments) 06/18/2011   Fiorinal [butalbital-aspirin-caffeine] Other (See Comments) 06/07/2012    Family History  Problem Relation Age of Onset   Colon cancer Mother    Breast cancer Mother        benign   Rheum arthritis Father    Diabetes Father    Congestive Heart Failure Father    Hypertension Father    Stroke Father    Hip fracture Maternal Grandmother    COPD Maternal Grandfather 75   Colon cancer Maternal Grandfather    Lymphoma Paternal Grandmother    Colon cancer Maternal Aunt    Colon cancer Maternal Uncle 76   Breast cancer Cousin        negative stage 3   Breast cancer Maternal Great-grandmother    ADD / ADHD Neg Hx    Depression Neg Hx    Alcohol abuse Neg Hx    Drug abuse Neg Hx    Esophageal cancer Neg Hx     Social History   Socioeconomic History   Marital status: Divorced    Spouse name: Not on file   Number of children: Not on file   Years of education: Not on file   Highest education level: Not on file  Occupational History   Not on file  Tobacco Use   Smoking status: Former    Current packs/day: 0.00    Types: Cigarettes    Quit date: 04/04/1989    Years since quitting: 34.3   Smokeless tobacco: Never  Vaping Use   Vaping status: Never Used  Substance and Sexual Activity   Alcohol use: Yes    Comment:  occassionally- 2 glassses of wine 2x/month   Drug use: No    Comment: take meds as prescribed   Sexual activity: Not Currently  Other Topics Concern   Not on file  Social History Narrative   Not on file   Social Determinants of Health   Financial Resource Strain: Not on file  Food Insecurity: Not on file  Transportation Needs: Not on file  Physical Activity: Not on file  Stress: Not on file  Social Connections: Not on file  Intimate Partner Violence: Not on file    Physical Exam: Vital signs in last 24 hours: @BP  (!) 141/69   Pulse 61   Temp (!) 97.3 F (36.3 C) (Temporal)   Ht 5\' 5"  (1.651 m)   Wt 246 lb (111.6 kg)   SpO2 97%   BMI 40.94 kg/m  GEN: NAD EYE: Sclerae anicteric ENT: MMM CV: Non-tachycardic Pulm: CTA b/l GI: Soft, NT/ND NEURO:  Alert & Oriented x 3   Doristine Locks, DO [facility]   08/05/2023 1:58 PM

## 2023-08-06 ENCOUNTER — Telehealth: Payer: Self-pay

## 2023-08-06 NOTE — Telephone Encounter (Signed)
Follow up call to pt, no answer. 

## 2023-08-11 LAB — SURGICAL PATHOLOGY

## 2023-08-20 ENCOUNTER — Ambulatory Visit: Payer: Medicaid Other | Admitting: Family Medicine

## 2023-08-24 ENCOUNTER — Other Ambulatory Visit: Payer: Self-pay | Admitting: Family Medicine

## 2023-08-24 ENCOUNTER — Ambulatory Visit (INDEPENDENT_AMBULATORY_CARE_PROVIDER_SITE_OTHER): Payer: Medicaid Other | Admitting: Family Medicine

## 2023-08-24 ENCOUNTER — Encounter: Payer: Self-pay | Admitting: Family Medicine

## 2023-08-24 ENCOUNTER — Telehealth: Payer: Self-pay

## 2023-08-24 ENCOUNTER — Telehealth: Payer: Self-pay | Admitting: Family Medicine

## 2023-08-24 ENCOUNTER — Other Ambulatory Visit (HOSPITAL_COMMUNITY): Payer: Self-pay

## 2023-08-24 ENCOUNTER — Ambulatory Visit: Payer: Medicaid Other | Admitting: Family Medicine

## 2023-08-24 VITALS — BP 127/43 | HR 68 | Ht 65.0 in | Wt 240.0 lb

## 2023-08-24 DIAGNOSIS — E559 Vitamin D deficiency, unspecified: Secondary | ICD-10-CM | POA: Diagnosis not present

## 2023-08-24 DIAGNOSIS — I10 Essential (primary) hypertension: Secondary | ICD-10-CM | POA: Diagnosis not present

## 2023-08-24 DIAGNOSIS — E785 Hyperlipidemia, unspecified: Secondary | ICD-10-CM

## 2023-08-24 DIAGNOSIS — Z0000 Encounter for general adult medical examination without abnormal findings: Secondary | ICD-10-CM | POA: Diagnosis not present

## 2023-08-24 DIAGNOSIS — M1A9XX0 Chronic gout, unspecified, without tophus (tophi): Secondary | ICD-10-CM | POA: Diagnosis not present

## 2023-08-24 DIAGNOSIS — Z1231 Encounter for screening mammogram for malignant neoplasm of breast: Secondary | ICD-10-CM

## 2023-08-24 LAB — TSH: TSH: 1.62 u[IU]/mL (ref 0.35–5.50)

## 2023-08-24 LAB — COMPREHENSIVE METABOLIC PANEL
ALT: 20 U/L (ref 0–35)
AST: 17 U/L (ref 0–37)
Albumin: 4.2 g/dL (ref 3.5–5.2)
Alkaline Phosphatase: 65 U/L (ref 39–117)
BUN: 12 mg/dL (ref 6–23)
CO2: 31 meq/L (ref 19–32)
Calcium: 10.5 mg/dL (ref 8.4–10.5)
Chloride: 101 meq/L (ref 96–112)
Creatinine, Ser: 0.58 mg/dL (ref 0.40–1.20)
GFR: 97.39 mL/min (ref >60.00)
Glucose, Bld: 104 mg/dL — ABNORMAL HIGH (ref 70–99)
Potassium: 4.7 meq/L (ref 3.5–5.1)
Sodium: 139 meq/L (ref 135–145)
Total Bilirubin: 0.6 mg/dL (ref 0.2–1.2)
Total Protein: 6.5 g/dL (ref 6.0–8.3)

## 2023-08-24 LAB — LIPID PANEL
Cholesterol: 139 mg/dL (ref 0–200)
HDL: 42.6 mg/dL (ref >39.00)
LDL Cholesterol: 73 mg/dL (ref 0–99)
NonHDL: 96.63
Total CHOL/HDL Ratio: 3
Triglycerides: 119 mg/dL (ref 0.0–149.0)
VLDL: 23.8 mg/dL (ref 0.0–40.0)

## 2023-08-24 LAB — VITAMIN D 25 HYDROXY (VIT D DEFICIENCY, FRACTURES): VITD: 36.05 ng/mL (ref 30.00–100.00)

## 2023-08-24 LAB — URIC ACID: Uric Acid, Serum: 5.7 mg/dL (ref 2.4–7.0)

## 2023-08-24 MED ORDER — FENTANYL 25 MCG/HR TD PT72: 1.0000 | MEDICATED_PATCH | TRANSDERMAL | 0 refills | Status: DC

## 2023-08-24 MED ORDER — OXYCODONE HCL 20 MG PO TABS: 1.0000 | ORAL_TABLET | Freq: Four times a day (QID) | ORAL | 0 refills | Status: DC | PRN

## 2023-08-24 NOTE — Telephone Encounter (Signed)
Prescription Request  08/24/2023  Is this a "Controlled Substance" medicine? Yes  LOV: Visit date not found (pt saw Hyman Hopes  today)  What is the name of the medication or equipment?   fentaNYL (DURAGESIC) 25 MCG/HR   Oxycodone HCl 20 MG TABS   Have you contacted your pharmacy to request a refill? No   Which pharmacy would you like this sent to?    [facility] [address] - [place], [place] - [address], [address] [place] [address] Phone: [phone] Fax: [phone]    Patient notified that their request is being sent to the clinical staff for review and that they should receive a response within 2 business days.   Please advise at Mobile [phone] (mobile)

## 2023-08-24 NOTE — Telephone Encounter (Signed)
Oxycodone and Fentanyl last written on 07/24/2023 for one month no refills.   Copying Dr. Abner Greenspan.

## 2023-08-24 NOTE — Telephone Encounter (Signed)
Pharmacy Patient Advocate Encounter   Received notification from CoverMyMeds that prior authorization for oxyCODONE HCl 20MG  tablets is required/requested.   Insurance verification completed.   The patient is insured through [facility] .   Per test claim: PA required; PA started via CoverMyMeds. KEY [id number] . Waiting for clinical questions to populate.

## 2023-08-24 NOTE — Progress Notes (Signed)
Complete physical exam  Patient: [name] [name]   DOB: 01/18/1961   62 y.o. Female  MRN: [id number]  Subjective:    Chief Complaint  Patient presents with   Annual Exam    [name] [name] is a 62 y.o. female who presents today for a complete physical exam. She reports consuming a general diet (low fat, high protein). The patient does not participate in regular exercise at present. She generally feels well. She reports sleeping fairly well. She does not have additional problems to discuss today.   Currently lives with: roommate Acute concerns or interim problems since last visit: no  Vision concerns: no Dental concerns: sees dentist regularly, needs oral surgery for a growth on her gum  ETOH use: maybe 4 servings per week Nicotine use: no Recreational drugs/illegal substances: no    Discussed the use of AI scribe software for clinical note transcription with the patient, who gave verbal consent to proceed.  History of Present Illness   The patient, with a history of obesity and back issues, has been self-administering semaglutide since October, sourced from a compound pharmacy in [place]. They report a significant improvement in their overall health, including a reduction in knee pain, which they attribute to weight loss. The patient has been following a high-protein, low-fat diet, with limited fruit and vegetable intake. They have also started taking Metamucil for fiber supplementation. They report a decrease in appetite and food intake since starting semaglutide, initially experiencing loose stools, which have since normalized.  The patient also reports a growth on their gum, which has been present for many years and has recently started to grow over a tooth. They have been delaying oral surgery to address this issue, but is planning to schedule soon. The patient consumes alcohol, approximately four servings per week, and denies use of nicotine or recreational drugs.  The patient has  been experiencing panic attacks and has a history of PTSD. They have been taking Xanax 1mg , sourced from their roommate, almost nightly for sleep. They also take Trazodone. They have expressed a desire to increase their prescribed Xanax dosage.  The patient has been taking oxycodone for back pain, with the last dose typically taken around 8 PM. They have also been taking atorvastatin and allopurinol, which they hope to reduce in the future. They have recently been given Ozempic (semaglutide) 2mg  pens by an acquaintance, which they have been self-adjusting to a lower dose.        Most recent fall risk assessment:    08/24/2023    1:39 PM  Fall Risk   Falls in the past year? 0  Number falls in past yr: 0  Injury with Fall? 0  Risk for fall due to : No Fall Risks  Follow up Falls evaluation completed     Most recent depression screenings:    08/24/2023    1:39 PM 02/16/2023    4:13 PM  PHQ 2/9 Scores  PHQ - 2 Score 0 0  PHQ- 9 Score  0            Patient Care Team: Bradd Canary, MD as PCP - General (Family Medicine)   Outpatient Medications Prior to Visit  Medication Sig   allopurinol (ZYLOPRIM) 100 MG tablet Take 1 tablet (100 mg total) by mouth 2 (two) times daily. (Patient taking differently: Take 100 mg by mouth daily.)   atorvastatin (LIPITOR) 40 MG tablet Take 1 tablet (40 mg total) by mouth daily.   beclomethasone (BECONASE-AQ)  42 MCG/SPRAY nasal spray Place 1 spray into both nostrils 2 (two) times daily. Dose is for each nostril.   cholecalciferol (VITAMIN D3) 25 MCG (1000 UNIT) tablet Take 2,000 Units by mouth daily.   cyclobenzaprine (FLEXERIL) 10 MG tablet TAKE ONE-HALF TO ONE TABLET BY MOUTH TWICE DAILY AS NEEDED FOR MUSCLE SPASM   fentaNYL (DURAGESIC) 25 MCG/HR Place 1 patch onto the skin every 3 (three) days.   lisinopril (ZESTRIL) 10 MG tablet TAKE ONE TABLET BY MOUTH ONE TIME DAILY   metoprolol succinate (TOPROL-XL) 25 MG 24 hr tablet TAKE ONE TABLET BY  MOUTH ONE TIME DAILY   nystatin cream (MYCOSTATIN) Apply 1 Application topically 2 (two) times daily.   Oxycodone HCl 20 MG TABS Take 1 tablet (20 mg total) by mouth 4 (four) times daily as needed.   Semaglutide-Weight Management (WEGOVY) 0.25 MG/0.5ML SOAJ Inject 0.25 mg into the skin once a week.   Semaglutide-Weight Management 0.5 MG/0.5ML SOAJ Inject 0.5 mg into the skin once a week.   traZODone (DESYREL) 100 MG tablet Take 1 tablet (100 mg total) by mouth at bedtime as needed for sleep.   ALPRAZolam (XANAX) 0.25 MG tablet Take 1 tablet (0.25 mg total) by mouth 2 (two) times daily as needed for anxiety. (Patient not taking: Reported on 08/24/2023)   colchicine 0.6 MG tablet TAKE 2 TABLETS BY MOUTH ONCE THEN 1 TABLET BY MOUTH EVERY 2 HOURS AS NEEDED PAIN TIL PAIN GONE, MAXIMUM OF 6 TABLETS IN 24 HOURS OR INTOLERABLE DIARRHEA (Patient not taking: Reported on 07/15/2023)   [DISCONTINUED] FLUoxetine (PROZAC) 10 MG capsule Take 1 capsule (10 mg total) by mouth daily. (Patient not taking: Reported on 07/15/2023)   No facility-administered medications prior to visit.    ROS All review of systems negative except what is listed in the HPI        Objective:     BP (!) 127/43   Pulse 68   Ht 5\' 5"  (1.651 m)   Wt 240 lb (108.9 kg)   SpO2 97%   BMI 39.94 kg/m    Physical Exam Vitals reviewed.  Constitutional:      General: She is not in acute distress.    Appearance: Normal appearance. She is obese. She is not ill-appearing.  HENT:     Head: Normocephalic and atraumatic.     Right Ear: Tympanic membrane normal.     Left Ear: Tympanic membrane normal.     Nose: Nose normal.     Mouth/Throat:     Mouth: Mucous membranes are moist.     Pharynx: Oropharynx is clear.  Eyes:     Extraocular Movements: Extraocular movements intact.     Conjunctiva/sclera: Conjunctivae normal.     Pupils: Pupils are equal, round, and reactive to light.  Cardiovascular:     Rate and Rhythm: Normal  rate and regular rhythm.     Pulses: Normal pulses.     Heart sounds: Normal heart sounds.  Pulmonary:     Effort: Pulmonary effort is normal.     Breath sounds: Normal breath sounds.  Abdominal:     General: Abdomen is flat. Bowel sounds are normal. There is no distension.     Palpations: Abdomen is soft. There is no mass.     Tenderness: There is no abdominal tenderness. There is no right CVA tenderness, left CVA tenderness, guarding or rebound.  Genitourinary:    Comments: Deferred exam Musculoskeletal:        General: Normal range of motion.  Cervical back: Normal range of motion and neck supple. No tenderness.     Right lower leg: No edema.     Left lower leg: No edema.  Lymphadenopathy:     Cervical: No cervical adenopathy.  Skin:    General: Skin is warm and dry.     Capillary Refill: Capillary refill takes less than 2 seconds.  Neurological:     General: No focal deficit present.     Mental Status: She is alert and oriented to person, place, and time. Mental status is at baseline.  Psychiatric:        Mood and Affect: Mood normal.        Behavior: Behavior normal.        Thought Content: Thought content normal.        Judgment: Judgment normal.            No results found for any visits on 08/24/23.     Assessment & Plan:    Routine Health Maintenance and Physical Exam Discussed health promotion and safety including diet and exercise recommendations, dental health, and injury prevention. Tobacco cessation if applicable. Seat belts, sunscreen, smoke detectors, etc.    Immunization History  Administered Date(s) Administered   Influenza Inj Mdck Quad Pf 07/01/2019   Influenza Split 08/08/2011   Influenza Whole 08/22/2009, 07/01/2010   Influenza,inj,Quad PF,6+ Mos 07/06/2017, 08/10/2018, 08/28/2020, 08/18/2022   Moderna Sars-Covid-2 Vaccination 05/24/2020, 06/21/2020, 11/21/2020   Td 01/09/2009   Tdap 08/28/2020   Zoster, Live 09/27/2015    Health  Maintenance  Topic Date Due   Zoster Vaccines- Shingrix (1 of 2) 10/24/1980   MAMMOGRAM  04/05/2021   INFLUENZA VACCINE  04/30/2023   COVID-19 Vaccine (4 - 2023-24 season) 09/06/2023 (Originally 05/31/2023)   Colonoscopy  08/04/2026   Cervical Cancer Screening (HPV/Pap Cotest)  12/19/2026   DTaP/Tdap/Td (3 - Td or Tdap) 08/28/2030   Hepatitis C Screening  Completed   HPV VACCINES  Aged Out   HIV Screening  Discontinued        Problem List Items Addressed This Visit       Active Problems   Hyperlipidemia   Relevant Orders   Lipid panel   CBC with Differential/Platelet   Essential hypertension   Relevant Orders   CBC with Differential/Platelet   TSH   Comprehensive metabolic panel   Vitamin D deficiency   Relevant Orders   VITAMIN D 25 Hydroxy (Vit-D Deficiency, Fractures)   Breast cancer screening   Relevant Orders   MM DIGITAL SCREENING BILATERAL   Gout   Relevant Orders   Uric acid   Other Visit Diagnoses     Annual physical exam    -  Primary   Relevant Orders   Lipid panel   CBC with Differential/Platelet   TSH   Comprehensive metabolic panel          Weight Management Patient reports using semaglutide obtained from a compound pharmacy in [place] and has switched to a high protein, low fat diet. Reports decreased appetite and improved knee pain since starting semaglutide. Also taking Metamucil for fiber supplementation. -Monitor for any adverse effects from semaglutide. Advised caution using compounded semaglutide and trying to self-adjust Ozempic pens. This is not advised.    Anxiety/Insomnia Patient reports taking Xanax obtained from roommate for sleep and panic attacks. Also taking Trazodone. Patient has requested an increase in Xanax prescription. -Discussed risks with higher dose benzos especially while on her other chronic meds. Forward note to primary provider for  review. Advised she not use other people's prescriptions. Patient pleasant and  understanding.         Return in about 6 months (around 02/21/2024) for routine follow-up.     Clayborne Dana, NP

## 2023-08-25 LAB — CBC WITH DIFFERENTIAL/PLATELET
Basophils Absolute: 0.1 10*3/uL (ref 0.0–0.1)
Basophils Relative: 1.1 % (ref 0.0–3.0)
Eosinophils Absolute: 0.2 10*3/uL (ref 0.0–0.7)
Eosinophils Relative: 3.3 % (ref 0.0–5.0)
HCT: 43.4 % (ref 36.0–46.0)
Hemoglobin: 14 g/dL (ref 12.0–15.0)
Lymphocytes Relative: 26.7 % (ref 12.0–46.0)
Lymphs Abs: 1.8 10*3/uL (ref 0.7–4.0)
MCHC: 32.3 g/dL (ref 30.0–36.0)
MCV: 93.9 fL (ref 78.0–100.0)
Monocytes Absolute: 0.4 10*3/uL (ref 0.1–1.0)
Monocytes Relative: 6.1 % (ref 3.0–12.0)
Neutro Abs: 4.3 10*3/uL (ref 1.4–7.7)
Neutrophils Relative %: 62.8 % (ref 43.0–77.0)
Platelets: 184 10*3/uL (ref 150.0–400.0)
RBC: 4.62 Mil/uL (ref 3.87–5.11)
RDW: 13.7 % (ref 11.5–15.5)
WBC: 6.9 10*3/uL (ref 4.0–10.5)

## 2023-09-03 ENCOUNTER — Other Ambulatory Visit: Payer: Self-pay | Admitting: Family Medicine

## 2023-09-04 ENCOUNTER — Encounter: Payer: Self-pay | Admitting: Family Medicine

## 2023-09-08 ENCOUNTER — Other Ambulatory Visit: Payer: Self-pay | Admitting: Family

## 2023-09-08 MED ORDER — SEMAGLUTIDE (1 MG/DOSE) 4 MG/3ML ~~LOC~~ SOPN: 1.0000 mg | PEN_INJECTOR | SUBCUTANEOUS | 0 refills | Status: DC

## 2023-09-08 NOTE — Telephone Encounter (Signed)
Spoke with patient. She will need the 1 mg, she is currently taking .75

## 2023-09-10 ENCOUNTER — Other Ambulatory Visit (HOSPITAL_COMMUNITY): Payer: Self-pay

## 2023-09-10 ENCOUNTER — Telehealth: Payer: Self-pay | Admitting: Family Medicine

## 2023-09-10 NOTE — Telephone Encounter (Signed)
Jessica with [facility] called to speak with nurse of pcp for clarification on the patient's diagnosis regarding a PA they received for an Opioid. Please call and advise at [phone].

## 2023-09-10 NOTE — Telephone Encounter (Signed)
PA expired. Resubmitted:   Pharmacy Patient Advocate Encounter   Received notification from CoverMyMeds that prior authorization for oxyCODONE HCl 20MG  tablets  is required/requested.   Insurance verification completed.   The patient is insured through [facility] .   Per test claim: PA required; PA submitted to above mentioned insurance via CoverMyMeds Key/confirmation #/EOC [id number] Status is pending

## 2023-09-11 NOTE — Telephone Encounter (Signed)
 Called Jessica back. No answer. LVM

## 2023-09-14 ENCOUNTER — Other Ambulatory Visit: Payer: Self-pay | Admitting: Family Medicine

## 2023-09-14 ENCOUNTER — Other Ambulatory Visit (HOSPITAL_COMMUNITY): Payer: Self-pay

## 2023-09-14 MED ORDER — FENTANYL 25 MCG/HR TD PT72: 1.0000 | MEDICATED_PATCH | TRANSDERMAL | 0 refills | Status: DC

## 2023-09-14 MED ORDER — OXYCODONE HCL 20 MG PO TABS: 1.0000 | ORAL_TABLET | Freq: Four times a day (QID) | ORAL | 0 refills | Status: DC | PRN

## 2023-09-14 NOTE — Telephone Encounter (Signed)
Spoke with Shanda Bumps the pharmacist at [facility] and she stated that the prior auth was denied.  The dx was cancer and I advised that she takes they oxycodone for chronic pain-lower back pain/radiculapathy.  She will approved for 6 months and we will get another fax with approval.

## 2023-09-14 NOTE — Telephone Encounter (Signed)
In another telephone it looks like the PA team denied the request for Oxycodone

## 2023-09-14 NOTE — Telephone Encounter (Signed)
Pharmacy Patient Advocate Encounter  Received notification from [facility] that Prior Authorization for OXYCODONE 20MG  has been DENIED.  Full denial letter will be uploaded to the media tab. See denial reason below.   PA #/Case ID/Reference #: [id number]

## 2023-09-14 NOTE — Telephone Encounter (Signed)
Pharmacy Patient Advocate Encounter  Received notification from [facility] that Prior Authorization for Oxycodone 20mg  has been APPROVED from 09/14/23 to 03/14/24. Ran test claim, Copay is $4.00. This test claim was processed through [facility]- copay amounts may vary at other pharmacies due to pharmacy/plan contracts, or as the patient moves through the different stages of their insurance plan.  To be noted pt just received 08/25/23 for 30 days so it's too soon on fill until 09/22/23.

## 2023-09-15 NOTE — Telephone Encounter (Signed)
See prev note

## 2023-09-15 NOTE — Telephone Encounter (Signed)
Maureen with [facility] called to get clarification on semaglutide. She will fax the request over to our office.

## 2023-09-15 NOTE — Telephone Encounter (Signed)
[facility] called to ask if the Ozempic could be switched to a compounded form. She stated that they are a compound pharmacy. Please call and advise.

## 2023-09-16 ENCOUNTER — Inpatient Hospital Stay (HOSPITAL_BASED_OUTPATIENT_CLINIC_OR_DEPARTMENT_OTHER): Admission: RE | Admit: 2023-09-16 | Payer: Medicaid Other | Source: Ambulatory Visit

## 2023-09-16 ENCOUNTER — Telehealth: Payer: Self-pay | Admitting: Emergency Medicine

## 2023-09-16 ENCOUNTER — Other Ambulatory Visit (HOSPITAL_BASED_OUTPATIENT_CLINIC_OR_DEPARTMENT_OTHER): Payer: Medicaid Other

## 2023-09-16 NOTE — Telephone Encounter (Signed)
Called patient and explained that we have to wait for Dr. Abner Greenspan to approve the compound mixture

## 2023-09-16 NOTE — Telephone Encounter (Signed)
Opened in error

## 2023-09-16 NOTE — Telephone Encounter (Signed)
Copied from CRM [id number]. Topic: Clinical - Prescription Issue >> Sep 16, 2023  3:33 PM Gaetano Hawthorne wrote: Reason for CRM: Patient is calling to inform the office that the [facility] will be sending over some paperwork to have the Semaglutide changed to a compounded version of Semaglutide with Cyanacobalamin. If the provider has any questions, they can call [phone].  Contact Center agent noted that the office has been in contact with the pharmacy and they were also advised that they would be sending something over to the pharmacy. Please call the patient when you have some additional information as she is desperate to get this medication filled.

## 2023-09-16 NOTE — Telephone Encounter (Signed)
Copied from CRM [id number]. Topic: Clinical - Prescription Issue >> Sep 16, 2023  3:07 PM [name] [name] wrote: Reason for CRM: Patient wants to speak with nurse she spoke with on yesterday regarding Semaglutide (weigh loss) medication being [place] state pharmacy. Patient wants to know if nurse has made an progress with medication because she has been trying for a week with the pharmacy.

## 2023-09-17 ENCOUNTER — Telehealth: Payer: Self-pay

## 2023-09-17 ENCOUNTER — Encounter: Payer: Self-pay | Admitting: Family Medicine

## 2023-09-17 NOTE — Telephone Encounter (Signed)
See duplicate TE.

## 2023-09-17 NOTE — Telephone Encounter (Signed)
Pt called back and stated that she will need 0.75 mg for 1 week. Then she will move up to 1.0 x 4 weeks.    Form asks to clarify strength:  Semaglutide/ Cyanocobalamin 1/0.5 mg/ mL Semaglutide/ Cyanocobalamin 5/0.5 mg/mL   Vial size to dispense:  1 mL or 2.5 mL  Please clarify directions with dose in mL OR  Confirm the mg dose for Semaglutide/ Cyanocobalamin refers to the Semaglutide component. Is it okay to convert to ml/units?   Rx goes to: [facility] [address], [place], [place], [address]

## 2023-09-17 NOTE — Telephone Encounter (Signed)
Copied from CRM [id number]. Topic: Clinical - Medication Question >> Sep 17, 2023  2:55 PM Deaijah H wrote: Reason for CRM: Patient called in to speak to Juanetta due to sent prescription in written wrong , would like update on prescription / please call [phone]

## 2023-09-18 ENCOUNTER — Telehealth: Payer: Self-pay | Admitting: *Deleted

## 2023-09-18 MED ORDER — NONFORMULARY OR COMPOUNDED ITEM: 0 refills | Status: DC

## 2023-09-18 NOTE — Telephone Encounter (Signed)
Opened in error

## 2023-09-18 NOTE — Telephone Encounter (Signed)
Spoke with Olegario Messier at [facility].  Advised her that pt has to take 0.75mg  for one more week and then start 1.0mg .  She staed that they can can do 5mg  semaglutide and 0.5,ml of cyanocobalamin.  They will correct this rx.  Usually 1ml vial comes with 5 doses.  Rx put in [facility] for further refills.

## 2023-09-18 NOTE — Addendum Note (Signed)
Addended by: Thelma Barge D on: 09/18/2023 09:37 AM   Modules accepted: Orders

## 2023-09-18 NOTE — Addendum Note (Signed)
Addended by: Thelma Barge D on: 09/18/2023 10:03 AM   Modules accepted: Orders

## 2023-09-22 ENCOUNTER — Other Ambulatory Visit: Payer: Self-pay | Admitting: Family Medicine

## 2023-09-24 ENCOUNTER — Encounter: Payer: Self-pay | Admitting: Family Medicine

## 2023-09-24 ENCOUNTER — Telehealth: Payer: Self-pay

## 2023-09-24 NOTE — Telephone Encounter (Signed)
FYI  Spoke with pharmacist and she stated that they went over again on how to use and injection medication again.  Refills given to pharmacist.

## 2023-09-24 NOTE — Telephone Encounter (Unsigned)
Copied from CRM [id number]. Topic: Clinical - Prescription Issue >> Sep 24, 2023  1:39 PM [name] [name] wrote: Reason for CRM: [name] [name], with [facility] called to state that the PT rx Semaglutide 5mg  and cyancobalamin 0.69ml  Take 1ml once a week  needs to be approved for refill, due to the fact that the pts rx  instruction was for 0 .75mg  for one more week and then start 1.0mg .  1 The PT felt that  The dosage was wrong so she ended up injecting herself with 3.75 mg, mira stated no side effects were experienced, but since she accidentally injected herself with too much, she only has enough for 1 more week.   Callback #  [id number]  & reference # [id number]

## 2023-09-24 NOTE — Telephone Encounter (Signed)
Copied from CRM [id number]. Topic: Clinical - Medical Advice >> Sep 24, 2023  2:30 PM Fredrich Romans wrote: Reason for CRM: patient would like to know if she is going to be prescribed more Semaglutide medication ,since she injected herself with the wrong dosage of semaglutide?She said that she only has .25 left. She did not report any issues or state that she is feeling sick from the over dosage. She would like to just know is more going to be sent into pharmacy for her?

## 2023-09-25 NOTE — Telephone Encounter (Signed)
Called and spoke with patient. Instructions were given per provider's note. Patient said everything has been resolved.

## 2023-09-25 NOTE — Telephone Encounter (Signed)
Spoke with patient and everything has been resolved

## 2023-10-15 ENCOUNTER — Other Ambulatory Visit: Payer: Self-pay | Admitting: Family Medicine

## 2023-10-15 ENCOUNTER — Encounter: Payer: Self-pay | Admitting: Family Medicine

## 2023-10-16 MED ORDER — OXYCODONE HCL 20 MG PO TABS: 1.0000 | ORAL_TABLET | Freq: Four times a day (QID) | ORAL | 0 refills | Status: DC | PRN

## 2023-11-09 ENCOUNTER — Encounter (HOSPITAL_BASED_OUTPATIENT_CLINIC_OR_DEPARTMENT_OTHER): Payer: Self-pay

## 2023-11-09 ENCOUNTER — Other Ambulatory Visit: Payer: Self-pay

## 2023-11-09 DIAGNOSIS — K863 Pseudocyst of pancreas: Secondary | ICD-10-CM | POA: Diagnosis not present

## 2023-11-09 DIAGNOSIS — D72829 Elevated white blood cell count, unspecified: Secondary | ICD-10-CM | POA: Diagnosis not present

## 2023-11-09 DIAGNOSIS — R1013 Epigastric pain: Secondary | ICD-10-CM | POA: Diagnosis not present

## 2023-11-09 DIAGNOSIS — I10 Essential (primary) hypertension: Secondary | ICD-10-CM | POA: Diagnosis not present

## 2023-11-09 DIAGNOSIS — E039 Hypothyroidism, unspecified: Secondary | ICD-10-CM | POA: Insufficient documentation

## 2023-11-09 DIAGNOSIS — R109 Unspecified abdominal pain: Secondary | ICD-10-CM | POA: Diagnosis not present

## 2023-11-09 LAB — COMPREHENSIVE METABOLIC PANEL
ALT: 18 U/L (ref 0–44)
AST: 20 U/L (ref 15–41)
Albumin: 4.5 g/dL (ref 3.5–5.0)
Alkaline Phosphatase: 69 U/L (ref 38–126)
Anion gap: 14 (ref 5–15)
BUN: 16 mg/dL (ref 8–23)
CO2: 25 mmol/L (ref 22–32)
Calcium: 11.2 mg/dL — ABNORMAL HIGH (ref 8.9–10.3)
Chloride: 101 mmol/L (ref 98–111)
Creatinine, Ser: 0.79 mg/dL (ref 0.44–1.00)
GFR, Estimated: >60 mL/min (ref >60)
Glucose, Bld: 151 mg/dL — ABNORMAL HIGH (ref 70–99)
Potassium: 3.6 mmol/L (ref 3.5–5.1)
Sodium: 140 mmol/L (ref 135–145)
Total Bilirubin: 0.9 mg/dL (ref 0.0–1.2)
Total Protein: 8.2 g/dL — ABNORMAL HIGH (ref 6.5–8.1)

## 2023-11-09 LAB — CBC
HCT: 48.5 % — ABNORMAL HIGH (ref 36.0–46.0)
Hemoglobin: 16.4 g/dL — ABNORMAL HIGH (ref 12.0–15.0)
MCH: 30.3 pg (ref 26.0–34.0)
MCHC: 33.8 g/dL (ref 30.0–36.0)
MCV: 89.6 fL (ref 80.0–100.0)
Platelets: 283 10*3/uL (ref 150–400)
RBC: 5.41 MIL/uL — ABNORMAL HIGH (ref 3.87–5.11)
RDW: 13 % (ref 11.5–15.5)
WBC: 14.6 10*3/uL — ABNORMAL HIGH (ref 4.0–10.5)
nRBC: 0 % (ref 0.0–0.2)

## 2023-11-09 LAB — LIPASE, BLOOD: Lipase: 45 U/L (ref 11–51)

## 2023-11-09 NOTE — ED Triage Notes (Signed)
 Pt reports abdominal pain that began this morning and has had multiple episodes of emesis. Pt reports she is also having back pain which is chronic for her.

## 2023-11-10 ENCOUNTER — Emergency Department (HOSPITAL_BASED_OUTPATIENT_CLINIC_OR_DEPARTMENT_OTHER)
Admission: EM | Admit: 2023-11-10 | Discharge: 2023-11-10 | Disposition: A | Discharge status: Home / Self Care | Payer: Medicaid Other | Attending: Emergency Medicine | Admitting: Emergency Medicine

## 2023-11-10 ENCOUNTER — Other Ambulatory Visit: Payer: Self-pay | Admitting: Family Medicine

## 2023-11-10 ENCOUNTER — Ambulatory Visit: Payer: Self-pay | Admitting: Family Medicine

## 2023-11-10 ENCOUNTER — Emergency Department (HOSPITAL_BASED_OUTPATIENT_CLINIC_OR_DEPARTMENT_OTHER): Payer: Medicaid Other

## 2023-11-10 ENCOUNTER — Telehealth: Payer: Self-pay | Admitting: Family Medicine

## 2023-11-10 DIAGNOSIS — K863 Pseudocyst of pancreas: Secondary | ICD-10-CM

## 2023-11-10 DIAGNOSIS — R112 Nausea with vomiting, unspecified: Secondary | ICD-10-CM

## 2023-11-10 DIAGNOSIS — R109 Unspecified abdominal pain: Secondary | ICD-10-CM | POA: Diagnosis not present

## 2023-11-10 DIAGNOSIS — K5900 Constipation, unspecified: Secondary | ICD-10-CM

## 2023-11-10 DIAGNOSIS — R1013 Epigastric pain: Secondary | ICD-10-CM

## 2023-11-10 MED ORDER — ONDANSETRON HCL 4 MG/2ML IJ SOLN
4.0000 mg | Freq: Once | INTRAMUSCULAR | Status: AC
  Administered 2023-11-10: 4 mg via INTRAVENOUS
  Filled 2023-11-10: qty 2

## 2023-11-10 MED ORDER — MORPHINE SULFATE (PF) 4 MG/ML IV SOLN
4.0000 mg | Freq: Once | INTRAVENOUS | Status: AC
  Administered 2023-11-10: 4 mg via INTRAVENOUS
  Filled 2023-11-10: qty 1

## 2023-11-10 MED ORDER — IOHEXOL 300 MG/ML  SOLN
100.0000 mL | Freq: Once | INTRAMUSCULAR | Status: AC | PRN
Start: 2023-11-10 — End: 2023-11-10
  Administered 2023-11-10: 100 mL via INTRAVENOUS

## 2023-11-10 MED ORDER — OXYCODONE-ACETAMINOPHEN 5-325 MG PO TABS: 2.0000 | ORAL_TABLET | ORAL | 0 refills | Status: DC | PRN

## 2023-11-10 MED ORDER — SODIUM CHLORIDE 0.9 % IV BOLUS
1000.0000 mL | Freq: Once | INTRAVENOUS | Status: AC
  Administered 2023-11-10: 1000 mL via INTRAVENOUS

## 2023-11-10 MED ORDER — ONDANSETRON 8 MG PO TBDP: ORAL_TABLET | ORAL | 0 refills | Status: AC

## 2023-11-10 NOTE — Telephone Encounter (Signed)
Medication filled today. No further action needed.

## 2023-11-10 NOTE — ED Provider Notes (Signed)
[facility] Provider Note   CSN: [id number] Arrival date & time: 11/09/23  2130     History  Chief Complaint  Patient presents with   Emesis   Abdominal Pain    [name] [name] is a 63 y.o. female.  This pain is a 63 year old female with past medical history of hypothyroidism, hyperlipidemia, hypertension.  Patient presenting today for evaluation of abdominal pain.  She reports epigastric discomfort starting earlier this morning that has worsened throughout the day.  The pain is constant and worse when she moves or palpates the area.  She has felt nauseated, but has not vomited.  She denies any constipation or diarrhea.  No fevers or chills.  No alleviating factors.  The history is provided by the patient.       Home Medications Prior to Admission medications   Medication Sig Start Date End Date Taking? Authorizing Provider  allopurinol (ZYLOPRIM) 100 MG tablet TAKE ONE TABLET BY MOUTH TWICE A DAY 09/04/23   Bradd Canary, MD  ALPRAZolam Prudy Feeler) 0.25 MG tablet Take 1 tablet (0.25 mg total) by mouth 2 (two) times daily as needed for anxiety. Patient not taking: Reported on 08/24/2023 03/04/21   Bradd Canary, MD  atorvastatin (LIPITOR) 40 MG tablet Take 1 tablet (40 mg total) by mouth daily. 02/17/23   Bradd Canary, MD  beclomethasone (BECONASE-AQ) 42 MCG/SPRAY nasal spray Place 1 spray into both nostrils 2 (two) times daily. Dose is for each nostril. 01/23/14   Bradd Canary, MD  cholecalciferol (VITAMIN D3) 25 MCG (1000 UNIT) tablet Take 2,000 Units by mouth daily. 06/20/21   [provider]  colchicine 0.6 MG tablet TAKE 2 TABLETS BY MOUTH ONCE THEN 1 TABLET BY MOUTH EVERY 2 HOURS AS NEEDED PAIN TIL PAIN GONE, MAXIMUM OF 6 TABLETS IN 24 HOURS OR INTOLERABLE DIARRHEA Patient not taking: Reported on 07/15/2023 04/08/22   Bradd Canary, MD  cyclobenzaprine (FLEXERIL) 10 MG tablet TAKE ONE-HALF TO ONE TABLET BY MOUTH TWICE  DAILY AS NEEDED FOR MUSCLE SPASM 09/24/22   Bradd Canary, MD  fentaNYL (DURAGESIC) 25 MCG/HR Place 1 patch onto the skin every 3 (three) days. 09/14/23   Bradd Canary, MD  lisinopril (ZESTRIL) 10 MG tablet TAKE ONE TABLET BY MOUTH ONE TIME DAILY 11/18/22   Bradd Canary, MD  metoprolol succinate (TOPROL-XL) 25 MG 24 hr tablet TAKE ONE TABLET BY MOUTH ONE TIME DAILY 09/24/23   Bradd Canary, MD  NONFORMULARY OR COMPOUNDED ITEM Semaglutide 5mg  and cyancobalamin 0.62ml Take 1ml once a week #80ml 09/18/23   Bradd Canary, MD  nystatin cream (MYCOSTATIN) Apply 1 Application topically 2 (two) times daily. 08/18/22   Bradd Canary, MD  Oxycodone HCl 20 MG TABS Take 1 tablet (20 mg total) by mouth 4 (four) times daily as needed. 10/16/23   Eulis Foster, FNP  traZODone (DESYREL) 100 MG tablet Take 1 tablet (100 mg total) by mouth at bedtime as needed for sleep. 04/16/23   Bradd Canary, MD      Allergies    Codeine, Cymbalta [duloxetine hcl], and Fiorinal [butalbital-aspirin-caffeine]    Review of Systems   Review of Systems  All other systems reviewed and are negative.   Physical Exam Updated Vital Signs BP (!) 162/87 (BP Location: Left Arm)   Pulse 74   Temp 97.7 F (36.5 C)   Resp 20   Ht 5\' 5"  (1.651 m)  Wt 98.9 kg   SpO2 97%   BMI 36.28 kg/m  Physical Exam Vitals and nursing note reviewed.  Constitutional:      General: She is not in acute distress.    Appearance: She is well-developed. She is not diaphoretic.  HENT:     Head: Normocephalic and atraumatic.  Cardiovascular:     Rate and Rhythm: Normal rate and regular rhythm.     Heart sounds: No murmur heard.    No friction rub. No gallop.  Pulmonary:     Effort: Pulmonary effort is normal. No respiratory distress.     Breath sounds: Normal breath sounds. No wheezing.  Abdominal:     General: Bowel sounds are normal. There is no distension.     Palpations: Abdomen is soft.     Tenderness: There is abdominal  tenderness in the epigastric area. There is no right CVA tenderness, left CVA tenderness, guarding or rebound.  Musculoskeletal:        General: Normal range of motion.     Cervical back: Normal range of motion and neck supple.  Skin:    General: Skin is warm and dry.  Neurological:     General: No focal deficit present.     Mental Status: She is alert and oriented to person, place, and time.     ED Results / Procedures / Treatments   Labs (all labs ordered are listed, but only abnormal results are displayed) Labs Reviewed  COMPREHENSIVE METABOLIC PANEL - Abnormal; Notable for the following components:      Result Value   Glucose, Bld 151 (*)    Calcium 11.2 (*)    Total Protein 8.2 (*)    All other components within normal limits  CBC - Abnormal; Notable for the following components:   WBC 14.6 (*)    RBC 5.41 (*)    Hemoglobin 16.4 (*)    HCT 48.5 (*)    All other components within normal limits  LIPASE, BLOOD  URINALYSIS, ROUTINE W REFLEX MICROSCOPIC    EKG None  Radiology No results found.  Procedures Procedures    Medications Ordered in ED Medications  sodium chloride 0.9 % bolus 1,000 mL (has no administration in time range)  ondansetron (ZOFRAN) injection 4 mg (has no administration in time range)  morphine (PF) 4 MG/ML injection 4 mg (has no administration in time range)    ED Course/ Medical Decision Making/ A&P  Patient is a 63 year old female presenting with abdominal pain as described in the HPI.  She arrives with stable vital signs and is afebrile.  On exam, she does have tenderness to the epigastric region, but no peritoneal signs.  Laboratory studies obtained including CBC, CMP, and lipase.  She has a leukocytosis with white count of 14.6, but laboratory studies are otherwise unremarkable.  CT scan of the abdomen and pelvis obtained showing findings that may represent a pancreatic student who is in suspected uterine fibroid, but nothing else  acute.  Patient has been hydrated with normal saline, given morphine for pain and Zofran for nausea and seems to be feeling somewhat better.  She feels comfortable with going home.  The cause of her pain and nausea unclear, but could be related to the pancreatic pseudocyst, or possibly a gastroenteritis.  Either way, I feel as though patient can safely be discharged.  She will be prescribed medication for pain and nausea and is to follow-up with her primary doctor later this week.  Final Clinical Impression(s) / ED  Diagnoses Final diagnoses:  None    Rx / DC Orders ED Discharge Orders     None         Geoffery Lyons, MD 11/10/23 [phone]

## 2023-11-10 NOTE — Telephone Encounter (Signed)
Pt was seen in ED last night, was told to see GI. No dx given last evening at the ED pt states s/s better today. Pt calling for a referral for GI.    Copied from CRM [id number]. Topic: Clinical - Red Word Triage >> Nov 10, 2023  2:44 PM [name] [name] wrote: Red Word that prompted transfer to Nurse Triage: Patient was seen in the ER Last night throwing up, chills, pain in stomach, running hot/cold (No fever), and fatigue. Reason for Disposition  [1] Caller requesting NON-URGENT health information AND [2] PCP's office is the best resource  Answer Assessment - Initial Assessment Questions 1. REASON FOR CALL or QUESTION: "What is your reason for calling today?" or "How can I best help you?" or "What question do you have that I can help answer?"     Need referral for GI per the ED instructions yesterday  Protocols used: Information Only Call - No Triage-A-AH

## 2023-11-10 NOTE — Telephone Encounter (Signed)
Copied from CRM [id number]. Topic: Clinical - Medication Refill >> Nov 10, 2023  3:58 PM Irine Seal wrote: Most Recent Primary Care Visit:  Provider: Clayborne Dana  Department: [facility]  Visit Type: PHYSICAL  Date: 08/24/2023  Medication: Oxycodone HCl 20 MG TABS   Has the patient contacted their pharmacy? Yes (Agent: If no, request that the patient contact the pharmacy for the refill. If patient does not wish to contact the pharmacy document the reason why and proceed with request.) (Agent: If yes, when and what did the pharmacy advise?) told her to contact PCP   Is this the correct pharmacy for this prescription? Yes If no, delete pharmacy and type the correct one.  This is the patient's preferred pharmacy:  [facility] [address] - [place], [place] - [address] [place] [address] Phone: [phone] Fax: [phone]  [facility] - [place], [place] [address] [phone] [address] [place] [address] Phone: [phone] Fax: [phone]   Has the prescription been filled recently? No  Is the patient out of the medication? Yes  Has the patient been seen for an appointment in the last year OR does the patient have an upcoming appointment? Yes  Can we respond through MyChart? Yes  Agent: Please be advised that Rx refills may take up to 3 business days. We ask that you follow-up with your pharmacy.

## 2023-11-10 NOTE — Discharge Instructions (Signed)
Begin taking Percocet as prescribed as needed for pain and Zofran as prescribed as needed for nausea.  Follow-up with your primary doctor in the next 1 to 2 weeks, and return to the ER if symptoms significantly worsen or change.  The radiologist who read your CAT scan is recommending an MRI of your abdomen to further evaluate the pseudocyst we discussed.  Please follow-up with your primary doctor to make these arrangements.

## 2023-11-10 NOTE — Telephone Encounter (Signed)
Copied from CRM [id number]. Topic: Referral - Request for Referral >> Nov 10, 2023  2:38 PM Denese Killings wrote: Did the patient discuss referral with their provider in the last year? No (If No - schedule appointment) (If Yes - send message)  Appointment offered? Yes  Type of order/referral and detailed reason for visit: Gastroenterology Patient was seen in the ER Last night throwing up (light brown in color), chills, pain in stomach, running hot/cold. (No fever)  Preference of office, provider, location: preferred location [address] [facility] Gastroenterology [phone]  If referral order, have you been seen by this specialty before? No (If Yes, this issue or another issue? When? Where?  Can we respond through MyChart? Yes

## 2023-11-11 ENCOUNTER — Other Ambulatory Visit: Payer: Self-pay | Admitting: Family Medicine

## 2023-11-11 ENCOUNTER — Encounter: Payer: Self-pay | Admitting: Family Medicine

## 2023-11-11 MED ORDER — OXYCODONE-ACETAMINOPHEN 5-325 MG PO TABS
2.0000 | ORAL_TABLET | ORAL | 0 refills | Status: DC | PRN
Start: 2023-11-11 — End: 2023-12-28

## 2023-11-11 NOTE — Telephone Encounter (Unsigned)
Copied from CRM [id number]. Topic: Clinical - Medication Refill >> Nov 11, 2023  4:02 PM Fuller Mandril wrote: Most Recent Primary Care Visit:  Provider: Clayborne Dana  Department: [facility]  Visit Type: PHYSICAL  Date: 08/24/2023  Medication: Oxycodone HCl 20 MG TAB - note shows ordered 2/11 but still shows 1/17 last fill on med tab   Has the patient contacted their pharmacy? No (Agent: If no, request that the patient contact the pharmacy for the refill. If patient does not wish to contact the pharmacy document the reason why and proceed with request.) (Agent: If yes, when and what did the pharmacy advise?)  Is this the correct pharmacy for this prescription? Yes If no, delete pharmacy and type the correct one.  This is the patient's preferred pharmacy:  [facility] [address] - [place], [place] - [address], [address] [place] [address] Phone: [phone] Fax: [phone]   Has the prescription been filled recently? 10/16/2023  Is the patient out of the medication? N/A   Has the patient been seen for an appointment in the last year OR does the patient have an upcoming appointment? Yes  Can we respond through MyChart? Yes  Agent: Please be advised that Rx refills may take up to 3 business days. We ask that you follow-up with your pharmacy.

## 2023-11-11 NOTE — Telephone Encounter (Signed)
Last Fill: 10/16/23 120 tabs/0 refills  Last OV: 08/24/23 Next OV: 02/25/24  Routing to provider for review/authorization.

## 2023-11-12 MED ORDER — OXYCODONE HCL 20 MG PO TABS
1.0000 | ORAL_TABLET | Freq: Four times a day (QID) | ORAL | 0 refills | Status: DC | PRN
Start: 2023-11-12 — End: 2023-12-05

## 2023-11-12 NOTE — Telephone Encounter (Signed)
Requesting: oxycodone 20mg  Contract: 03/10/23 UDS: 02/16/23 Last Visit: 08/24/23 Next Visit: 02/25/24 Last Refill: 10/16/23 #120 and 0RF   Please Advise

## 2023-11-15 ENCOUNTER — Other Ambulatory Visit: Payer: Self-pay | Admitting: Family Medicine

## 2023-11-17 ENCOUNTER — Encounter: Payer: Self-pay | Admitting: Family Medicine

## 2023-11-17 NOTE — Telephone Encounter (Signed)
Referral changed to Dr. Doristine Locks.

## 2023-12-03 ENCOUNTER — Encounter: Payer: Self-pay | Admitting: Family Medicine

## 2023-12-05 ENCOUNTER — Other Ambulatory Visit: Payer: Self-pay | Admitting: Family Medicine

## 2023-12-07 ENCOUNTER — Other Ambulatory Visit: Payer: Self-pay | Admitting: Family

## 2023-12-07 ENCOUNTER — Other Ambulatory Visit: Payer: Self-pay | Admitting: Family Medicine

## 2023-12-07 ENCOUNTER — Telehealth: Payer: Self-pay

## 2023-12-07 MED ORDER — SEMAGLUTIDE (1 MG/DOSE) 4 MG/3ML ~~LOC~~ SOPN: 1.0000 mg | PEN_INJECTOR | SUBCUTANEOUS | 1 refills | Status: DC

## 2023-12-07 MED ORDER — OXYCODONE HCL 20 MG PO TABS: 1.0000 | ORAL_TABLET | Freq: Four times a day (QID) | ORAL | 0 refills | Status: DC | PRN

## 2023-12-07 NOTE — Telephone Encounter (Signed)
 Copied from CRM [id number]. Topic: Clinical - Prescription Issue >> Dec 07, 2023 12:29 PM Sim Boast F wrote: Reason for CRM: Patient calling to clarify MyChart message that was sent 12/03/23. She says she has taken the Semaglutide regularly and she wants a different strength of 1/0.5

## 2023-12-07 NOTE — Telephone Encounter (Signed)
 Requesting: oxycodone 20mg  Contract: Yes UDS: 02/15/24 Last Visit: 02/16/2023 Next Visit: 02/25/2024 Last Refill: 11/12/23  Please Advise

## 2023-12-08 ENCOUNTER — Encounter (HOSPITAL_BASED_OUTPATIENT_CLINIC_OR_DEPARTMENT_OTHER): Payer: Self-pay | Admitting: Emergency Medicine

## 2023-12-08 ENCOUNTER — Other Ambulatory Visit: Payer: Self-pay

## 2023-12-08 ENCOUNTER — Emergency Department (HOSPITAL_BASED_OUTPATIENT_CLINIC_OR_DEPARTMENT_OTHER)
Admission: EM | Admit: 2023-12-08 | Discharge: 2023-12-09 | Disposition: A | Discharge status: Home / Self Care | Attending: Emergency Medicine | Admitting: Emergency Medicine

## 2023-12-08 DIAGNOSIS — R1013 Epigastric pain: Secondary | ICD-10-CM | POA: Diagnosis not present

## 2023-12-08 DIAGNOSIS — R1010 Upper abdominal pain, unspecified: Secondary | ICD-10-CM | POA: Diagnosis not present

## 2023-12-08 DIAGNOSIS — I10 Essential (primary) hypertension: Secondary | ICD-10-CM | POA: Diagnosis not present

## 2023-12-08 DIAGNOSIS — Z79899 Other long term (current) drug therapy: Secondary | ICD-10-CM | POA: Insufficient documentation

## 2023-12-08 DIAGNOSIS — E039 Hypothyroidism, unspecified: Secondary | ICD-10-CM | POA: Insufficient documentation

## 2023-12-08 DIAGNOSIS — R109 Unspecified abdominal pain: Secondary | ICD-10-CM | POA: Diagnosis not present

## 2023-12-08 DIAGNOSIS — R112 Nausea with vomiting, unspecified: Secondary | ICD-10-CM | POA: Diagnosis not present

## 2023-12-08 DIAGNOSIS — R630 Anorexia: Secondary | ICD-10-CM | POA: Insufficient documentation

## 2023-12-08 LAB — COMPREHENSIVE METABOLIC PANEL
ALT: 21 U/L (ref 0–44)
AST: 25 U/L (ref 15–41)
Albumin: 4.7 g/dL (ref 3.5–5.0)
Alkaline Phosphatase: 69 U/L (ref 38–126)
Anion gap: 17 — ABNORMAL HIGH (ref 5–15)
BUN: 12 mg/dL (ref 8–23)
CO2: 19 mmol/L — ABNORMAL LOW (ref 22–32)
Calcium: 11.2 mg/dL — ABNORMAL HIGH (ref 8.9–10.3)
Chloride: 100 mmol/L (ref 98–111)
Creatinine, Ser: 0.62 mg/dL (ref 0.44–1.00)
GFR, Estimated: >60 mL/min (ref >60)
Glucose, Bld: 151 mg/dL — ABNORMAL HIGH (ref 70–99)
Potassium: 4.3 mmol/L (ref 3.5–5.1)
Sodium: 136 mmol/L (ref 135–145)
Total Bilirubin: 1.6 mg/dL — ABNORMAL HIGH (ref 0.0–1.2)
Total Protein: 8.5 g/dL — ABNORMAL HIGH (ref 6.5–8.1)

## 2023-12-08 LAB — CBC
HCT: 50 % — ABNORMAL HIGH (ref 36.0–46.0)
Hemoglobin: 17.4 g/dL — ABNORMAL HIGH (ref 12.0–15.0)
MCH: 30.9 pg (ref 26.0–34.0)
MCHC: 34.8 g/dL (ref 30.0–36.0)
MCV: 88.7 fL (ref 80.0–100.0)
Platelets: 288 10*3/uL (ref 150–400)
RBC: 5.64 MIL/uL — ABNORMAL HIGH (ref 3.87–5.11)
RDW: 13.4 % (ref 11.5–15.5)
WBC: 12.5 10*3/uL — ABNORMAL HIGH (ref 4.0–10.5)
nRBC: 0 % (ref 0.0–0.2)

## 2023-12-08 LAB — URINALYSIS, ROUTINE W REFLEX MICROSCOPIC
Glucose, UA: NEGATIVE mg/dL
Hgb urine dipstick: NEGATIVE
Ketones, ur: >=80 mg/dL — AB
Leukocytes,Ua: NEGATIVE
Nitrite: NEGATIVE
Protein, ur: 100 mg/dL — AB
Specific Gravity, Urine: >=1.03 (ref 1.005–1.030)
pH: 5.5 (ref 5.0–8.0)

## 2023-12-08 LAB — URINALYSIS, MICROSCOPIC (REFLEX)

## 2023-12-08 LAB — LIPASE, BLOOD: Lipase: 38 U/L (ref 11–51)

## 2023-12-08 MED ORDER — ONDANSETRON HCL 4 MG/2ML IJ SOLN
4.0000 mg | Freq: Once | INTRAMUSCULAR | Status: AC
  Administered 2023-12-09: 4 mg via INTRAVENOUS
  Filled 2023-12-08: qty 2

## 2023-12-08 MED ORDER — KETOROLAC TROMETHAMINE 30 MG/ML IJ SOLN
30.0000 mg | Freq: Once | INTRAMUSCULAR | Status: AC
  Administered 2023-12-09: 30 mg via INTRAVENOUS
  Filled 2023-12-08: qty 1

## 2023-12-08 MED ORDER — SODIUM CHLORIDE 0.9 % IV BOLUS
1000.0000 mL | Freq: Once | INTRAVENOUS | Status: AC
  Administered 2023-12-09: 1000 mL via INTRAVENOUS

## 2023-12-08 MED ORDER — ONDANSETRON HCL 4 MG/2ML IJ SOLN
4.0000 mg | Freq: Once | INTRAMUSCULAR | Status: AC | PRN
Start: 2023-12-08 — End: 2023-12-08
  Administered 2023-12-08: 4 mg via INTRAVENOUS
  Filled 2023-12-08: qty 2

## 2023-12-08 NOTE — Telephone Encounter (Signed)
 So Ozempic comes in the following doses 0.25mg  / 0.5mg  pens - patient usually starts with 0.25mg  for 4 weeks, then increased to 0.5mg  thereafter 0.5mg  pens 1mg  pens 2mg  pens  Insurance will only allow 1 pen = 4 doses every 28 days - therefore if she got a lower strength like 0.5mg  and tried to inject 0.5mg  x 2 to equal 1mg  her pen would only last for 2 doses or 14 days.   I tried to call patient to discuss but no answer - LM on VM.

## 2023-12-08 NOTE — ED Triage Notes (Signed)
 Pt c/o vomiting since this morning with estimated 12 episodes of emesis. Pt was treated a few weeks ago for similar symptoms which did improve but have since returned. Pt is awaiting a GI appointment which has been scheduled for sometime in April. Pt reports upper abdominal pain. No diarrhea.

## 2023-12-08 NOTE — Telephone Encounter (Signed)
 Called to check on patient and see if she wanted to do a virtual visit with you before May. Patient said at this time she has been violently throwing up bile. She may have to go back to the ER if she isn't doing any better. She doesn't have an appointment with Dr. Gae Bon until April. She just wanted me to make you aware.

## 2023-12-08 NOTE — Telephone Encounter (Signed)
 Called and spoke with patient to get clarification on her message. She said in the beginning the original prescriber started her on a lower version (1 /0.5) which was helping her knee pain. When you took over, the version of the medication changed to (5 / 0.5) As far as the dosing goes, she moves up 0.25 every 3 weeks. She had been at the 1.0 dose for 5 weeks so she bumped herself up to 1.25. Now she is due for the 1.5 dose but wanted to know if she can get it in the (1 / 0.5) version so it can help her knee pain again

## 2023-12-08 NOTE — ED Provider Notes (Signed)
 [place] [facility] Provider Note   CSN: [id number] Arrival date & time: 12/08/23  2000     History  Chief Complaint  Patient presents with   Emesis    [name] [name] is a 63 y.o. female.  Patient is a 63 year old female with past medical history of hypothyroidism, hyperlipidemia, hypertension, myalgia, PTSD.  Patient presenting today for evaluation of abdominal pain, nausea, and vomiting.  This started this morning.  She reports multiple episodes of nonbloody emesis and an inability to tolerate p.o. intake.  She denies any diarrhea or bloody stool.  No fevers or chills.  Patient had a similar episode 1 month ago and was seen here.  She had a CT scan that was suggestive of a pancreatic pseudocyst, but showed no other acute findings.  She was referred to gastroenterology by her primary doctor, however this appointment is not scheduled until next month.  The history is provided by the patient.       Home Medications Prior to Admission medications   Medication Sig Start Date End Date Taking? Authorizing Provider  allopurinol (ZYLOPRIM) 100 MG tablet TAKE ONE TABLET BY MOUTH TWICE A DAY 09/04/23   Bradd Canary, MD  ALPRAZolam Prudy Feeler) 0.25 MG tablet Take 1 tablet (0.25 mg total) by mouth 2 (two) times daily as needed for anxiety. Patient not taking: Reported on 08/24/2023 03/04/21   Bradd Canary, MD  atorvastatin (LIPITOR) 40 MG tablet Take 1 tablet (40 mg total) by mouth daily. 02/17/23   Bradd Canary, MD  beclomethasone (BECONASE-AQ) 42 MCG/SPRAY nasal spray Place 1 spray into both nostrils 2 (two) times daily. Dose is for each nostril. 01/23/14   Bradd Canary, MD  cholecalciferol (VITAMIN D3) 25 MCG (1000 UNIT) tablet Take 2,000 Units by mouth daily. 06/20/21   [provider]  colchicine 0.6 MG tablet TAKE 2 TABLETS BY MOUTH ONCE THEN 1 TABLET BY MOUTH EVERY 2 HOURS AS NEEDED PAIN TIL PAIN GONE, MAXIMUM OF 6 TABLETS IN 24 HOURS OR  INTOLERABLE DIARRHEA Patient not taking: Reported on 07/15/2023 04/08/22   Bradd Canary, MD  cyclobenzaprine (FLEXERIL) 10 MG tablet TAKE ONE-HALF TO ONE TABLET BY MOUTH TWICE DAILY AS NEEDED FOR MUSCLE SPASM 09/24/22   Bradd Canary, MD  fentaNYL (DURAGESIC) 25 MCG/HR Place 1 patch onto the skin every 3 (three) days. 09/14/23   Bradd Canary, MD  lisinopril (ZESTRIL) 10 MG tablet TAKE ONE TABLET BY MOUTH ONE TIME DAILY 11/18/22   Bradd Canary, MD  metoprolol succinate (TOPROL-XL) 25 MG 24 hr tablet TAKE ONE TABLET BY MOUTH ONE TIME DAILY 09/24/23   Bradd Canary, MD  NONFORMULARY OR COMPOUNDED ITEM Semaglutide 5mg  and cyancobalamin 0.40ml Take 1ml once a week #82ml 09/18/23   Bradd Canary, MD  nystatin cream (MYCOSTATIN) Apply 1 Application topically 2 (two) times daily. 08/18/22   Bradd Canary, MD  ondansetron (ZOFRAN-ODT) 8 MG disintegrating tablet 8mg  ODT q4 hours prn nausea 11/10/23   Geoffery Lyons, MD  Oxycodone HCl 20 MG TABS Take 1 tablet (20 mg total) by mouth 4 (four) times daily as needed. 12/07/23   Bradd Canary, MD  oxyCODONE-acetaminophen (PERCOCET) 5-325 MG tablet Take 2 tablets by mouth every 4 (four) hours as needed. 11/11/23   Bradd Canary, MD  Semaglutide, 1 MG/DOSE, 4 MG/3ML SOPN Inject 1 mg as directed once a week. 12/07/23   Bradd Canary, MD  traZODone (DESYREL) 100 MG  tablet TAKE ONE TABLET BY MOUTH AT BEDTIME AS NEEDED FOR SLEEP 11/16/23   Bradd Canary, MD      Allergies    Codeine, Cymbalta [duloxetine hcl], and Fiorinal [butalbital-aspirin-caffeine]    Review of Systems   Review of Systems  All other systems reviewed and are negative.   Physical Exam Updated Vital Signs BP (!) 153/98 (BP Location: Left Arm)   Pulse 87   Temp (!) 96.8 F (36 C) (Temporal)   Resp 20   Ht 5\' 5"  (1.651 m)   Wt 98.9 kg   SpO2 98%   BMI 36.28 kg/m  Physical Exam Vitals and nursing note reviewed.  Constitutional:      General: She is not in acute  distress.    Appearance: She is well-developed. She is not diaphoretic.  HENT:     Head: Normocephalic and atraumatic.  Cardiovascular:     Rate and Rhythm: Normal rate and regular rhythm.     Heart sounds: No murmur heard.    No friction rub. No gallop.  Pulmonary:     Effort: Pulmonary effort is normal. No respiratory distress.     Breath sounds: Normal breath sounds. No wheezing.  Abdominal:     General: Bowel sounds are normal. There is no distension.     Palpations: Abdomen is soft.     Tenderness: There is abdominal tenderness. There is no guarding or rebound.     Comments: There is tenderness to palpation across the upper abdomen.  No rebound or guarding.  Musculoskeletal:        General: Normal range of motion.     Cervical back: Normal range of motion and neck supple.  Skin:    General: Skin is warm and dry.  Neurological:     General: No focal deficit present.     Mental Status: She is alert and oriented to person, place, and time.     ED Results / Procedures / Treatments   Labs (all labs ordered are listed, but only abnormal results are displayed) Labs Reviewed  COMPREHENSIVE METABOLIC PANEL - Abnormal; Notable for the following components:      Result Value   CO2 19 (*)    Glucose, Bld 151 (*)    Calcium 11.2 (*)    Total Protein 8.5 (*)    Total Bilirubin 1.6 (*)    Anion gap 17 (*)    All other components within normal limits  CBC - Abnormal; Notable for the following components:   WBC 12.5 (*)    RBC 5.64 (*)    Hemoglobin 17.4 (*)    HCT 50.0 (*)    All other components within normal limits  URINALYSIS, ROUTINE W REFLEX MICROSCOPIC - Abnormal; Notable for the following components:   APPearance CLOUDY (*)    Bilirubin Urine MODERATE (*)    Ketones, ur >=80 (*)    Protein, ur 100 (*)    All other components within normal limits  URINALYSIS, MICROSCOPIC (REFLEX) - Abnormal; Notable for the following components:   Bacteria, UA MANY (*)    All other  components within normal limits  LIPASE, BLOOD    EKG None  Radiology No results found.  Procedures Procedures  {Document cardiac monitor, telemetry assessment procedure when appropriate:1}  Medications Ordered in ED Medications  sodium chloride 0.9 % bolus 1,000 mL (has no administration in time range)  ketorolac (TORADOL) 30 MG/ML injection 30 mg (has no administration in time range)  ondansetron (ZOFRAN) injection 4  mg (has no administration in time range)  ondansetron (ZOFRAN) injection 4 mg (4 mg Intravenous Given 12/08/23 2032)    ED Course/ Medical Decision Making/ A&P   {   Click here for ABCD2, HEART and other calculatorsREFRESH Note before signing :1}                              Medical Decision Making Amount and/or Complexity of Data Reviewed Labs: ordered. Radiology: ordered.  Risk Prescription drug management.   ***  {Document critical care time when appropriate:1} {Document review of labs and clinical decision tools ie heart score, Chads2Vasc2 etc:1}  {Document your independent review of radiology images, and any outside records:1} {Document your discussion with family members, caretakers, and with consultants:1} {Document social determinants of health affecting pt's care:1} {Document your decision making why or why not admission, treatments were needed:1} Final Clinical Impression(s) / ED Diagnoses Final diagnoses:  None    Rx / DC Orders ED Discharge Orders     None

## 2023-12-09 ENCOUNTER — Other Ambulatory Visit (HOSPITAL_COMMUNITY): Payer: Self-pay

## 2023-12-09 ENCOUNTER — Other Ambulatory Visit: Payer: Self-pay | Admitting: Family Medicine

## 2023-12-09 ENCOUNTER — Telehealth: Payer: Self-pay | Admitting: Gastroenterology

## 2023-12-09 ENCOUNTER — Emergency Department (HOSPITAL_BASED_OUTPATIENT_CLINIC_OR_DEPARTMENT_OTHER)

## 2023-12-09 DIAGNOSIS — R109 Unspecified abdominal pain: Secondary | ICD-10-CM | POA: Diagnosis not present

## 2023-12-09 MED ORDER — ONDANSETRON HCL 4 MG/2ML IJ SOLN
4.0000 mg | Freq: Once | INTRAMUSCULAR | Status: AC
  Administered 2023-12-09: 4 mg via INTRAVENOUS
  Filled 2023-12-09: qty 2

## 2023-12-09 MED ORDER — SEMAGLUTIDE-WEIGHT MANAGEMENT 1.7 MG/0.75ML ~~LOC~~ SOAJ: 1.7000 mg | SUBCUTANEOUS | 1 refills | Status: DC

## 2023-12-09 MED ORDER — PROMETHAZINE HCL 25 MG RE SUPP: 25.0000 mg | Freq: Four times a day (QID) | RECTAL | 0 refills | Status: DC | PRN

## 2023-12-09 MED ORDER — IOHEXOL 300 MG/ML  SOLN
100.0000 mL | Freq: Once | INTRAMUSCULAR | Status: AC | PRN
  Administered 2023-12-09: 100 mL via INTRAVENOUS

## 2023-12-09 MED ORDER — MORPHINE SULFATE (PF) 4 MG/ML IV SOLN
4.0000 mg | Freq: Once | INTRAVENOUS | Status: AC
  Administered 2023-12-09: 4 mg via INTRAVENOUS
  Filled 2023-12-09: qty 1

## 2023-12-09 NOTE — ED Notes (Signed)
 Discharge instructions reviewed.   Newly prescribed medications discussed. Pharmacy verified.   Opportunity for questions and concerns provided.   Alert, oriented and ambulatory. Displays no signs of distress.

## 2023-12-09 NOTE — Telephone Encounter (Signed)
 Inbound call from Gavin Pound stating that patient has an appointment on 5/19 at 2:00 with Dr. Barron Alvine. Gavin Pound states that patient has been to the ER twice in a month for abdominal pain, nausea and " violent" vomiting. She states patient is not doing well and is needing to be seen sooner than later. Requesting a call to discuss. Please advise.

## 2023-12-09 NOTE — Telephone Encounter (Signed)
 Called and spoke with patient and Gavin Pound. Appt has been rescheduled to tomorrow at 1:30 pm with Dr. Barron Alvine.

## 2023-12-09 NOTE — Discharge Instructions (Signed)
 Begin taking Phenergan as prescribed as needed for nausea.  Follow-up with gastroenterology as scheduled.  Return to the ER if symptoms significantly worsen or change.

## 2023-12-10 ENCOUNTER — Ambulatory Visit (INDEPENDENT_AMBULATORY_CARE_PROVIDER_SITE_OTHER): Admitting: Gastroenterology

## 2023-12-10 ENCOUNTER — Encounter: Payer: Self-pay | Admitting: Gastroenterology

## 2023-12-10 VITALS — BP 130/62 | HR 70 | Ht 65.0 in | Wt 217.0 lb

## 2023-12-10 DIAGNOSIS — K862 Cyst of pancreas: Secondary | ICD-10-CM

## 2023-12-10 DIAGNOSIS — Z860100 Personal history of colon polyps, unspecified: Secondary | ICD-10-CM

## 2023-12-10 DIAGNOSIS — R1013 Epigastric pain: Secondary | ICD-10-CM | POA: Diagnosis not present

## 2023-12-10 DIAGNOSIS — R112 Nausea with vomiting, unspecified: Secondary | ICD-10-CM | POA: Diagnosis not present

## 2023-12-10 DIAGNOSIS — Z8719 Personal history of other diseases of the digestive system: Secondary | ICD-10-CM | POA: Diagnosis not present

## 2023-12-10 NOTE — Progress Notes (Signed)
 Chief Complaint: Nausea/vomiting, abdominal pain, ER follow-up   GI History: 63 year old female with a history of HTN, HLD, hypothyroidism, anxiety, depression, PTSD, chronic back pain (on fentanyl, Percocet) colon polyps, follows in GI clinic for the following:   Hospital admission at [facility] in 06/2020 for acute pancreatitis and also noted anemia.  Patient declined endoscopic evaluation.  She does have a family history of colon cancer (mother, maternal aunt, maternal grandfather all diagnosed above age 74).   Endoscopic Hx: - 04/2012: Colonoscopy: Diminutive left colon polyp, sigmoid diverticulosis, grade 1 internal hemorrhoids - 11/2019: Colonoscopy: 5 subcentimeter adenomas and recommended repeat in 3 years  - 07/2023: Colonoscopy: 3 subcentimeter adenomas in the ascending colon/cecum, 3 mm sigmoid hyperplastic polyp.  Normal TI.  Repeat in 3 years  HPI:     [name] [name] is a 63 y.o. female referred to the Gastroenterology Clinic for evaluation of nausea/vomiting and abdominal pain.  She describes having acute onset nausea/vomiting and epigastric pain in February.  No prior similar symptoms.  No associated changes in bowel habits, hematochezia, melena, hematemesis, fever.  She was seen in the ER on 11/09/2023 for evaluation of the symptoms. - WBC 14.6, H/H 16.4/48.5 - Normal PLT, lipase - Calcium 11.2 (chronically elevated), protein 8.2, otherwise normal CMP - CT A/P: 2.4 x 1.6 x 1.0 cm area of low-attenuation with thin surrounding calcified wall in the ventral portion of the pancreatic body.  No peripancreatic inflammation or PD dilation.  Otherwise normal GI tract.  Recommend nonemergent MRI.  2 cm uterine fibroid - Treated with Zofran, morphine, IV fluids with improvement and discharged home  She reports symptoms resolved and returned to her baseline state of health.  Symptoms with acute recurrence earlier this week.  She tried treating with Zofran and  some leftover Phenergan suppositories at home without relief, so she presented to the ER again on 12/08/2023: - WBC 12.5, H/H 17.4/50 - Normal PLT, lipase - Calcium 11.2, protein 8.5, CO2 19 - T. bili 1.6, otherwise normal AST/ALT/ALP - UA with moderate bilirubin, elevated ketones and protein, many bacteria - CT A/P: Similar appearing 2.6 x 1.7 cm fluid density in the pancreatic tail and 1.4 cm pancreatic body partially calcified lesion.  No surrounding peripancreatic inflammation.  Normal GI tract.  Recommend MRI pancreas protocol.  Questionable uterine fibroids - Treated with Zofran, Toradol, IV fluids, and discharged home  Today, she states symptoms have again resolved.  Presents to the office with her sister, whom she lives with and has been otherwise without any symptoms.  She did start semaglutide in November for weight loss.  No other notable changes in medications, trial, etc.  Past Medical History:  Diagnosis Date   Acute foot pain, right 01/13/2017   Acute upper respiratory infections of unspecified site 06/12/2014   Anxiety and depression    chronic   Breast cancer screening 09/05/2016   Cervical cancer screening 09/05/2016   Chronic back pain    Depression with anxiety 08/23/2008   Qualifier: Diagnosis of  By: Alphonzo Severance MD, Loni Dolly    Gout 05/05/2017   History of colon polyps    History of fibromyalgia    suspected    Hypertension    Insomnia    Knee pain, right 06/24/2015   Meningitis spinal    Morbid obesity (HCC)    Ovarian cyst, bilateral    Preventative health care 06/24/2015   PTSD (post-traumatic stress  disorder)    Vitamin D deficiency 06/24/2015     Past Surgical History:  Procedure Laterality Date   COLONOSCOPY     OVARIAN CYST REMOVAL     Family History  Problem Relation Age of Onset   Colon cancer Mother    Breast cancer Mother        benign   Rheum arthritis Father    Diabetes Father    Congestive Heart Failure Father    Hypertension Father     Stroke Father    Hip fracture Maternal Grandmother    COPD Maternal Grandfather 56   Colon cancer Maternal Grandfather    Lymphoma Paternal Grandmother    Colon cancer Maternal Aunt    Colon cancer Maternal Uncle 14   Breast cancer Cousin        negative stage 3   Breast cancer Maternal Great-grandmother    ADD / ADHD Neg Hx    Depression Neg Hx    Alcohol abuse Neg Hx    Drug abuse Neg Hx    Esophageal cancer Neg Hx    Social History   Tobacco Use   Smoking status: Former    Current packs/day: 0.00    Types: Cigarettes    Quit date: 04/04/1989    Years since quitting: 34.7   Smokeless tobacco: Never  Vaping Use   Vaping status: Never Used  Substance Use Topics   Alcohol use: Yes    Comment: occassionally- 2 glassses of wine 2x/month   Drug use: No    Comment: take meds as prescribed   Current Outpatient Medications  Medication Sig Dispense Refill   cyclobenzaprine (FLEXERIL) 10 MG tablet TAKE ONE-HALF TO ONE TABLET BY MOUTH TWICE DAILY AS NEEDED FOR MUSCLE SPASM 40 tablet 2   fentaNYL (DURAGESIC) 25 MCG/HR Place 1 patch onto the skin every 3 (three) days. 10 patch 0   lisinopril (ZESTRIL) 10 MG tablet TAKE ONE TABLET BY MOUTH ONE TIME DAILY 90 tablet 1   metoprolol succinate (TOPROL-XL) 25 MG 24 hr tablet TAKE ONE TABLET BY MOUTH ONE TIME DAILY 90 tablet 1   NONFORMULARY OR COMPOUNDED ITEM Semaglutide 5mg  and cyancobalamin 0.31ml Take 1ml once a week #37ml 1 each 0   nystatin cream (MYCOSTATIN) Apply 1 Application topically 2 (two) times daily. 30 g 02   ondansetron (ZOFRAN-ODT) 8 MG disintegrating tablet 8mg  ODT q4 hours prn nausea 10 tablet 0   Oxycodone HCl 20 MG TABS Take 1 tablet (20 mg total) by mouth 4 (four) times daily as needed. 120 tablet 0   promethazine (PHENERGAN) 25 MG suppository Place 1 suppository (25 mg total) rectally every 6 (six) hours as needed for nausea or vomiting. 12 each 0   Semaglutide, 1 MG/DOSE, 4 MG/3ML SOPN Inject 1 mg as directed once a  week. 3 mL 1   Semaglutide-Weight Management 1.7 MG/0.75ML SOAJ Inject 1.7 mg into the skin once a week. 3 mL 1   traZODone (DESYREL) 100 MG tablet TAKE ONE TABLET BY MOUTH AT BEDTIME AS NEEDED FOR SLEEP 90 tablet 1   allopurinol (ZYLOPRIM) 100 MG tablet TAKE ONE TABLET BY MOUTH TWICE A DAY (Patient not taking: Reported on 12/10/2023) 30 tablet 4   ALPRAZolam (XANAX) 0.25 MG tablet Take 1 tablet (0.25 mg total) by mouth 2 (two) times daily as needed for anxiety. (Patient not taking: Reported on 12/10/2023) 5 tablet 1   atorvastatin (LIPITOR) 40 MG tablet Take 1 tablet (40 mg total) by mouth daily. (Patient not taking:  Reported on 12/10/2023) 90 tablet 3   beclomethasone (BECONASE-AQ) 42 MCG/SPRAY nasal spray Place 1 spray into both nostrils 2 (two) times daily. Dose is for each nostril. (Patient not taking: Reported on 12/10/2023) 25 g 1   cholecalciferol (VITAMIN D3) 25 MCG (1000 UNIT) tablet Take 2,000 Units by mouth daily.     colchicine 0.6 MG tablet TAKE 2 TABLETS BY MOUTH ONCE THEN 1 TABLET BY MOUTH EVERY 2 HOURS AS NEEDED PAIN TIL PAIN GONE, MAXIMUM OF 6 TABLETS IN 24 HOURS OR INTOLERABLE DIARRHEA (Patient not taking: Reported on 12/10/2023) 6 tablet 2   oxyCODONE-acetaminophen (PERCOCET) 5-325 MG tablet Take 2 tablets by mouth every 4 (four) hours as needed. (Patient not taking: Reported on 12/10/2023) 20 tablet 0   No current facility-administered medications for this visit.   Allergies  Allergen Reactions   Codeine Nausea And Vomiting   Cymbalta [Duloxetine Hcl] Other (See Comments)    Headaches   Fiorinal [Butalbital-Aspirin-Caffeine] Other (See Comments)    Mental Clarity.     Review of Systems: All systems reviewed and negative except where noted in HPI.     Physical Exam:    Wt Readings from Last 3 Encounters:  12/10/23 217 lb (98.4 kg)  12/08/23 218 lb (98.9 kg)  11/09/23 218 lb (98.9 kg)    BP 130/62   Pulse 70   Ht 5\' 5"  (1.651 m)   Wt 217 lb (98.4 kg)   BMI 36.11  kg/m  Constitutional:  Pleasant, in no acute distress. Psychiatric: Normal mood and affect. Behavior is normal. Cardiovascular: Normal rate, regular rhythm. No edema Pulmonary/chest: Effort normal and breath sounds normal. No wheezing, rales or rhonchi. Abdominal: Mild TTP in epigastrium without rebound, guarding, peritoneal signs.  Soft, nondistended. Bowel sounds active throughout. There are no masses palpable. No hepatomegaly. Neurological: Alert and oriented to person place and time. Skin: Skin is warm and dry. No rashes noted.   ASSESSMENT AND PLAN;   1) Pancreatic cystic lesion 2) History of acute pancreatitis Hospital admission at [facility] in 06/2020 for acute pancreatitis.  No prior or subsequent episodes of acute pancreatitis per patient, but now with CT x 2 showing 2.6 x 1.7 cm pancreatic cyst.  Discussed DDx for cystic pancreatic lesions with patient and her sister today, to include possibility of pancreatic pseudocyst, IPMN, etc. with plan for the following: - MRI pancreas protocol now - Depending on MRI results, low threshold for EUS, particular given history of acute pancreatitis without clear etiology in 2021 - While semaglutide can be a potential cause of acute pancreatitis, CT x 2 and lipase x 2 without e/o acute pancreatitis  3) Epigastric pain 4) Nausea/Vomiting - Planning on MRI pancreas as above - If MRI Pancreaas unrevealing and ongoing/recurrent sxs, plan for EGD to evaluate for medical/luminal pathology, PUD, gastritis, GOO, etc. - Ok to continue Zofran ODT and phenergan supp prn - Unclear if epigastric pain and nausea/vomiting is even related to the pancreatic cyst noted on CT.  Additional DDx includes viral gastroenteritis, medication ADR (semaglutide started a couple months prior), gastritis, PUD, chronic pain medications, etc.  5) History of colon polyps - Repeat colonoscopy in 07/2026 for ongoing polyp surveillance    Shellia Cleverly, DO, FACG   12/10/2023, 1:36 PM   Bradd Canary, MD

## 2023-12-10 NOTE — Patient Instructions (Addendum)
 _______________________________________________________  If your blood pressure at your visit was 140/90 or greater, please contact your primary care physician to follow up on this.  If you are age 62 or younger, your body mass index should be between 19-25. Your Body mass index is 36.11 kg/m. If this is out of the aformentioned range listed, please consider follow up with your Primary Care Provider.  ________________________________________________________  The [place] [facility] providers would like to encourage you to use [facility] to communicate with providers for non-urgent requests or questions.  Due to long hold times on the telephone, sending your provider a message by [facility] may be a faster and more efficient way to get a response.  Please allow 48 business hours for a response.  Please remember that this is for non-urgent requests.  _______________________________________________________  Bonita Quin have been scheduled for an MRI at [facility] on 12-16-23. Your appointment time is 830am. Please arrive to admitting (at main entrance of the hospital) 30 minutes prior to your appointment time for registration purposes. Please make certain not to have anything to eat or drink after midnight the night prior to your test. In addition, if you have any metal in your body, have a pacemaker or defibrillator, please be sure to let your ordering physician know. This test typically takes 45 minutes to 1 hour to complete. Should you need to reschedule, please call [phone] to do so.  Due to recent changes in healthcare laws, you may see the results of your imaging and laboratory studies on MyChart before your provider has had a chance to review them.  We understand that in some cases there may be results that are confusing or concerning to you. Not all laboratory results come back in the same time frame and the provider may be waiting for multiple results in order to interpret others.  Please give [place] 48 hours  in order for your provider to thoroughly review all the results before contacting the office for clarification of your results.   It was a pleasure to see you today!  Vito Cirigliano, D.O.

## 2023-12-10 NOTE — Telephone Encounter (Signed)
 Called and checked on patient. Instructions given per provider's note. Patient said she has an appointment with GI today (Dr. Barron Alvine). She said she will keep you posted.

## 2023-12-11 ENCOUNTER — Other Ambulatory Visit: Payer: Self-pay | Admitting: Family Medicine

## 2023-12-11 NOTE — Telephone Encounter (Signed)
 Mychart message sent to patient.

## 2023-12-14 ENCOUNTER — Telehealth: Payer: Self-pay

## 2023-12-14 ENCOUNTER — Other Ambulatory Visit (HOSPITAL_COMMUNITY): Payer: Self-pay

## 2023-12-14 NOTE — Telephone Encounter (Signed)
 Pharmacy Patient Advocate Encounter   Received notification from Onbase that prior authorization for Oxycodone 20 mg is required/requested.   Insurance verification completed.   The patient is insured through [employer] .   Per test claim: The current 30 day co-pay is, $4.00.  No PA needed at this time. This test claim was processed through [facility]- copay amounts may vary at other pharmacies due to pharmacy/plan contracts, or as the patient moves through the different stages of their insurance plan.

## 2023-12-16 ENCOUNTER — Other Ambulatory Visit (HOSPITAL_COMMUNITY)

## 2023-12-17 ENCOUNTER — Other Ambulatory Visit: Payer: Self-pay | Admitting: Gastroenterology

## 2023-12-17 ENCOUNTER — Ambulatory Visit (HOSPITAL_COMMUNITY)
Admission: RE | Admit: 2023-12-17 | Discharge: 2023-12-17 | Disposition: A | Discharge status: Home / Self Care | Source: Ambulatory Visit | Attending: Gastroenterology | Admitting: Gastroenterology

## 2023-12-17 ENCOUNTER — Telehealth: Payer: Self-pay | Admitting: Gastroenterology

## 2023-12-17 DIAGNOSIS — R1013 Epigastric pain: Secondary | ICD-10-CM | POA: Diagnosis not present

## 2023-12-17 DIAGNOSIS — R112 Nausea with vomiting, unspecified: Secondary | ICD-10-CM | POA: Insufficient documentation

## 2023-12-17 DIAGNOSIS — M47816 Spondylosis without myelopathy or radiculopathy, lumbar region: Secondary | ICD-10-CM | POA: Diagnosis not present

## 2023-12-17 DIAGNOSIS — K8020 Calculus of gallbladder without cholecystitis without obstruction: Secondary | ICD-10-CM | POA: Diagnosis not present

## 2023-12-17 DIAGNOSIS — K862 Cyst of pancreas: Secondary | ICD-10-CM | POA: Diagnosis not present

## 2023-12-17 MED ORDER — GADOBUTROL 1 MMOL/ML IV SOLN
10.0000 mL | Freq: Once | INTRAVENOUS | Status: AC | PRN
  Administered 2023-12-17: 10 mL via INTRAVENOUS

## 2023-12-17 NOTE — Telephone Encounter (Signed)
 Patient called and stated that she would like to know if her MRI that was order for her to have it done at the hospital today was covered by her insurance. Patient is requesting a call back. Please advise.

## 2023-12-21 ENCOUNTER — Encounter: Payer: Self-pay | Admitting: Family Medicine

## 2023-12-24 ENCOUNTER — Telehealth: Payer: Self-pay

## 2023-12-24 NOTE — Telephone Encounter (Signed)
 Copied from CRM [id number]. Topic: General - Other >> Dec 24, 2023  2:10 PM Jon Gills C wrote: Reason for CRM: Patient called in stated she sent a mychart message regarding her prescriptions, would like for Dr.Blyth or her nurse to get in touch with her about it rather its through phone or mychart

## 2023-12-25 NOTE — Telephone Encounter (Signed)
 Called and spoke with patient. An appointment was made to speak in person with provider to discuss medication issues she's having

## 2023-12-27 NOTE — Assessment & Plan Note (Signed)
 She has been taking a compounded version of a GLP1 from a company called [employer] and has requested we prescribe it but she will have to see someone who specializes in compounded meds.

## 2023-12-27 NOTE — Assessment & Plan Note (Signed)
 On Levothyroxine, continue to monitor

## 2023-12-27 NOTE — Assessment & Plan Note (Signed)
 hgba1c acceptable, minimize simple carbs. Increase exercise as tolerated.

## 2023-12-27 NOTE — Assessment & Plan Note (Signed)
 Well controlled, no changes to meds. Encouraged heart healthy diet such as the DASH diet and exercise as tolerated.

## 2023-12-27 NOTE — Assessment & Plan Note (Signed)
 Encourage heart healthy diet such as MIND or DASH diet, increase exercise, avoid trans fats, simple carbohydrates and processed foods, consider a krill or fish or flaxseed oil cap daily. Tolerating Atorvastatin

## 2023-12-27 NOTE — Assessment & Plan Note (Signed)
 Supplement and monitor

## 2023-12-28 ENCOUNTER — Encounter: Payer: Self-pay | Admitting: Family Medicine

## 2023-12-28 ENCOUNTER — Ambulatory Visit: Admitting: Family Medicine

## 2023-12-28 VITALS — BP 112/64 | HR 79 | Temp 98.4°F | Resp 18 | Ht 66.0 in | Wt 220.6 lb

## 2023-12-28 DIAGNOSIS — E538 Deficiency of other specified B group vitamins: Secondary | ICD-10-CM

## 2023-12-28 DIAGNOSIS — E785 Hyperlipidemia, unspecified: Secondary | ICD-10-CM | POA: Diagnosis not present

## 2023-12-28 DIAGNOSIS — I10 Essential (primary) hypertension: Secondary | ICD-10-CM

## 2023-12-28 DIAGNOSIS — E039 Hypothyroidism, unspecified: Secondary | ICD-10-CM

## 2023-12-28 DIAGNOSIS — M791 Myalgia, unspecified site: Secondary | ICD-10-CM

## 2023-12-28 DIAGNOSIS — E010 Iodine-deficiency related diffuse (endemic) goiter: Secondary | ICD-10-CM

## 2023-12-28 DIAGNOSIS — E559 Vitamin D deficiency, unspecified: Secondary | ICD-10-CM

## 2023-12-28 DIAGNOSIS — E669 Obesity, unspecified: Secondary | ICD-10-CM

## 2023-12-28 DIAGNOSIS — E213 Hyperparathyroidism, unspecified: Secondary | ICD-10-CM | POA: Diagnosis not present

## 2023-12-28 DIAGNOSIS — M1A9XX0 Chronic gout, unspecified, without tophus (tophi): Secondary | ICD-10-CM

## 2023-12-28 DIAGNOSIS — R739 Hyperglycemia, unspecified: Secondary | ICD-10-CM | POA: Diagnosis not present

## 2023-12-28 NOTE — Patient Instructions (Signed)
Goiter  A goiter is an enlarged thyroid gland. The thyroid gland is located in the lower front part of the neck, just in front of the windpipe (trachea). This gland makes hormones that affect how the body processes food for energy (metabolism) and how the heart and brain function. Most goiters are painless and are not a cause for concern. Some goiters can affect the way your thyroid makes thyroid hormones. Goiters and conditions that cause goiters can be treated, if necessary. What are the causes? This condition may be caused by: Lack of a mineral called iodine. The thyroid gland uses iodine to make thyroid hormones. Diseases that attack healthy cells in the body (autoimmune diseases) and affect thyroid function, such as Graves' disease or Hashimoto's disease. These diseases may cause the body to produce too much thyroid hormone (hyperthyroidism) or too little of the hormone (hypothyroidism). Conditions that cause inflammation of the thyroid (thyroiditis). One or more small growths on the thyroid (nodular goiter). Other causes may include: Medical problems caused by abnormal genes that are passed from parent to child (genetic defects). Thyroid injury or infection. Tumors that may or may not be cancerous. Pregnancy. Certain medicines. Exposure to radiation. In some cases, the cause may not be known. What increases the risk? The following factors may make you more likely to develop this condition: You do not get enough iodine in your diet. You have a family history of goiter. You are female. You are older than age 40. You smoke tobacco. You have had exposure to radiation. What are the signs or symptoms? The main symptom of this condition is swelling in the lower, front part of the neck. This swelling can range from a very small bump to a large lump. Other symptoms may include: A tight feeling in the throat. A hoarse voice. Coughing. Wheezing. Difficulty swallowing or breathing. Bulging  veins in the neck. Dizziness. When a goiter is the result of an overactive thyroid (hyperthyroidism), symptoms may also include: Nervousness or restlessness. Inability to tolerate heat. Unexplained weight loss. Diarrhea. Changes in heartbeat, such as skipped beats, extra beats, or a rapid heart rate. Loss of menstruation. Increased appetite. Sleep problems. When a goiter is the result of an underactive thyroid (hypothyroidism), symptoms may also include: Feeling tired (fatigue). Inability to tolerate cold. Weight gain that is not explained by a change in diet or exercise habits. Dry skin or coarse hair. Irregular menstrual periods. Constipation. Sadness or depression. In some cases, there may not be any symptoms. How is this diagnosed? This condition may be diagnosed based on your symptoms, your medical history, and a physical exam. You may have tests, such as: Blood tests to check thyroid function. Imaging tests, such as: Ultrasound. CT scan. MRI. Thyroid scan. Removal of a tissue sample (biopsy) of the goiter or any nodules. The sample will be tested to check for cancer. How is this treated? Treatment for this condition depends on the cause and your symptoms. Treatment may include: Medicines to regulate thyroid hormone levels. Anti-inflammatory medicines or steroid medicines, if the goiter is caused by inflammation. Iodine supplements or changes to your diet, if the goiter is caused by iodine deficiency. Radioactive iodine treatment. Surgery to remove your thyroid. In some cases, you may only need regular check-ups with your health care provider to monitor your condition, and you may not need treatment. Follow these instructions at home: Follow instructions from your health care provider about any changes to your diet. Take over-the-counter and prescription medicines only as told   by your health care provider. These include supplements. Do not use any products that contain  nicotine or tobacco. These products include cigarettes, chewing tobacco, and vaping devices, such as e-cigarettes. If you need help quitting, ask your health care provider. Keep all follow-up visits. Your health care provider will want to repeat blood tests to check thyroid function. Where to find more information American Thyroid Association: thyroid.org Endocrine Society: endocrine.org Contact a health care provider if: Your symptoms do not get better with treatment. You have nausea, vomiting, or diarrhea. You have a fever. You suddenly become very weak. You experience extreme restlessness. Get help right away if: You have sudden, unexplained confusion or other mental changes. You have chest pain. You have trouble breathing or swallowing. You have fast or irregular heartbeats (palpitations). These symptoms may be an emergency. Get help right away. Call 911. Do not wait to see if the symptoms will go away. Do not drive yourself to the hospital. Summary A goiter is an enlarged thyroid gland. The thyroid gland is located in the lower front part of the neck, just in front of the windpipe. The main symptom of this condition is swelling in the lower, front part of the neck. This swelling can range from a very small bump to a large lump. Treatment for this condition depends on the cause and your symptoms. You may need medicines, supplements, or regular monitoring of your condition. This information is not intended to replace advice given to you by your health care provider. Make sure you discuss any questions you have with your health care provider. Document Revised: 11/08/2021 Document Reviewed: 11/08/2021 Elsevier Patient Education  2024 [employer].

## 2023-12-28 NOTE — Telephone Encounter (Signed)
 Patient has appointment with provider today. Will discuss this issue at that time.

## 2023-12-28 NOTE — Progress Notes (Signed)
 Subjective:    Patient ID: [name] [name], female    DOB: [birth year], 63 y.o.   MRN: [id number]  Chief Complaint  Patient presents with   Medication Problem    HPI Discussed the use of AI scribe software for clinical note transcription with the patient, who gave verbal consent to proceed.  History of Present Illness [name] HEATON "Candace" is a 63 year old female who presents with concerns regarding compounded semaglutide and vitamin B12 injections. She has been getting them from a company called [employer] and would like [place] to prescribe them. They come in vials and it has to be drawn up Semglutide/Cyanobalamin injection in 2.5 ml vials in 1/0.5 mg/ml or  5/0.5 mg/ml strengths Or in 1 ml vial in 1/0.5 mg/ml or 5/0.5 mg/ml strengths  She is experiencing confusion regarding the dosing and differences between the compounded semaglutide products she received. Initially, she preferred one formulation but received another in the mail without realizing the difference, leading to concerns about potential overdose. She has been using semaglutide for weight management and notes a significant reduction in appetite, stating 'I can barely eat as it is.'  She has been taking vitamin B12 injections and also uses sublingual B12 supplements. She is concerned about the potential for excessive B12 intake, especially since she has been taking it alongside the compounded injections.  She mentions a history of elevated calcium levels and a previous MRI, the results of which she has not yet received. She also reports a history of elevated white blood cell count, which has decreased over time.  She has stopped taking atorvastatin and allopurinol, noting no recent issues with gout. She is not currently taking atorvastatin for cholesterol management.  She has a family history of heart disease and cholesterol issues, with her father having had heart disease and dying of a stroke at age 15. He also had rheumatoid  arthritis and diabetes. No history of heart disease in her mother.  No recent issues with gout. No trouble with swallowing, swelling, or pain in the neck.    Past Medical History:  Diagnosis Date   Acute foot pain, right 01/13/2017   Acute upper respiratory infections of unspecified site 06/12/2014   Anxiety and depression    chronic   Breast cancer screening 09/05/2016   Cervical cancer screening 09/05/2016   Chronic back pain    Depression with anxiety 08/23/2008   Qualifier: Diagnosis of  By: Alphonzo Severance MD, Loni Dolly    Gout 05/05/2017   History of colon polyps    History of fibromyalgia    suspected    Hypertension    Insomnia    Knee pain, right 06/24/2015   Meningitis spinal    Morbid obesity (HCC)    Ovarian cyst, bilateral    Preventative health care 06/24/2015   PTSD (post-traumatic stress disorder)    Vitamin D deficiency 06/24/2015    Past Surgical History:  Procedure Laterality Date   COLONOSCOPY     OVARIAN CYST REMOVAL      Family History  Problem Relation Age of Onset   Colon cancer Mother    Breast cancer Mother        benign   Rheum arthritis Father    Diabetes Father    Congestive Heart Failure Father    Hypertension Father    Stroke Father    Hip fracture Maternal Grandmother    COPD Maternal Grandfather 79   Colon cancer Maternal Grandfather    Lymphoma Paternal  Grandmother    Colon cancer Maternal Aunt    Colon cancer Maternal Uncle 17   Breast cancer Cousin        negative stage 3   Breast cancer Maternal Great-grandmother    ADD / ADHD Neg Hx    Depression Neg Hx    Alcohol abuse Neg Hx    Drug abuse Neg Hx    Esophageal cancer Neg Hx     Social History   Socioeconomic History   Marital status: Divorced    Spouse name: Not on file   Number of children: Not on file   Years of education: Not on file   Highest education level: Associate degree: academic program  Occupational History   Not on file  Tobacco Use   Smoking status:  Former    Current packs/day: 0.00    Types: Cigarettes    Quit date: 04/04/1989    Years since quitting: 34.7   Smokeless tobacco: Never  Vaping Use   Vaping status: Never Used  Substance and Sexual Activity   Alcohol use: Yes    Comment: occassionally- 2 glassses of wine 2x/month   Drug use: No    Comment: take meds as prescribed   Sexual activity: Not Currently  Other Topics Concern   Not on file  Social History Narrative   Not on file   Social Drivers of Health   Financial Resource Strain: High Risk (12/25/2023)   Overall Financial Resource Strain (CARDIA)    Difficulty of Paying Living Expenses: Very hard  Food Insecurity: Food Insecurity Present (12/25/2023)   Hunger Vital Sign    Worried About Running Out of Food in the Last Year: Sometimes true    Ran Out of Food in the Last Year: Never true  Transportation Needs: No Transportation Needs (12/25/2023)   PRAPARE - Administrator, Civil Service (Medical): No    Lack of Transportation (Non-Medical): No  Physical Activity: Insufficiently Active (12/25/2023)   Exercise Vital Sign    Days of Exercise per Week: 4 days    Minutes of Exercise per Session: 20 min  Stress: No Stress Concern Present (12/25/2023)   [employer] of Occupational Health - Occupational Stress Questionnaire    Feeling of Stress : Only a little  Social Connections: Moderately Integrated (12/25/2023)   Social Connection and Isolation Panel [NHANES]    Frequency of Communication with Friends and Family: More than three times a week    Frequency of Social Gatherings with Friends and Family: Twice a week    Attends Religious Services: More than 4 times per year    Active Member of [employer] or Organizations: Yes    Attends Engineer, structural: More than 4 times per year    Marital Status: Never married  Catering manager Violence: Not on file    Outpatient Medications Prior to Visit  Medication Sig Dispense Refill   allopurinol  (ZYLOPRIM) 100 MG tablet TAKE ONE TABLET BY MOUTH TWICE A DAY 30 tablet 4   ALPRAZolam (XANAX) 0.25 MG tablet Take 1 tablet (0.25 mg total) by mouth 2 (two) times daily as needed for anxiety. 5 tablet 1   atorvastatin (LIPITOR) 40 MG tablet Take 1 tablet (40 mg total) by mouth daily. 90 tablet 3   beclomethasone (BECONASE-AQ) 42 MCG/SPRAY nasal spray Place 1 spray into both nostrils 2 (two) times daily. Dose is for each nostril. 25 g 1   cholecalciferol (VITAMIN D3) 25 MCG (1000 UNIT) tablet Take 2,000 Units  by mouth daily.     colchicine 0.6 MG tablet TAKE 2 TABLETS BY MOUTH ONCE THEN 1 TABLET BY MOUTH EVERY 2 HOURS AS NEEDED PAIN TIL PAIN GONE, MAXIMUM OF 6 TABLETS IN 24 HOURS OR INTOLERABLE DIARRHEA 6 tablet 2   cyclobenzaprine (FLEXERIL) 10 MG tablet TAKE ONE-HALF TO ONE TABLET BY MOUTH TWICE DAILY AS NEEDED FOR MUSCLE SPASM 40 tablet 2   fentaNYL (DURAGESIC) 25 MCG/HR Place 1 patch onto the skin every 3 (three) days. 10 patch 0   lisinopril (ZESTRIL) 10 MG tablet TAKE ONE TABLET BY MOUTH ONE TIME DAILY 90 tablet 1   metoprolol succinate (TOPROL-XL) 25 MG 24 hr tablet TAKE ONE TABLET BY MOUTH ONE TIME DAILY 90 tablet 1   NONFORMULARY OR COMPOUNDED ITEM Semaglutide 5mg  and cyancobalamin 0.21ml Take 1ml once a week #61ml 1 each 0   nystatin cream (MYCOSTATIN) Apply 1 Application topically 2 (two) times daily. 30 g 02   ondansetron (ZOFRAN-ODT) 8 MG disintegrating tablet 8mg  ODT q4 hours prn nausea 10 tablet 0   Oxycodone HCl 20 MG TABS Take 1 tablet (20 mg total) by mouth 4 (four) times daily as needed. 120 tablet 0   promethazine (PHENERGAN) 25 MG suppository Place 1 suppository (25 mg total) rectally every 6 (six) hours as needed for nausea or vomiting. 12 each 0   Semaglutide, 1 MG/DOSE, 4 MG/3ML SOPN Inject 1 mg as directed once a week. 3 mL 1   Semaglutide-Weight Management 1.7 MG/0.75ML SOAJ Inject 1.7 mg into the skin once a week. 3 mL 1   traZODone (DESYREL) 100 MG tablet TAKE ONE TABLET  BY MOUTH AT BEDTIME AS NEEDED FOR SLEEP 90 tablet 1   oxyCODONE-acetaminophen (PERCOCET) 5-325 MG tablet Take 2 tablets by mouth every 4 (four) hours as needed. (Patient not taking: Reported on 12/10/2023) 20 tablet 0   No facility-administered medications prior to visit.    Allergies  Allergen Reactions   Codeine Nausea And Vomiting   Cymbalta [Duloxetine Hcl] Other (See Comments)    Headaches   Fiorinal [Butalbital-Aspirin-Caffeine] Other (See Comments)    Mental Clarity.    Review of Systems  Constitutional:  Positive for malaise/fatigue. Negative for fever.  HENT:  Negative for congestion.   Eyes:  Negative for blurred vision.  Respiratory:  Negative for shortness of breath.   Cardiovascular:  Negative for chest pain, palpitations and leg swelling.  Gastrointestinal:  Negative for abdominal pain, blood in stool and nausea.  Genitourinary:  Negative for dysuria and frequency.  Musculoskeletal:  Positive for back pain, joint pain and myalgias. Negative for falls.  Skin:  Negative for rash.  Neurological:  Negative for dizziness, loss of consciousness and headaches.  Endo/Heme/Allergies:  Negative for environmental allergies.  Psychiatric/Behavioral:  Negative for depression. The patient is not nervous/anxious.        Objective:    Physical Exam Constitutional:      General: She is not in acute distress.    Appearance: Normal appearance. She is well-developed. She is not toxic-appearing.  HENT:     Head: Normocephalic and atraumatic.     Right Ear: External ear normal.     Left Ear: External ear normal.     Nose: Nose normal.  Eyes:     General:        Right eye: No discharge.        Left eye: No discharge.     Conjunctiva/sclera: Conjunctivae normal.  Neck:     Thyroid: No thyromegaly.  Comments: Right lobe of thyroid diffusely and mildly enlarged Cardiovascular:     Rate and Rhythm: Normal rate and regular rhythm.     Heart sounds: Normal heart sounds. No  murmur heard. Pulmonary:     Effort: Pulmonary effort is normal. No respiratory distress.     Breath sounds: Normal breath sounds.  Abdominal:     General: Bowel sounds are normal.     Palpations: Abdomen is soft.     Tenderness: There is no abdominal tenderness. There is no guarding.  Musculoskeletal:        General: Normal range of motion.     Cervical back: Neck supple.  Lymphadenopathy:     Cervical: No cervical adenopathy.  Skin:    General: Skin is warm and dry.  Neurological:     Mental Status: She is alert and oriented to person, place, and time.  Psychiatric:        Mood and Affect: Mood normal.        Behavior: Behavior normal.        Thought Content: Thought content normal.        Judgment: Judgment normal.     BP 112/64 (BP Location: Left Arm, Patient Position: Sitting, Cuff Size: Large)   Pulse 79   Temp 98.4 F (36.9 C) (Oral)   Resp 18   Ht 5\' 6"  (1.676 m)   Wt 220 lb 9.6 oz (100.1 kg)   SpO2 95%   BMI 35.61 kg/m  Wt Readings from Last 3 Encounters:  12/28/23 220 lb 9.6 oz (100.1 kg)  12/10/23 217 lb (98.4 kg)  12/08/23 218 lb (98.9 kg)    Diabetic Foot Exam - Simple   No data filed    Lab Results  Component Value Date   WBC 12.5 (H) 12/08/2023   HGB 17.4 (H) 12/08/2023   HCT 50.0 (H) 12/08/2023   PLT 288 12/08/2023   GLUCOSE 151 (H) 12/08/2023   CHOL 139 08/24/2023   TRIG 119.0 08/24/2023   HDL 42.60 08/24/2023   LDLDIRECT 58.0 02/16/2023   LDLCALC 73 08/24/2023   ALT 21 12/08/2023   AST 25 12/08/2023   NA 136 12/08/2023   K 4.3 12/08/2023   CL 100 12/08/2023   CREATININE 0.62 12/08/2023   BUN 12 12/08/2023   CO2 19 (L) 12/08/2023   TSH 1.62 08/24/2023   HGBA1C 5.9 02/16/2023    Lab Results  Component Value Date   TSH 1.62 08/24/2023   Lab Results  Component Value Date   WBC 12.5 (H) 12/08/2023   HGB 17.4 (H) 12/08/2023   HCT 50.0 (H) 12/08/2023   MCV 88.7 12/08/2023   PLT 288 12/08/2023   Lab Results  Component Value  Date   NA 136 12/08/2023   K 4.3 12/08/2023   CO2 19 (L) 12/08/2023   GLUCOSE 151 (H) 12/08/2023   BUN 12 12/08/2023   CREATININE 0.62 12/08/2023   BILITOT 1.6 (H) 12/08/2023   ALKPHOS 69 12/08/2023   AST 25 12/08/2023   ALT 21 12/08/2023   PROT 8.5 (H) 12/08/2023   ALBUMIN 4.7 12/08/2023   CALCIUM 11.2 (H) 12/08/2023   ANIONGAP 17 (H) 12/08/2023   GFR 97.39 08/24/2023   Lab Results  Component Value Date   CHOL 139 08/24/2023   Lab Results  Component Value Date   HDL 42.60 08/24/2023   Lab Results  Component Value Date   LDLCALC 73 08/24/2023   Lab Results  Component Value Date   TRIG 119.0 08/24/2023  Lab Results  Component Value Date   CHOLHDL 3 08/24/2023   Lab Results  Component Value Date   HGBA1C 5.9 02/16/2023       Assessment & Plan:  Hypothyroidism, unspecified type Assessment & Plan: On Levothyroxine, continue to monitor   Orders: -     TSH -     US THYROID; Future  Hyperlipidemia, unspecified hyperlipidemia type Assessment & Plan: Encourage heart healthy diet such as MIND or DASH diet, increase exercise, avoid trans fats, simple carbohydrates and processed foods, consider a krill or fish or flaxseed oil cap daily.  Tolerating Atorvastatin  Orders: -     Lipid panel -     Apolipoprotein B -     Lipoprotein A (LPA)  Obesity, unspecified class, unspecified obesity type, unspecified whether serious comorbidity present Assessment & Plan: She has been taking a compounded version of a GLP1 from a company called [employer] and has requested we prescribe it but she will have to see someone who specializes in compounded meds.    Vitamin D deficiency Assessment & Plan: Supplement and monitor   Orders: -     VITAMIN D 25 Hydroxy (Vit-D Deficiency, Fractures)  Hyperglycemia Assessment & Plan: hgba1c acceptable, minimize simple carbs. Increase exercise as tolerated.  Orders: -     Hemoglobin A1c  Essential hypertension Assessment &  Plan: Well controlled, no changes to meds. Encouraged heart healthy diet such as the DASH diet and exercise as tolerated.    Orders: -     Comprehensive metabolic panel with GFR -     CBC with Differential/Platelet -     TSH  Hyperparathyroidism (HCC) -     Parathyroid hormone, intact (no Ca) -     US THYROID; Future  Chronic gout involving toe of right foot without tophus, unspecified cause -     Uric acid  Disorder of vitamin B12 -     Vitamin B12  Myalgia -     Rheumatoid factor -     ANA  Thyromegaly    Assessment and Plan Assessment & Plan Thyroid disorder Slightly enlarged thyroid, more prominent on the right side. Concerns about GLP-1 medications' impact on thyroid health, particularly thyroid cancers. Abnormal calcium levels and hyperparathyroidism history necessitate further investigation. - Order thyroid ultrasound - Order blood work including thyroid function tests, PTH, and vitamin D levels  Hyperglycemia Using semaglutide for weight management and hyperglycemia. Concerns about compounded semaglutide products' safety and regulation. A1c not checked since May of the previous year. - Order A1c test to assess current glycemic control  Hyperlipidemia Discontinued atorvastatin. Family history of heart disease and stroke increases risk. Previous genetic testing for cholesterol-related markers was inconclusive. - Order ApoB and Lp(a) tests to assess genetic risk factors for hyperlipidemia  Vitamin D deficiency Inconsistent with supplementation. Monitoring needed to ensure adequate levels. - Order vitamin D level test  General Health Maintenance Bone density test needed due to post-menopausal status and vitamin D deficiency. - Schedule bone density test  Follow-up Follow-up appointment scheduled for Feb 25, 2024, to review test results and make further management decisions. - Maintain follow-up appointment on Feb 25, 2024     Danise Edge, MD

## 2023-12-29 ENCOUNTER — Encounter: Payer: Self-pay | Admitting: Family Medicine

## 2023-12-29 LAB — CBC WITH DIFFERENTIAL/PLATELET
Basophils Absolute: 0.1 10*3/uL (ref 0.0–0.1)
Basophils Relative: 0.7 % (ref 0.0–3.0)
Eosinophils Absolute: 0.1 10*3/uL (ref 0.0–0.7)
Eosinophils Relative: 1 % (ref 0.0–5.0)
HCT: 42.8 % (ref 36.0–46.0)
Hemoglobin: 14.2 g/dL (ref 12.0–15.0)
Lymphocytes Relative: 14.1 % (ref 12.0–46.0)
Lymphs Abs: 1.4 10*3/uL (ref 0.7–4.0)
MCHC: 33.3 g/dL (ref 30.0–36.0)
MCV: 93.2 fl (ref 78.0–100.0)
Monocytes Absolute: 0.6 10*3/uL (ref 0.1–1.0)
Monocytes Relative: 5.6 % (ref 3.0–12.0)
Neutro Abs: 7.8 10*3/uL — ABNORMAL HIGH (ref 1.4–7.7)
Neutrophils Relative %: 78.6 % — ABNORMAL HIGH (ref 43.0–77.0)
Platelets: 219 10*3/uL (ref 150.0–400.0)
RBC: 4.59 Mil/uL (ref 3.87–5.11)
RDW: 13.8 % (ref 11.5–15.5)
WBC: 9.9 10*3/uL (ref 4.0–10.5)

## 2023-12-29 LAB — TSH: TSH: 0.86 u[IU]/mL (ref 0.35–5.50)

## 2023-12-29 LAB — LIPID PANEL
Cholesterol: 228 mg/dL — ABNORMAL HIGH (ref 0–200)
HDL: 48.1 mg/dL (ref >39.00)
LDL Cholesterol: 150 mg/dL — ABNORMAL HIGH (ref 0–99)
NonHDL: 180.32
Total CHOL/HDL Ratio: 5
Triglycerides: 153 mg/dL — ABNORMAL HIGH (ref 0.0–149.0)
VLDL: 30.6 mg/dL (ref 0.0–40.0)

## 2023-12-29 LAB — COMPREHENSIVE METABOLIC PANEL WITH GFR
ALT: 11 U/L (ref 0–35)
AST: 10 U/L (ref 0–37)
Albumin: 4.1 g/dL (ref 3.5–5.2)
Alkaline Phosphatase: 68 U/L (ref 39–117)
BUN: 15 mg/dL (ref 6–23)
CO2: 29 meq/L (ref 19–32)
Calcium: 10.4 mg/dL (ref 8.4–10.5)
Chloride: 100 meq/L (ref 96–112)
Creatinine, Ser: 0.61 mg/dL (ref 0.40–1.20)
GFR: 95.98 mL/min (ref >60.00)
Glucose, Bld: 100 mg/dL — ABNORMAL HIGH (ref 70–99)
Potassium: 4.2 meq/L (ref 3.5–5.1)
Sodium: 136 meq/L (ref 135–145)
Total Bilirubin: 0.3 mg/dL (ref 0.2–1.2)
Total Protein: 6.4 g/dL (ref 6.0–8.3)

## 2023-12-29 LAB — VITAMIN B12: Vitamin B-12: 1103 pg/mL — ABNORMAL HIGH (ref 211–911)

## 2023-12-29 LAB — URIC ACID: Uric Acid, Serum: 6.8 mg/dL (ref 2.4–7.0)

## 2023-12-29 LAB — APOLIPOPROTEIN B: Apolipoprotein B: 109 mg/dL — ABNORMAL HIGH (ref <90)

## 2023-12-29 LAB — HEMOGLOBIN A1C: Hgb A1c MFr Bld: 5.7 % (ref 4.6–6.5)

## 2023-12-29 LAB — VITAMIN D 25 HYDROXY (VIT D DEFICIENCY, FRACTURES): VITD: 45.52 ng/mL (ref 30.00–100.00)

## 2023-12-30 ENCOUNTER — Other Ambulatory Visit: Payer: Self-pay

## 2023-12-30 MED ORDER — ALPRAZOLAM 0.25 MG PO TABS: 0.2500 mg | ORAL_TABLET | Freq: Two times a day (BID) | ORAL | 1 refills | Status: DC | PRN

## 2023-12-30 NOTE — Telephone Encounter (Signed)
 Copied from CRM [id number]. Topic: Clinical - Medication Refill >> Dec 30, 2023  1:07 PM Aletta Edouard wrote: Most Recent Primary Care Visit:  Provider: Clayborne Dana  Department: [facility]  Visit Type: PHYSICAL  Date: 08/24/2023  Medication: ALPRAZolam (XANAX) 0.25 MG tablet  Has the patient contacted their pharmacy? Yes (Agent: If no, request that the patient contact the pharmacy for the refill. If patient does not wish to contact the pharmacy document the reason why and proceed with request.) (Agent: If yes, when and what did the pharmacy advise?)  Is this the correct pharmacy for this prescription? Yes If no, delete pharmacy and type the correct one.  This is the patient's preferred pharmacy:  [facility] [address] - [place], [place] - [address], [address] [place] [address] Phone: [phone] Fax: [phone]    Has the prescription been filled recently? No  Is the patient out of the medication? Yes  Has the patient been seen for an appointment in the last year OR does the patient have an upcoming appointment? Yes  Can we respond through MyChart? Yes  Agent: Please be advised that Rx refills may take up to 3 business days. We ask that you follow-up with your pharmacy.      Patient also stated that she would like she has not been taking her medication that is for cholesterol

## 2023-12-31 ENCOUNTER — Other Ambulatory Visit (HOSPITAL_BASED_OUTPATIENT_CLINIC_OR_DEPARTMENT_OTHER)

## 2023-12-31 ENCOUNTER — Ambulatory Visit (HOSPITAL_BASED_OUTPATIENT_CLINIC_OR_DEPARTMENT_OTHER)

## 2023-12-31 ENCOUNTER — Encounter: Payer: Self-pay | Admitting: Family Medicine

## 2023-12-31 LAB — ANTI-NUCLEAR AB-TITER (ANA TITER): ANA Titer 1: 1:40 {titer} — ABNORMAL HIGH

## 2023-12-31 LAB — ANA: Anti Nuclear Antibody (ANA): POSITIVE — AB

## 2023-12-31 LAB — LIPOPROTEIN A (LPA): Lipoprotein (a): 16 nmol/L (ref <75)

## 2023-12-31 LAB — PARATHYROID HORMONE, INTACT (NO CA): PTH: 51 pg/mL (ref 16–77)

## 2023-12-31 LAB — RHEUMATOID FACTOR: Rheumatoid fact SerPl-aCnc: <10 [IU]/mL (ref <14)

## 2023-12-31 NOTE — Telephone Encounter (Signed)
Rx sent by Padonda.

## 2024-01-01 ENCOUNTER — Other Ambulatory Visit: Payer: Self-pay | Admitting: Family Medicine

## 2024-01-01 MED ORDER — ALPRAZOLAM 0.25 MG PO TABS: 0.2500 mg | ORAL_TABLET | Freq: Two times a day (BID) | ORAL | 1 refills | Status: DC | PRN

## 2024-01-01 NOTE — Telephone Encounter (Signed)
 Copied from CRM [id number]. Topic: Clinical - Prescription Issue >> Jan 01, 2024  1:02 PM Turkey A wrote: Reason for CRM: Patient called and said that she wants a nurse to call her back regarding her atorvastatin (LIPITOR) 40 MG tablet and ALPRAZolam (XANAX) 0.25 MG tablet-please contact

## 2024-01-04 ENCOUNTER — Encounter: Payer: Self-pay | Admitting: Emergency Medicine

## 2024-01-04 ENCOUNTER — Telehealth: Payer: Self-pay

## 2024-01-04 DIAGNOSIS — R1013 Epigastric pain: Secondary | ICD-10-CM

## 2024-01-04 DIAGNOSIS — R112 Nausea with vomiting, unspecified: Secondary | ICD-10-CM

## 2024-01-04 NOTE — Telephone Encounter (Signed)
-----   Message from Shellia Cleverly sent at 01/04/2024  9:25 AM EDT ----- I spoke with our advanced biliary service about her case. 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 Given the ongoing/recurrent abdominal pain and nausea/vomiting, let's plan for the following:  - HIDA now. If positive, will refer to Gen Surgery.  - If HIDA negative, will plan on diagnostic EUS with cyst sampling  Thanks

## 2024-01-04 NOTE — Telephone Encounter (Signed)
 Good morning Candace,  As we discussed by phone earlier, you have been scheduled for a HIDA scan at [facility] (1st floor) on 01-14-24. Please arrive 30 minutes prior to your scheduled appointment at  7:84ON. Make certain not to have anything to eat or drink at least 6 hours prior to your test. Should this appointment date or time not work well for you, please call radiology scheduling at [phone].   Please do not take Oxycodone, Flexeril, or use Fentanyl patch 6 hours prior to the appointment. ______________________________________________ Hepatobiliary (HIDA) scan is an imaging procedure used to diagnose problems in the liver, gallbladder and bile ducts. In the HIDA scan, a radioactive chemical or tracer is injected into a vein in your arm. The tracer is handled by the liver like bile. Bile is a fluid produced and excreted by your liver that helps your digestive system break down fats in the foods you eat. Bile is stored in your gallbladder and the gallbladder releases the bile when you eat a meal. A special nuclear medicine scanner (gamma camera) tracks the flow of the tracer from your liver into your gallbladder and small intestine.   During your HIDA scan  You'll be asked to change into a hospital gown before your HIDA scan begins. Your health care team will position you on a table, usually on your back. The radioactive tracer is then injected into a vein in your arm.The tracer travels through your bloodstream to your liver, where it's taken up by the bile-producing cells. The radioactive tracer travels with the bile from your liver into your gallbladder and through your bile ducts to your small intestine.You may feel some pressure while the radioactive tracer is injected into your vein. As you lie on the table, a special gamma camera is positioned over your abdomen taking pictures of the tracer as it moves through your body. The gamma camera takes pictures continually for about an hour.  You'll need to keep still during the HIDA scan. This can become uncomfortable, but you may find that you can lessen the discomfort by taking deep breaths and thinking about other things. Tell your health care team if you're uncomfortable. The radiologist will watch on a computer the progress of the radioactive tracer through your body. The HIDA scan may be stopped when the radioactive tracer is seen in the gallbladder and enters your small intestine. This typically takes about an hour. In some cases extra imaging will be performed if original images aren't satisfactory, if morphine is given to help visualize the gallbladder or if the medication CCK is given to look at the contraction of the gallbladder. This test typically takes 2 hours to complete. ______________________________________________  Due to recent changes in healthcare laws, you may see the results of your imaging and laboratory studies on MyChart before your provider has had a chance to review them.  We understand that in some cases there may be results that are confusing or concerning to you. Not all laboratory results come back in the same time frame and the provider may be waiting for multiple results in order to interpret others.  Please give [place] 48 hours in order for your provider to thoroughly review all the results before contacting the office for clarification of your results.   Please contact me with any further questions or concerns by calling [phone] or sending a MyChart message.  Joni Reining

## 2024-01-04 NOTE — Telephone Encounter (Signed)
 Patient sent a telephone message stating she received the Alprazolam. Provider is already aware of Lipitor dosing concern from a previous message the patient sent last week.

## 2024-01-05 ENCOUNTER — Other Ambulatory Visit: Payer: Self-pay | Admitting: Family Medicine

## 2024-01-05 ENCOUNTER — Other Ambulatory Visit: Payer: Self-pay | Admitting: Emergency Medicine

## 2024-01-05 DIAGNOSIS — M791 Myalgia, unspecified site: Secondary | ICD-10-CM

## 2024-01-05 MED ORDER — OXYCODONE HCL 20 MG PO TABS: 1.0000 | ORAL_TABLET | Freq: Four times a day (QID) | ORAL | 0 refills | Status: DC | PRN

## 2024-01-05 NOTE — Telephone Encounter (Signed)
 Order was placed for repeat ANA to be drawn.

## 2024-01-06 ENCOUNTER — Ambulatory Visit (HOSPITAL_BASED_OUTPATIENT_CLINIC_OR_DEPARTMENT_OTHER)
Admission: RE | Admit: 2024-01-06 | Discharge: 2024-01-06 | Disposition: A | Discharge status: Home / Self Care | Source: Ambulatory Visit | Attending: Family Medicine | Admitting: Family Medicine

## 2024-01-06 ENCOUNTER — Encounter (HOSPITAL_BASED_OUTPATIENT_CLINIC_OR_DEPARTMENT_OTHER): Payer: Self-pay

## 2024-01-06 DIAGNOSIS — Z1231 Encounter for screening mammogram for malignant neoplasm of breast: Secondary | ICD-10-CM

## 2024-01-06 DIAGNOSIS — Z780 Asymptomatic menopausal state: Secondary | ICD-10-CM | POA: Diagnosis not present

## 2024-01-06 DIAGNOSIS — E039 Hypothyroidism, unspecified: Secondary | ICD-10-CM | POA: Insufficient documentation

## 2024-01-06 DIAGNOSIS — E042 Nontoxic multinodular goiter: Secondary | ICD-10-CM | POA: Insufficient documentation

## 2024-01-06 DIAGNOSIS — E2839 Other primary ovarian failure: Secondary | ICD-10-CM | POA: Diagnosis not present

## 2024-01-06 DIAGNOSIS — M8589 Other specified disorders of bone density and structure, multiple sites: Secondary | ICD-10-CM | POA: Diagnosis not present

## 2024-01-06 DIAGNOSIS — E213 Hyperparathyroidism, unspecified: Secondary | ICD-10-CM | POA: Insufficient documentation

## 2024-01-06 DIAGNOSIS — Z13820 Encounter for screening for osteoporosis: Secondary | ICD-10-CM | POA: Insufficient documentation

## 2024-01-06 DIAGNOSIS — M85852 Other specified disorders of bone density and structure, left thigh: Secondary | ICD-10-CM | POA: Diagnosis not present

## 2024-01-07 ENCOUNTER — Encounter: Payer: Self-pay | Admitting: Family Medicine

## 2024-01-12 ENCOUNTER — Other Ambulatory Visit: Payer: Self-pay | Admitting: Family Medicine

## 2024-01-14 ENCOUNTER — Other Ambulatory Visit (HOSPITAL_COMMUNITY)

## 2024-01-14 ENCOUNTER — Encounter: Payer: Self-pay | Admitting: Family Medicine

## 2024-01-19 ENCOUNTER — Encounter (HOSPITAL_COMMUNITY)
Admission: RE | Admit: 2024-01-19 | Discharge: 2024-01-19 | Disposition: A | Discharge status: Home / Self Care | Source: Ambulatory Visit | Attending: Gastroenterology | Admitting: Gastroenterology

## 2024-01-19 DIAGNOSIS — K8020 Calculus of gallbladder without cholecystitis without obstruction: Secondary | ICD-10-CM | POA: Diagnosis not present

## 2024-01-19 DIAGNOSIS — R112 Nausea with vomiting, unspecified: Secondary | ICD-10-CM | POA: Insufficient documentation

## 2024-01-19 DIAGNOSIS — R1013 Epigastric pain: Secondary | ICD-10-CM | POA: Diagnosis not present

## 2024-01-19 DIAGNOSIS — R109 Unspecified abdominal pain: Secondary | ICD-10-CM | POA: Diagnosis not present

## 2024-01-19 DIAGNOSIS — R110 Nausea: Secondary | ICD-10-CM | POA: Diagnosis not present

## 2024-01-19 MED ORDER — MORPHINE SULFATE (PF) 2 MG/ML IV SOLN
INTRAVENOUS | Status: AC
  Filled 2024-01-19: qty 2

## 2024-01-19 MED ORDER — TECHNETIUM TC 99M MEBROFENIN IV KIT
5.3800 | PACK | Freq: Once | INTRAVENOUS | Status: AC
  Administered 2024-01-19: 5.38 via INTRAVENOUS

## 2024-01-19 NOTE — Telephone Encounter (Signed)
 They continued with the study.  Toula French, RT on 01/19/2024 10:36 AM  5.38 mCi Tc4m Mebrofenin  IV RAC @ 1018 MM; Total Bilirubin - 0.3 on 12/28/2023   Hepatobiliary W/ EF for abdominal pain, epigastric; nausea and vomiting, unspecified vomiting type   Has a fentanyl  patch that's worn everyday, spoke with Dr. Dortha Gauss and was given permission to continue with exam; no abdominal pain currently; ovarian cyst removal "years ago"; last round of Nausea and vomiting 01/08/2024; not on morphine  and does not have a morphine  allergy   EOV

## 2024-01-19 NOTE — Telephone Encounter (Signed)
 Patient currently at [facility] for HIDA scan. Please advise, thanks.

## 2024-01-19 NOTE — Telephone Encounter (Signed)
 Inbound call from [place] from [facility], with a call back number at [phone]. Requesting to speak with provider in regards to patients fentanyl  patch.   Inquiring if provider would still like to proceed with study.   Please advise. Thank you

## 2024-01-20 ENCOUNTER — Encounter: Payer: Self-pay | Admitting: Family Medicine

## 2024-01-21 ENCOUNTER — Other Ambulatory Visit: Payer: Self-pay | Admitting: Emergency Medicine

## 2024-01-21 ENCOUNTER — Encounter: Payer: Self-pay | Admitting: Gastroenterology

## 2024-01-21 DIAGNOSIS — M791 Myalgia, unspecified site: Secondary | ICD-10-CM

## 2024-01-21 NOTE — Telephone Encounter (Signed)
 Called and spoke with patient. She made a lab appointment for 01/28/24

## 2024-01-28 ENCOUNTER — Other Ambulatory Visit (INDEPENDENT_AMBULATORY_CARE_PROVIDER_SITE_OTHER)

## 2024-01-28 DIAGNOSIS — M791 Myalgia, unspecified site: Secondary | ICD-10-CM

## 2024-01-30 LAB — ANTI-NUCLEAR AB-TITER (ANA TITER): ANA Titer 1: 1:40 {titer} — ABNORMAL HIGH

## 2024-01-30 LAB — ANA: Anti Nuclear Antibody (ANA): POSITIVE — AB

## 2024-01-31 ENCOUNTER — Encounter: Payer: Self-pay | Admitting: Family Medicine

## 2024-02-01 ENCOUNTER — Other Ambulatory Visit: Payer: Self-pay | Admitting: Family Medicine

## 2024-02-01 DIAGNOSIS — G8929 Other chronic pain: Secondary | ICD-10-CM

## 2024-02-01 DIAGNOSIS — M791 Myalgia, unspecified site: Secondary | ICD-10-CM

## 2024-02-01 DIAGNOSIS — M25562 Pain in left knee: Secondary | ICD-10-CM

## 2024-02-01 DIAGNOSIS — R768 Other specified abnormal immunological findings in serum: Secondary | ICD-10-CM

## 2024-02-01 DIAGNOSIS — M1A9XX0 Chronic gout, unspecified, without tophus (tophi): Secondary | ICD-10-CM

## 2024-02-01 DIAGNOSIS — M25531 Pain in right wrist: Secondary | ICD-10-CM

## 2024-02-01 DIAGNOSIS — M542 Cervicalgia: Secondary | ICD-10-CM

## 2024-02-01 DIAGNOSIS — M79672 Pain in left foot: Secondary | ICD-10-CM

## 2024-02-01 DIAGNOSIS — M1710 Unilateral primary osteoarthritis, unspecified knee: Secondary | ICD-10-CM

## 2024-02-01 DIAGNOSIS — M47817 Spondylosis without myelopathy or radiculopathy, lumbosacral region: Secondary | ICD-10-CM

## 2024-02-01 DIAGNOSIS — M5416 Radiculopathy, lumbar region: Secondary | ICD-10-CM

## 2024-02-02 ENCOUNTER — Other Ambulatory Visit: Payer: Self-pay

## 2024-02-02 DIAGNOSIS — M1A9XX0 Chronic gout, unspecified, without tophus (tophi): Secondary | ICD-10-CM

## 2024-02-02 MED ORDER — CYCLOBENZAPRINE HCL 10 MG PO TABS
ORAL_TABLET | ORAL | 2 refills | Status: AC
Start: 2024-02-02 — End: ?

## 2024-02-02 MED ORDER — ALLOPURINOL 100 MG PO TABS: 100.0000 mg | ORAL_TABLET | Freq: Two times a day (BID) | ORAL | 1 refills | Status: AC

## 2024-02-02 MED ORDER — OXYCODONE HCL 20 MG PO TABS
1.0000 | ORAL_TABLET | Freq: Four times a day (QID) | ORAL | 0 refills | Status: DC | PRN
Start: 2024-02-02 — End: 2024-03-07

## 2024-02-03 ENCOUNTER — Telehealth: Payer: Self-pay | Admitting: Family Medicine

## 2024-02-03 NOTE — Telephone Encounter (Signed)
 Copied from CRM [id number]. Topic: Referral - Question >> Feb 03, 2024 11:11 AM Jethro Morrison wrote: Reason for CRM: PT STATED SHE IS SCHEDULED TO SEE A RHEUMATOLOGIST BUT WOULD LIKE TO SEE SOMEONE CLOSER TO HER HOME NOT WILLING TO GO TO [place]. WOULD LIKE TO SEE SHAILI DEVESHAWAR THANKS

## 2024-02-04 ENCOUNTER — Telehealth: Payer: Self-pay | Admitting: Family Medicine

## 2024-02-04 NOTE — Telephone Encounter (Signed)
 Copied from CRM [id number]. Topic: Referral - Question >> Feb 04, 2024  1:03 PM Earnestine Goes B wrote: Reason for CRM: pt called to change her referral for rheumatology to Dr. Clydie Darter,  [facility] [address] in  [place].   [id number]       Pt states that [place] is too far, she will not be going/ Reqiuest a referral be send to the provider she has requested. Please call pt back with a update at [phone]

## 2024-02-15 ENCOUNTER — Ambulatory Visit: Payer: Medicaid Other | Admitting: Gastroenterology

## 2024-02-22 ENCOUNTER — Encounter: Payer: Self-pay | Admitting: Family Medicine

## 2024-02-23 NOTE — Assessment & Plan Note (Signed)
 Hydrate and monitor

## 2024-02-23 NOTE — Assessment & Plan Note (Signed)
 hgba1c acceptable, minimize simple carbs. Increase exercise as tolerated.

## 2024-02-23 NOTE — Assessment & Plan Note (Signed)
 Encourage heart healthy diet such as MIND or DASH diet, increase exercise, avoid trans fats, simple carbohydrates and processed foods, consider a krill or fish or flaxseed oil cap daily. Tolerating Atorvastatin

## 2024-02-23 NOTE — Assessment & Plan Note (Signed)
 Well controlled, no changes to meds. Encouraged heart healthy diet such as the DASH diet and exercise as tolerated.

## 2024-02-23 NOTE — Assessment & Plan Note (Signed)
 Supplement and monitor

## 2024-02-23 NOTE — Assessment & Plan Note (Signed)
Encouraged good sleep hygiene such as dark, quiet room. No blue/green glowing lights such as computer screens in bedroom. No alcohol or stimulants in evening. Cut down on caffeine as able. Regular exercise is helpful but not just prior to bed time. Prescription for Trazadone given

## 2024-02-25 ENCOUNTER — Encounter: Payer: Self-pay | Admitting: Family Medicine

## 2024-02-25 ENCOUNTER — Ambulatory Visit: Payer: Medicaid Other | Admitting: Family Medicine

## 2024-02-25 VITALS — BP 122/84 | HR 72 | Resp 16 | Ht 66.0 in | Wt 215.2 lb

## 2024-02-25 DIAGNOSIS — Z79899 Other long term (current) drug therapy: Secondary | ICD-10-CM

## 2024-02-25 DIAGNOSIS — M1A9XX0 Chronic gout, unspecified, without tophus (tophi): Secondary | ICD-10-CM

## 2024-02-25 DIAGNOSIS — R739 Hyperglycemia, unspecified: Secondary | ICD-10-CM

## 2024-02-25 DIAGNOSIS — G4700 Insomnia, unspecified: Secondary | ICD-10-CM | POA: Diagnosis not present

## 2024-02-25 DIAGNOSIS — I10 Essential (primary) hypertension: Secondary | ICD-10-CM | POA: Diagnosis not present

## 2024-02-25 DIAGNOSIS — F418 Other specified anxiety disorders: Secondary | ICD-10-CM | POA: Diagnosis not present

## 2024-02-25 DIAGNOSIS — M25562 Pain in left knee: Secondary | ICD-10-CM | POA: Diagnosis not present

## 2024-02-25 DIAGNOSIS — M25532 Pain in left wrist: Secondary | ICD-10-CM

## 2024-02-25 DIAGNOSIS — E785 Hyperlipidemia, unspecified: Secondary | ICD-10-CM

## 2024-02-25 DIAGNOSIS — M25531 Pain in right wrist: Secondary | ICD-10-CM

## 2024-02-25 DIAGNOSIS — M25561 Pain in right knee: Secondary | ICD-10-CM

## 2024-02-25 DIAGNOSIS — E559 Vitamin D deficiency, unspecified: Secondary | ICD-10-CM | POA: Diagnosis not present

## 2024-02-25 MED ORDER — FLUOXETINE HCL 20 MG PO TABS: 20.0000 mg | ORAL_TABLET | Freq: Every day | ORAL | 3 refills | Status: DC

## 2024-02-25 MED ORDER — ALPRAZOLAM 0.25 MG PO TABS: 0.2500 mg | ORAL_TABLET | Freq: Two times a day (BID) | ORAL | 2 refills | Status: DC | PRN

## 2024-02-25 NOTE — Patient Instructions (Signed)
 60-80 ounces in 24 hours, 5-10 ounces every 1-2 hours Fish oil or krill oil caps Yoga or Taichi A multivitamin or brain supplement.  Minimum of 4000 steps a day and over 8000 is best  Learn something new

## 2024-02-25 NOTE — Progress Notes (Signed)
 Subjective:    Patient ID: [name] [name], female    DOB: 09-Sep-1961, 64 y.o.   MRN: [id number]  Chief Complaint  Patient presents with  . Medical Management of Chronic Issues    Patient presents today for a 2 month follow-up.  . Quality Metric Gaps    zoster    HPI Discussed the use of AI scribe software for clinical note transcription with the patient, who gave verbal consent to proceed.  History of Present Illness     Past Medical History:  Diagnosis Date  . Acute foot pain, right 01/13/2017  . Acute upper respiratory infections of unspecified site 06/12/2014  . Anxiety and depression    chronic  . Breast cancer screening 09/05/2016  . Cervical cancer screening 09/05/2016  . Chronic back pain   . Depression with anxiety 08/23/2008   Qualifier: Diagnosis of  By: Kingsley Penny MD, Darlene Ehlers   . Gout 05/05/2017  . History of colon polyps   . History of fibromyalgia    suspected   . Hypertension   . Insomnia   . Knee pain, right 06/24/2015  . Meningitis spinal   . Morbid obesity (HCC)   . Ovarian cyst, bilateral   . Preventative health care 06/24/2015  . PTSD (post-traumatic stress disorder)   . Vitamin D  deficiency 06/24/2015    Past Surgical History:  Procedure Laterality Date  . COLONOSCOPY    . OVARIAN CYST REMOVAL      Family History  Problem Relation Age of Onset  . Colon cancer Mother   . Breast cancer Mother        benign  . Rheum arthritis Father   . Diabetes Father   . Congestive Heart Failure Father   . Hypertension Father   . Stroke Father   . Hip fracture Maternal Grandmother   . COPD Maternal Grandfather 98  . Colon cancer Maternal Grandfather   . Lymphoma Paternal Grandmother   . Colon cancer Maternal Aunt   . Colon cancer Maternal Uncle 83  . Breast cancer Cousin        negative stage 3  . Breast cancer Maternal Great-grandmother   . ADD / ADHD Neg Hx   . Depression Neg Hx   . Alcohol abuse Neg Hx   . Drug abuse Neg Hx   . Esophageal  cancer Neg Hx     Social History   Socioeconomic History  . Marital status: Divorced    Spouse name: Not on file  . Number of children: Not on file  . Years of education: Not on file  . Highest education level: Associate degree: academic program  Occupational History  . Not on file  Tobacco Use  . Smoking status: Former    Current packs/day: 0.00    Types: Cigarettes    Quit date: 04/04/1989    Years since quitting: 34.9  . Smokeless tobacco: Never  Vaping Use  . Vaping status: Never Used  Substance and Sexual Activity  . Alcohol use: Yes    Comment: occassionally- 2 glassses of wine 2x/month  . Drug use: No    Comment: take meds as prescribed  . Sexual activity: Not Currently  Other Topics Concern  . Not on file  Social History Narrative  . Not on file   Social Drivers of Health   Financial Resource Strain: High Risk (12/25/2023)   Overall Financial Resource Strain (CARDIA)   . Difficulty of Paying Living Expenses: Very hard  Food Insecurity: Food  Insecurity Present (12/25/2023)   Hunger Vital Sign   . Worried About Programme researcher, broadcasting/film/video in the Last Year: Sometimes true   . Ran Out of Food in the Last Year: Never true  Transportation Needs: No Transportation Needs (12/25/2023)   PRAPARE - Transportation   . Lack of Transportation (Medical): No   . Lack of Transportation (Non-Medical): No  Physical Activity: Insufficiently Active (12/25/2023)   Exercise Vital Sign   . Days of Exercise per Week: 4 days   . Minutes of Exercise per Session: 20 min  Stress: No Stress Concern Present (12/25/2023)   [employer] of Occupational Health - Occupational Stress Questionnaire   . Feeling of Stress : Only a little  Social Connections: Moderately Integrated (12/25/2023)   Social Connection and Isolation Panel [NHANES]   . Frequency of Communication with Friends and Family: More than three times a week   . Frequency of Social Gatherings with Friends and Family: Twice a week   .  Attends Religious Services: More than 4 times per year   . Active Member of Clubs or Organizations: Yes   . Attends Banker Meetings: More than 4 times per year   . Marital Status: Never married  Intimate Partner Violence: Not on file    Outpatient Medications Prior to Visit  Medication Sig Dispense Refill  . allopurinol  (ZYLOPRIM ) 100 MG tablet Take 1 tablet (100 mg total) by mouth 2 (two) times daily. 180 tablet 1  . atorvastatin  (LIPITOR) 40 MG tablet TAKE ONE TABLET BY MOUTH ONE TIME DAILY 90 tablet 3  . beclomethasone (BECONASE-AQ) 42 MCG/SPRAY nasal spray Place 1 spray into both nostrils 2 (two) times daily. Dose is for each nostril. 25 g 1  . cholecalciferol (VITAMIN D3) 25 MCG (1000 UNIT) tablet Take 2,000 Units by mouth daily.    . colchicine  0.6 MG tablet TAKE 2 TABLETS BY MOUTH ONCE THEN 1 TABLET BY MOUTH EVERY 2 HOURS AS NEEDED PAIN TIL PAIN GONE, MAXIMUM OF 6 TABLETS IN 24 HOURS OR INTOLERABLE DIARRHEA 6 tablet 2  . cyclobenzaprine  (FLEXERIL ) 10 MG tablet TAKE ONE-HALF TO ONE TABLET BY MOUTH TWICE DAILY AS NEEDED FOR MUSCLE SPASM 40 tablet 2  . fentaNYL  (DURAGESIC ) 25 MCG/HR Place 1 patch onto the skin every 3 (three) days. 10 patch 0  . lisinopril  (ZESTRIL ) 10 MG tablet TAKE ONE TABLET BY MOUTH ONE TIME DAILY 90 tablet 1  . metoprolol  succinate (TOPROL -XL) 25 MG 24 hr tablet TAKE ONE TABLET BY MOUTH ONE TIME DAILY 90 tablet 1  . NONFORMULARY OR COMPOUNDED ITEM Semaglutide  5mg  and cyancobalamin 0.5ml Take 1ml once a week #37ml 1 each 0  . nystatin  cream (MYCOSTATIN ) Apply 1 Application topically 2 (two) times daily. 30 g 02  . ondansetron  (ZOFRAN -ODT) 8 MG disintegrating tablet 8mg  ODT q4 hours prn nausea 10 tablet 0  . Oxycodone  HCl 20 MG TABS Take 1 tablet (20 mg total) by mouth 4 (four) times daily as needed. 120 tablet 0  . promethazine  (PHENERGAN ) 25 MG suppository Place 1 suppository (25 mg total) rectally every 6 (six) hours as needed for nausea or vomiting. 12  each 0  . Semaglutide , 1 MG/DOSE, 4 MG/3ML SOPN Inject 1 mg as directed once a week. 3 mL 1  . Semaglutide -Weight Management 1.7 MG/0.75ML SOAJ Inject 1.7 mg into the skin once a week. 3 mL 1  . traZODone  (DESYREL ) 100 MG tablet TAKE ONE TABLET BY MOUTH AT BEDTIME AS NEEDED FOR SLEEP 90 tablet 1  .  ALPRAZolam  (XANAX ) 0.25 MG tablet Take 1 tablet (0.25 mg total) by mouth 2 (two) times daily as needed for anxiety. 30 tablet 1   No facility-administered medications prior to visit.    Allergies  Allergen Reactions  . Codeine Nausea And Vomiting  . Cymbalta [Duloxetine Hcl] Other (See Comments)    Headaches  . Fiorinal [Butalbital-Aspirin-Caffeine] Other (See Comments)    Mental Clarity.  . Wellbutrin  [Bupropion ] Other (See Comments)    dizziness    Review of Systems  Constitutional:  Positive for malaise/fatigue. Negative for fever.  HENT:  Negative for congestion.   Eyes:  Negative for blurred vision.  Respiratory:  Negative for shortness of breath.   Cardiovascular:  Negative for chest pain, palpitations and leg swelling.  Gastrointestinal:  Negative for abdominal pain, blood in stool and nausea.  Genitourinary:  Negative for dysuria and frequency.  Musculoskeletal:  Positive for back pain, joint pain, myalgias and neck pain. Negative for falls.  Skin:  Negative for rash.  Neurological:  Negative for dizziness, loss of consciousness and headaches.  Endo/Heme/Allergies:  Negative for environmental allergies.  Psychiatric/Behavioral:  Positive for depression. The patient is nervous/anxious.       Objective:     Physical Exam Constitutional:      General: She is not in acute distress.    Appearance: Normal appearance. She is well-developed. She is not toxic-appearing.  HENT:     Head: Normocephalic and atraumatic.     Right Ear: External ear normal.     Left Ear: External ear normal.     Nose: Nose normal.  Eyes:     General:        Right eye: No discharge.        Left  eye: No discharge.     Conjunctiva/sclera: Conjunctivae normal.  Neck:     Thyroid : No thyromegaly.  Cardiovascular:     Rate and Rhythm: Normal rate and regular rhythm.     Heart sounds: Normal heart sounds. No murmur heard. Pulmonary:     Effort: Pulmonary effort is normal. No respiratory distress.     Breath sounds: Normal breath sounds.  Abdominal:     General: Bowel sounds are normal.     Palpations: Abdomen is soft.     Tenderness: There is no abdominal tenderness. There is no guarding.  Musculoskeletal:        General: Normal range of motion.     Cervical back: Neck supple.  Lymphadenopathy:     Cervical: No cervical adenopathy.  Skin:    General: Skin is warm and dry.  Neurological:     Mental Status: She is alert and oriented to person, place, and time.  Psychiatric:        Mood and Affect: Mood normal.        Behavior: Behavior normal.        Thought Content: Thought content normal.        Judgment: Judgment normal.   BP 122/84   Pulse 72   Resp 16   Ht 5\' 6"  (1.676 m)   Wt 215 lb 3.2 oz (97.6 kg)   SpO2 97%   BMI 34.73 kg/m  Wt Readings from Last 3 Encounters:  02/25/24 215 lb 3.2 oz (97.6 kg)  12/28/23 220 lb 9.6 oz (100.1 kg)  12/10/23 217 lb (98.4 kg)    Diabetic Foot Exam - Simple   No data filed    Lab Results  Component Value Date   WBC 9.9 12/28/2023  HGB 14.2 12/28/2023   HCT 42.8 12/28/2023   PLT 219.0 12/28/2023   GLUCOSE 100 (H) 12/28/2023   CHOL 228 (H) 12/28/2023   TRIG 153.0 (H) 12/28/2023   HDL 48.10 12/28/2023   LDLDIRECT 58.0 02/16/2023   LDLCALC 150 (H) 12/28/2023   ALT 11 12/28/2023   AST 10 12/28/2023   NA 136 12/28/2023   K 4.2 12/28/2023   CL 100 12/28/2023   CREATININE 0.61 12/28/2023   BUN 15 12/28/2023   CO2 29 12/28/2023   TSH 0.86 12/28/2023   HGBA1C 5.7 12/28/2023    Lab Results  Component Value Date   TSH 0.86 12/28/2023   Lab Results  Component Value Date   WBC 9.9 12/28/2023   HGB 14.2 12/28/2023    HCT 42.8 12/28/2023   MCV 93.2 12/28/2023   PLT 219.0 12/28/2023   Lab Results  Component Value Date   NA 136 12/28/2023   K 4.2 12/28/2023   CO2 29 12/28/2023   GLUCOSE 100 (H) 12/28/2023   BUN 15 12/28/2023   CREATININE 0.61 12/28/2023   BILITOT 0.3 12/28/2023   ALKPHOS 68 12/28/2023   AST 10 12/28/2023   ALT 11 12/28/2023   PROT 6.4 12/28/2023   ALBUMIN 4.1 12/28/2023   CALCIUM  10.4 12/28/2023   ANIONGAP 17 (H) 12/08/2023   GFR 95.98 12/28/2023   Lab Results  Component Value Date   CHOL 228 (H) 12/28/2023   Lab Results  Component Value Date   HDL 48.10 12/28/2023   Lab Results  Component Value Date   LDLCALC 150 (H) 12/28/2023   Lab Results  Component Value Date   TRIG 153.0 (H) 12/28/2023   Lab Results  Component Value Date   CHOLHDL 5 12/28/2023   Lab Results  Component Value Date   HGBA1C 5.7 12/28/2023       Assessment & Plan:  Essential hypertension Assessment & Plan: Well controlled, no changes to meds. Encouraged heart healthy diet such as the DASH diet and exercise as tolerated.     Chronic gout involving toe of right foot without tophus, unspecified cause Assessment & Plan: Hydrate and monitor    Hyperglycemia Assessment & Plan: hgba1c acceptable, minimize simple carbs. Increase exercise as tolerated.   Hyperlipidemia, unspecified hyperlipidemia type Assessment & Plan: Encourage heart healthy diet such as MIND or DASH diet, increase exercise, avoid trans fats, simple carbohydrates and processed foods, consider a krill or fish or flaxseed oil cap daily.  Tolerating Atorvastatin    Insomnia, unspecified type Assessment & Plan: Encouraged good sleep hygiene such as dark, quiet room. No blue/green glowing lights such as computer screens in bedroom. No alcohol or stimulants in evening. Cut down on caffeine as able. Regular exercise is helpful but not just prior to bed time. Prescription for Trazadone given   Vitamin D   deficiency Assessment & Plan: Supplement and monitor    Pain in both knees, unspecified chronicity -     Ambulatory referral to Physical Therapy  Bilateral wrist pain -     Ambulatory referral to Physical Therapy  High risk medication use -     Drug Monitoring Panel [phone] , Urine  Other orders -     FLUoxetine  HCl; Take 1 tablet (20 mg total) by mouth daily.  Dispense: 30 tablet; Refill: 3 -     ALPRAZolam ; Take 1 tablet (0.25 mg total) by mouth 2 (two) times daily as needed for anxiety.  Dispense: 60 tablet; Refill: 2    Assessment and Plan Assessment &  Plan       Randie Bustle, MD

## 2024-02-26 ENCOUNTER — Other Ambulatory Visit (HOSPITAL_COMMUNITY): Payer: Self-pay

## 2024-02-26 ENCOUNTER — Telehealth: Payer: Self-pay

## 2024-02-26 NOTE — Telephone Encounter (Addendum)
 Pharmacy Patient Advocate Encounter   Received notification from CoverMyMeds that prior authorization for FLUoxetine  HCl 20MG  tablets  is required/requested.   Insurance verification completed.   The patient is insured through [facility] .   Per test claim: PA required; PA started via CoverMyMeds. KEY [id number] . Waiting for clinical questions to populate.   [id number]

## 2024-02-26 NOTE — Assessment & Plan Note (Signed)
 Discussed options. Agrees to start Fluoxetine  20 mg daily, may use Alprazolam  0.25 to 0.5 po bid prn. reassess

## 2024-02-26 NOTE — Assessment & Plan Note (Signed)
 May try NSAIDs and prescription meds as directed and report if symptoms worsen or seek immediate care. Referred to PT

## 2024-02-28 ENCOUNTER — Ambulatory Visit: Payer: Self-pay | Admitting: Family Medicine

## 2024-02-28 LAB — DRUG MONITORING PANEL 376104, URINE
Amphetamines: NEGATIVE ng/mL (ref <500)
Barbiturates: NEGATIVE ng/mL (ref <300)
Benzodiazepines: NEGATIVE ng/mL (ref <100)
Cocaine Metabolite: NEGATIVE ng/mL (ref <150)
Codeine: NEGATIVE ng/mL (ref <50)
Desmethyltramadol: NEGATIVE ng/mL (ref <100)
Hydrocodone: NEGATIVE ng/mL (ref <50)
Hydromorphone: NEGATIVE ng/mL (ref <50)
Morphine: NEGATIVE ng/mL (ref <50)
Norhydrocodone: NEGATIVE ng/mL (ref <50)
Noroxycodone: 4995 ng/mL — ABNORMAL HIGH (ref <50)
Opiates: NEGATIVE ng/mL (ref <100)
Oxycodone: 1618 ng/mL — ABNORMAL HIGH (ref <50)
Oxycodone: POSITIVE ng/mL — AB (ref <100)
Oxymorphone: 429 ng/mL — ABNORMAL HIGH (ref <50)
Tramadol: NEGATIVE ng/mL (ref <100)

## 2024-02-28 LAB — DM TEMPLATE

## 2024-02-29 ENCOUNTER — Other Ambulatory Visit (HOSPITAL_COMMUNITY): Payer: Self-pay

## 2024-03-02 NOTE — Telephone Encounter (Signed)
 Copied from CRM [id number]. Topic: Clinical - Prescription Issue >> Mar 02, 2024 12:42 PM Baldo Levan wrote: Reason for CRM: [name] from [facility] called in regarding the FLUoxetine  (PROZAC ) 20 MG tablet prior authorization. She is asking if the doctor would consider changing the patient back to capsule instead of the tablet because it would not need a prior authorization. She stated the patient was on the capsule before. If the doctor agrees to this she stated she does not need a call back it can just be sent to the pharmacy. If any questions please contact [name] at [phone]

## 2024-03-02 NOTE — Telephone Encounter (Signed)
 PLEASE BE ADVISED Clinical questions have been answered and PA submitted.TO PLAN. PA currently Pending.

## 2024-03-02 NOTE — Telephone Encounter (Signed)
 Pharmacy Patient Advocate Encounter  Received notification from [facility] that Prior Authorization for FLUoxetine  HCl 20MG  tablets  has been DENIED.  No reason given; No denial letter received via Fax or CMM. It has been requested and will be uploaded to the media tab once received.   PA #/Case ID/Reference #: [id number]

## 2024-03-07 ENCOUNTER — Encounter: Payer: Self-pay | Admitting: Family Medicine

## 2024-03-07 ENCOUNTER — Other Ambulatory Visit: Payer: Self-pay | Admitting: Family Medicine

## 2024-03-07 MED ORDER — OXYCODONE HCL 20 MG PO TABS: 1.0000 | ORAL_TABLET | Freq: Four times a day (QID) | ORAL | 0 refills | Status: DC | PRN

## 2024-03-07 NOTE — Telephone Encounter (Signed)
 Requesting: oxycodone  20mg   Contract: 03/10/23 UDS: 02/25/24 Last Visit: 02/25/24 Next Visit: 05/24/24 Last Refill: 02/02/24 #120 and 0RF   Please Advise

## 2024-03-07 NOTE — Telephone Encounter (Unsigned)
 Copied from CRM [id number]. Topic: Clinical - Medication Refill >> Mar 07, 2024  2:41 PM [place] E wrote: Medication: Oxycodone  HCl 20 MG TABS   Has the patient contacted their pharmacy? Yes (Agent: If no, request that the patient contact the pharmacy for the refill. If patient does not wish to contact the pharmacy document the reason why and proceed with request.) (Agent: If yes, when and what did the pharmacy advise?)  This is the patient's preferred pharmacy:  [facility] [address] - [place], [place] - [address], [address] [place] [address] Phone: [phone] Fax: [phone]  Is this the correct pharmacy for this prescription? Yes If no, delete pharmacy and type the correct one.   Has the prescription been filled recently? Yes  Is the patient out of the medication? No, will be out tomorrow .  Has the patient been seen for an appointment in the last year OR does the patient have an upcoming appointment? Yes  Can we respond through MyChart? Yes  Agent: Please be advised that Rx refills may take up to 3 business days. We ask that you follow-up with your pharmacy.

## 2024-03-08 ENCOUNTER — Other Ambulatory Visit: Payer: Self-pay | Admitting: Family Medicine

## 2024-03-08 MED ORDER — OXYCODONE HCL 20 MG PO TABS: 1.0000 | ORAL_TABLET | Freq: Four times a day (QID) | ORAL | 0 refills | Status: DC | PRN

## 2024-03-08 MED ORDER — FLUOXETINE HCL 20 MG PO CAPS: 20.0000 mg | ORAL_CAPSULE | Freq: Every day | ORAL | 3 refills | Status: DC

## 2024-03-21 DIAGNOSIS — K136 Irritative hyperplasia of oral mucosa: Secondary | ICD-10-CM | POA: Diagnosis not present

## 2024-03-26 ENCOUNTER — Other Ambulatory Visit: Payer: Self-pay | Admitting: Family Medicine

## 2024-03-28 NOTE — Addendum Note (Signed)
 Encounter addended by: Deoni Cosey R on: 03/28/2024 10:32 AM  Actions taken: Imaging Exam ended, Flowsheet accepted

## 2024-03-28 NOTE — Therapy (Addendum)
 OUTPATIENT PHYSICAL THERAPY EVALUATION/DISCHARGE SUMMARY   Patient Name: [name] [name] MRN: [id number] DOB:10-22-1960, 63 y.o., female Today's Date: 04/04/2024  END OF SESSION:  PT End of Session - 04/04/24 1400     Visit Number 1    Date for PT Re-Evaluation 06/27/24    Authorization Type [employer]    Authorization Time Period No prior auth until after V# 27    PT Start Time 1400    PT Stop Time 1514    PT Time Calculation (min) 74 min    Activity Tolerance Patient tolerated treatment well    Behavior During Therapy WFL for tasks assessed/performed          Past Medical History:  Diagnosis Date   Acute foot pain, right 01/13/2017   Acute upper respiratory infections of unspecified site 06/12/2014   Anxiety and depression    chronic   Breast cancer screening 09/05/2016   Cervical cancer screening 09/05/2016   Chronic back pain    Depression with anxiety 08/23/2008   Qualifier: Diagnosis of  By: Viviann Raddle MD, Willie R    Gout 05/05/2017   History of colon polyps    History of fibromyalgia    suspected    Hypertension    Insomnia    Knee pain, right 06/24/2015   Meningitis spinal    Morbid obesity (HCC)    Ovarian cyst, bilateral    Preventative health care 06/24/2015   PTSD (post-traumatic stress disorder)    Vitamin D  deficiency 06/24/2015   Past Surgical History:  Procedure Laterality Date   COLONOSCOPY     OVARIAN CYST REMOVAL     Patient Active Problem List   Diagnosis Date Noted   Left knee pain 02/16/2023   Polyp of colon 02/16/2023   FH: colon cancer 02/16/2023   Post-menopausal 08/18/2022   Candida infection 08/18/2022   Facet arthropathy, lumbosacral 12/12/2020   Pain in both wrists 12/28/2019   Neck pain 12/28/2019   H/O colonoscopy with polypectomy 08/10/2018   Hyperparathyroidism (HCC) 10/11/2017   Hyperglycemia 10/11/2017   Gout 05/05/2017   Left foot pain 01/13/2017   Breast cancer screening 09/05/2016   Cervical cancer screening  09/05/2016   Vitamin D  deficiency 06/24/2015   Knee pain, right 06/24/2015   Preventative health care 06/24/2015   ADD (attention deficit disorder) 03/06/2014   Goiter 11/08/2011   Hypercalcemia 11/08/2011   PTSD (post-traumatic stress disorder) 09/08/2011   Abnormal liver function tests 09/08/2011   MYALGIA 10/18/2010   Obesity 07/01/2010   Constipation 07/01/2010   MENOPAUSE, EARLY 04/30/2010   Essential hypertension 05/24/2009   Arthropathy, lower leg 05/24/2009   MUSCLE SPASM, BACK 05/24/2009   Disorder of salivary gland 01/18/2009   PALPITATIONS, OCCASIONAL 12/18/2008   EDEMA 12/18/2008   Hyperlipidemia 11/28/2008   Atypical chest pain 11/28/2008   Hypothyroidism 10/12/2008   Depression with anxiety 08/23/2008   Lumbar radiculopathy 03/09/2008   Insomnia 03/09/2008    PCP: Domenica Harlene LABOR, MD   REFERRING PROVIDER: Domenica Harlene LABOR, MD   REFERRING DIAG:  [phone] (ICD-10-CM) - Pain in both knees, unspecified chronicity  M25.531,M25.532 (ICD-10-CM) - Bilateral wrist pain   THERAPY DIAG:  Chronic pain of both knees  Muscle weakness (generalized)  Difficulty in walking, not elsewhere classified  Pain in left wrist  RATIONALE FOR EVALUATION AND TREATMENT: Rehabilitation  ONSET DATE: Up to 3 years for knee pain; Intermittent wrist pain for ~5 yrs, hand pain for <1 yr  NEXT MD VISIT: 05/24/2024   SUBJECTIVE:  SUBJECTIVE STATEMENT: Pt reports she was recently diagnosed with lupus but waiting to meet with rheumatologist to confirm diagnosis.  She reports her symptoms vary - 2 weeks ago she had difficultly with walking and going up and down stairs but better currently.  When knees are bad, she uses a cane as she feels like her legs might buckle. Prescription pain meds are 1 for her back and uses topical pain relief for knees.  When her wrists hurt, it typically bothers her near her thumb and bothers her when she tries to drive her car.  PAIN: Are you having  pain? No and Yes: NPRS scale: 0/10 currently, up to 6-7/10  Pain location: B knees - subpatellar  Pain description: sharp, pinching  Aggravating factors: walking, climbing steps  Relieving factors: horse liniment, prescription pain meds, CBD cream    Are you having pain? No and Yes: NPRS scale: 0/10 currently, up to 6-7/10  Pain location: L wrist and thumb  Pain description: dull, constant ache Aggravating factors: uncertain  Relieving factors: wrist brace, horse liniment, CBD cream    PERTINENT HISTORY: Chronic back pain, lumbar radiculopathy, depression with anxiety, gout, HTN, insomnia, morbid obesity, PTSD, ADD, lupus  PRECAUTIONS: None  HAND DOMINANCE: Right  RED FLAGS: None  WEIGHT BEARING RESTRICTIONS: No  FALLS:  Has patient fallen in last 6 months? No  LIVING ENVIRONMENT: Lives with: best friend  Lives in: House/apartment Stairs: Yes: External: 5 steps; bilateral but cannot reach both Has following equipment at home: Single point cane, shower chair, and Grab bars  OCCUPATION: Work from home ~20 hrs/wk - [employer]  PLOF: Independent, Needs assistance with homemaking, and Leisure: used to walk a lot but has been afraid to go out recently    PATIENT GOALS: To be able to walk at least 1 mile 5x/wk w/o pain.   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; random numbness and tingling in B hands   EDEMA:  Variable edema/swelling in knees more so than hands  POSTURE:  rounded shoulders, forward head, and knees slightly flexed in stance  CERVICAL ROM:   Active ROM Eval  Flexion Chin to chest  Extension 35  Right lateral  flexion 24  Left lateral flexion 19  Right rotation 44  Left rotation 38   (Blank rows = not tested)  UPPER EXTREMITY ROM:   Active ROM Right eval Left eval  Shoulder flexion 118 127  Shoulder extension    Shoulder abduction 118 118  Shoulder adduction    Shoulder internal rotation FIR WNL FIR WNL  Shoulder external rotation FER WFL FER WFL  Elbow flexion    Elbow extension    Wrist flexion    Wrist extension    Wrist ulnar deviation    Wrist radial deviation    Wrist pronation    Wrist supination    (Blank rows = not tested)  UPPER EXTREMITY MMT:  MMT Right eval Left eval  Shoulder flexion 4 4+  Shoulder extension 4 4  Shoulder abduction 4 4+  Shoulder adduction    Shoulder internal rotation 4+ 4+  Shoulder external rotation 4 4  Middle trapezius  Lower trapezius    Elbow flexion 5 5  Elbow extension 4+ 4+  Wrist flexion 4+ 4+  Wrist extension 4+ 4+  Wrist ulnar deviation    Wrist radial deviation    Wrist pronation    Wrist supination    Grip strength (lbs) 53.33# 41.67#  (Blank rows = not tested)  MUSCLE LENGTH: Hamstrings: mild tight R>L ITB: mild tight R>L Piriformis: mod tight L>R Hip flexors: mod tight B Quads: mod tight R>L Heelcord: mild/mod tight B  LOWER EXTREMITY ROM:  A/PROM Right eval Left eval  Knee flexion 122 125  Knee extension 7 / 2 11 / 3   (Blank rows = not tested)  LOWER EXTREMITY MMT:  MMT Right eval Left eval  Hip flexion 4 4  Hip extension 3- 3-  Hip abduction 4 4  Hip adduction 4- 4-  Hip internal rotation 4- 4-  Hip external rotation 3+ 3+  Knee flexion 4 4-  Knee extension 4 4  Ankle dorsiflexion 4- 4-  Ankle plantarflexion 3+ (5 SLS HR) 3+ (4 SLS HR)  Ankle inversion    Ankle eversion     (Blank rows = not tested)    TODAY'S TREATMENT:  04/04/2024  SELF CARE:  Reviewed eval findings and role of PT in addressing identified deficits as well as instruction in initial HEP (see below).    PATIENT EDUCATION:   Education details: PT eval findings, anticipated POC, and initial HEP  Person educated: Patient Education method: Explanation, Demonstration, Verbal cues, Handouts, and MedBridgeGO app access provided Education comprehension: verbalized understanding, returned demonstration, verbal cues required, and needs further education  HOME EXERCISE PROGRAM: Access Code: [email or web address] URL: [email or web address] Date: 04/04/2024 Prepared by: Elijah Hidden  Exercises - Standing Gastroc Stretch on Step  - 2 x daily - 7 x weekly - 3 reps - 30 sec hold - Hip Flexor Stretch with Strap on Table  - 2 x daily - 7 x weekly - 3 reps - 30 sec hold - Quad Setting and Stretching  - 1 x daily - 7 x weekly - 2 sets - 10 reps - 3-5 sec hold - Bridge  - 1 x daily - 7 x weekly - 2 sets - 10 reps - 5 sec hold   ASSESSMENT:  CLINICAL IMPRESSION: [name] [name] is a 63 y.o. female who was referred to physical therapy for evaluation and treatment for B knee pain and B wrist pain, weakness.  Patient reports onset of B knee pain beginning up to 3 years ago. Pain is intermittent in nature, worse with walking and climbing stairs, and potentially associated with flares of her recently diagnosed lupus.  L wrist and hand pain has been less of an issue lately but is also chronic in nature.  Patient has deficits in cervical, B shoulder, and B knee ROM, proximal LE flexibility, B LE strength, and abnormal posture which are interfering with ADLs and are impacting quality of life.  On QuickDASH patient scored 22.7/100 demonstrating 22.7% disability.  On LEFS patient scored 51/80 demonstrating mild to moderate functional limitation.  Jayln Branscom will benefit from skilled PT to address above deficits to improve mobility and activity tolerance with decreased pain interference.  OBJECTIVE IMPAIRMENTS: Abnormal gait, decreased activity tolerance, decreased balance, decreased endurance, decreased knowledge of condition, decreased  knowledge of use of DME, decreased mobility, difficulty walking, decreased ROM, decreased strength, increased fascial restrictions, impaired perceived functional ability, impaired flexibility, impaired UE functional use, improper body mechanics, postural dysfunction, and pain.  ACTIVITY LIMITATIONS: standing, squatting, stairs, transfers, locomotion level, and caring for others  PARTICIPATION LIMITATIONS: meal prep, cleaning, laundry, driving, shopping, community activity, and yard work  PERSONAL FACTORS: Fitness, Past/current experiences, Profession, Time since onset of injury/illness/exacerbation, and 3+ comorbidities: Chronic back pain, lumbar radiculopathy, depression with anxiety, gout, HTN, insomnia, morbid obesity, PTSD, ADD, lupus are also affecting patient's functional outcome.   REHAB POTENTIAL: Good  CLINICAL DECISION MAKING: Evolving/moderate complexity  EVALUATION COMPLEXITY: Moderate   GOALS: Goals reviewed with patient? Yes  SHORT TERM GOALS: Target date: 05/16/2024  Patient will be independent with initial HEP. Baseline:  Goal status: INITIAL  2.  Patient will report at least 25% improvement in B knee pain to improve QOL. Baseline: B knee pain up to 6-7/10 Goal status: INITIAL  3.  Patient will demonstrate improved B knee extension AROM to 0 to improve knee alignment in stance and during gait. Baseline: Refer to above LE ROM table Goal status: INITIAL  LONG TERM GOALS: Target date: 06/27/2024  Patient will be independent with advanced/ongoing HEP to improve outcomes and carryover.  Baseline:  Goal status: INITIAL  2.  Patient will report at least 50-75% improvement in B knee pain to improve QOL. Baseline: B knee pain up to 6-7/10 Goal status: INITIAL  3.  Patient to demonstrate ability to achieve and maintain good spinal alignment/posturing and body mechanics needed for daily activities. Baseline:  Goal status: INITIAL  4.  Patient will demonstrate  improved B knee AROM to [facility] to allow for normal gait and stair mechanics. Baseline: Refer to above LE ROM table Goal status: INITIAL  5.  Patient will demonstrate improved B UE strength to >/= 4+/5 for functional UE use. Baseline: Refer to above UE MMT table Goal status: INITIAL  6.  Patient will demonstrate improved LE strength strength to >/= 4 to 4+/5 for improved stability and ease of mobility. Baseline: Refer to above LE MMT table Goal status: INITIAL  7.  Patient will be able to ambulate w/o AD and normal gait pattern without increased pain to access community.  Baseline: Intermittently has to use cane due to knee pain Goal status: INITIAL  8. Patient will be able to ascend/descend stairs with 1 HR and reciprocal step pattern safely to access home and community.  Baseline:  Goal status: INITIAL  9.  Patient will report 60/80 on LEFS (patient reported outcome measure) to demonstrate improved functional ability. Baseline: 51 / 80 = 63.7 % Goal status: INITIAL  10.  Patient will report 8% on QuickDASH (patient reported outcome measure) to demonstrate improved functional ability. Baseline: 22.7 / 100 = 22.7 % Goal status: INITIAL   PLAN:  PT FREQUENCY: 1-2x/week  PT DURATION: 12 weeks  PLANNED INTERVENTIONS: 97164- PT Re-evaluation, 97750- Physical Performance Testing, 97110-Therapeutic exercises, 97530- Therapeutic activity, [id number]- Neuromuscular re-education, 97535- Self Care, 02859- Manual therapy, [phone]- Gait training, [phone]- Aquatic Therapy, [phone]- Electrical stimulation (unattended), 97016- Vasopneumatic device, [id number]- Ionotophoresis 4mg /ml Dexamethasone, 79439 (1-2 muscles), 20561 (3+ muscles)- Dry Needling, Patient/Family education, Balance training, Stair training, Taping, Joint mobilization, Spinal mobilization, Cryotherapy, and Moist heat  PLAN FOR NEXT SESSION: Review initial HEP; Progress LE flexibility and core/LE strengthening, updating HEP accordingly; Initiate  exercises for L wrist/hand as indicated   Elijah CHRISTELLA Hidden, PT 04/04/2024, 3:27 PM   PHYSICAL THERAPY DISCHARGE SUMMARY  Visits from Start of Care: 1  Current functional level related to goals / functional outcomes: Refer to above clinical impression and goal assessment for status as of eval visit  on 04/04/2024. Patient canceled all follow-up visits due to family concerns and did not return to PT in > 30 days, therefore will proceed with discharge from PT for this episode.     Remaining deficits: As above. Unable to formally assess status at discharge due to failure to return to PT.    Education / Equipment: HEP  Patient agrees to discharge. Patient goals were not met. Patient is being discharged due to not returning since the last visit.  Elijah EMERSON Hidden, PT 08/31/2024, 4:11 PM  [facility] [address]  [address] [place], [place], [address] Phone: [phone]   Fax:  [phone]

## 2024-04-04 ENCOUNTER — Ambulatory Visit: Attending: Family Medicine | Admitting: Physical Therapy

## 2024-04-04 ENCOUNTER — Other Ambulatory Visit: Payer: Self-pay

## 2024-04-04 ENCOUNTER — Encounter: Payer: Self-pay | Admitting: Physical Therapy

## 2024-04-04 DIAGNOSIS — M25562 Pain in left knee: Secondary | ICD-10-CM | POA: Insufficient documentation

## 2024-04-04 DIAGNOSIS — M25532 Pain in left wrist: Secondary | ICD-10-CM | POA: Diagnosis not present

## 2024-04-04 DIAGNOSIS — M6281 Muscle weakness (generalized): Secondary | ICD-10-CM | POA: Insufficient documentation

## 2024-04-04 DIAGNOSIS — R262 Difficulty in walking, not elsewhere classified: Secondary | ICD-10-CM | POA: Diagnosis not present

## 2024-04-04 DIAGNOSIS — M25531 Pain in right wrist: Secondary | ICD-10-CM | POA: Insufficient documentation

## 2024-04-04 DIAGNOSIS — G8929 Other chronic pain: Secondary | ICD-10-CM | POA: Insufficient documentation

## 2024-04-04 DIAGNOSIS — M25561 Pain in right knee: Secondary | ICD-10-CM | POA: Diagnosis not present

## 2024-04-05 ENCOUNTER — Other Ambulatory Visit: Payer: Self-pay | Admitting: Family Medicine

## 2024-04-05 ENCOUNTER — Encounter: Payer: Self-pay | Admitting: Family Medicine

## 2024-04-05 MED ORDER — OXYCODONE HCL 20 MG PO TABS: 1.0000 | ORAL_TABLET | Freq: Four times a day (QID) | ORAL | 0 refills | Status: DC | PRN

## 2024-04-05 NOTE — Telephone Encounter (Signed)
 Copied from CRM [id number]. Topic: Clinical - Prescription Issue >> Apr 05, 2024  1:20 PM Rosina BIRCH wrote: Reason for CRM: patient called stating the pharmacy will not fill her oxycodone  because she need a prior authorization approved by her [employer]. The pharmacy is only giving the patient five pills and she states she will lose the rest of the month. Patient states she will be without medication until it gets worked out. CB [phone]

## 2024-04-05 NOTE — Telephone Encounter (Signed)
 Patient was advised in her MyChart message she send.   Copied from CRM [id number]. Topic: General - Other >> Apr 05, 2024  3:39 PM Macario HERO wrote: Reason for CRM: Patient requesting a call back from Dr. Elisabeth nurse regarding medication refill for: Oxycodone  HCl 20 MG TABS [[id number]].

## 2024-04-06 ENCOUNTER — Ambulatory Visit: Payer: Self-pay | Admitting: Family Medicine

## 2024-04-06 ENCOUNTER — Encounter: Payer: Self-pay | Admitting: Family Medicine

## 2024-04-06 ENCOUNTER — Telehealth: Payer: Self-pay

## 2024-04-06 ENCOUNTER — Other Ambulatory Visit (HOSPITAL_COMMUNITY): Payer: Self-pay

## 2024-04-06 NOTE — Telephone Encounter (Signed)
 Copied from CRM [id number]. Topic: Clinical - Medication Question >> Apr 06, 2024 12:22 PM Shereese L wrote: Reason for CRM: Oxycodone  HCl 20 MG TABS Patient stated that the pharmacy is willing to give her a 5 day supply until prior auth is approved but the doctor would have to send  in a new script for Oxycodone  HCl 20 MG TABS in order for the pharmacy to refill the script after that 5 day supply is over

## 2024-04-06 NOTE — Telephone Encounter (Signed)
 Pharmacy Patient Advocate Encounter   Received notification from Patient Advice Request messages that prior authorization for Oxycodone  20mg  tabs is required/requested.   Insurance verification completed.   The patient is insured through [facility] .   Per test claim: PA required; PA submitted to above mentioned insurance via CoverMyMeds Key/confirmation #/EOC [facility] Status is pending

## 2024-04-06 NOTE — Telephone Encounter (Signed)
 error

## 2024-04-07 ENCOUNTER — Other Ambulatory Visit (HOSPITAL_COMMUNITY): Payer: Self-pay

## 2024-04-07 NOTE — Telephone Encounter (Signed)
 Pharmacy Patient Advocate Encounter  Received notification from [facility] that Prior Authorization for Oxycodone  20mg  tabs has been APPROVED from 04/06/24 to 10/07/24   PA #/Case ID/Reference #: [id number]  Per the patient message, she picked up a 5 day supply on 04/06/24.

## 2024-04-08 ENCOUNTER — Other Ambulatory Visit: Payer: Self-pay | Admitting: Family Medicine

## 2024-04-08 MED ORDER — OXYCODONE HCL 20 MG PO TABS: 1.0000 | ORAL_TABLET | Freq: Four times a day (QID) | ORAL | 0 refills | Status: DC | PRN

## 2024-04-11 ENCOUNTER — Ambulatory Visit

## 2024-04-18 ENCOUNTER — Encounter: Admitting: Physical Therapy

## 2024-04-25 ENCOUNTER — Encounter

## 2024-05-02 ENCOUNTER — Encounter: Admitting: Physical Therapy

## 2024-05-06 ENCOUNTER — Other Ambulatory Visit: Payer: Self-pay | Admitting: Family Medicine

## 2024-05-06 ENCOUNTER — Telehealth: Payer: Self-pay | Admitting: Family Medicine

## 2024-05-06 MED ORDER — OXYCODONE HCL 20 MG PO TABS: 1.0000 | ORAL_TABLET | Freq: Four times a day (QID) | ORAL | 0 refills | Status: DC | PRN

## 2024-05-06 NOTE — Telephone Encounter (Signed)
 Copied from CRM [id number]. Topic: Clinical - Medication Refill >> May 06, 2024  4:15 PM Shereese L wrote: Medication: Oxycodone  HCl 20 MG TABS  Has the patient contacted their pharmacy? Yes (Agent: If no, request that the patient contact the pharmacy for the refill. If patient does not wish to contact the pharmacy document the reason why and proceed with request.) (Agent: If yes, when and what did the pharmacy advise?)  This is the patient's preferred pharmacy:  [facility] [address] - [place], [place] - [address] [place] [address] Phone: [phone] Fax: [phone]   Is this the correct pharmacy for this prescription? Yes If no, delete pharmacy and type the correct one.   Has the prescription been filled recently? Yes  Is the patient out of the medication? Yes  Has the patient been seen for an appointment in the last year OR does the patient have an upcoming appointment? Yes  Can we respond through MyChart? Yes  Agent: Please be advised that Rx refills may take up to 3 business days. We ask that you follow-up with your pharmacy.

## 2024-05-06 NOTE — Telephone Encounter (Unsigned)
 Copied from CRM [id number]. Topic: Clinical - Medication Refill >> May 06, 2024  4:15 PM Shereese L wrote: Medication: Oxycodone  HCl 20 MG TABS  Has the patient contacted their pharmacy? Yes (Agent: If no, request that the patient contact the pharmacy for the refill. If patient does not wish to contact the pharmacy document the reason why and proceed with request.) (Agent: If yes, when and what did the pharmacy advise?)  This is the patient's preferred pharmacy:  [facility] [address] - [place], [place] - [address] [place] [address] Phone: [phone] Fax: [phone]   Is this the correct pharmacy for this prescription? Yes If no, delete pharmacy and type the correct one.   Has the prescription been filled recently? Yes  Is the patient out of the medication? Yes  Has the patient been seen for an appointment in the last year OR does the patient have an upcoming appointment? Yes  Can we respond through MyChart? Yes  Agent: Please be advised that Rx refills may take up to 3 business days. We ask that you follow-up with your pharmacy.

## 2024-05-06 NOTE — Telephone Encounter (Signed)
 Copied from CRM [id number]. Topic: Clinical - Medication Refill >> May 06, 2024  4:15 PM Shereese L wrote: Medication: Oxycodone  HCl 20 MG TABS  Has the patient contacted their pharmacy? Yes  This is the patient's preferred pharmacy:  [facility] [address] - [place], [place] - [address] [place] [address] Phone: [phone] Fax: [phone]   Is this the correct pharmacy for this prescription? Yes  Has the prescription been filled recently? Yes  Is the patient out of the medication? Yes  Has the patient been seen for an appointment in the last year OR does the patient have an upcoming appointment? Yes  Can we respond through MyChart? Yes  Agent: Please be advised that Rx refills may take up to 3 business days. We ask that you follow-up with your pharmacy.

## 2024-05-09 ENCOUNTER — Encounter: Payer: Self-pay | Admitting: Family Medicine

## 2024-05-09 ENCOUNTER — Other Ambulatory Visit: Payer: Self-pay | Admitting: Family Medicine

## 2024-05-09 MED ORDER — ALPRAZOLAM 0.25 MG PO TABS: 0.2500 mg | ORAL_TABLET | Freq: Two times a day (BID) | ORAL | 2 refills | Status: DC | PRN

## 2024-05-16 ENCOUNTER — Encounter: Payer: Self-pay | Admitting: Family Medicine

## 2024-05-17 ENCOUNTER — Other Ambulatory Visit: Payer: Self-pay | Admitting: Family Medicine

## 2024-05-17 MED ORDER — FENTANYL 25 MCG/HR TD PT72: 1.0000 | MEDICATED_PATCH | TRANSDERMAL | 0 refills | Status: DC

## 2024-05-18 ENCOUNTER — Encounter: Payer: Self-pay | Admitting: Family Medicine

## 2024-05-19 ENCOUNTER — Other Ambulatory Visit (HOSPITAL_COMMUNITY): Payer: Self-pay

## 2024-05-19 ENCOUNTER — Telehealth: Payer: Self-pay

## 2024-05-19 NOTE — Telephone Encounter (Signed)
**Note De-identified  [place] Obfuscation** Please advise 

## 2024-05-19 NOTE — Telephone Encounter (Signed)
 Pharmacy Patient Advocate Encounter   Received notification from CoverMyMeds that prior authorization for fentaNYL  25MCG/HR 72 hr patches  is required/requested.   Insurance verification completed.   The patient is insured through [facility] .    Per test claim: PA required; PA started via CoverMyMeds. KEY [id number].Please see clinical question(s) below that I am not finding the answer to in their chart and advise.   DO NOT SEE WHAT THE PRIMARY DIAGNOSIS FOR FENTANYL  PLEASE ASSIST IN THIS TO PROCEED WITH PA

## 2024-05-20 ENCOUNTER — Other Ambulatory Visit: Payer: Self-pay | Admitting: Family Medicine

## 2024-05-20 NOTE — Telephone Encounter (Signed)
 Pt made aware.

## 2024-05-20 NOTE — Telephone Encounter (Signed)
 Pharmacy Patient Advocate Encounter  Received notification from [facility] that Prior Authorization for fentaNYL  25MCG/HR 72 hr patches  has been APPROVED from 05/20/24 to 08/20/24   PA #/Case ID/Reference #: [id number]

## 2024-05-21 ENCOUNTER — Encounter: Payer: Self-pay | Admitting: Family Medicine

## 2024-05-23 ENCOUNTER — Ambulatory Visit: Admitting: Family Medicine

## 2024-05-23 ENCOUNTER — Encounter: Payer: Self-pay | Admitting: Family Medicine

## 2024-05-23 VITALS — BP 136/54 | HR 55 | Ht 66.0 in | Wt 209.0 lb

## 2024-05-23 DIAGNOSIS — L988 Other specified disorders of the skin and subcutaneous tissue: Secondary | ICD-10-CM | POA: Diagnosis not present

## 2024-05-23 NOTE — Progress Notes (Signed)
   Acute Office Visit  Subjective:     Patient ID: [name] [name], female    DOB: June 18, 1961, 63 y.o.   MRN: [id number]  Chief Complaint  Patient presents with   Breast Mass    HPI Patient is in today for breast lump.    Discussed the use of AI scribe software for clinical note transcription with the patient, who gave verbal consent to proceed.  History of Present Illness [name] is a 63 year old female who presents with a breast lump.  On Saturday, she discovered a lump in her breast while scratching. The lump felt warm and appeared red with a darker center, with a pinkish color radiating outward. By Monday, the pink radiating color was no longer present, but the area remained red and warm. The lump is tender but there is no fever, chills, or body aches. The lump is located on the bottom of the left breast, which makes it difficult to assess due to natural warmth in that area.  She had a mammogram in April which was normal. She has not noticed any drainage from the lump, and it does not feel as hot as it initially did. The lump felt harder on Saturday and appeared swollen and bright red in a picture she took at that time.  She is currently taking lisinopril  and metoprolol  but had not taken her medication on the day of the visit, which she attributes to her elevated blood pressure reading.        ROS All review of systems negative except what is listed in the HPI      Objective:    BP (!) 136/54   Pulse (!) 55   Ht 5' 6 (1.676 m)   Wt 209 lb (94.8 kg)   SpO2 96%   BMI 33.73 kg/m    Physical Exam Vitals reviewed.  Constitutional:      Appearance: Normal appearance.  Neurological:     Mental Status: She is alert and oriented to person, place, and time.  Psychiatric:        Mood and Affect: Mood normal.        Behavior: Behavior normal.        Thought Content: Thought content normal.        Judgment: Judgment normal.    L breast 5:00, slight  bruising, no induration, erythema, or heat         No results found for any visits on 05/23/24.      Assessment & Plan:   Problem List Items Addressed This Visit   None Visit Diagnoses       Lesion of skin of breast    -  Primary     Likely a self-resolving abscess. Looks significantly better compared to picture she sent us . No more swelling, erythema, warmth, induration. No palpable masses. No current signs of infection. Recommend warm compresses and monitoring for the next week. Last mammo was normal in April 2025.  If not continuing to improve or new symptoms develop - message me and we will order mammo/US .   Patient aware of signs/symptoms requiring further/urgent evaluation.     No orders of the defined types were placed in this encounter.   Return if symptoms worsen or fail to improve.  Waddell KATHEE Mon, NP

## 2024-05-24 ENCOUNTER — Telehealth: Admitting: Family Medicine

## 2024-05-31 ENCOUNTER — Encounter: Payer: Self-pay | Admitting: Family Medicine

## 2024-06-01 ENCOUNTER — Other Ambulatory Visit: Payer: Self-pay | Admitting: Family Medicine

## 2024-06-01 MED ORDER — OXYCODONE HCL 20 MG PO TABS: 1.0000 | ORAL_TABLET | Freq: Four times a day (QID) | ORAL | 0 refills | Status: DC | PRN

## 2024-06-01 NOTE — Telephone Encounter (Signed)
 Requesting: Oxycodone  20 MG Contract: None UDS: 02/25/24 Last Visit: 05/23/2024 Next Visit: 08/29/2024 Last Refill: 05/06/24  Please Advise

## 2024-06-02 DIAGNOSIS — G629 Polyneuropathy, unspecified: Secondary | ICD-10-CM | POA: Diagnosis not present

## 2024-06-02 DIAGNOSIS — M19041 Primary osteoarthritis, right hand: Secondary | ICD-10-CM | POA: Diagnosis not present

## 2024-06-02 DIAGNOSIS — R768 Other specified abnormal immunological findings in serum: Secondary | ICD-10-CM | POA: Diagnosis not present

## 2024-06-02 DIAGNOSIS — G8929 Other chronic pain: Secondary | ICD-10-CM | POA: Diagnosis not present

## 2024-06-02 DIAGNOSIS — M170 Bilateral primary osteoarthritis of knee: Secondary | ICD-10-CM | POA: Diagnosis not present

## 2024-06-02 DIAGNOSIS — M25561 Pain in right knee: Secondary | ICD-10-CM | POA: Diagnosis not present

## 2024-06-02 DIAGNOSIS — Z79899 Other long term (current) drug therapy: Secondary | ICD-10-CM | POA: Diagnosis not present

## 2024-06-02 DIAGNOSIS — M19042 Primary osteoarthritis, left hand: Secondary | ICD-10-CM | POA: Diagnosis not present

## 2024-06-02 DIAGNOSIS — M109 Gout, unspecified: Secondary | ICD-10-CM | POA: Diagnosis not present

## 2024-06-02 DIAGNOSIS — M25562 Pain in left knee: Secondary | ICD-10-CM | POA: Diagnosis not present

## 2024-06-06 ENCOUNTER — Telehealth: Payer: Self-pay | Admitting: Family Medicine

## 2024-06-06 NOTE — Telephone Encounter (Signed)
 Pt scheduled for 10/9 at 1120am

## 2024-06-06 NOTE — Telephone Encounter (Unsigned)
 Copied from CRM [id number]. Topic: General - Other >> Jun 06, 2024 11:37 AM Gennette ORN wrote: Reason for CRM: Patient is stating she is suppose to have a follow up on 06/07/24. But nothing is here. She wants Dr. Domenica to follow up.

## 2024-06-24 DIAGNOSIS — M79674 Pain in right toe(s): Secondary | ICD-10-CM | POA: Diagnosis not present

## 2024-06-24 DIAGNOSIS — B351 Tinea unguium: Secondary | ICD-10-CM | POA: Diagnosis not present

## 2024-06-25 ENCOUNTER — Encounter: Payer: Self-pay | Admitting: Family Medicine

## 2024-06-27 MED ORDER — OXYCODONE HCL 20 MG PO TABS: 1.0000 | ORAL_TABLET | Freq: Four times a day (QID) | ORAL | 0 refills | Status: DC | PRN

## 2024-06-27 MED ORDER — FENTANYL 25 MCG/HR TD PT72: 1.0000 | MEDICATED_PATCH | TRANSDERMAL | 0 refills | Status: DC

## 2024-06-27 NOTE — Telephone Encounter (Signed)
 Requesting: Fentanyl  25 MCG patch Contract: None UDS: 02/25/24 Last Visit: 02/25/2024 Next Visit: 07/07/2024 Last Refill: 05/17/2024  Please Advise  Requesting: Oxycodone  20 MG Contract: None UDS: 02/25/24 Last Visit: 02/25/2024 Next Visit: 07/07/2024 Last Refill: 06/01/2024  Please Advise

## 2024-07-03 NOTE — Assessment & Plan Note (Signed)
 Supplement and monitor

## 2024-07-03 NOTE — Assessment & Plan Note (Signed)
Tolerating Adderall and continue same

## 2024-07-03 NOTE — Assessment & Plan Note (Signed)
 Encouraged moist heat and gentle stretching as tolerated. May try NSAIDs and prescription meds as directed and report if symptoms worsen or seek immediate care

## 2024-07-03 NOTE — Assessment & Plan Note (Signed)
 Encourage heart healthy diet such as MIND or DASH diet, increase exercise, avoid trans fats, simple carbohydrates and processed foods, consider a krill or fish or flaxseed oil cap daily. Tolerating Atorvastatin

## 2024-07-03 NOTE — Assessment & Plan Note (Signed)
 hgba1c acceptable, minimize simple carbs. Increase exercise as tolerated.

## 2024-07-03 NOTE — Assessment & Plan Note (Signed)
 On Levothyroxine, continue to monitor

## 2024-07-03 NOTE — Assessment & Plan Note (Signed)
 Well controlled, no changes to meds. Encouraged heart healthy diet such as the DASH diet and exercise as tolerated.

## 2024-07-04 ENCOUNTER — Other Ambulatory Visit: Payer: Self-pay | Admitting: Family

## 2024-07-07 ENCOUNTER — Encounter: Payer: Self-pay | Admitting: Family Medicine

## 2024-07-07 ENCOUNTER — Other Ambulatory Visit (HOSPITAL_BASED_OUTPATIENT_CLINIC_OR_DEPARTMENT_OTHER): Payer: Self-pay

## 2024-07-07 ENCOUNTER — Ambulatory Visit: Admitting: Family Medicine

## 2024-07-07 VITALS — BP 112/66 | HR 53 | Ht 66.0 in | Wt 205.4 lb

## 2024-07-07 DIAGNOSIS — Z23 Encounter for immunization: Secondary | ICD-10-CM | POA: Diagnosis not present

## 2024-07-07 DIAGNOSIS — F909 Attention-deficit hyperactivity disorder, unspecified type: Secondary | ICD-10-CM | POA: Diagnosis not present

## 2024-07-07 DIAGNOSIS — E039 Hypothyroidism, unspecified: Secondary | ICD-10-CM | POA: Diagnosis not present

## 2024-07-07 DIAGNOSIS — I10 Essential (primary) hypertension: Secondary | ICD-10-CM

## 2024-07-07 DIAGNOSIS — E785 Hyperlipidemia, unspecified: Secondary | ICD-10-CM | POA: Diagnosis not present

## 2024-07-07 DIAGNOSIS — M5416 Radiculopathy, lumbar region: Secondary | ICD-10-CM

## 2024-07-07 DIAGNOSIS — E559 Vitamin D deficiency, unspecified: Secondary | ICD-10-CM

## 2024-07-07 DIAGNOSIS — R739 Hyperglycemia, unspecified: Secondary | ICD-10-CM

## 2024-07-07 LAB — CBC WITH DIFFERENTIAL/PLATELET
Basophils Absolute: 0 K/uL (ref 0.0–0.1)
Basophils Relative: 0.2 % (ref 0.0–3.0)
Eosinophils Absolute: 0.2 K/uL (ref 0.0–0.7)
Eosinophils Relative: 2.3 % (ref 0.0–5.0)
HCT: 42.6 % (ref 36.0–46.0)
Hemoglobin: 13.9 g/dL (ref 12.0–15.0)
Lymphocytes Relative: 20.1 % (ref 12.0–46.0)
Lymphs Abs: 1.8 K/uL (ref 0.7–4.0)
MCHC: 32.5 g/dL (ref 30.0–36.0)
MCV: 95.8 fl (ref 78.0–100.0)
Monocytes Absolute: 0.7 K/uL (ref 0.1–1.0)
Monocytes Relative: 7.6 % (ref 3.0–12.0)
Neutro Abs: 6.3 K/uL (ref 1.4–7.7)
Neutrophils Relative %: 69.8 % (ref 43.0–77.0)
Platelets: 216 K/uL (ref 150.0–400.0)
RBC: 4.45 Mil/uL (ref 3.87–5.11)
RDW: 14.7 % (ref 11.5–15.5)
WBC: 9 K/uL (ref 4.0–10.5)

## 2024-07-07 LAB — COMPREHENSIVE METABOLIC PANEL WITH GFR
ALT: 20 U/L (ref 0–35)
AST: 19 U/L (ref 0–37)
Albumin: 4.2 g/dL (ref 3.5–5.2)
Alkaline Phosphatase: 75 U/L (ref 39–117)
BUN: 12 mg/dL (ref 6–23)
CO2: 35 meq/L — ABNORMAL HIGH (ref 19–32)
Calcium: 10.2 mg/dL (ref 8.4–10.5)
Chloride: 99 meq/L (ref 96–112)
Creatinine, Ser: 0.5 mg/dL (ref 0.40–1.20)
GFR: 100.32 mL/min (ref >60.00)
Glucose, Bld: 90 mg/dL (ref 70–99)
Potassium: 4.2 meq/L (ref 3.5–5.1)
Sodium: 140 meq/L (ref 135–145)
Total Bilirubin: 0.4 mg/dL (ref 0.2–1.2)
Total Protein: 6.3 g/dL (ref 6.0–8.3)

## 2024-07-07 LAB — LIPID PANEL
Cholesterol: 136 mg/dL (ref 0–200)
HDL: 54.6 mg/dL (ref >39.00)
LDL Cholesterol: 56 mg/dL (ref 0–99)
NonHDL: 81.03
Total CHOL/HDL Ratio: 2
Triglycerides: 123 mg/dL (ref 0.0–149.0)
VLDL: 24.6 mg/dL (ref 0.0–40.0)

## 2024-07-07 LAB — HEMOGLOBIN A1C: Hgb A1c MFr Bld: 6 % (ref 4.6–6.5)

## 2024-07-07 LAB — TSH: TSH: 1.54 u[IU]/mL (ref 0.35–5.50)

## 2024-07-07 MED ORDER — LISINOPRIL 10 MG PO TABS: 5.0000 mg | ORAL_TABLET | Freq: Every day | ORAL | Status: DC

## 2024-07-07 MED ORDER — COMIRNATY 30 MCG/0.3ML IM SUSY
0.3000 mL | PREFILLED_SYRINGE | Freq: Once | INTRAMUSCULAR | 0 refills | Status: AC
Start: 2024-07-07 — End: 2024-07-08
  Filled 2024-07-07: qty 0.3, 1d supply, fill #0

## 2024-07-07 MED ORDER — FLUOXETINE HCL 40 MG PO CAPS: 40.0000 mg | ORAL_CAPSULE | Freq: Every day | ORAL | 1 refills | Status: AC

## 2024-07-07 NOTE — Progress Notes (Signed)
 Subjective:    Patient ID: [name] [name], female    DOB: July 19, 1961, 63 y.o.   MRN: [id number]  Chief Complaint  Patient presents with   Follow-up    No concerns     HPI Discussed the use of AI scribe software for clinical note transcription with the patient, who gave verbal consent to proceed.  History of Present Illness [name] [name] is a 63 year old female who presents for vaccination updates and management of low blood pressure and anxiety.  She is seeking vaccination updates, specifically mentioning the need for a flu shot, COVID-19 vaccine, and shingles vaccine. She is uncertain about the current recommendations for the COVID-19 vaccine and is interested in receiving the shingles vaccine, noting that she may need to visit a pharmacy for it. She is also curious about the RSV and pneumococcal vaccines, given her age and risk factors.  She experiences low blood pressure, with a recent reading of 100/60 mmHg at a podiatrist visit and 112/66 mmHg today. Her watch has alerted her to low blood pressure without providing specific numbers. She is currently on lisinopril  and metoprolol  for hypertension.  She has been experiencing anxiety and has been on fluoxetine  20 mg for three to four months without noticeable improvement. She reports no adverse effects but also no significant relief from anxiety symptoms. She mentions a past adverse reaction to bupropion , which caused dizziness, and is cautious about trying new medications. She is currently not taking Celebrex, which was prescribed by her rheumatologist, as she did not complete the associated lab work.  No pressing issues aside from low blood pressure and anxiety. Reports good bowel movements and no other significant concerns.    Past Medical History:  Diagnosis Date   Acute foot pain, right 01/13/2017   Acute upper respiratory infections of unspecified site 06/12/2014   Anxiety and depression    chronic   Breast cancer  screening 09/05/2016   Cervical cancer screening 09/05/2016   Chronic back pain    Depression with anxiety 08/23/2008   Qualifier: Diagnosis of  By: Viviann Raddle MD, Marsha SAUNDERS    Gout 05/05/2017   History of colon polyps    History of fibromyalgia    suspected    Hypertension    Insomnia    Knee pain, right 06/24/2015   Meningitis spinal    Morbid obesity (HCC)    Ovarian cyst, bilateral    Preventative health care 06/24/2015   PTSD (post-traumatic stress disorder)    Vitamin D  deficiency 06/24/2015    Past Surgical History:  Procedure Laterality Date   COLONOSCOPY     OVARIAN CYST REMOVAL      Family History  Problem Relation Age of Onset   Colon cancer Mother    Breast cancer Mother        benign   Rheum arthritis Father    Diabetes Father    Congestive Heart Failure Father    Hypertension Father    Stroke Father    Hip fracture Maternal Grandmother    COPD Maternal Grandfather 61   Colon cancer Maternal Grandfather    Lymphoma Paternal Grandmother    Colon cancer Maternal Aunt    Colon cancer Maternal Uncle 83   Breast cancer Cousin        negative stage 3   Breast cancer Maternal Great-grandmother    ADD / ADHD Neg Hx    Depression Neg Hx    Alcohol abuse Neg Hx  Drug abuse Neg Hx    Esophageal cancer Neg Hx     Social History   Socioeconomic History   Marital status: Divorced    Spouse name: Not on file   Number of children: Not on file   Years of education: Not on file   Highest education level: Associate degree: academic program  Occupational History   Not on file  Tobacco Use   Smoking status: Former    Current packs/day: 0.00    Types: Cigarettes    Quit date: 04/04/1989    Years since quitting: 35.2   Smokeless tobacco: Never  Vaping Use   Vaping status: Never Used  Substance and Sexual Activity   Alcohol use: Yes    Comment: occassionally- 2 glassses of wine 2x/month   Drug use: No    Comment: take meds as prescribed   Sexual activity: Not  Currently  Other Topics Concern   Not on file  Social History Narrative   Not on file   Social Drivers of Health   Financial Resource Strain: High Risk (12/25/2023)   Overall Financial Resource Strain (CARDIA)    Difficulty of Paying Living Expenses: Very hard  Food Insecurity: Food Insecurity Present (12/25/2023)   Hunger Vital Sign    Worried About Running Out of Food in the Last Year: Sometimes true    Ran Out of Food in the Last Year: Never true  Transportation Needs: No Transportation Needs (12/25/2023)   PRAPARE - Administrator, Civil Service (Medical): No    Lack of Transportation (Non-Medical): No  Physical Activity: Insufficiently Active (12/25/2023)   Exercise Vital Sign    Days of Exercise per Week: 4 days    Minutes of Exercise per Session: 20 min  Stress: No Stress Concern Present (12/25/2023)   [employer] of Occupational Health - Occupational Stress Questionnaire    Feeling of Stress : Only a little  Social Connections: Moderately Integrated (12/25/2023)   Social Connection and Isolation Panel    Frequency of Communication with Friends and Family: More than three times a week    Frequency of Social Gatherings with Friends and Family: Twice a week    Attends Religious Services: More than 4 times per year    Active Member of [employer] or Organizations: Yes    Attends Engineer, structural: More than 4 times per year    Marital Status: Never married  Intimate Partner Violence: Not on file    Outpatient Medications Prior to Visit  Medication Sig Dispense Refill   allopurinol  (ZYLOPRIM ) 100 MG tablet Take 1 tablet (100 mg total) by mouth 2 (two) times daily. 180 tablet 1   ALPRAZolam  (XANAX ) 0.25 MG tablet Take 1 tablet (0.25 mg total) by mouth 2 (two) times daily as needed for anxiety. 60 tablet 2   atorvastatin  (LIPITOR) 40 MG tablet TAKE ONE TABLET BY MOUTH ONE TIME DAILY 90 tablet 3   beclomethasone (BECONASE-AQ) 42 MCG/SPRAY nasal spray Place 1  spray into both nostrils 2 (two) times daily. Dose is for each nostril. 25 g 1   cholecalciferol (VITAMIN D3) 25 MCG (1000 UNIT) tablet Take 2,000 Units by mouth daily.     colchicine  0.6 MG tablet TAKE 2 TABLETS BY MOUTH ONCE THEN 1 TABLET BY MOUTH EVERY 2 HOURS AS NEEDED PAIN TIL PAIN GONE, MAXIMUM OF 6 TABLETS IN 24 HOURS OR INTOLERABLE DIARRHEA 6 tablet 2   cyclobenzaprine  (FLEXERIL ) 10 MG tablet TAKE ONE-HALF TO ONE TABLET BY MOUTH TWICE  DAILY AS NEEDED FOR MUSCLE SPASM 40 tablet 2   fentaNYL  (DURAGESIC ) 25 MCG/HR Place 1 patch onto the skin every 3 (three) days. 10 patch 0   metoprolol  succinate (TOPROL -XL) 25 MG 24 hr tablet Take 1 tablet (25 mg total) by mouth daily. 90 tablet 1   ondansetron  (ZOFRAN -ODT) 8 MG disintegrating tablet 8mg  ODT q4 hours prn nausea 10 tablet 0   Oxycodone  HCl 20 MG TABS Take 1 tablet (20 mg total) by mouth 4 (four) times daily as needed. 120 tablet 0   traZODone  (DESYREL ) 100 MG tablet Take 1 tablet (100 mg total) by mouth at bedtime as needed for sleep. 90 tablet 1   FLUoxetine  (PROZAC ) 20 MG capsule Take 1 capsule (20 mg total) by mouth daily. 30 capsule 3   lisinopril  (ZESTRIL ) 10 MG tablet TAKE ONE TABLET BY MOUTH ONE TIME DAILY 90 tablet 1   No facility-administered medications prior to visit.    Allergies  Allergen Reactions   Codeine Nausea And Vomiting   Cymbalta [Duloxetine Hcl] Other (See Comments)    Headaches   Fiorinal [Butalbital-Aspirin-Caffeine] Other (See Comments)    Mental Clarity.   Wellbutrin  [Bupropion ] Other (See Comments)    dizziness    Review of Systems  Constitutional:  Positive for malaise/fatigue. Negative for fever.  HENT:  Negative for congestion.   Eyes:  Negative for blurred vision.  Respiratory:  Negative for shortness of breath.   Cardiovascular:  Negative for chest pain, palpitations and leg swelling.  Gastrointestinal:  Positive for blood in stool. Negative for abdominal pain and nausea.  Genitourinary:   Negative for dysuria and frequency.  Musculoskeletal:  Positive for back pain. Negative for falls.  Skin:  Negative for rash.  Neurological:  Positive for dizziness. Negative for loss of consciousness and headaches.  Endo/Heme/Allergies:  Negative for environmental allergies.  Psychiatric/Behavioral:  Positive for depression. The patient is nervous/anxious.        Objective:    Physical Exam Constitutional:      General: She is not in acute distress.    Appearance: Normal appearance. She is well-developed. She is not toxic-appearing.  HENT:     Head: Normocephalic and atraumatic.     Right Ear: External ear normal.     Left Ear: External ear normal.     Nose: Nose normal.  Eyes:     General:        Right eye: No discharge.        Left eye: No discharge.     Conjunctiva/sclera: Conjunctivae normal.  Neck:     Thyroid : No thyromegaly.  Cardiovascular:     Rate and Rhythm: Normal rate and regular rhythm.     Heart sounds: Normal heart sounds. No murmur heard. Pulmonary:     Effort: Pulmonary effort is normal. No respiratory distress.     Breath sounds: Normal breath sounds.  Abdominal:     General: Bowel sounds are normal.     Palpations: Abdomen is soft.     Tenderness: There is no abdominal tenderness. There is no guarding.  Musculoskeletal:        General: Normal range of motion.     Cervical back: Neck supple.  Lymphadenopathy:     Cervical: No cervical adenopathy.  Skin:    General: Skin is warm and dry.  Neurological:     Mental Status: She is alert and oriented to person, place, and time.  Psychiatric:        Mood and Affect: Mood  normal.        Behavior: Behavior normal.        Thought Content: Thought content normal.        Judgment: Judgment normal.     BP 112/66 (BP Location: Left Arm, Patient Position: Sitting, Cuff Size: Large)   Pulse (!) 53   Ht 5' 6 (1.676 m)   Wt 205 lb 6.4 oz (93.2 kg)   SpO2 94%   BMI 33.15 kg/m  Wt Readings from Last 3  Encounters:  07/07/24 205 lb 6.4 oz (93.2 kg)  05/23/24 209 lb (94.8 kg)  02/25/24 215 lb 3.2 oz (97.6 kg)    Diabetic Foot Exam - Simple   No data filed    Lab Results  Component Value Date   WBC 9.9 12/28/2023   HGB 14.2 12/28/2023   HCT 42.8 12/28/2023   PLT 219.0 12/28/2023   GLUCOSE 100 (H) 12/28/2023   CHOL 228 (H) 12/28/2023   TRIG 153.0 (H) 12/28/2023   HDL 48.10 12/28/2023   LDLDIRECT 58.0 02/16/2023   LDLCALC 150 (H) 12/28/2023   ALT 11 12/28/2023   AST 10 12/28/2023   NA 136 12/28/2023   K 4.2 12/28/2023   CL 100 12/28/2023   CREATININE 0.61 12/28/2023   BUN 15 12/28/2023   CO2 29 12/28/2023   TSH 0.86 12/28/2023   HGBA1C 5.7 12/28/2023    Lab Results  Component Value Date   TSH 0.86 12/28/2023   Lab Results  Component Value Date   WBC 9.9 12/28/2023   HGB 14.2 12/28/2023   HCT 42.8 12/28/2023   MCV 93.2 12/28/2023   PLT 219.0 12/28/2023   Lab Results  Component Value Date   NA 136 12/28/2023   K 4.2 12/28/2023   CO2 29 12/28/2023   GLUCOSE 100 (H) 12/28/2023   BUN 15 12/28/2023   CREATININE 0.61 12/28/2023   BILITOT 0.3 12/28/2023   ALKPHOS 68 12/28/2023   AST 10 12/28/2023   ALT 11 12/28/2023   PROT 6.4 12/28/2023   ALBUMIN 4.1 12/28/2023   CALCIUM  10.4 12/28/2023   ANIONGAP 17 (H) 12/08/2023   GFR 95.98 12/28/2023   Lab Results  Component Value Date   CHOL 228 (H) 12/28/2023   Lab Results  Component Value Date   HDL 48.10 12/28/2023   Lab Results  Component Value Date   LDLCALC 150 (H) 12/28/2023   Lab Results  Component Value Date   TRIG 153.0 (H) 12/28/2023   Lab Results  Component Value Date   CHOLHDL 5 12/28/2023   Lab Results  Component Value Date   HGBA1C 5.7 12/28/2023       Assessment & Plan:  Attention deficit hyperactivity disorder (ADHD), unspecified ADHD type Assessment & Plan: Tolerating Adderall and continue same   Essential hypertension Assessment & Plan: Well controlled, no changes to meds.  Encouraged heart healthy diet such as the DASH diet and exercise as tolerated.    Orders: -     Comprehensive metabolic panel with GFR -     CBC with Differential/Platelet -     TSH  Hyperglycemia Assessment & Plan: hgba1c acceptable, minimize simple carbs. Increase exercise as tolerated.  Orders: -     Hemoglobin A1c  Hyperlipidemia, unspecified hyperlipidemia type Assessment & Plan: Encourage heart healthy diet such as MIND or DASH diet, increase exercise, avoid trans fats, simple carbohydrates and processed foods, consider a krill or fish or flaxseed oil cap daily.  Tolerating Atorvastatin   Orders: -  Lipid panel -     Comprehensive metabolic panel with GFR  Hypothyroidism, unspecified type Assessment & Plan: On Levothyroxine, continue to monitor    Vitamin D  deficiency Assessment & Plan: Supplement and monitor   Orders: -     Vitamin D  1,25 dihydroxy  Lumbar radiculopathy Assessment & Plan: Encouraged moist heat and gentle stretching as tolerated. May try NSAIDs and prescription meds as directed and report if symptoms worsen or seek immediate care    Needs flu shot -     Flu vaccine trivalent PF, 6mos and older(Flulaval,Afluria,Fluarix,Fluzone)  Other orders -     Lisinopril ; Take 0.5 tablets (5 mg total) by mouth daily. -     FLUoxetine  HCl; Take 1 capsule (40 mg total) by mouth daily.  Dispense: 90 capsule; Refill: 1    Assessment and Plan Assessment & Plan Adult Wellness Visit Discussion on immunizations including flu, COVID, shingles, RSV, and pneumococcal vaccines. She is eligible for flu and COVID vaccines today. Discussed the benefits of receiving vaccines at a medical facility versus a pharmacy. She has hypertension, which may qualify her for RSV and pneumococcal vaccines at a pharmacy. Discussed the importance of hydration and protein intake to maintain steady blood pressure and prevent dehydration. Discussed that shingles vaccine shows 90%  effectiveness even at 10 years, RSV immunity holds for at least 5 years, and pneumococcal vaccine may last 10 years or longer. - Administer flu vaccine today. - Recommend COVID vaccine at pharmacy. - Recommend shingles vaccine at pharmacy. - Discuss RSV and pneumococcal vaccines at pharmacy. - Advise hydration with 5 ounces of water every hour. - Advise protein intake every 3-4 hours.  Essential hypertension Blood pressure readings have been low, with recent readings of 100/60 and 112/66. Discussed the potential need to adjust medication due to weight loss and lower blood pressure readings. Emphasized the importance of hydration and protein intake to maintain steady blood pressure. - Reduce lisinopril  dose to 5 mg by splitting current 10 mg tablets. - Monitor blood pressure and symptoms.  Depression with anxiety Current treatment with fluoxetine  20 mg has not resulted in improvement after 3-4 months. Discussed options of increasing fluoxetine  dose or switching to venlafaxine. Decided to increase fluoxetine  to 40 mg as she tolerates it well. Discussed potential future switch to venlafaxine if no improvement is seen. - Increase fluoxetine  to 40 mg daily. - Monitor for improvement over the next 6-12 weeks. - Consider venlafaxine if no improvement after 8 weeks.  Recording duration: 21 minutes     Harlene Horton, MD

## 2024-07-07 NOTE — Patient Instructions (Signed)
 Shingrix is the new shingles shot, 2 shots over 2-6 months, confirm coverage with insurance and document, then can return here for shots with nurse appt or at pharmacy    Prevnar 20 vaccine  RSV, Respiratory Syncital Virus vaccine, Arexvy vaccine

## 2024-07-08 ENCOUNTER — Ambulatory Visit: Payer: Self-pay | Admitting: Family Medicine

## 2024-07-11 LAB — VITAMIN D 1,25 DIHYDROXY
Vitamin D 1, 25 (OH)2 Total: 20 pg/mL (ref 18–72)
Vitamin D2 1, 25 (OH)2: <8 pg/mL
Vitamin D3 1, 25 (OH)2: 20 pg/mL

## 2024-07-25 ENCOUNTER — Other Ambulatory Visit: Payer: Self-pay | Admitting: Family Medicine

## 2024-07-25 ENCOUNTER — Encounter: Payer: Self-pay | Admitting: Family Medicine

## 2024-07-25 MED ORDER — OXYCODONE HCL 20 MG PO TABS: 1.0000 | ORAL_TABLET | Freq: Four times a day (QID) | ORAL | 0 refills | Status: DC | PRN

## 2024-07-25 NOTE — Telephone Encounter (Signed)
 Requesting: oxycodone  20mg   Contract: 03/10/23 UDS: 02/25/24 Last Visit:07/07/24 Next Visit:  08/29/24 Last Refill: 06/27/24 #120 and 0RF   Please Advise

## 2024-08-01 ENCOUNTER — Other Ambulatory Visit (HOSPITAL_BASED_OUTPATIENT_CLINIC_OR_DEPARTMENT_OTHER): Payer: Self-pay

## 2024-08-01 MED ORDER — SHINGRIX 50 MCG/0.5ML IM SUSR
0.5000 mL | Freq: Once | INTRAMUSCULAR | 0 refills | Status: AC
  Filled 2024-08-01: qty 0.5, 1d supply, fill #0

## 2024-08-03 ENCOUNTER — Other Ambulatory Visit: Payer: Self-pay | Admitting: Family Medicine

## 2024-08-03 ENCOUNTER — Encounter: Payer: Self-pay | Admitting: Family Medicine

## 2024-08-03 MED ORDER — FENTANYL 50 MCG/HR TD PT72: 1.0000 | MEDICATED_PATCH | TRANSDERMAL | 0 refills | Status: DC

## 2024-08-05 ENCOUNTER — Telehealth: Payer: Self-pay | Admitting: Family Medicine

## 2024-08-05 NOTE — Telephone Encounter (Signed)
 Yes- fentanyl  patches.

## 2024-08-05 NOTE — Telephone Encounter (Signed)
 Copied from CRM #[id number]. Topic: Clinical - Prescription Issue >> Aug 05, 2024  2:52 PM Ashley R wrote: Reason for CRM: 50 mcg too strong, request to keep at 25mcg. Awaiting mychart message to confirm.

## 2024-08-07 ENCOUNTER — Other Ambulatory Visit: Payer: Self-pay | Admitting: Family Medicine

## 2024-08-09 ENCOUNTER — Other Ambulatory Visit: Payer: Self-pay | Admitting: Family Medicine

## 2024-08-09 MED ORDER — FENTANYL 25 MCG/HR TD PT72: 1.0000 | MEDICATED_PATCH | TRANSDERMAL | 0 refills | Status: DC

## 2024-08-10 NOTE — Telephone Encounter (Signed)
 Called pharmacy and prescription has been cancelled.

## 2024-08-24 ENCOUNTER — Other Ambulatory Visit: Payer: Self-pay | Admitting: Family Medicine

## 2024-08-24 NOTE — Telephone Encounter (Signed)
 Requesting: alprazolam  0.25mg  Contract: 03/10/23 UDS: 02/25/24 Last Visit: 07/07/24 Next Visit: 08/29/24 Last Refill: 05/09/24 #60 and 2RF   Please Advise

## 2024-08-28 NOTE — Assessment & Plan Note (Signed)
 Encouraged DASH or MIND diet, decrease po intake and increase exercise as tolerated. Needs 7-8 hours of sleep nightly. Avoid trans fats, eat small, frequent meals every 4-5 hours with lean proteins, complex carbs and healthy fats. Minimize simple carbs, high fat foods and processed foods

## 2024-08-28 NOTE — Assessment & Plan Note (Signed)
 Supplement and monitor

## 2024-08-28 NOTE — Progress Notes (Unsigned)
 MyChart Video Visit    Virtual Visit via Video Note   This patient is at least at moderate risk for complications without adequate follow up. This format is felt to be most appropriate for this patient at this time. Physical exam was limited by quality of the video and audio technology used for the visit. *** was able to get the patient set up on a video visit.  Patient location: *** Patient and provider in visit Provider location: Office  I discussed the limitations of evaluation and management by telemedicine and the availability of in person appointments. The patient expressed understanding and agreed to proceed.  Visit Date: 08/29/2024  Today's healthcare provider: Harlene Horton, MD     Subjective:    Patient ID: [name] [name], female    DOB: 1961/03/13, 63 y.o.   MRN: [id number]  No chief complaint on file.   HPI Discussed the use of AI scribe software for clinical note transcription with the patient, who gave verbal consent to proceed.  History of Present Illness     Past Medical History:  Diagnosis Date  . Acute foot pain, right 01/13/2017  . Acute upper respiratory infections of unspecified site 06/12/2014  . Anxiety and depression    chronic  . Breast cancer screening 09/05/2016  . Cervical cancer screening 09/05/2016  . Chronic back pain   . Depression with anxiety 08/23/2008   Qualifier: Diagnosis of  By: Viviann Raddle MD, Marsha SAUNDERS   . Gout 05/05/2017  . History of colon polyps   . History of fibromyalgia    suspected   . Hypertension   . Insomnia   . Knee pain, right 06/24/2015  . Meningitis spinal   . Morbid obesity (HCC)   . Ovarian cyst, bilateral   . Preventative health care 06/24/2015  . PTSD (post-traumatic stress disorder)   . Vitamin D  deficiency 06/24/2015    Past Surgical History:  Procedure Laterality Date  . COLONOSCOPY    . OVARIAN CYST REMOVAL      Family History  Problem Relation Age of Onset  . Colon cancer Mother   . Breast  cancer Mother        benign  . Rheum arthritis Father   . Diabetes Father   . Congestive Heart Failure Father   . Hypertension Father   . Stroke Father   . Hip fracture Maternal Grandmother   . COPD Maternal Grandfather 61  . Colon cancer Maternal Grandfather   . Lymphoma Paternal Grandmother   . Colon cancer Maternal Aunt   . Colon cancer Maternal Uncle 83  . Breast cancer Cousin        negative stage 3  . Breast cancer Maternal Great-grandmother   . ADD / ADHD Neg Hx   . Depression Neg Hx   . Alcohol abuse Neg Hx   . Drug abuse Neg Hx   . Esophageal cancer Neg Hx     Social History   Socioeconomic History  . Marital status: Divorced    Spouse name: Not on file  . Number of children: Not on file  . Years of education: Not on file  . Highest education level: Associate degree: academic program  Occupational History  . Not on file  Tobacco Use  . Smoking status: Former    Current packs/day: 0.00    Types: Cigarettes    Quit date: 04/04/1989    Years since quitting: 35.4  . Smokeless tobacco: Never  Vaping Use  . Vaping  status: Never Used  Substance and Sexual Activity  . Alcohol use: Yes    Comment: occassionally- 2 glassses of wine 2x/month  . Drug use: No    Comment: take meds as prescribed  . Sexual activity: Not Currently  Other Topics Concern  . Not on file  Social History Narrative  . Not on file   Social Drivers of Health   Financial Resource Strain: High Risk (12/25/2023)   Overall Financial Resource Strain (CARDIA)   . Difficulty of Paying Living Expenses: Very hard  Food Insecurity: Food Insecurity Present (12/25/2023)   Hunger Vital Sign   . Worried About Programme Researcher, Broadcasting/film/video in the Last Year: Sometimes true   . Ran Out of Food in the Last Year: Never true  Transportation Needs: No Transportation Needs (12/25/2023)   PRAPARE - Transportation   . Lack of Transportation (Medical): No   . Lack of Transportation (Non-Medical): No  Physical Activity:  Insufficiently Active (12/25/2023)   Exercise Vital Sign   . Days of Exercise per Week: 4 days   . Minutes of Exercise per Session: 20 min  Stress: No Stress Concern Present (12/25/2023)   [employer] of Occupational Health - Occupational Stress Questionnaire   . Feeling of Stress : Only a little  Social Connections: Moderately Integrated (12/25/2023)   Social Connection and Isolation Panel   . Frequency of Communication with Friends and Family: More than three times a week   . Frequency of Social Gatherings with Friends and Family: Twice a week   . Attends Religious Services: More than 4 times per year   . Active Member of Clubs or Organizations: Yes   . Attends Banker Meetings: More than 4 times per year   . Marital Status: Never married  Intimate Partner Violence: Not on file    Outpatient Medications Prior to Visit  Medication Sig Dispense Refill  . allopurinol  (ZYLOPRIM ) 100 MG tablet Take 1 tablet (100 mg total) by mouth 2 (two) times daily. 180 tablet 1  . ALPRAZolam  (XANAX ) 0.25 MG tablet TAKE ONE TABLET BY MOUTH TWICE A DAY AS NEEDED FOR ANXIETY 60 tablet 2  . atorvastatin  (LIPITOR) 40 MG tablet TAKE ONE TABLET BY MOUTH ONE TIME DAILY 90 tablet 3  . beclomethasone (BECONASE-AQ) 42 MCG/SPRAY nasal spray Place 1 spray into both nostrils 2 (two) times daily. Dose is for each nostril. 25 g 1  . cholecalciferol (VITAMIN D3) 25 MCG (1000 UNIT) tablet Take 2,000 Units by mouth daily.    . colchicine  0.6 MG tablet TAKE 2 TABLETS BY MOUTH ONCE THEN 1 TABLET BY MOUTH EVERY 2 HOURS AS NEEDED PAIN TIL PAIN GONE, MAXIMUM OF 6 TABLETS IN 24 HOURS OR INTOLERABLE DIARRHEA 6 tablet 2  . cyclobenzaprine  (FLEXERIL ) 10 MG tablet TAKE ONE-HALF TO ONE TABLET BY MOUTH TWICE DAILY AS NEEDED FOR MUSCLE SPASM 40 tablet 2  . fentaNYL  (DURAGESIC ) 25 MCG/HR Place 1 patch onto the skin every 3 (three) days. 10 patch 0  . FLUoxetine  (PROZAC ) 40 MG capsule Take 1 capsule (40 mg total) by mouth  daily. 90 capsule 1  . lisinopril  (ZESTRIL ) 10 MG tablet Take 0.5 tablets (5 mg total) by mouth daily.    . metoprolol  succinate (TOPROL -XL) 25 MG 24 hr tablet Take 1 tablet (25 mg total) by mouth daily. 90 tablet 1  . ondansetron  (ZOFRAN -ODT) 8 MG disintegrating tablet 8mg  ODT q4 hours prn nausea 10 tablet 0  . Oxycodone  HCl 20 MG TABS Take 1 tablet (  20 mg total) by mouth 4 (four) times daily as needed. 120 tablet 0  . traZODone  (DESYREL ) 100 MG tablet Take 1 tablet (100 mg total) by mouth at bedtime as needed for sleep. 90 tablet 1   No facility-administered medications prior to visit.    Allergies  Allergen Reactions  . Codeine Nausea And Vomiting  . Cymbalta [Duloxetine Hcl] Other (See Comments)    Headaches  . Fiorinal [Butalbital-Aspirin-Caffeine] Other (See Comments)    Mental Clarity.  . Wellbutrin  [Bupropion ] Other (See Comments)    dizziness    Review of Systems  Constitutional:  Negative for fever and malaise/fatigue.  HENT:  Negative for congestion.   Eyes:  Negative for blurred vision.  Respiratory:  Negative for shortness of breath.   Cardiovascular:  Negative for chest pain, palpitations and leg swelling.  Gastrointestinal:  Negative for abdominal pain, blood in stool and nausea.  Genitourinary:  Negative for dysuria and frequency.  Musculoskeletal:  Negative for falls.  Skin:  Negative for rash.  Neurological:  Negative for dizziness, loss of consciousness and headaches.  Endo/Heme/Allergies:  Negative for environmental allergies.  Psychiatric/Behavioral:  Negative for depression. The patient is not nervous/anxious.        Objective:    Physical Exam Constitutional:      General: She is not in acute distress.    Appearance: Normal appearance. She is not ill-appearing or toxic-appearing.  HENT:     Head: Normocephalic and atraumatic.     Right Ear: External ear normal.     Left Ear: External ear normal.     Nose: Nose normal.  Eyes:     General:         Right eye: No discharge.        Left eye: No discharge.  Pulmonary:     Effort: Pulmonary effort is normal.  Skin:    Findings: No rash.  Neurological:     Mental Status: She is alert and oriented to person, place, and time.  Psychiatric:        Behavior: Behavior normal.    There were no vitals taken for this visit. Wt Readings from Last 3 Encounters:  07/07/24 205 lb 6.4 oz (93.2 kg)  05/23/24 209 lb (94.8 kg)  02/25/24 215 lb 3.2 oz (97.6 kg)       Assessment & Plan:  Vitamin D  deficiency Assessment & Plan: Supplement and monitor    Obesity, unspecified class, unspecified obesity type, unspecified whether serious comorbidity present Assessment & Plan: Encouraged DASH or MIND diet, decrease po intake and increase exercise as tolerated. Needs 7-8 hours of sleep nightly. Avoid trans fats, eat small, frequent meals every 4-5 hours with lean proteins, complex carbs and healthy fats. Minimize simple carbs, high fat foods and processed foods       Assessment and Plan Assessment & Plan      I discussed the assessment and treatment plan with the patient. The patient was provided an opportunity to ask questions and all were answered. The patient agreed with the plan and demonstrated an understanding of the instructions.   The patient was advised to call back or seek an in-person evaluation if the symptoms worsen or if the condition fails to improve as anticipated.  Harlene Horton, MD [facility] at [facility] [phone] (phone) [phone] (fax)  [facility]

## 2024-08-29 ENCOUNTER — Other Ambulatory Visit: Payer: Self-pay

## 2024-08-29 ENCOUNTER — Telehealth: Admitting: Family Medicine

## 2024-08-29 ENCOUNTER — Encounter: Payer: Self-pay | Admitting: Family Medicine

## 2024-08-29 DIAGNOSIS — I10 Essential (primary) hypertension: Secondary | ICD-10-CM

## 2024-08-29 DIAGNOSIS — E559 Vitamin D deficiency, unspecified: Secondary | ICD-10-CM | POA: Diagnosis not present

## 2024-08-29 DIAGNOSIS — E669 Obesity, unspecified: Secondary | ICD-10-CM

## 2024-08-29 MED ORDER — OXYCODONE HCL 20 MG PO TABS: 1.0000 | ORAL_TABLET | Freq: Four times a day (QID) | ORAL | 0 refills | Status: DC | PRN

## 2024-08-29 MED ORDER — LISINOPRIL 5 MG PO TABS: 5.0000 mg | ORAL_TABLET | Freq: Every day | ORAL | 1 refills | Status: DC

## 2024-08-29 NOTE — Telephone Encounter (Signed)
 Initial Comment Caller states that she is needing a refill for Oxycodone  and it is gonna be due on Sunday. Translation No Nurse Assessment Nurse: Mavis, RN, Fonda Date/Time Titus Time): 08/26/2024 1:39:02 PM Confirm and document reason for call. If symptomatic, describe symptoms. ---Patient states she is going to run out of her oxycodone  on Sunday, requesting a refill. Advised patient controlled substances are not handled after hours, and the client directive states no medications after hours. Advised caller to call back with any new/ worsening symptoms, but advised we would only be able to advise her to go to [facility] or ED as needed until client office opens on Monday. Patient verbalized understanding and agreement, states she already has an appointment scheduled with her provider for this coming Monday morning. Patient denied any symptoms at this point, she still has some medication left. Does the patient have any new or worsening symptoms? ---No Nurse: Mavis, RN, Fonda Date/Time (Eastern Time): 08/26/2024 1:40:43 PM You have opened med assessment, do you wish to continue? ---Yes Please select the assessment type ---Request for controlled medication refill Is there an on-call physician for the client? ---Yes Do the client directives specifically allow for paging the on-call regarding scheduled drugs? ---No Disp. Time Titus Time) Disposition Final User 08/26/2024 1:14:47 PM Send To Nurse Acey Constant, RN, Windy PLEASE NOTE: All timestamps contained within this report are represented as Eastern Standard Time. CONFIDENTIALTY NOTICE: This fax transmission is intended only for the addressee. It contains information that is legally privileged, confidential or otherwise protected from use or disclosure. If you are not the intended recipient, you are strictly prohibited from reviewing, disclosing, copying using or disseminating any of this information or taking any action in  reliance on or regarding this information. If you have received this fax in error, please notify us  immediately by telephone so that we can arrange for its return to us . Phone: [phone], Toll-Free: [phone], Fax: [phone] [facility] 02-03-1961 Page: 1 of2 CallId: [id number] Disp. Time Titus Time) Disposition Final User 08/26/2024 2:40:59 PM Clinical Call Yes Mavis RN, Fonda Final Disposition 08/26/2024 2:40:59 PM Clinical Call Yes Mavis, RN, Fonda

## 2024-08-29 NOTE — Telephone Encounter (Signed)
 Requesting: oxycodone   Contract: 03/10/23 UDS:  02/25/24 Last Visit: 07/07/24 Next Visit: 08/29/24 Last Refill: 07/25/24 #120 and 0RF   Please Advise

## 2024-08-31 ENCOUNTER — Encounter: Payer: Self-pay | Admitting: Family Medicine

## 2024-09-16 ENCOUNTER — Encounter: Payer: Self-pay | Admitting: Family Medicine

## 2024-09-18 ENCOUNTER — Other Ambulatory Visit: Payer: Self-pay | Admitting: Family Medicine

## 2024-09-18 MED ORDER — OXYCODONE HCL 20 MG PO TABS: 1.0000 | ORAL_TABLET | Freq: Four times a day (QID) | ORAL | 0 refills | Status: DC | PRN

## 2024-09-18 MED ORDER — FENTANYL 25 MCG/HR TD PT72: 1.0000 | MEDICATED_PATCH | TRANSDERMAL | 0 refills | Status: DC

## 2024-09-27 ENCOUNTER — Other Ambulatory Visit: Payer: Self-pay | Admitting: Family Medicine

## 2024-09-30 ENCOUNTER — Other Ambulatory Visit: Payer: Self-pay

## 2024-09-30 NOTE — Telephone Encounter (Signed)
 Copied from CRM #[id number]. Topic: Clinical - Prescription Issue >> Sep 28, 2024  1:02 PM [name] [name] wrote: Reason for CRM: Pt was unable to pick up full amount of fentanyl  patches. Was prescribed 10 patches, but they would only give her 2. Patient is unsure why and pharmacy did not provide clear answer, but they said they might need a new prescription. Pt is wondering if a prior shara is needed. She says she is trying to stretch out the 2 patches to last her but she is out.  fentaNYL  (DURAGESIC ) 25 MCG/HR [facility] [address] - [place], [place] - [address], [place] [address] Phone: [phone]  Fax: [phone]   Please return patient's call at [phone]

## 2024-09-30 NOTE — Telephone Encounter (Signed)
 Requesting: Fentanyl  patches Contract: None UDS: 02/25/24 Last Visit: 07/07/2024 Next Visit: 01/23/2025 Last Refill: 09/18/2024  Please Advise

## 2024-09-30 NOTE — Telephone Encounter (Signed)
 Pt called in to check the status of this refill request to find out what's going on. Please call pt to discuss and advise of next steps.

## 2024-10-01 MED ORDER — FENTANYL 25 MCG/HR TD PT72: 1.0000 | MEDICATED_PATCH | TRANSDERMAL | 0 refills | Status: AC

## 2024-10-05 ENCOUNTER — Other Ambulatory Visit (HOSPITAL_COMMUNITY): Payer: Self-pay

## 2024-10-05 ENCOUNTER — Telehealth: Payer: Self-pay

## 2024-10-05 ENCOUNTER — Telehealth: Payer: Self-pay | Admitting: *Deleted

## 2024-10-05 NOTE — Telephone Encounter (Signed)
 Copied from CRM [id number]. Topic: Clinical - Medication Prior Auth >> Oct 05, 2024 10:09 AM Rea BROCKS wrote: Reason for CRM: Patient picked up fentanyl  patches from the drug store. She was supposed to get 10, they only gave her 2 patches for a seven day supply. She thinks a prior authoirization is needed.   Patient said another refill was supposed to be sent in and a prior authorization was supposed to be sent in. But she hasn't heard from Dr. Domenica. Patient thinks a prior approval is needed.   [facility] [address] - [place], [place] - [address], [place] [address] Phone: [phone]  Fax: [phone]   [phone] (M) Patient contact

## 2024-10-05 NOTE — Telephone Encounter (Signed)
 Please do prior auth for fentanyl  to get 30 day supply.

## 2024-10-05 NOTE — Telephone Encounter (Signed)
 Patient notified to not pick up current rx because she will need a prior auth.  Her prior auth ended on 08/28/24.  Her ins only pays for 7days.  Message sent to prior auth team to run prior auth.

## 2024-10-05 NOTE — Telephone Encounter (Signed)
 Pharmacy Patient Advocate Encounter   Received notification from Physician's Office that prior authorization for fentaNYL  25MCG/HR 72 hr patches is required/requested.   Insurance verification completed.   The patient is insured through [facility] MEDICAID.   Per test claim: PA required; PA started via CoverMyMeds. KEY [id number] . Waiting for clinical questions to populate.

## 2024-10-13 ENCOUNTER — Other Ambulatory Visit (HOSPITAL_COMMUNITY): Payer: Self-pay

## 2024-10-13 NOTE — Telephone Encounter (Signed)
 Pharmacy Patient Advocate Encounter  Received notification from [facility] MEDICAID that Prior Authorization for fentaNYL  25MCG/HR 72 hr patches has been APPROVED from 10/06/2024 to 01/04/2025. Unable to obtain price due to refill too soon rejection, last fill date 10/06/2024 next available fill date 10/31/2024   PA #/Case ID/Reference #: [id number]

## 2024-10-18 ENCOUNTER — Encounter: Payer: Self-pay | Admitting: Family Medicine

## 2024-10-18 MED ORDER — OXYCODONE HCL 20 MG PO TABS: 1.0000 | ORAL_TABLET | Freq: Four times a day (QID) | ORAL | 0 refills | Status: AC | PRN

## 2024-10-18 NOTE — Telephone Encounter (Signed)
 Requesting: Oxycodone  HCl 20 MG Contract: No UDS: 02/25/2024 Last Visit: 08/29/2024 Next Visit: 01/23/2025 Last Refill: 09/18/2024  Please Advise

## 2024-10-19 NOTE — Telephone Encounter (Signed)
 PT is scheduled, she is asking about her rx.

## 2024-10-20 ENCOUNTER — Other Ambulatory Visit: Payer: Self-pay | Admitting: Family

## 2024-10-20 ENCOUNTER — Telehealth: Payer: Self-pay

## 2024-10-20 NOTE — Telephone Encounter (Signed)
 Copied from CRM #[id number]. Topic: Clinical - Medication Question >> Oct 20, 2024 12:41 PM Drema MATSU wrote: Reason for CRM: Pt wanted to know if she can get a early refill on Oxycodone  HCl 20 MG TABS instead of refilling it on Sunday due to the weather. She wants pcp to call pharmacy so they can let her get it early.

## 2024-10-20 NOTE — Telephone Encounter (Signed)
 Please advise.

## 2024-10-21 ENCOUNTER — Other Ambulatory Visit (HOSPITAL_COMMUNITY): Payer: Self-pay

## 2024-10-21 NOTE — Telephone Encounter (Signed)
 Spoke w/ [facility]- gave okay to go ahead and release oxycodone  with winter storm coming.

## 2024-10-24 ENCOUNTER — Telehealth: Payer: Self-pay

## 2024-10-24 ENCOUNTER — Other Ambulatory Visit (HOSPITAL_COMMUNITY): Payer: Self-pay

## 2024-10-24 NOTE — Telephone Encounter (Signed)
 Pharmacy Patient Advocate Encounter  Received notification from [facility] MEDICAID that Prior Authorization for Oxycodone  has been APPROVED from 10/24/2024 to 04/23/2025. Ran test claim, Copay is $4.00. This test claim was processed through [facility]- copay amounts may vary at other pharmacies due to pharmacy/plan contracts, or as the patient moves through the different stages of their insurance plan.   PA #/Case ID/Reference #: [id number]

## 2024-10-24 NOTE — Telephone Encounter (Signed)
 Pharmacy Patient Advocate Encounter   Received notification from Physician's Office that prior authorization for Oxycodone  is required/requested.   Insurance verification completed.   The patient is insured through [facility] MEDICAID.   Per test claim: PA required; PA submitted to above mentioned insurance via Latent Key/confirmation #/EOC [id number] Status is pending

## 2024-10-24 NOTE — Telephone Encounter (Signed)
 Looks like still waiting on PA.   Copied from CRM #[id number]. Topic: Clinical - Prescription Issue >> Oct 21, 2024  4:38 PM Viola F wrote: Reason for CRM: Patient wants to make sure that a new prescription will be prescribed after getting prior authorization approval for the Oxycodone  HCl 20 MG TABS [[id number]]

## 2024-10-28 ENCOUNTER — Other Ambulatory Visit: Payer: Self-pay | Admitting: Family Medicine

## 2025-01-23 ENCOUNTER — Encounter: Admitting: Family Medicine

## 2025-01-24 ENCOUNTER — Encounter: Admitting: Student

## 2025-02-23 ENCOUNTER — Encounter: Admitting: Student
